# Patient Record
Sex: Male | Born: 1940 | Race: White | Hispanic: No | Marital: Married | Smoking: Former smoker
Health system: Southern US, Community
[De-identification: ages and names within clinical notes are randomized; demographics above are authoritative.]

## PROBLEM LIST (undated history)

## (undated) DIAGNOSIS — R7303 Prediabetes: Secondary | ICD-10-CM

## (undated) DIAGNOSIS — L723 Sebaceous cyst: Secondary | ICD-10-CM

## (undated) DIAGNOSIS — F0280 Dementia in other diseases classified elsewhere without behavioral disturbance: Secondary | ICD-10-CM

## (undated) DIAGNOSIS — I5189 Other ill-defined heart diseases: Secondary | ICD-10-CM

## (undated) DIAGNOSIS — Z7901 Long term (current) use of anticoagulants: Secondary | ICD-10-CM

## (undated) DIAGNOSIS — G20A1 Parkinson's disease without dyskinesia, without mention of fluctuations: Secondary | ICD-10-CM

## (undated) DIAGNOSIS — D3500 Benign neoplasm of unspecified adrenal gland: Secondary | ICD-10-CM

## (undated) DIAGNOSIS — I4729 Other ventricular tachycardia: Secondary | ICD-10-CM

## (undated) DIAGNOSIS — K828 Other specified diseases of gallbladder: Secondary | ICD-10-CM

## (undated) DIAGNOSIS — N400 Enlarged prostate without lower urinary tract symptoms: Secondary | ICD-10-CM

## (undated) DIAGNOSIS — I951 Orthostatic hypotension: Secondary | ICD-10-CM

## (undated) DIAGNOSIS — R42 Dizziness and giddiness: Secondary | ICD-10-CM

## (undated) DIAGNOSIS — R001 Bradycardia, unspecified: Secondary | ICD-10-CM

## (undated) DIAGNOSIS — I499 Cardiac arrhythmia, unspecified: Secondary | ICD-10-CM

## (undated) DIAGNOSIS — K760 Fatty (change of) liver, not elsewhere classified: Secondary | ICD-10-CM

## (undated) DIAGNOSIS — I447 Left bundle-branch block, unspecified: Secondary | ICD-10-CM

## (undated) DIAGNOSIS — I441 Atrioventricular block, second degree: Secondary | ICD-10-CM

## (undated) DIAGNOSIS — F0150 Vascular dementia without behavioral disturbance: Secondary | ICD-10-CM

## (undated) DIAGNOSIS — G20 Parkinson's disease: Secondary | ICD-10-CM

## (undated) DIAGNOSIS — I10 Essential (primary) hypertension: Secondary | ICD-10-CM

## (undated) DIAGNOSIS — G3184 Mild cognitive impairment, so stated: Secondary | ICD-10-CM

## (undated) DIAGNOSIS — E538 Deficiency of other specified B group vitamins: Secondary | ICD-10-CM

## (undated) DIAGNOSIS — I2510 Atherosclerotic heart disease of native coronary artery without angina pectoris: Secondary | ICD-10-CM

## (undated) DIAGNOSIS — R918 Other nonspecific abnormal finding of lung field: Secondary | ICD-10-CM

## (undated) DIAGNOSIS — J45909 Unspecified asthma, uncomplicated: Secondary | ICD-10-CM

## (undated) DIAGNOSIS — I4891 Unspecified atrial fibrillation: Secondary | ICD-10-CM

## (undated) DIAGNOSIS — I700 Atherosclerosis of aorta: Secondary | ICD-10-CM

## (undated) DIAGNOSIS — G629 Polyneuropathy, unspecified: Secondary | ICD-10-CM

## (undated) DIAGNOSIS — I472 Ventricular tachycardia: Secondary | ICD-10-CM

## (undated) DIAGNOSIS — E785 Hyperlipidemia, unspecified: Secondary | ICD-10-CM

## (undated) DIAGNOSIS — G2581 Restless legs syndrome: Secondary | ICD-10-CM

## (undated) DIAGNOSIS — F329 Major depressive disorder, single episode, unspecified: Secondary | ICD-10-CM

## (undated) DIAGNOSIS — Z8614 Personal history of Methicillin resistant Staphylococcus aureus infection: Secondary | ICD-10-CM

## (undated) DIAGNOSIS — Z972 Presence of dental prosthetic device (complete) (partial): Secondary | ICD-10-CM

## (undated) DIAGNOSIS — I6523 Occlusion and stenosis of bilateral carotid arteries: Secondary | ICD-10-CM

## (undated) DIAGNOSIS — Z9181 History of falling: Secondary | ICD-10-CM

## (undated) DIAGNOSIS — D649 Anemia, unspecified: Secondary | ICD-10-CM

## (undated) DIAGNOSIS — Z974 Presence of external hearing-aid: Secondary | ICD-10-CM

## (undated) DIAGNOSIS — M1990 Unspecified osteoarthritis, unspecified site: Secondary | ICD-10-CM

## (undated) DIAGNOSIS — F32A Depression, unspecified: Secondary | ICD-10-CM

## (undated) HISTORY — DX: Atherosclerotic heart disease of native coronary artery without angina pectoris: I25.10

## (undated) HISTORY — DX: Other ventricular tachycardia: I47.29

## (undated) HISTORY — DX: Bradycardia, unspecified: R00.1

## (undated) HISTORY — DX: Long term (current) use of anticoagulants: Z79.01

## (undated) HISTORY — DX: Other specified diseases of gallbladder: K82.8

## (undated) HISTORY — DX: Other ill-defined heart diseases: I51.89

## (undated) HISTORY — DX: Dizziness and giddiness: R42

## (undated) HISTORY — DX: Ventricular tachycardia: I47.2

## (undated) HISTORY — DX: Dementia in other diseases classified elsewhere, unspecified severity, without behavioral disturbance, psychotic disturbance, mood disturbance, and anxiety: F02.80

## (undated) HISTORY — DX: Hyperlipidemia, unspecified: E78.5

## (undated) HISTORY — DX: Left bundle-branch block, unspecified: I44.7

## (undated) HISTORY — DX: Personal history of Methicillin resistant Staphylococcus aureus infection: Z86.14

## (undated) HISTORY — DX: Cardiac arrhythmia, unspecified: I49.9

## (undated) HISTORY — DX: Orthostatic hypotension: I95.1

## (undated) HISTORY — DX: Benign prostatic hyperplasia without lower urinary tract symptoms: N40.0

## (undated) HISTORY — DX: Occlusion and stenosis of bilateral carotid arteries: I65.23

## (undated) HISTORY — DX: Sebaceous cyst: L72.3

## (undated) HISTORY — DX: Essential (primary) hypertension: I10

## (undated) HISTORY — DX: Benign neoplasm of unspecified adrenal gland: D35.00

---
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
## 2005-08-22 ENCOUNTER — Ambulatory Visit: Payer: Self-pay | Admitting: Family Medicine

## 2006-08-24 ENCOUNTER — Ambulatory Visit: Payer: Self-pay | Admitting: Internal Medicine

## 2006-09-05 ENCOUNTER — Ambulatory Visit: Payer: Self-pay | Admitting: Internal Medicine

## 2006-12-26 HISTORY — PX: COLONOSCOPY: SHX174

## 2007-05-25 ENCOUNTER — Ambulatory Visit: Payer: Self-pay | Admitting: Internal Medicine

## 2007-06-07 ENCOUNTER — Ambulatory Visit: Payer: Self-pay | Admitting: Internal Medicine

## 2007-06-07 ENCOUNTER — Encounter: Payer: Self-pay | Admitting: Internal Medicine

## 2011-05-13 NOTE — Assessment & Plan Note (Signed)
[facility]                           GASTROENTEROLOGY OFFICE NOTE   NAME:HARRISLew, Prout                          MRN:          [id number]  DATE:08/24/2006                            DOB:          25-Jun-1941    HISTORY:  Mr. Prestia is a very nice 70 year old gentleman who is here today  to discuss colorectal screening. He is asymptomatic. He has a positive  family history of colon cancer in the paternal grandfather who had the  disease at age of 47. He, himself, is having regular bowel habits and denies  any rectal bleeding.  On occasion, he has rectal irritation which he  attributes to hemorrhoids.  He also has had some leakage from the rectum at  times when his rectum is irritated.   MEDICATIONS:  1. Atenolol 25 mg p.o. b.i.d.  2. Advicor 50 mg p.o. q.h.s.   PAST MEDICAL HISTORY:  Heart rhythm problems.   FAMILY HISTORY:  Heart disease in father.   SOCIAL HISTORY:  Married, two children. He is a Engineer, maintenance (IT), works in  Insurance account manager. The patient drinks alcohol socially but does not smoke.   REVIEW OF SYSTEMS:  Positive for allergies and also he complains of hearing  problems.   PHYSICAL EXAMINATION:  VITAL SIGNS:  Blood pressure 98/68, pulse 60 and  weight 237 pounds.  GENERAL:  He was alert, oriented in no distress.  HEENT:  Sclerae is nonicteric.  Oral cavity normal.  NECK:  Neck was supple without adenopathy.  LUNGS:  Clear to auscultation.  CARDIAC:  Normal S1, S2.  ABDOMEN:  Soft, nontender with normoactive bowel sounds at costal margin.  No tenderness or fullness.  RECTAL:  Rectal exam showed normal perianal area.  Rectal tone was normal.  There was no tenderness or pain on digital exam. Stool was brown, trace  hemoccult positive. The patient had some irritation within the rectal  ampulla on digital exam but I could not feel any mass.   IMPRESSION:  A 70 year old white male who has trace heme positive stool  which he claims is  from the rectal irritation. He has never had a  colonoscopy. There is some indirect relatives with colon cancer,  specifically his grandfather. I suggested that he have a screening  colonoscopy. The patient's insurance, which is [employer],  currently has a deductible of $5000.00. He prefers to do his colonoscopy  when he becomes Medicare which will be in April, 2008. He would like to wait  with his colonoscopy until next April.   I have discussed the significance of heme positive stool with the patient.  Since he is asymptomatic, we are going to go ahead and obtain stool  hemoccult.  I have given him Analpram cream to use for his rectum to  minimize the irritation. If his hemoccult's are positive, we will go ahead  with colonoscopy immediately. If his hemoccult's are negative, he has an  option of waiting until his Medicare age, which would be in April, 2008. He  is aware of the slight risk of waiting, but he feels that  it would be more  cost effective. I have put his name in the computer to obtain a recall  letter for April, 2008, just in case he does not return his hemoccult card.                                   Hedwig Morton. Juanda Chance, MD   DMB/MedQ  DD:  08/24/2006  DT:  08/24/2006  Job #:  [id number]

## 2012-03-08 ENCOUNTER — Encounter: Payer: Self-pay | Admitting: Internal Medicine

## 2012-05-02 ENCOUNTER — Encounter: Payer: Self-pay | Admitting: Internal Medicine

## 2012-06-22 ENCOUNTER — Encounter: Payer: Self-pay | Admitting: Internal Medicine

## 2012-06-22 ENCOUNTER — Ambulatory Visit (AMBULATORY_SURGERY_CENTER): Payer: Medicare Other | Admitting: *Deleted

## 2012-06-22 VITALS — Ht 70.0 in | Wt 228.7 lb

## 2012-06-22 DIAGNOSIS — Z1211 Encounter for screening for malignant neoplasm of colon: Secondary | ICD-10-CM

## 2012-06-22 MED ORDER — MOVIPREP 100 G PO SOLR: ORAL | Status: DC

## 2012-07-06 ENCOUNTER — Ambulatory Visit (AMBULATORY_SURGERY_CENTER): Payer: Medicare Other | Admitting: Internal Medicine

## 2012-07-06 ENCOUNTER — Encounter: Payer: Self-pay | Admitting: Internal Medicine

## 2012-07-06 VITALS — BP 112/55 | HR 52 | Temp 97.1°F | Resp 18 | Ht 70.0 in | Wt 228.0 lb

## 2012-07-06 DIAGNOSIS — D126 Benign neoplasm of colon, unspecified: Secondary | ICD-10-CM

## 2012-07-06 DIAGNOSIS — K621 Rectal polyp: Secondary | ICD-10-CM

## 2012-07-06 DIAGNOSIS — Z1211 Encounter for screening for malignant neoplasm of colon: Secondary | ICD-10-CM

## 2012-07-06 DIAGNOSIS — K620 Anal polyp: Secondary | ICD-10-CM

## 2012-07-06 DIAGNOSIS — Z860100 Personal history of colon polyps, unspecified: Secondary | ICD-10-CM

## 2012-07-06 MED ORDER — SODIUM CHLORIDE 0.9 % IV SOLN: 500.0000 mL | INTRAVENOUS | Status: DC

## 2012-07-06 NOTE — Progress Notes (Signed)
Patient did not experience any of the following events: a burn prior to discharge; a fall within the facility; wrong site/side/patient/procedure/implant event; or a hospital transfer or hospital admission upon discharge from the facility. (G8907) Patient did not have preoperative order for IV antibiotic SSI prophylaxis. (G8918)  

## 2012-07-06 NOTE — Progress Notes (Signed)
Propofol per j nulty crna. See scanned intra procedure report. ewm 

## 2012-07-06 NOTE — Patient Instructions (Addendum)
DISCHARGE INSTRUCTIONS GIVEN WITH VERBAL UNDERSTANDING. HANDOUT ON POLYPS GIVEN. RESUME PREVIOUS MEDICATIONS. YOU HAD AN ENDOSCOPIC PROCEDURE TODAY AT THE [facility]: Refer to the procedure report that was given to you for any specific questions about what was found during the examination.  If the procedure report does not answer your questions, please call your gastroenterologist to clarify.  If you requested that your care partner not be given the details of your procedure findings, then the procedure report has been included in a sealed envelope for you to review at your convenience later.  YOU SHOULD EXPECT: Some feelings of bloating in the abdomen. Passage of more gas than usual.  Walking can help get rid of the air that was put into your GI tract during the procedure and reduce the bloating. If you had a lower endoscopy (such as a colonoscopy or flexible sigmoidoscopy) you may notice spotting of blood in your stool or on the toilet paper. If you underwent a bowel prep for your procedure, then you may not have a normal bowel movement for a few days.  DIET: Your first meal following the procedure should be a light meal and then it is ok to progress to your normal diet.  A half-sandwich or bowl of soup is an example of a good first meal.  Heavy or fried foods are harder to digest and may make you feel nauseous or bloated.  Likewise meals heavy in dairy and vegetables can cause extra gas to form and this can also increase the bloating.  Drink plenty of fluids but you should avoid alcoholic beverages for 24 hours.  ACTIVITY: Your care partner should take you home directly after the procedure.  You should plan to take it easy, moving slowly for the rest of the day.  You can resume normal activity the day after the procedure however you should NOT DRIVE or use heavy machinery for 24 hours (because of the sedation medicines used during the test).    SYMPTOMS TO REPORT IMMEDIATELY: A  gastroenterologist can be reached at any hour.  During normal business hours, 8:30 AM to 5:00 PM Monday through Friday, call [phone].  After hours and on weekends, please call the GI answering service at [phone] who will take a message and have the physician on call contact you.   Following lower endoscopy (colonoscopy or flexible sigmoidoscopy):  Excessive amounts of blood in the stool  Significant tenderness or worsening of abdominal pains  Swelling of the abdomen that is new, acute  Fever of 100F or higher  FOLLOW UP: If any biopsies were taken you will be contacted by phone or by letter within the next 1-3 weeks.  Call your gastroenterologist if you have not heard about the biopsies in 3 weeks.  Our staff will call the home number listed on your records the next business day following your procedure to check on you and address any questions or concerns that you may have at that time regarding the information given to you following your procedure. This is a courtesy call and so if there is no answer at the home number and we have not heard from you through the emergency physician on call, we will assume that you have returned to your regular daily activities without incident.  SIGNATURES/CONFIDENTIALITY: You and/or your care partner have signed paperwork which will be entered into your electronic medical record.  These signatures attest to the fact that that the information above on your After Visit Summary has  been reviewed and is understood.  Full responsibility of the confidentiality of this discharge information lies with you and/or your care-partner.  

## 2012-07-06 NOTE — Op Note (Signed)
[facility] [address] [place], [place]  [address]  COLONOSCOPY PROCEDURE REPORT  PATIENT:  Stephen Stein, Stephen Stein  MR#:  [id number] BIRTHDATE:  [birth year], 71 yrs. old  GENDER:  male ENDOSCOPIST:  Hedwig Morton. Juanda Chance, MD REF. BY:  Dennison Mascot, M.D. PROCEDURE DATE:  07/06/2012 PROCEDURE:  Colonoscopy with biopsy and snare polypectomy ASA CLASS:  Class II INDICATIONS:  history of pre-cancerous (adenomatous) colon polyps adenomatous polyp 2008 MEDICATIONS:   MAC sedation, administered by CRNA, propofol (Diprivan) 300 mg  DESCRIPTION OF PROCEDURE:   After the risks and benefits and of the procedure were explained, informed consent was obtained. Digital rectal exam was performed and revealed no rectal masses. The LB [id number] endoscope was introduced through the anus and advanced to the cecum, which was identified by both the appendix and ileocecal valve.  The quality of the prep was good, using MoviPrep.  The instrument was then slowly withdrawn as the colon was fully examined.  FINDINGS:  Two polyps were found in the sigmoid colon. 5 mm polyp at 60 cm and 3 mm polyp in the rectum The polyp was removed using cold biopsy forceps. Polyp was snared without cautery. Retrieval was successful. snare polyp   Retroflexed views in the rectum revealed no abnormalities.    The scope was then withdrawn from the patient and the procedure completed.  COMPLICATIONS:  None ENDOSCOPIC IMPRESSION: 1) Two polyps in the sigmoid colon RECOMMENDATIONS: 1) Await pathology results 2) High fiber diet.  REPEAT EXAM:  In 10 year(s) for.  ______________________________ Hedwig Morton. Juanda Chance, MD  CC:  n. eSIGNED:   Hedwig Morton. Edras Wilford at 07/06/2012 11:52 AM  Lara Mulch, [phone]

## 2012-07-09 ENCOUNTER — Telehealth: Payer: Self-pay | Admitting: *Deleted

## 2012-07-09 NOTE — Telephone Encounter (Signed)
  Follow up Call-  Call back number 07/06/2012  Post procedure Call Back phone  # [phone]  Permission to leave phone message Yes     Patient questions:  Do you have a fever, pain , or abdominal swelling? no Pain Score  0 *  Have you tolerated food without any problems? yes  Have you been able to return to your normal activities? yes  Do you have any questions about your discharge instructions: Diet   no Medications  no Follow up visit  no  Do you have questions or concerns about your Care? no  Actions: * If pain score is 4 or above: No action needed, pain <4.  Pt was unavailable.  Spoke with wife Stefano Trulson.

## 2012-07-10 ENCOUNTER — Encounter: Payer: Self-pay | Admitting: Internal Medicine

## 2014-08-19 ENCOUNTER — Ambulatory Visit: Payer: Self-pay | Admitting: Family Medicine

## 2015-04-08 ENCOUNTER — Emergency Department: Admit: 2015-04-08 | Disposition: A | Discharge status: Home / Self Care | Payer: Self-pay | Admitting: Internal Medicine

## 2015-06-15 ENCOUNTER — Telehealth: Payer: Self-pay | Admitting: Family Medicine

## 2015-06-15 NOTE — Telephone Encounter (Signed)
PT IS IN [place] AT THIS TIME AND WILL NOT BE BACK TILL July AND HE IS IN NEED OF A REFILL FOR HIS ATENOLOL. HE HAS AN APPT ON July (THE FIRST WEEK HE THINKS) THIS NEEDS TO BE CALLED IN TO THE [facility] IN [place] AND THE NUMBER IS [phone].

## 2015-06-16 NOTE — Telephone Encounter (Signed)
Called in 21 day supply to [facility] to last pt until his appt on 07/07/15. Please call and inform pt.

## 2015-06-16 NOTE — Telephone Encounter (Signed)
LFT MESS THAT RX HAS BEEN CALLED IN.

## 2015-07-07 ENCOUNTER — Ambulatory Visit (INDEPENDENT_AMBULATORY_CARE_PROVIDER_SITE_OTHER): Payer: Commercial Managed Care - HMO | Admitting: Family Medicine

## 2015-07-07 ENCOUNTER — Encounter: Payer: Self-pay | Admitting: Family Medicine

## 2015-07-07 VITALS — BP 110/60 | HR 58 | Temp 97.7°F | Resp 18 | Ht 70.0 in | Wt 209.4 lb

## 2015-07-07 DIAGNOSIS — I10 Essential (primary) hypertension: Secondary | ICD-10-CM | POA: Diagnosis not present

## 2015-07-07 DIAGNOSIS — R739 Hyperglycemia, unspecified: Secondary | ICD-10-CM | POA: Insufficient documentation

## 2015-07-07 DIAGNOSIS — E785 Hyperlipidemia, unspecified: Secondary | ICD-10-CM | POA: Insufficient documentation

## 2015-07-07 DIAGNOSIS — E669 Obesity, unspecified: Secondary | ICD-10-CM

## 2015-07-07 LAB — POCT GLYCOSYLATED HEMOGLOBIN (HGB A1C): HEMOGLOBIN A1C: 5.6

## 2015-07-07 LAB — GLUCOSE, POCT (MANUAL RESULT ENTRY): POC Glucose: 92 mg/dl (ref 70–99)

## 2015-07-07 MED ORDER — LOVASTATIN 20 MG PO TABS: 20.0000 mg | ORAL_TABLET | Freq: Every day | ORAL | Status: DC

## 2015-07-07 MED ORDER — ATENOLOL 25 MG PO TABS: 25.0000 mg | ORAL_TABLET | Freq: Two times a day (BID) | ORAL | Status: DC

## 2015-07-07 NOTE — Progress Notes (Signed)
Name: Stephen Stein   MRN: [id number]    DOB: 1941-11-21   Date:07/07/2015       Progress Note  Subjective  Chief Complaint  Chief Complaint  Patient presents with  . Hypertension  . Hyperlipidemia    Hypertension This is a chronic problem. The current episode started more than 1 year ago. The problem is unchanged. The problem is controlled. Associated symptoms include anxiety. Pertinent negatives include no blurred vision, chest pain, headaches, neck pain, orthopnea, palpitations or shortness of breath. There are no associated agents to hypertension. Risk factors for coronary artery disease include dyslipidemia, male gender and obesity. Past treatments include beta blockers. The current treatment provides moderate improvement.  Hyperlipidemia This is a chronic problem. The current episode started more than 1 year ago. The problem is controlled. Recent lipid tests were reviewed and are normal. Exacerbating diseases include obesity. Factors aggravating his hyperlipidemia include fatty foods. Pertinent negatives include no chest pain, focal weakness, myalgias or shortness of breath. Current antihyperlipidemic treatment includes statins and herbal therapy. The current treatment provides moderate improvement of lipids. There are no compliance problems.  Risk factors for coronary artery disease include dyslipidemia, hypertension, male sex and obesity.   Hyperglycemia  Patient has a history of elevated glucose at 108 on 11/15. A1c is slightly elevated at 5.6 the past. Currently no polyuria polydipsia polyphagia he is doing some exercising but is not particularly following  an ADA diet  Osteoarthritis  subjective complaint of arthritic pain in several joints. Seizures and renal improvement.  Past Medical History  Diagnosis Date  . Irregular heartbeat     been taking Atenolol for 20 years  . BPH (benign prostatic hyperplasia)   . Hyperlipidemia   . Hypertension     History  Substance Use  Topics  . Smoking status: Former Smoker    Quit date: 06/22/1992  . Smokeless tobacco: Former Systems developer    Quit date: 06/22/1992  . Alcohol Use: 0.0 oz/week    0 Standard drinks or equivalent per week     Comment: occasional     Current outpatient prescriptions:  .  aspirin 81 MG tablet, Take 81 mg by mouth daily., Disp: , Rfl:  .  atenolol (TENORMIN) 25 MG tablet, Take 1 tablet (25 mg total) by mouth 2 (two) times daily., Disp: 90 tablet, Rfl: 1 .  fish oil-omega-3 fatty acids 1000 MG capsule, Take 1 g by mouth daily., Disp: , Rfl:  .  lovastatin (MEVACOR) 20 MG tablet, Take 1 tablet (20 mg total) by mouth at bedtime., Disp: 90 tablet, Rfl: 1 .  Multiple Vitamin (MULTIVITAMIN) tablet, Take 1 tablet by mouth daily., Disp: , Rfl:  .  Saw Palmetto, Serenoa repens, (SAW PALMETTO PO), Take by mouth daily., Disp: , Rfl:   No Known Allergies  Review of Systems  Constitutional: Negative for fever, chills and weight loss.  HENT: Negative for congestion, hearing loss, sore throat and tinnitus.   Eyes: Negative for blurred vision, double vision and redness.  Respiratory: Negative for cough, hemoptysis and shortness of breath.   Cardiovascular: Negative for chest pain, palpitations, orthopnea, claudication and leg swelling.  Gastrointestinal: Negative for heartburn, nausea, vomiting, diarrhea, constipation and blood in stool.  Genitourinary: Negative for dysuria, urgency, frequency and hematuria.  Musculoskeletal: Positive for joint pain. Negative for myalgias, back pain, falls and neck pain.  Skin: Negative for itching.  Neurological: Negative for dizziness, tingling, tremors, focal weakness, seizures, loss of consciousness, weakness and headaches.  Endo/Heme/Allergies: Does not  bruise/bleed easily.  Psychiatric/Behavioral: Negative for depression and substance abuse. The patient is not nervous/anxious and does not have insomnia.      Objective  Filed Vitals:   07/07/15 0739  BP: 110/60   Pulse: 58  Temp: 97.7 F (36.5 C)  TempSrc: Oral  Resp: 18  Height: 5\' 10"  (1.778 m)  Weight: 209 lb 6.4 oz (94.983 kg)  SpO2: 97%     Physical Exam  Constitutional: He is oriented to person, place, and time and well-developed, well-nourished, and in no distress.  HENT:  Head: Normocephalic.  Eyes: EOM are normal. Pupils are equal, round, and reactive to light.  Neck: Normal range of motion. Neck supple. No thyromegaly present.  Cardiovascular: Normal rate, regular rhythm and normal heart sounds.   No murmur heard. Pulmonary/Chest: Effort normal and breath sounds normal. No respiratory distress. He has no wheezes.  Abdominal: Soft. Bowel sounds are normal.  Musculoskeletal: Normal range of motion. He exhibits no edema.  Lymphadenopathy:    He has no cervical adenopathy.  Neurological: He is alert and oriented to person, place, and time. No cranial nerve deficit. Gait normal. Coordination normal.  Skin: Skin is warm and dry. No rash noted.  Psychiatric: Affect and judgment normal.      Assessment & Plan  1. Hyperlipemia Lipid panel today stable - lovastatin (MEVACOR) 20 MG tablet; Take 1 tablet (20 mg total) by mouth at bedtime.  Dispense: 90 tablet; Refill: 1 - Lipid panel  2. Essential hypertension  - atenolol (TENORMIN) 25 MG tablet; Take 1 tablet (25 mg total) by mouth 2 (two) times daily.  Dispense: 90 tablet; Refill: 1 - Comprehensive metabolic panel  3. Hyperglycemia Encourage low-carb diet including protein - POCT glucose (manual entry) - POCT glycosylated hemoglobin (Hb A1C) - Comprehensive metabolic panel  4. Obesity Encourage regular exercise and low-carb diet with lean protein - TSH

## 2015-07-07 NOTE — Patient Instructions (Signed)

## 2015-07-08 LAB — LIPID PANEL
CHOLESTEROL TOTAL: 137 mg/dL (ref 100–199)
Chol/HDL Ratio: 3.6 ratio units (ref 0.0–5.0)
HDL: 38 mg/dL — AB (ref >39)
LDL CALC: 82 mg/dL (ref 0–99)
Triglycerides: 85 mg/dL (ref 0–149)
VLDL CHOLESTEROL CAL: 17 mg/dL (ref 5–40)

## 2015-07-08 LAB — COMPREHENSIVE METABOLIC PANEL
A/G RATIO: 1.6 (ref 1.1–2.5)
ALT: 27 IU/L (ref 0–44)
AST: 30 IU/L (ref 0–40)
Albumin: 4.4 g/dL (ref 3.5–4.8)
Alkaline Phosphatase: 24 IU/L — ABNORMAL LOW (ref 39–117)
BUN / CREAT RATIO: 14 (ref 10–22)
BUN: 16 mg/dL (ref 8–27)
Bilirubin Total: 0.4 mg/dL (ref 0.0–1.2)
CALCIUM: 9.6 mg/dL (ref 8.6–10.2)
CHLORIDE: 100 mmol/L (ref 97–108)
CO2: 22 mmol/L (ref 18–29)
CREATININE: 1.13 mg/dL (ref 0.76–1.27)
GFR calc Af Amer: 74 mL/min/{1.73_m2} (ref >59)
GFR calc non Af Amer: 64 mL/min/{1.73_m2} (ref >59)
GLUCOSE: 110 mg/dL — AB (ref 65–99)
Globulin, Total: 2.7 g/dL (ref 1.5–4.5)
Potassium: 4.9 mmol/L (ref 3.5–5.2)
Sodium: 140 mmol/L (ref 134–144)
TOTAL PROTEIN: 7.1 g/dL (ref 6.0–8.5)

## 2015-07-08 LAB — TSH: TSH: 3.45 u[IU]/mL (ref 0.450–4.500)

## 2015-07-10 ENCOUNTER — Telehealth: Payer: Self-pay | Admitting: Emergency Medicine

## 2015-07-10 NOTE — Telephone Encounter (Signed)
Patient notified of lab results

## 2015-10-02 ENCOUNTER — Telehealth: Payer: Self-pay | Admitting: Family Medicine

## 2015-10-02 NOTE — Telephone Encounter (Signed)
Patient stated that he only received 90 tablets of the atenolol when he was here on 07/07/15 and he takes 2 tablets twice a day. Pt states that he didn't receive the correct amount and is requesting a refill.  His next appointment is on 12/14/15.  Patient uses the [facility] on [address].  Please contact patient once complete.

## 2015-10-04 ENCOUNTER — Other Ambulatory Visit: Payer: Self-pay | Admitting: Family Medicine

## 2015-10-06 NOTE — Telephone Encounter (Signed)
done

## 2015-10-21 ENCOUNTER — Telehealth: Payer: Self-pay | Admitting: Family Medicine

## 2015-10-21 NOTE — Telephone Encounter (Signed)
ERRENOUS °

## 2015-12-14 ENCOUNTER — Ambulatory Visit (INDEPENDENT_AMBULATORY_CARE_PROVIDER_SITE_OTHER): Payer: Commercial Managed Care - HMO | Admitting: Family Medicine

## 2015-12-14 ENCOUNTER — Ambulatory Visit
Admission: RE | Admit: 2015-12-14 | Discharge: 2015-12-14 | Disposition: A | Discharge status: Home / Self Care | Payer: Commercial Managed Care - HMO | Source: Ambulatory Visit | Attending: Family Medicine | Admitting: Family Medicine

## 2015-12-14 ENCOUNTER — Telehealth: Payer: Self-pay | Admitting: *Deleted

## 2015-12-14 ENCOUNTER — Encounter: Payer: Self-pay | Admitting: Family Medicine

## 2015-12-14 VITALS — BP 118/72 | HR 56 | Temp 98.0°F | Resp 18 | Ht 70.0 in | Wt 220.1 lb

## 2015-12-14 DIAGNOSIS — M25511 Pain in right shoulder: Secondary | ICD-10-CM | POA: Diagnosis present

## 2015-12-14 DIAGNOSIS — E785 Hyperlipidemia, unspecified: Secondary | ICD-10-CM

## 2015-12-14 DIAGNOSIS — R739 Hyperglycemia, unspecified: Secondary | ICD-10-CM | POA: Diagnosis not present

## 2015-12-14 DIAGNOSIS — R002 Palpitations: Secondary | ICD-10-CM | POA: Diagnosis not present

## 2015-12-14 DIAGNOSIS — M8580 Other specified disorders of bone density and structure, unspecified site: Secondary | ICD-10-CM | POA: Diagnosis not present

## 2015-12-14 DIAGNOSIS — M25512 Pain in left shoulder: Secondary | ICD-10-CM

## 2015-12-14 DIAGNOSIS — M19011 Primary osteoarthritis, right shoulder: Secondary | ICD-10-CM | POA: Diagnosis not present

## 2015-12-14 DIAGNOSIS — M19012 Primary osteoarthritis, left shoulder: Secondary | ICD-10-CM | POA: Diagnosis not present

## 2015-12-14 LAB — GLUCOSE, POCT (MANUAL RESULT ENTRY): POC Glucose: 101 mg/dl — AB (ref 70–99)

## 2015-12-14 LAB — POCT GLYCOSYLATED HEMOGLOBIN (HGB A1C): HEMOGLOBIN A1C: 5.6

## 2015-12-14 MED ORDER — MELOXICAM 15 MG PO TABS: 15.0000 mg | ORAL_TABLET | Freq: Every day | ORAL | Status: DC

## 2015-12-14 MED ORDER — ATENOLOL 25 MG PO TABS: 25.0000 mg | ORAL_TABLET | Freq: Two times a day (BID) | ORAL | Status: DC

## 2015-12-14 MED ORDER — LOVASTATIN 20 MG PO TABS: 20.0000 mg | ORAL_TABLET | Freq: Every day | ORAL | Status: DC

## 2015-12-14 NOTE — Patient Instructions (Signed)
Hyperglycemia Hyperglycemia occurs when the glucose (sugar) in your blood is too high. Hyperglycemia can happen for many reasons, but it most often happens to people who do not know they have diabetes or are not managing their diabetes properly.  CAUSES  Whether you have diabetes or not, there are other causes of hyperglycemia. Hyperglycemia can occur when you have diabetes, but it can also occur in other situations that you might not be as aware of, such as: Diabetes  If you have diabetes and are having problems controlling your blood glucose, hyperglycemia could occur because of some of the following reasons:  Not following your meal plan.  Not taking your diabetes medications or not taking it properly.  Exercising less or doing less activity than you normally do.  Being sick. Pre-diabetes  This cannot be ignored. Before people develop Type 2 diabetes, they almost always have "pre-diabetes." This is when your blood glucose levels are higher than normal, but not yet high enough to be diagnosed as diabetes. Research has shown that some long-term damage to the body, especially the heart and circulatory system, may already be occurring during pre-diabetes. If you take action to manage your blood glucose when you have pre-diabetes, you may delay or prevent Type 2 diabetes from developing. Stress  If you have diabetes, you may be "diet" controlled or on oral medications or insulin to control your diabetes. However, you may find that your blood glucose is higher than usual in the hospital whether you have diabetes or not. This is often referred to as "stress hyperglycemia." Stress can elevate your blood glucose. This happens because of hormones put out by the body during times of stress. If stress has been the cause of your high blood glucose, it can be followed regularly by your caregiver. That way he/she can make sure your hyperglycemia does not continue to get worse or progress to  diabetes. Steroids  Steroids are medications that act on the infection fighting system (immune system) to block inflammation or infection. One side effect can be a rise in blood glucose. Most people can produce enough extra insulin to allow for this rise, but for those who cannot, steroids make blood glucose levels go even higher. It is not unusual for steroid treatments to "uncover" diabetes that is developing. It is not always possible to determine if the hyperglycemia will go away after the steroids are stopped. A special blood test called an A1c is sometimes done to determine if your blood glucose was elevated before the steroids were started. SYMPTOMS  Thirsty.  Frequent urination.  Dry mouth.  Blurred vision.  Tired or fatigue.  Weakness.  Sleepy.  Tingling in feet or leg. DIAGNOSIS  Diagnosis is made by monitoring blood glucose in one or all of the following ways:  A1c test. This is a chemical found in your blood.  Fingerstick blood glucose monitoring.  Laboratory results. TREATMENT  First, knowing the cause of the hyperglycemia is important before the hyperglycemia can be treated. Treatment may include, but is not be limited to:  Education.  Change or adjustment in medications.  Change or adjustment in meal plan.  Treatment for an illness, infection, etc.  More frequent blood glucose monitoring.  Change in exercise plan.  Decreasing or stopping steroids.  Lifestyle changes. HOME CARE INSTRUCTIONS   Test your blood glucose as directed.  Exercise regularly. Your caregiver will give you instructions about exercise. Pre-diabetes or diabetes which comes on with stress is helped by exercising.  Eat wholesome,   balanced meals. Eat often and at regular, fixed times. Your caregiver or nutritionist will give you a meal plan to guide your sugar intake.  Being at an ideal weight is important. If needed, losing as little as 10 to 15 pounds may help improve blood  glucose levels. SEEK MEDICAL CARE IF:   You have questions about medicine, activity, or diet.  You continue to have symptoms (problems such as increased thirst, urination, or weight gain). SEEK IMMEDIATE MEDICAL CARE IF:   You are vomiting or have diarrhea.  Your breath smells fruity.  You are breathing faster or slower.  You are very sleepy or incoherent.  You have numbness, tingling, or pain in your feet or hands.  You have chest pain.  Your symptoms get worse even though you have been following your caregiver's orders.  If you have any other questions or concerns.   This information is not intended to replace advice given to you by your health care provider. Make sure you discuss any questions you have with your health care provider.   Document Released: 06/07/2001 Document Revised: 03/05/2012 Document Reviewed: 08/18/2015 Elsevier Interactive Patient Education 2016 [employer]. Obesity Obesity is defined as having too much total body fat and a body mass index (BMI) of 30 or more. BMI is an estimate of body fat and is calculated from your height and weight. BMI is typically calculated by your health care provider during regular wellness visits. Obesity happens when you consume more calories than you can burn by exercising or performing daily physical tasks. Prolonged obesity can cause major illnesses or emergencies, such as:  Stroke.  Heart disease.  Diabetes.  Cancer.  Arthritis.  High blood pressure (hypertension).  High cholesterol.  Sleep apnea.  Erectile dysfunction.  Infertility problems. CAUSES   Regularly eating unhealthy foods.  Physical inactivity.  Certain disorders, such as an underactive thyroid (hypothyroidism), Cushing's syndrome, and polycystic ovarian syndrome.  Certain medicines, such as steroids, some depression medicines, and antipsychotics.  Genetics.  Lack of sleep. DIAGNOSIS A health care provider can diagnose obesity after  calculating your BMI. Obesity will be diagnosed if your BMI is 30 or higher. There are other methods of measuring obesity levels. Some other methods include measuring your skinfold thickness, your waist circumference, and comparing your hip circumference to your waist circumference. TREATMENT  A healthy treatment program includes some or all of the following:  Long-term dietary changes.  Exercise and physical activity.  Behavioral and lifestyle changes.  Medicine only under the supervision of your health care provider. Medicines may help, but only if they are used with diet and exercise programs. If your BMI is 40 or higher, your health care provider may recommend specialized surgery or programs to help with weight loss. An unhealthy treatment program includes:  Fasting.  Fad diets.  Supplements and drugs. These choices do not succeed in long-term weight control. HOME CARE INSTRUCTIONS  Exercise and perform physical activity as directed by your health care provider. To increase physical activity, try the following:  Use stairs instead of elevators.  Park farther away from store entrances.  Garden, bike, or walk instead of watching television or using the computer.  Eat healthy, low-calorie foods and drinks on a regular basis. Eat more fruits and vegetables. Use low-calorie cookbooks or take healthy cooking classes.  Limit fast food, sweets, and processed snack foods.  Eat smaller portions.  Keep a daily journal of everything you eat. There are many free websites to help you with this.  It may be helpful to measure your foods so you can determine if you are eating the correct portion sizes.  Avoid drinking alcohol. Drink more water and drinks without calories.  Take vitamins and supplements only as recommended by your health care provider.  Weight-loss support groups, Tax adviser, counselors, and stress reduction education can also be very helpful. SEEK IMMEDIATE  MEDICAL CARE IF:  You have chest pain or tightness.  You have trouble breathing or feel short of breath.  You have weakness or leg numbness.  You feel confused or have trouble talking.  You have sudden changes in your vision.   This information is not intended to replace advice given to you by your health care provider. Make sure you discuss any questions you have with your health care provider.   Document Released: 01/19/2005 Document Revised: 01/02/2015 Document Reviewed: 01/18/2012 Elsevier Interactive Patient Education [employer].

## 2015-12-14 NOTE — Progress Notes (Signed)
Name: Stephen Stein   MRN: [id number]    DOB: December 20, 1941   Date:12/14/2015       Progress Note  Subjective  Chief Complaint  Chief Complaint  Patient presents with  . Hypertension    6 month recheck  . Hyperlipidemia    HPI  Hypertension   Patient presents for follow-up of hypertension. It has been present for over 5years.  Patient states that there is compliance with medical regimen which consists of ATENOLOL . There is no end organ disease. Cardiac risk factors include hypertension hyperlipidemia and diabetes.  Exercise regimen consist of LIMITED WALKING .  Diet consist of UNHEALTHT.  Hyperlipidemia  Patient has a history of hyperlipidemia for >5 years.  Current medical regimen consist of LOVASTATIN AND FISH OIL .  Compliance is GOOD .  Diet and exercise are currently followed POORLY.  Risk factors for cardiovascular disease include hyperlipidemia, OBESITY, HTN, SEDENTERY LIFESTYLE MALE> 50 .   There have been no side effects from the medication.    Hyperglycemia  Patient has a history of hyperglycemia with glucose 110 and an A1c of 5.66 months ago. No polyuria polydipsia polyphagia. He is obese but is never been diagnosed with diabetes.  Obesity  Patient has continued regular continuous weight gain of additional 11 pounds last visit. He has not been following his diet and exercising as frequently as he has in the past. No polyuria polydipsia polyphagia. He is not taking the following a low-fat low-cholesterol diet.  Bilateral shoulder pai  subjective okay Past Medical History  Diagnosis Date  . Irregular heartbeat     been taking Atenolol for 20 years  . BPH (benign prostatic hyperplasia)   . Hyperlipidemia   . Hypertension     Social History  Substance Use Topics  . Smoking status: Former Smoker    Quit date: 06/22/1992  . Smokeless tobacco: Former Systems developer    Quit date: 06/22/1992  . Alcohol Use: 0.0 oz/week    0 Standard drinks or equivalent per week     Comment:  occasional     Current outpatient prescriptions:  .  aspirin 81 MG tablet, Take 81 mg by mouth daily., Disp: , Rfl:  .  atenolol (TENORMIN) 25 MG tablet, Take 1 tablet (25 mg total) by mouth 2 (two) times daily., Disp: 180 tablet, Rfl: 1 .  fish oil-omega-3 fatty acids 1000 MG capsule, Take 1 g by mouth daily., Disp: , Rfl:  .  lovastatin (MEVACOR) 20 MG tablet, Take 1 tablet (20 mg total) by mouth at bedtime., Disp: 90 tablet, Rfl: 1 .  Multiple Vitamin (MULTIVITAMIN) tablet, Take 1 tablet by mouth daily., Disp: , Rfl:  .  Saw Palmetto, Serenoa repens, (SAW PALMETTO PO), Take by mouth daily., Disp: , Rfl:   No Known Allergies  Review of Systems  Constitutional: Negative for fever, chills and weight loss.  HENT: Negative for congestion, hearing loss, sore throat and tinnitus.   Eyes: Negative for blurred vision, double vision and redness.  Respiratory: Negative for cough, hemoptysis and shortness of breath.   Cardiovascular: Negative for chest pain, palpitations, orthopnea, claudication and leg swelling.  Gastrointestinal: Negative for heartburn, nausea, vomiting, diarrhea, constipation and blood in stool.  Genitourinary: Positive for frequency. Negative for dysuria, urgency and hematuria.  Musculoskeletal: Negative for myalgias, back pain, joint pain, falls and neck pain.  Skin: Negative for itching.  Neurological: Negative for dizziness, tingling, tremors, focal weakness, seizures, loss of consciousness, weakness and headaches.  Endo/Heme/Allergies: Does not bruise/bleed  easily.  Psychiatric/Behavioral: Negative for depression and substance abuse. The patient is not nervous/anxious and does not have insomnia.      Objective  Filed Vitals:   12/14/15 0737  BP: 118/72  Pulse: 56  Temp: 98 F (36.7 C)  TempSrc: Oral  Resp: 18  Height: 5\' 10"  (1.778 m)  Weight: 220 lb 1.6 oz (99.837 kg)  SpO2: 96%     Physical Exam  Constitutional: He is oriented to person, place, and  time and well-developed, well-nourished, and in no distress.  HENT:  Head: Normocephalic.  Eyes: EOM are normal. Pupils are equal, round, and reactive to light.  Neck: Normal range of motion. Neck supple. No thyromegaly present.  Cardiovascular: Normal rate, regular rhythm and normal heart sounds.   No murmur heard. Pulmonary/Chest: Effort normal and breath sounds normal. No respiratory distress. He has no wheezes.  Abdominal: Soft. Bowel sounds are normal.  Musculoskeletal: Normal range of motion. He exhibits no edema.  Lymphadenopathy:    He has no cervical adenopathy.  Neurological: He is alert and oriented to person, place, and time. No cranial nerve deficit. Gait normal. Coordination normal.  Skin: Skin is warm and dry. No rash noted.  Psychiatric: Affect and judgment normal.      Assessment & Plan  . 1. Hyperlipemia LABS - lovastatin (MEVACOR) 20 MG tablet; Take 1 tablet (20 mg total) by mouth at bedtime.  Dispense: 90 tablet; Refill: 1 - Lipid panel  2. Hyperglycemia - POCT Glucose (CBG) - POCT HgB A1C - Comprehensive Metabolic Panel (CMET)  3. Shoulder pain, bilateral NSIAIDS XRAYS - DG Shoulder Right; Future - DG Shoulder Left; Future  4. Palpitations EKG, LABS - Comprehensive Metabolic Panel (CMET) - TSH - EKG 12-Lead - Ambulatory referral to Cardiology

## 2015-12-14 NOTE — Telephone Encounter (Signed)
Error

## 2015-12-15 LAB — COMPREHENSIVE METABOLIC PANEL
ALBUMIN: 4.4 g/dL (ref 3.5–4.8)
ALK PHOS: 25 IU/L — AB (ref 39–117)
ALT: 28 IU/L (ref 0–44)
AST: 30 IU/L (ref 0–40)
Albumin/Globulin Ratio: 1.6 (ref 1.1–2.5)
BILIRUBIN TOTAL: 0.4 mg/dL (ref 0.0–1.2)
BUN / CREAT RATIO: 12 (ref 10–22)
BUN: 15 mg/dL (ref 8–27)
CO2: 24 mmol/L (ref 18–29)
CREATININE: 1.3 mg/dL — AB (ref 0.76–1.27)
Calcium: 9.4 mg/dL (ref 8.6–10.2)
Chloride: 99 mmol/L (ref 96–106)
GFR calc Af Amer: 63 mL/min/{1.73_m2} (ref >59)
GFR calc non Af Amer: 54 mL/min/{1.73_m2} — ABNORMAL LOW (ref >59)
GLOBULIN, TOTAL: 2.7 g/dL (ref 1.5–4.5)
Glucose: 107 mg/dL — ABNORMAL HIGH (ref 65–99)
Potassium: 4.9 mmol/L (ref 3.5–5.2)
SODIUM: 136 mmol/L (ref 134–144)
TOTAL PROTEIN: 7.1 g/dL (ref 6.0–8.5)

## 2015-12-15 LAB — LIPID PANEL
CHOL/HDL RATIO: 4.5 ratio (ref 0.0–5.0)
CHOLESTEROL TOTAL: 159 mg/dL (ref 100–199)
HDL: 35 mg/dL — ABNORMAL LOW (ref >39)
LDL Calculated: 106 mg/dL — ABNORMAL HIGH (ref 0–99)
Triglycerides: 89 mg/dL (ref 0–149)
VLDL Cholesterol Cal: 18 mg/dL (ref 5–40)

## 2015-12-15 LAB — TSH: TSH: 4.28 u[IU]/mL (ref 0.450–4.500)

## 2015-12-15 NOTE — Telephone Encounter (Signed)
Patient notified of lab results

## 2015-12-16 ENCOUNTER — Ambulatory Visit (INDEPENDENT_AMBULATORY_CARE_PROVIDER_SITE_OTHER): Payer: Commercial Managed Care - HMO | Admitting: Cardiovascular Disease

## 2015-12-16 ENCOUNTER — Ambulatory Visit (INDEPENDENT_AMBULATORY_CARE_PROVIDER_SITE_OTHER)
Admission: RE | Admit: 2015-12-16 | Discharge: 2015-12-16 | Disposition: A | Discharge status: Home / Self Care | Payer: Self-pay | Source: Ambulatory Visit | Attending: Cardiovascular Disease | Admitting: Cardiovascular Disease

## 2015-12-16 ENCOUNTER — Encounter: Payer: Self-pay | Admitting: Cardiovascular Disease

## 2015-12-16 VITALS — BP 118/74 | HR 52 | Ht 70.0 in | Wt 220.2 lb

## 2015-12-16 DIAGNOSIS — E785 Hyperlipidemia, unspecified: Secondary | ICD-10-CM | POA: Diagnosis not present

## 2015-12-16 DIAGNOSIS — E669 Obesity, unspecified: Secondary | ICD-10-CM

## 2015-12-16 DIAGNOSIS — R9431 Abnormal electrocardiogram [ECG] [EKG]: Secondary | ICD-10-CM | POA: Diagnosis not present

## 2015-12-16 DIAGNOSIS — I10 Essential (primary) hypertension: Secondary | ICD-10-CM

## 2015-12-16 DIAGNOSIS — R739 Hyperglycemia, unspecified: Secondary | ICD-10-CM

## 2015-12-16 DIAGNOSIS — R001 Bradycardia, unspecified: Secondary | ICD-10-CM | POA: Insufficient documentation

## 2015-12-16 NOTE — Patient Instructions (Addendum)
You are doing well.  Consider decreasing the atenolol down to one a day in the morning  You are scheduled for a CT Calcium Score today @ 1:15, please arrive @ 1:00 In our [facility] office [address] There is a one-time fee of $150.00 due at the time of your procedure  Coronary Calcium Scan A coronary calcium scan is an imaging test used to look for deposits of calcium and other fatty materials (plaques) in the inner lining of the blood vessels of your heart (coronary arteries). These deposits of calcium and plaques can partly clog and narrow the coronary arteries without producing any symptoms or warning signs. This puts you at risk for a heart attack. This test can detect these deposits before symptoms develop.  LET [facility] CARE PROVIDER KNOW ABOUT:  Any allergies you have.  All medicines you are taking, including vitamins, herbs, eye drops, creams, and over-the-counter medicines.  Previous problems you or members of your family have had with the use of anesthetics.  Any blood disorders you have.  Previous surgeries you have had.  Medical conditions you have.  Possibility of pregnancy, if this applies. RISKS AND COMPLICATIONS Generally, this is a safe procedure. However, as with any procedure, complications can occur. This test involves the use of radiation. Radiation exposure can be dangerous to a pregnant woman and her unborn baby. If you are pregnant, you should not have this procedure done.  BEFORE THE PROCEDURE There is no special preparation for the procedure. PROCEDURE  You will need to undress and put on a hospital gown. You will need to remove any jewelry around your neck or chest.  Sticky electrodes are placed on your chest and are connected to an electrocardiogram (EKG or electrocardiography) machine to recorda tracing of the electrical activity of your heart.  A CT scanner will take pictures of your heart. During this time, you will be asked to  lie still and hold your breath for 2-3 seconds while a picture is being taken of your heart. AFTER THE PROCEDURE   You will be allowed to get dressed.  You can return to your normal activities after the scan is done.   This information is not intended to replace advice given to you by your health care provider. Make sure you discuss any questions you have with your health care provider.   Document Released: 06/09/2008 Document Revised: 12/17/2013 Document Reviewed: 08/19/2013 Elsevier Interactive Patient Education [employer].

## 2015-12-16 NOTE — Assessment & Plan Note (Signed)
Blood pressure stable, heart rate is low Recommended he hold the atenolol in the evening

## 2015-12-16 NOTE — Assessment & Plan Note (Signed)
Heart rate 52 on today's visit, 53 with primary care Recommended he hold atenolol in the evening

## 2015-12-16 NOTE — Assessment & Plan Note (Signed)
Stressed the importance of weight loss Cholesterol has jumped 20 points with his LDL with his weight gain

## 2015-12-16 NOTE — Progress Notes (Signed)
Patient ID: Stephen Stein, male    DOB: 1941-07-30, 74 y.o.   MRN: [id number]  HPI Comments: [name] is a very pleasant 74 -year-old gentleman with prior smoking history, stopped 20 years ago, hyperlipidemia, history of palpitations, treated with atenolol, presenting for abnormal EKG.  He reports that previous attempt to hold his atenolol or unsuccessful, he had palpitations, chest discomfort. Because of this reason he is remained on atenolol for many years with no significant problems. Denies any lightheadedness, dizziness, fatigue. Has not been aware that heart rate runs low.  Generally very active at baseline, no regular exercise program, likes to go camping.  Reports his weight is up 11 pounds over the past year, eating the wrong foods, trying to turn that around. Because of weight gain, sugars have been running mildly high.  EKG on today's visit shows normal sinus rhythm with rate 52 bpm, unable to exclude old anterior MI Prior EKG with primary care shows similar finding of possible old anterior MI, Finding of prolonged QT and T-wave abnormality is not particularly impressive  He reports having stress test 15 or 20 years ago for irregular heartbeats, details unavailable Never had echocardiogram or cardiac catheterization   No Known Allergies  Current Outpatient Prescriptions on File Prior to Visit  Medication Sig Dispense Refill  . atenolol (TENORMIN) 25 MG tablet Take 1 tablet (25 mg total) by mouth 2 (two) times daily. 180 tablet 1  . fish oil-omega-3 fatty acids 1000 MG capsule Take 1 g by mouth daily.    [name] lovastatin (MEVACOR) 20 MG tablet Take 1 tablet (20 mg total) by mouth at bedtime. 90 tablet 1  . meloxicam (MOBIC) 15 MG tablet Take 1 tablet (15 mg total) by mouth daily. 90 tablet 1  . Multiple Vitamin (MULTIVITAMIN) tablet Take 1 tablet by mouth daily.    . Saw Palmetto, Serenoa repens, (SAW PALMETTO PO) Take by mouth daily.     No current facility-administered  medications on file prior to visit.    Past Medical History  Diagnosis Date  . Irregular heartbeat     been taking Atenolol for 20 years  . BPH (benign prostatic hyperplasia)   . Hyperlipidemia   . Hypertension     Past Surgical History  Procedure Laterality Date  . Tonsillectomy and adenoidectomy  1955  . Colonoscopy  2008    Social History  reports that he quit smoking about 23 years ago. He quit smokeless tobacco use about 23 years ago. He reports that he drinks alcohol. He reports that he does not use illicit drugs.  Family History family history includes Cancer in his mother; Hearing loss in his brother and sister; Heart disease in his father. There is no history of Colon cancer or Stomach cancer.    Review of Systems  Constitutional: Negative.   Respiratory: Negative.   Cardiovascular: Negative.   Gastrointestinal: Negative.   Musculoskeletal: Negative.   Neurological: Negative.   Hematological: Negative.   Psychiatric/Behavioral: Negative.   All other systems reviewed and are negative.   BP 118/74 mmHg  Pulse 52  Ht 5\' 10"  (1.778 m)  Wt 220 lb 4 oz (99.905 kg)  BMI 31.60 kg/m2  Physical Exam  Constitutional: He is oriented to person, place, and time. He appears well-developed and well-nourished.  HENT:  Head: Normocephalic.  Nose: Nose normal.  Mouth/Throat: Oropharynx is clear and moist.  Eyes: Conjunctivae are normal. Pupils are equal, round, and reactive to light.  Neck: Normal range of motion.  Neck supple. No JVD present.  Cardiovascular: Normal rate, regular rhythm, normal heart sounds and intact distal pulses.  Exam reveals no gallop and no friction rub.   No murmur heard. Pulmonary/Chest: Effort normal and breath sounds normal. No respiratory distress. He has no wheezes. He has no rales. He exhibits no tenderness.  Abdominal: Soft. Bowel sounds are normal. He exhibits no distension. There is no tenderness.  Musculoskeletal: Normal range of motion.  He exhibits no edema or tenderness.  Lymphadenopathy:    He has no cervical adenopathy.  Neurological: He is alert and oriented to person, place, and time. Coordination normal.  Skin: Skin is warm and dry. No rash noted. No erythema.  Psychiatric: He has a normal mood and affect. His behavior is normal. Judgment and thought content normal.

## 2015-12-16 NOTE — Assessment & Plan Note (Signed)
We have encouraged continued exercise, careful diet management in an effort to lose weight. 

## 2015-12-16 NOTE — Assessment & Plan Note (Signed)
He is asymptomatic, EKG finding may be secondary to lead placement. He does have several risk factors including smoking history, hyperlipidemia, age We have suggested CT coronary calcium score for risk stratification If scores very low, no further testing needed If scores very high, may need stress testing even catheterization Scheduled for today at 1:00pm

## 2015-12-16 NOTE — Assessment & Plan Note (Signed)
Sugars have climbed with recent weight gain, he is trying to get the weight back down

## 2015-12-22 ENCOUNTER — Telehealth: Payer: Self-pay | Admitting: Family Medicine

## 2015-12-22 NOTE — Telephone Encounter (Signed)
Pt would like results of CT scan. Please return his call.

## 2015-12-25 ENCOUNTER — Telehealth: Payer: Self-pay | Admitting: *Deleted

## 2015-12-25 NOTE — Telephone Encounter (Signed)
Results are in Dr. Simon Rhein for review. It will take longer to result since Dr. Rockey Situ is out of the office.

## 2015-12-25 NOTE — Telephone Encounter (Signed)
Pt is out of town but would like to know the results on CT score he did Please call when done.

## 2016-01-01 ENCOUNTER — Other Ambulatory Visit: Payer: Self-pay

## 2016-01-01 MED ORDER — ROSUVASTATIN CALCIUM 20 MG PO TABS: 20.0000 mg | ORAL_TABLET | Freq: Every day | ORAL | Status: DC

## 2016-03-03 ENCOUNTER — Telehealth: Payer: Self-pay | Admitting: Cardiovascular Disease

## 2016-03-03 DIAGNOSIS — R9431 Abnormal electrocardiogram [ECG] [EKG]: Secondary | ICD-10-CM

## 2016-03-03 DIAGNOSIS — I2510 Atherosclerotic heart disease of native coronary artery without angina pectoris: Secondary | ICD-10-CM

## 2016-03-03 NOTE — Telephone Encounter (Signed)
Spoke w/ pt.  He states that he was told to schedule a stress test before his appt, but he is unsure what type he needs. Advised him that I will find out from Dr. Rockey Situ if pt needs a GXT or nuclear stress test and call pt to set this up.

## 2016-03-03 NOTE — Telephone Encounter (Signed)
Spoke w/ pt.  Advised him of Dr. Donivan Scull recommendation.  He is sched for ETT Myoview 3/22 @ 7:30 as he is currently out of town.  Reviewed instructions w/ him and will mail to him, as well.  Asked him to call back w/ any questions or concerns.

## 2016-03-03 NOTE — Telephone Encounter (Signed)
Patient called and wants to schedule his stress test. He will be back in town after 03/09/16. Please call patient.

## 2016-03-03 NOTE — Telephone Encounter (Signed)
For abnormal EKG, coronary artery disease, elevated CT coronary calcium scoring Would order a treadmill Myoview

## 2016-03-16 ENCOUNTER — Encounter
Admission: RE | Admit: 2016-03-16 | Discharge: 2016-03-16 | Disposition: A | Discharge status: Home / Self Care | Payer: PPO | Source: Ambulatory Visit | Attending: Cardiovascular Disease | Admitting: Cardiovascular Disease

## 2016-03-16 DIAGNOSIS — I2510 Atherosclerotic heart disease of native coronary artery without angina pectoris: Secondary | ICD-10-CM | POA: Diagnosis not present

## 2016-03-16 DIAGNOSIS — R9431 Abnormal electrocardiogram [ECG] [EKG]: Secondary | ICD-10-CM

## 2016-03-16 LAB — NM MYOCAR MULTI W/SPECT W/WALL MOTION / EF
CSEPED: 8 min
CSEPPHR: 122 {beats}/min
Estimated workload: 10.1 METS
LV dias vol: 64 mL (ref 62–150)
LV sys vol: 20 mL
NUC STRESS TID: 0.81
Percent HR: 82 %
Rest HR: 53 {beats}/min
SDS: 4
SRS: 0
SSS: 5

## 2016-03-16 MED ORDER — TECHNETIUM TC 99M SESTAMIBI - CARDIOLITE
14.0580 | Freq: Once | INTRAVENOUS | Status: AC | PRN
  Administered 2016-03-16: 08:00:00 14.058 via INTRAVENOUS

## 2016-03-16 MED ORDER — TECHNETIUM TC 99M SESTAMIBI - CARDIOLITE
32.2750 | Freq: Once | INTRAVENOUS | Status: AC | PRN
  Administered 2016-03-16: 32.275 via INTRAVENOUS

## 2016-03-25 ENCOUNTER — Ambulatory Visit (INDEPENDENT_AMBULATORY_CARE_PROVIDER_SITE_OTHER): Payer: PPO | Admitting: Cardiovascular Disease

## 2016-03-25 ENCOUNTER — Encounter: Payer: Self-pay | Admitting: Cardiovascular Disease

## 2016-03-25 VITALS — BP 118/60 | HR 55 | Ht 70.0 in | Wt 221.5 lb

## 2016-03-25 DIAGNOSIS — E669 Obesity, unspecified: Secondary | ICD-10-CM

## 2016-03-25 DIAGNOSIS — I10 Essential (primary) hypertension: Secondary | ICD-10-CM

## 2016-03-25 DIAGNOSIS — I2510 Atherosclerotic heart disease of native coronary artery without angina pectoris: Secondary | ICD-10-CM | POA: Diagnosis not present

## 2016-03-25 DIAGNOSIS — I25118 Atherosclerotic heart disease of native coronary artery with other forms of angina pectoris: Secondary | ICD-10-CM | POA: Insufficient documentation

## 2016-03-25 DIAGNOSIS — E785 Hyperlipidemia, unspecified: Secondary | ICD-10-CM | POA: Diagnosis not present

## 2016-03-25 DIAGNOSIS — R739 Hyperglycemia, unspecified: Secondary | ICD-10-CM

## 2016-03-25 MED ORDER — ROSUVASTATIN CALCIUM 40 MG PO TABS: 40.0000 mg | ORAL_TABLET | Freq: Every day | ORAL | Status: DC

## 2016-03-25 NOTE — Assessment & Plan Note (Signed)
Stressed importance of low glucose levels especially given his underlying coronary artery disease

## 2016-03-25 NOTE — Assessment & Plan Note (Signed)
3 vessel coronary disease, moderate, noted on recent CT coronary calcium scoring, score of 800 Recommended aggressive lipid management, goal LDL less than 70 Long discussion with him today concerning his numbers, CT scan images shown to him Stress test showing no ischemia

## 2016-03-25 NOTE — Progress Notes (Signed)
Patient ID: Stephen Stein, male    DOB: 07-23-1941, 75 y.o.   MRN: [id number]  HPI Comments: Stephen Stein is a very pleasant 54 -year-old gentleman with prior smoking history, stopped 20 years ago, hyperlipidemia, history of palpitations, treated with atenolol, initially presenting with abnormal EKG, had CT coronary calcium score with score around 800, stress testing showing no significant ischemia who presents for routine follow-up to go over  his testing and coronary artery disease   In follow-up, he reports feeling well, no chest pain or shortness of breath on exertion Long discussion concerning his CT coronary calcium score, Images were shown to him in detail in the office, pulled up on computer He has three-vessel coronary artery disease, no significant aortic atherosclerosis  Details of the stress test discussed with him as well showing no significant ischemia.  Long discussion concerning the cholesterol medication, he is tolerating Crestor 20 mg daily  He does have low heart rate on atenolol 25 mill grams in the morning, 12.5 mg in the evening   very active at baseline, no regular exercise program  Other past medical history reviewed Previous EKG, EKG on today's visit shows normal sinus rhythm with rate 52 bpm, unable to exclude old anterior MI Prior EKG with primary care shows similar finding of possible old anterior MI, Finding of prolonged QT and T-wave abnormality is not particularly impressive  He reports having stress test 15 or 20 years ago for irregular heartbeats, details unavailable Never had echocardiogram or cardiac catheterization   No Known Allergies  Current Outpatient Prescriptions on File Prior to Visit  Medication Sig Dispense Refill  . atenolol (TENORMIN) 25 MG tablet Take 1 tablet (25 mg total) by mouth 2 (two) times daily. 180 tablet 1  . fish oil-omega-3 fatty acids 1000 MG capsule Take 1 g by mouth daily.    . meloxicam (MOBIC) 15 MG tablet Take 1 tablet  (15 mg total) by mouth daily. 90 tablet 1  . Multiple Vitamin (MULTIVITAMIN) tablet Take 1 tablet by mouth daily.    . Saw Palmetto, Serenoa repens, (SAW PALMETTO PO) Take by mouth daily.     No current facility-administered medications on file prior to visit.    Past Medical History  Diagnosis Date  . Irregular heartbeat     been taking Atenolol for 20 years  . BPH (benign prostatic hyperplasia)   . Hyperlipidemia   . Hypertension     Past Surgical History  Procedure Laterality Date  . Tonsillectomy and adenoidectomy  1955  . Colonoscopy  2008    Social History  reports that he quit smoking about 23 years ago. He quit smokeless tobacco use about 23 years ago. He reports that he drinks alcohol. He reports that he does not use illicit drugs.  Family History family history includes Cancer in his mother; Hearing loss in his brother and sister; Heart disease in his father. There is no history of Colon cancer or Stomach cancer.    Review of Systems  Constitutional: Negative.   Respiratory: Negative.   Cardiovascular: Negative.   Gastrointestinal: Negative.   Musculoskeletal: Negative.   Neurological: Negative.   Hematological: Negative.   Psychiatric/Behavioral: Negative.   All other systems reviewed and are negative.   BP 118/60 mmHg  Pulse 55  Ht 5\' 10"  (1.778 m)  Wt 221 lb 8 oz (100.472 kg)  BMI 31.78 kg/m2  SpO2 98%  Physical Exam  Constitutional: He is oriented to person, place, and time. He appears well-developed  and well-nourished.  HENT:  Head: Normocephalic.  Nose: Nose normal.  Mouth/Throat: Oropharynx is clear and moist.  Eyes: Conjunctivae are normal. Pupils are equal, round, and reactive to light.  Neck: Normal range of motion. Neck supple. No JVD present.  Cardiovascular: Normal rate, regular rhythm, normal heart sounds and intact distal pulses.  Exam reveals no gallop and no friction rub.   No murmur heard. Pulmonary/Chest: Effort normal and breath  sounds normal. No respiratory distress. He has no wheezes. He has no rales. He exhibits no tenderness.  Abdominal: Soft. Bowel sounds are normal. He exhibits no distension. There is no tenderness.  Musculoskeletal: Normal range of motion. He exhibits no edema or tenderness.  Lymphadenopathy:    He has no cervical adenopathy.  Neurological: He is alert and oriented to person, place, and time. Coordination normal.  Skin: Skin is warm and dry. No rash noted. No erythema.  Psychiatric: He has a normal mood and affect. His behavior is normal. Judgment and thought content normal.

## 2016-03-25 NOTE — Assessment & Plan Note (Signed)
Cholesterol is at goal on the current lipid regimen. No changes to the medications were made. Stay on Crestor 20 mg daily, lipid panel check in 2 months time

## 2016-03-25 NOTE — Assessment & Plan Note (Signed)
We have encouraged continued exercise, careful diet management in an effort to lose weight. 

## 2016-03-25 NOTE — Patient Instructions (Addendum)
You are doing well.  We will check liver and lipids through [facility] (need order to take)  Decrease the atenolol down to one a day in the morning  Aspirin 81 mg daily coated  Please call us if you have new issues that need to be addressed before your next appt.  Your physician wants you to follow-up in: 6 months.  You will receive a reminder letter in the mail two months in advance. If you don't receive a letter, please call our office to schedule the follow-up appointment.

## 2016-03-25 NOTE — Assessment & Plan Note (Signed)
Blood pressure is well controlled on today's visit. No changes made to the medications.   Total encounter time more than 25 minutes  Greater than 50% was spent in counseling and coordination of care with the patient  

## 2016-06-01 ENCOUNTER — Other Ambulatory Visit: Payer: Self-pay | Admitting: Cardiovascular Disease

## 2016-06-01 DIAGNOSIS — E785 Hyperlipidemia, unspecified: Secondary | ICD-10-CM | POA: Diagnosis not present

## 2016-06-02 LAB — HEPATIC FUNCTION PANEL
ALK PHOS: 21 IU/L — AB (ref 39–117)
ALT: 31 IU/L (ref 0–44)
AST: 30 IU/L (ref 0–40)
Albumin: 4.3 g/dL (ref 3.5–4.8)
BILIRUBIN TOTAL: 0.4 mg/dL (ref 0.0–1.2)
BILIRUBIN, DIRECT: 0.13 mg/dL (ref 0.00–0.40)
TOTAL PROTEIN: 6.8 g/dL (ref 6.0–8.5)

## 2016-06-02 LAB — LIPID PANEL W/O CHOL/HDL RATIO
CHOLESTEROL TOTAL: 110 mg/dL (ref 100–199)
HDL: 39 mg/dL — ABNORMAL LOW (ref >39)
LDL CALC: 59 mg/dL (ref 0–99)
TRIGLYCERIDES: 58 mg/dL (ref 0–149)
VLDL Cholesterol Cal: 12 mg/dL (ref 5–40)

## 2016-06-06 ENCOUNTER — Other Ambulatory Visit: Payer: Self-pay

## 2016-06-06 DIAGNOSIS — E785 Hyperlipidemia, unspecified: Secondary | ICD-10-CM

## 2016-06-14 ENCOUNTER — Ambulatory Visit: Payer: Commercial Managed Care - HMO | Admitting: Family Medicine

## 2016-06-29 DIAGNOSIS — H2513 Age-related nuclear cataract, bilateral: Secondary | ICD-10-CM | POA: Diagnosis not present

## 2016-08-17 ENCOUNTER — Other Ambulatory Visit: Payer: Self-pay | Admitting: Cardiovascular Disease

## 2016-09-06 ENCOUNTER — Ambulatory Visit: Payer: Commercial Managed Care - HMO | Admitting: Family Medicine

## 2016-09-08 ENCOUNTER — Other Ambulatory Visit: Payer: Self-pay

## 2016-09-08 ENCOUNTER — Ambulatory Visit (INDEPENDENT_AMBULATORY_CARE_PROVIDER_SITE_OTHER): Payer: PPO | Admitting: Cardiovascular Disease

## 2016-09-08 ENCOUNTER — Encounter: Payer: Self-pay | Admitting: Cardiovascular Disease

## 2016-09-08 ENCOUNTER — Telehealth: Payer: Self-pay | Admitting: Family Medicine

## 2016-09-08 VITALS — BP 146/72 | HR 55 | Ht 70.0 in | Wt 220.0 lb

## 2016-09-08 DIAGNOSIS — I10 Essential (primary) hypertension: Secondary | ICD-10-CM

## 2016-09-08 DIAGNOSIS — I2510 Atherosclerotic heart disease of native coronary artery without angina pectoris: Secondary | ICD-10-CM

## 2016-09-08 DIAGNOSIS — R739 Hyperglycemia, unspecified: Secondary | ICD-10-CM | POA: Diagnosis not present

## 2016-09-08 DIAGNOSIS — E785 Hyperlipidemia, unspecified: Secondary | ICD-10-CM | POA: Diagnosis not present

## 2016-09-08 MED ORDER — ATENOLOL 25 MG PO TABS: 25.0000 mg | ORAL_TABLET | Freq: Two times a day (BID) | ORAL | 2 refills | Status: DC

## 2016-09-08 MED ORDER — ROSUVASTATIN CALCIUM 40 MG PO TABS: 40.0000 mg | ORAL_TABLET | Freq: Every day | ORAL | 3 refills | Status: DC

## 2016-09-08 NOTE — Progress Notes (Signed)
Cardiology Office Note  Date:  09/08/2016   ID:  Stephen Stein, DOB 07/06/1941, MRN [id number]  PCP:  Ashok Norris, MD   Chief Complaint  Patient presents with  . other    6 month follow up. Meds reviewed by the patient verbally. "doing well."     HPI:  Stephen Stein is a very pleasant 75 -year-old gentleman with prior smoking history, stopped 20 years ago, hyperlipidemia, history of palpitations, treated with atenolol, initially presenting with abnormal EKG, had CT coronary calcium score with score around 800, stress testing showing no significant ischemia who presents for routine follow-up to go over  his testing and coronary artery disease   Walks daily, Travels in his RV, was recently up in [place] Denies any significant chest pain or shortness of breath on exertion Tolerating Crestor 20 mg daily, total cholesterol down to 110, LDL 57 Mildly elevated glucose levels typically less than 110 Otherwise no complaints Atenolol decreased on previous office visit for bradycardia, takes 25 mg daily  EKG on today's visit shows normal sinus rhythm with rate 55 bpm, no significant ST or T-wave changes  Other past medical history reviewed CT coronary calcium score, 800 He has three-vessel coronary artery disease, no significant aortic atherosclerosis  Previous stress test showing no significant ischemia.  Previous EKG, EKG on today's visit shows normal sinus rhythm with rate 52 bpm, unable to exclude old anterior MI Prior EKG with primary care shows similar finding of possible old anterior MI, Finding of prolonged QT and T-wave abnormality is not particularly impressive  He reports having stress test 15 or 20 years ago for irregular heartbeats, details unavailable Never had echocardiogram or cardiac catheterization  PMH:   has a past medical history of BPH (benign prostatic hyperplasia); Hyperlipidemia; Hypertension; and Irregular heartbeat.  PSH:    Past Surgical History:   Procedure Laterality Date  . COLONOSCOPY  2008  . TONSILLECTOMY AND ADENOIDECTOMY  1955    Current Outpatient Prescriptions  Medication Sig Dispense Refill  . atenolol (TENORMIN) 25 MG tablet Take 1 tablet (25 mg total) by mouth 2 (two) times daily. 180 tablet 2  . fish oil-omega-3 fatty acids 1000 MG capsule Take 1 g by mouth daily.    . meloxicam (MOBIC) 15 MG tablet Take 1 tablet (15 mg total) by mouth daily. 90 tablet 1  . Multiple Vitamin (MULTIVITAMIN) tablet Take 1 tablet by mouth daily.    . rosuvastatin (CRESTOR) 40 MG tablet Take 1 tablet (40 mg total) by mouth daily. 90 tablet 3  . Saw Palmetto, Serenoa repens, (SAW PALMETTO PO) Take by mouth daily.     No current facility-administered medications for this visit.      Allergies:   Review of patient's allergies indicates no known allergies.   Social History:  The patient  reports that he quit smoking about 24 years ago. He quit smokeless tobacco use about 24 years ago. He reports that he drinks alcohol. He reports that he does not use drugs.   Family History:   family history includes Cancer in his mother; Hearing loss in his brother and sister; Heart disease in his father.    Review of Systems: Review of Systems  Constitutional: Negative.   Respiratory: Negative.   Cardiovascular: Negative.   Gastrointestinal: Negative.   Musculoskeletal: Negative.   Neurological: Negative.   Psychiatric/Behavioral: Negative.   All other systems reviewed and are negative.    PHYSICAL EXAM: VS:  BP (!) 146/72 (BP Location: Left Arm, Patient  Position: Sitting, Cuff Size: Normal)   Pulse (!) 55   Ht 5\' 10"  (1.778 m)   Wt 220 lb (99.8 kg)   BMI 31.57 kg/m  , BMI Body mass index is 31.57 kg/m. GEN: Well nourished, well developed, in no acute distress  HEENT: normal  Neck: no JVD, carotid bruits, or masses Cardiac: RRR; no murmurs, rubs, or gallops,no edema  Respiratory:  clear to auscultation bilaterally, normal work of  breathing GI: soft, nontender, nondistended, + BS MS: no deformity or atrophy  Skin: warm and dry, no rash Neuro:  Strength and sensation are intact Psych: euthymic mood, full affect    Recent Labs: 12/14/2015: BUN 15; Creatinine, Ser 1.30; Potassium 4.9; Sodium 136; TSH 4.280 06/01/2016: ALT 31    Lipid Panel Lab Results  Component Value Date   CHOL 110 06/01/2016   HDL 39 (L) 06/01/2016   LDLCALC 59 06/01/2016   TRIG 58 06/01/2016      Wt Readings from Last 3 Encounters:  09/08/16 220 lb (99.8 kg)  03/25/16 221 lb 8 oz (100.5 kg)  12/16/15 220 lb 4 oz (99.9 kg)       ASSESSMENT AND PLAN:  Coronary artery disease involving native coronary artery of native heart without angina pectoris - Plan: EKG 12-Lead Currently with no symptoms of angina. No further workup at this time. Continue current medication regimen.  Essential hypertension - Plan: EKG 12-Lead Repeat blood pressure systolic in the 123456 range No changes to his medications  Hyperglycemia Recommended low carbohydrate diet, weight loss for borderline sugars  Hyperlipemia Cholesterol is at goal on the current lipid regimen. No changes to the medications were made.   Total encounter time more than 15 minutes  Greater than 50% was spent in counseling and coordination of care with the patient   Disposition:   F/U  12 months   Orders Placed This Encounter  Procedures  . EKG 12-Lead     Signed, Esmond Plants, M.D., Ph.D. 09/08/2016  [place], [place]

## 2016-09-08 NOTE — Patient Instructions (Addendum)
Medication Instructions:   No medication changes made No changes Watch the carbs  Labwork:  No new labs needed  Testing/Procedures:  No further testing at this time   Follow-Up: It was a pleasure seeing you in the office today. Please call us if you have new issues that need to be addressed before your next appt.  [phone]  Your physician wants you to follow-up in: 12 months.  You will receive a reminder letter in the mail two months in advance. If you don't receive a letter, please call our office to schedule the follow-up appointment.  If you need a refill on your cardiac medications before your next appointment, please call your pharmacy.

## 2016-09-11 MED ORDER — MELOXICAM 15 MG PO TABS: 15.0000 mg | ORAL_TABLET | Freq: Every day | ORAL | 0 refills | Status: DC | PRN

## 2016-09-11 NOTE — Telephone Encounter (Signed)
Note from Dr. Rockey Situ; okay for Rx

## 2016-09-14 NOTE — Telephone Encounter (Signed)
done

## 2016-09-27 ENCOUNTER — Ambulatory Visit: Payer: Commercial Managed Care - HMO | Admitting: Family Medicine

## 2016-10-04 ENCOUNTER — Ambulatory Visit (INDEPENDENT_AMBULATORY_CARE_PROVIDER_SITE_OTHER): Payer: PPO | Admitting: Family Medicine

## 2016-10-04 ENCOUNTER — Encounter: Payer: Self-pay | Admitting: Family Medicine

## 2016-10-04 VITALS — BP 124/70 | HR 60 | Temp 98.0°F | Resp 16 | Ht 70.0 in | Wt 226.5 lb

## 2016-10-04 DIAGNOSIS — M19011 Primary osteoarthritis, right shoulder: Secondary | ICD-10-CM | POA: Diagnosis not present

## 2016-10-04 DIAGNOSIS — M19019 Primary osteoarthritis, unspecified shoulder: Secondary | ICD-10-CM | POA: Diagnosis not present

## 2016-10-04 DIAGNOSIS — E6609 Other obesity due to excess calories: Secondary | ICD-10-CM

## 2016-10-04 DIAGNOSIS — I2510 Atherosclerotic heart disease of native coronary artery without angina pectoris: Secondary | ICD-10-CM | POA: Diagnosis not present

## 2016-10-04 DIAGNOSIS — M19012 Primary osteoarthritis, left shoulder: Secondary | ICD-10-CM | POA: Insufficient documentation

## 2016-10-04 DIAGNOSIS — Z6832 Body mass index (BMI) 32.0-32.9, adult: Secondary | ICD-10-CM | POA: Diagnosis not present

## 2016-10-04 DIAGNOSIS — R7989 Other specified abnormal findings of blood chemistry: Secondary | ICD-10-CM | POA: Diagnosis not present

## 2016-10-04 DIAGNOSIS — E782 Mixed hyperlipidemia: Secondary | ICD-10-CM | POA: Diagnosis not present

## 2016-10-04 DIAGNOSIS — R739 Hyperglycemia, unspecified: Secondary | ICD-10-CM | POA: Diagnosis not present

## 2016-10-04 DIAGNOSIS — I10 Essential (primary) hypertension: Secondary | ICD-10-CM

## 2016-10-04 NOTE — Assessment & Plan Note (Signed)
Work on weight loss, 10-15 pounds

## 2016-10-04 NOTE — Assessment & Plan Note (Signed)
Try to keep LDL under 70; limit saturated fats

## 2016-10-04 NOTE — Progress Notes (Signed)
BP 124/70 (BP Location: Right Arm, Patient Position: Sitting, Cuff Size: Large)   Pulse 60   Temp 98 F (36.7 C) (Oral)   Resp 16   Ht 5\' 10"  (1.778 m)   Wt 226 lb 8 oz (102.7 kg)   SpO2 97%   BMI 32.50 kg/m    Subjective:    Patient ID: Stephen Stein, male    DOB: [birth year], 75 y.o.   MRN: [id number]  HPI: Stephen Stein is a 75 y.o. male  Chief Complaint  Patient presents with  . Medication Refill   Patient is new to me; his usual provider is out of the office for an extended time  Patient says Dr. Rutherford Nail did an EKG and then sent him to the cardiologist; he sent him to [facility]; currently no chest pain; he had calcium scoring and nuclear medicine tests; he saw cardiologist and he switched him to Crestor; no problems with muscle aches; he has been trying to eat better; real fell off the wagon last month, spent August in [place], going to [place] in January  Taking saw palmetto for prostate health  HTN; well-controlled on medicine; he has not heard about DASH guidelines yet; has gained some weight on vacation  Elevated glucose in Dec but normal A1c  He takes the meloxicam for arthritis, limited ROM in shoulders; he is not interested in seeing an orthopaedist or doing physical therapy  Depression screen [facility] 2/9 10/04/2016 12/14/2015  Decreased Interest 0 0  Down, Depressed, Hopeless 0 0  PHQ - 2 Score 0 0   Relevant past medical, surgical, family and social history reviewed Past Medical History:  Diagnosis Date  . BPH (benign prostatic hyperplasia)   . Hyperlipidemia   . Hypertension   . Irregular heartbeat    been taking Atenolol for 20 years   Past Surgical History:  Procedure Laterality Date  . COLONOSCOPY  2008  . TONSILLECTOMY AND ADENOIDECTOMY  1955   Family History  Problem Relation Age of Onset  . Cancer Mother   . Heart disease Father   . Hearing loss Sister   . Hearing loss Brother   . Colon cancer Neg Hx   . Stomach cancer Neg Hx    Social  History  Substance Use Topics  . Smoking status: Former Smoker    Quit date: 06/22/1992  . Smokeless tobacco: Former Systems developer    Quit date: 06/22/1992  . Alcohol use 0.0 oz/week     Comment: occasional   Interim medical history since last visit reviewed. Allergies and medications reviewed  Review of Systems  Constitutional: Positive for unexpected weight change (little bit of weight gain on vacation).  Cardiovascular: Negative for chest pain.   Per HPI unless specifically indicated above     Objective:    BP 124/70 (BP Location: Right Arm, Patient Position: Sitting, Cuff Size: Large)   Pulse 60   Temp 98 F (36.7 C) (Oral)   Resp 16   Ht 5\' 10"  (1.778 m)   Wt 226 lb 8 oz (102.7 kg)   SpO2 97%   BMI 32.50 kg/m   Wt Readings from Last 3 Encounters:  10/04/16 226 lb 8 oz (102.7 kg)  09/08/16 220 lb (99.8 kg)  03/25/16 221 lb 8 oz (100.5 kg)    Physical Exam  Constitutional: He appears well-developed and well-nourished. No distress.  HENT:  Head: Normocephalic and atraumatic.  Eyes: EOM are normal. No scleral icterus.  Neck: No thyromegaly present.  Cardiovascular: Normal  rate and regular rhythm.   Pulmonary/Chest: Effort normal and breath sounds normal.  Abdominal: Soft. Bowel sounds are normal. He exhibits no distension.  Musculoskeletal: He exhibits no edema.  Neurological: Coordination normal.  Skin: Skin is warm and dry. No pallor.  Psychiatric: He has a normal mood and affect. His behavior is normal. Judgment and thought content normal.   Results for orders placed or performed in visit on 06/01/16  Lipid Panel w/o Chol/HDL Ratio  Result Value Ref Range   Cholesterol, Total 110 100 - 199 mg/dL   Triglycerides 58 0 - 149 mg/dL   HDL 39 (L) >39 mg/dL   VLDL Cholesterol Cal 12 5 - 40 mg/dL   LDL Calculated 59 0 - 99 mg/dL  Hepatic function panel  Result Value Ref Range   Total Protein 6.8 6.0 - 8.5 g/dL   Albumin 4.3 3.5 - 4.8 g/dL   Bilirubin Total 0.4 0.0 - 1.2  mg/dL   Bilirubin, Direct 0.13 0.00 - 0.40 mg/dL   Alkaline Phosphatase 21 (L) 39 - 117 IU/L   AST 30 0 - 40 IU/L   ALT 31 0 - 44 IU/L      Assessment & Plan:   Problem List Items Addressed This Visit      Cardiovascular and Mediastinum   Essential hypertension - Primary (Chronic)    Controlled; try the DASH guidelines; see AVS; work on modest weight loss      Relevant Medications   aspirin EC 81 MG tablet   CAD (coronary artery disease) (Chronic)    On aspirin, beta-blocker, statin; follow up with cardiologist; discussed risk of meloxicam and all NSAIDs in regards to cardiac events      Relevant Medications   aspirin EC 81 MG tablet     Musculoskeletal and Integument   Bilateral shoulder region arthritis    Patient is not interested in PT at this time; limit or avoid NSAIDs if possible because of cardiac risk and kidney risk; try topicals, turmeric, tylenol instead      Relevant Medications   aspirin EC 81 MG tablet   RESOLVED: Arthritis, shoulder region   Relevant Medications   aspirin EC 81 MG tablet     Other   Obesity    Work on weight loss, 10-15 pounds      Hyperlipemia (Chronic)    Try to keep LDL under 70; limit saturated fats      Relevant Medications   aspirin EC 81 MG tablet   Hyperglycemia    Check glucose, A1c      Relevant Orders   Hemoglobin [id number]   BASIC METABOLIC PANEL WITH GFR   Elevated serum creatinine    Check Cr; avoid NSAIDs when possible, try tylenol; hydrate      Relevant Orders   BASIC METABOLIC PANEL WITH GFR    Other Visit Diagnoses   None.     Follow up plan: Return in about 6 months (around 04/04/2017) for follow-up and fasting labs.  An after-visit summary was printed and given to the patient at [facility].  Please see the patient instructions which may contain other information and recommendations beyond what is mentioned above in the assessment and plan.  Meds ordered this encounter  Medications  . aspirin EC 81 MG  tablet    Sig: Take 1 tablet (81 mg total) by mouth daily.    Orders Placed This Encounter  Procedures  . Hemoglobin A1c  . BASIC METABOLIC PANEL WITH GFR

## 2016-10-04 NOTE — Assessment & Plan Note (Addendum)
On aspirin, beta-blocker, statin; follow up with cardiologist; discussed risk of meloxicam and all NSAIDs in regards to cardiac events

## 2016-10-04 NOTE — Patient Instructions (Addendum)
Consider using tylenol (acetaminophen) for aches and pains You can use up to 3,000 mg a day and that is considered a safe limit Try turmeric as a natural anti-inflammatory (for pain and arthritis). It comes in capsules where you buy aspirin and fish oil, but also as a spice where you buy pepper and garlic powder.  Try to limit saturated fats in your diet (bologna, hot dogs, barbeque, cheeseburgers, hamburgers, steak, bacon, sausage, cheese, etc.) and get more fresh fruits, vegetables, and whole grains  We'll try to keep your LDL under 70 to help with plaque regression  DASH Eating Plan DASH stands for "Dietary Approaches to Stop Hypertension." The DASH eating plan is a healthy eating plan that has been shown to reduce high blood pressure (hypertension). Additional health benefits may include reducing the risk of type 2 diabetes mellitus, heart disease, and stroke. The DASH eating plan may also help with weight loss. WHAT DO I NEED TO KNOW ABOUT THE DASH EATING PLAN? For the DASH eating plan, you will follow these general guidelines:  Choose foods with a percent daily value for sodium of less than 5% (as listed on the food label).  Use salt-free seasonings or herbs instead of table salt or sea salt.  Check with your health care provider or pharmacist before using salt substitutes.  Eat lower-sodium products, often labeled as "lower sodium" or "no salt added."  Eat fresh foods.  Eat more vegetables, fruits, and low-fat dairy products.  Choose whole grains. Look for the word "whole" as the first word in the ingredient list.  Choose fish and skinless chicken or [place] more often than red meat. Limit fish, poultry, and meat to 6 oz (170 g) each day.  Limit sweets, desserts, sugars, and sugary drinks.  Choose heart-healthy fats.  Limit cheese to 1 oz (28 g) per day.  Eat more home-cooked food and less restaurant, buffet, and fast food.  Limit fried foods.  Cook foods using methods  other than frying.  Limit canned vegetables. If you do use them, rinse them well to decrease the sodium.  When eating at a restaurant, ask that your food be prepared with less salt, or no salt if possible. WHAT FOODS CAN I EAT? Seek help from a dietitian for individual calorie needs. Grains Whole grain or whole wheat bread. Brown rice. Whole grain or whole wheat pasta. Quinoa, bulgur, and whole grain cereals. Low-sodium cereals. Corn or whole wheat flour tortillas. Whole grain cornbread. Whole grain crackers. Low-sodium crackers. Vegetables Fresh or frozen vegetables (raw, steamed, roasted, or grilled). Low-sodium or reduced-sodium tomato and vegetable juices. Low-sodium or reduced-sodium tomato sauce and paste. Low-sodium or reduced-sodium canned vegetables.  Fruits All fresh, canned (in natural juice), or frozen fruits. Meat and Other Protein Products Ground beef (85% or leaner), grass-fed beef, or beef trimmed of fat. Skinless chicken or [place]. Ground chicken or [place]. Pork trimmed of fat. All fish and seafood. Eggs. Dried beans, peas, or lentils. Unsalted nuts and seeds. Unsalted canned beans. Dairy Low-fat dairy products, such as skim or 1% milk, 2% or reduced-fat cheeses, low-fat ricotta or cottage cheese, or plain low-fat yogurt. Low-sodium or reduced-sodium cheeses. Fats and Oils Tub margarines without trans fats. Light or reduced-fat mayonnaise and salad dressings (reduced sodium). Avocado. Safflower, olive, or canola oils. Natural peanut or almond butter. Other Unsalted popcorn and pretzels. The items listed above may not be a complete list of recommended foods or beverages. Contact your dietitian for more options. WHAT FOODS ARE NOT RECOMMENDED?  Grains White bread. White pasta. White rice. Refined cornbread. Bagels and croissants. Crackers that contain trans fat. Vegetables Creamed or fried vegetables. Vegetables in a cheese sauce. Regular canned vegetables. Regular canned  tomato sauce and paste. Regular tomato and vegetable juices. Fruits Dried fruits. Canned fruit in light or heavy syrup. Fruit juice. Meat and Other Protein Products Fatty cuts of meat. Ribs, chicken wings, bacon, sausage, bologna, salami, chitterlings, fatback, hot dogs, bratwurst, and packaged luncheon meats. Salted nuts and seeds. Canned beans with salt. Dairy Whole or 2% milk, cream, half-and-half, and cream cheese. Whole-fat or sweetened yogurt. Full-fat cheeses or blue cheese. Nondairy creamers and whipped toppings. Processed cheese, cheese spreads, or cheese curds. Condiments Onion and garlic salt, seasoned salt, table salt, and sea salt. Canned and packaged gravies. Worcestershire sauce. Tartar sauce. Barbecue sauce. Teriyaki sauce. Soy sauce, including reduced sodium. Steak sauce. Fish sauce. Oyster sauce. Cocktail sauce. Horseradish. Ketchup and mustard. Meat flavorings and tenderizers. Bouillon cubes. Hot sauce. Tabasco sauce. Marinades. Taco seasonings. Relishes. Fats and Oils Butter, stick margarine, lard, shortening, ghee, and bacon fat. Coconut, palm kernel, or palm oils. Regular salad dressings. Other Pickles and olives. Salted popcorn and pretzels. The items listed above may not be a complete list of foods and beverages to avoid. Contact your dietitian for more information. WHERE CAN I FIND MORE INFORMATION? National Heart, Lung, and Blood Institute: travelstabloid.com   This information is not intended to replace advice given to you by your health care provider. Make sure you discuss any questions you have with your health care provider.   Document Released: 12/01/2011 Document Revised: 01/02/2015 Document Reviewed: 10/16/2013 Elsevier Interactive Patient Education [employer].

## 2016-10-04 NOTE — Assessment & Plan Note (Signed)
Patient is not interested in PT at this time; limit or avoid NSAIDs if possible because of cardiac risk and kidney risk; try topicals, turmeric, tylenol instead

## 2016-10-04 NOTE — Assessment & Plan Note (Addendum)
Controlled; try the DASH guidelines; see AVS; work on modest weight loss

## 2016-10-04 NOTE — Assessment & Plan Note (Deleted)
Try tylenol, topicals, turmeric; explained cardiac risk of meloxicam

## 2016-10-04 NOTE — Assessment & Plan Note (Signed)
Check glucose, A1c

## 2016-10-04 NOTE — Assessment & Plan Note (Signed)
Check Cr; avoid NSAIDs when possible, try tylenol; hydrate

## 2016-10-05 LAB — BASIC METABOLIC PANEL WITH GFR
BUN: 16 mg/dL (ref 7–25)
CALCIUM: 9.4 mg/dL (ref 8.6–10.3)
CO2: 23 mmol/L (ref 20–31)
Chloride: 106 mmol/L (ref 98–110)
Creat: 1.09 mg/dL (ref 0.70–1.18)
GFR, EST AFRICAN AMERICAN: 77 mL/min (ref >=60)
GFR, EST NON AFRICAN AMERICAN: 67 mL/min (ref >=60)
Glucose, Bld: 94 mg/dL (ref 65–99)
POTASSIUM: 4.7 mmol/L (ref 3.5–5.3)
Sodium: 139 mmol/L (ref 135–146)

## 2016-10-05 LAB — HEMOGLOBIN A1C
Hgb A1c MFr Bld: 5.5 % (ref <5.7)
MEAN PLASMA GLUCOSE: 111 mg/dL

## 2017-01-20 DIAGNOSIS — M79602 Pain in left arm: Secondary | ICD-10-CM | POA: Insufficient documentation

## 2017-01-20 DIAGNOSIS — M25539 Pain in unspecified wrist: Secondary | ICD-10-CM | POA: Insufficient documentation

## 2017-01-20 DIAGNOSIS — M189 Osteoarthritis of first carpometacarpal joint, unspecified: Secondary | ICD-10-CM | POA: Diagnosis not present

## 2017-02-03 DIAGNOSIS — S8391XA Sprain of unspecified site of right knee, initial encounter: Secondary | ICD-10-CM | POA: Diagnosis not present

## 2017-02-22 DIAGNOSIS — M25561 Pain in right knee: Secondary | ICD-10-CM | POA: Diagnosis not present

## 2017-02-22 DIAGNOSIS — R609 Edema, unspecified: Secondary | ICD-10-CM | POA: Diagnosis not present

## 2017-02-22 DIAGNOSIS — M25661 Stiffness of right knee, not elsewhere classified: Secondary | ICD-10-CM | POA: Diagnosis not present

## 2017-02-24 ENCOUNTER — Ambulatory Visit (INDEPENDENT_AMBULATORY_CARE_PROVIDER_SITE_OTHER): Payer: PPO | Admitting: Cardiovascular Disease

## 2017-02-24 ENCOUNTER — Encounter: Payer: Self-pay | Admitting: Cardiovascular Disease

## 2017-02-24 VITALS — BP 110/70 | HR 51 | Ht 70.0 in | Wt 228.5 lb

## 2017-02-24 DIAGNOSIS — I10 Essential (primary) hypertension: Secondary | ICD-10-CM

## 2017-02-24 DIAGNOSIS — I2510 Atherosclerotic heart disease of native coronary artery without angina pectoris: Secondary | ICD-10-CM

## 2017-02-24 DIAGNOSIS — E782 Mixed hyperlipidemia: Secondary | ICD-10-CM

## 2017-02-24 NOTE — Progress Notes (Signed)
Cardiology Office Note  Date:  02/24/2017   ID:  Stephen Stein, DOB [birth year], MRN [id number]  PCP:  Enid Derry, MD   Chief Complaint  Patient presents with  . other    6 month f/u no complaints today. Meds reviewed verbally with pt.    HPI:  Stephen Stein is a very pleasant 76 -year-old gentleman with prior smoking history, stopped 20 years ago, hyperlipidemia, history of palpitations, treated with atenolol, initially presenting with abnormal EKG, had CT coronary calcium score with score around 800, stress testing showing no significant ischemia who presents for routine follow-up to go over his testing and coronary artery disease   Walks daily, Recently in [place] on a trail hurt his leg and wrist Has not been exercise recently but will be getting back into it Denies any significant chest pain or shortness of breath on exertion Tolerating Crestor 20 mg daily, total cholesterol down to 110, LDL 57  no other complaints Atenolol decreased on previous office visit for bradycardia, takes 25 mg daily Heart rate still low on today's visit  EKG on today's visit shows normal sinus rhythm with rate 51 bpm, no significant ST or T-wave changes  Other past medical history reviewed CT coronary calcium score, 800 He has three-vessel coronary artery disease, no significant aortic atherosclerosis  Previous stress test showing no significant ischemia.  Previous EKG, EKG on today's visit shows normal sinus rhythm with rate 52 bpm, unable to exclude old anterior MI Prior EKG with primary care shows similar finding of possible old anterior MI, Finding of prolonged QT and T-wave abnormality is not particularly impressive  He reports having stress test 15 or 20 years ago for irregular heartbeats, details unavailable Never had echocardiogram or cardiac catheterization  PMH:   has a past medical history of BPH (benign prostatic hyperplasia); Hyperlipidemia; Hypertension; and Irregular  heartbeat.  PSH:    Past Surgical History:  Procedure Laterality Date  . COLONOSCOPY  2008  . TONSILLECTOMY AND ADENOIDECTOMY  1955    Current Outpatient Prescriptions  Medication Sig Dispense Refill  . aspirin EC 81 MG tablet Take 1 tablet (81 mg total) by mouth daily.    [name] atenolol (TENORMIN) 25 MG tablet Take 1 tablet (25 mg total) by mouth 2 (two) times daily. 180 tablet 2  . fish oil-omega-3 fatty acids 1000 MG capsule Take 1 g by mouth daily.    . meloxicam (MOBIC) 15 MG tablet Take 1 tablet (15 mg total) by mouth daily as needed for pain. 90 tablet 0  . Multiple Vitamin (MULTIVITAMIN) tablet Take 1 tablet by mouth daily.    . rosuvastatin (CRESTOR) 40 MG tablet Take 1 tablet (40 mg total) by mouth daily. 90 tablet 3  . Saw Palmetto, Serenoa repens, (SAW PALMETTO PO) Take by mouth daily.     No current facility-administered medications for this visit.      Allergies:   Patient has no known allergies.   Social History:  The patient  reports that he quit smoking about 24 years ago. He quit smokeless tobacco use about 24 years ago. He reports that he drinks alcohol. He reports that he does not use drugs.   Family History:   family history includes Cancer in his mother; Hearing loss in his brother and sister; Heart disease in his father.    Review of Systems: Review of Systems  Constitutional: Negative.   Respiratory: Negative.   Cardiovascular: Negative.   Gastrointestinal: Negative.   Musculoskeletal: Negative.  Neurological: Negative.   Psychiatric/Behavioral: Negative.   All other systems reviewed and are negative.    PHYSICAL EXAM: VS:  BP 110/70 (BP Location: Left Arm, Patient Position: Sitting, Cuff Size: Normal)   Pulse (!) 51   Ht 5\' 10"  (1.778 m)   Wt 228 lb 8 oz (103.6 kg)   BMI 32.79 kg/m  , BMI Body mass index is 32.79 kg/m. GEN: Well nourished, well developed, in no acute distress  HEENT: normal  Neck: no JVD, carotid bruits, or masses Cardiac:  RRR; no murmurs, rubs, or gallops,no edema  Respiratory:  clear to auscultation bilaterally, normal work of breathing GI: soft, nontender, nondistended, + BS MS: no deformity or atrophy  Skin: warm and dry, no rash Neuro:  Strength and sensation are intact Psych: euthymic mood, full affect    Recent Labs: 06/01/2016: ALT 31 10/04/2016: BUN 16; Creat 1.09; Potassium 4.7; Sodium 139    Lipid Panel Lab Results  Component Value Date   CHOL 110 06/01/2016   HDL 39 (L) 06/01/2016   LDLCALC 59 06/01/2016   TRIG 58 06/01/2016      Wt Readings from Last 3 Encounters:  02/24/17 228 lb 8 oz (103.6 kg)  10/04/16 226 lb 8 oz (102.7 kg)  09/08/16 220 lb (99.8 kg)       ASSESSMENT AND PLAN:  Mixed hyperlipidemia - Plan: EKG 12-Lead Cholesterol is at goal on the current lipid regimen. No changes to the medications were made.  Essential hypertension - Plan: EKG 12-Lead Blood pressure is well controlled on today's visit. No changes made to the medications. We did mention he could cut the atenolol down to 12.5 mg daily if he felt symptomatic from his bradycardia  Coronary artery disease involving native coronary artery of native heart without angina pectoris - Plan: EKG 12-Lead Currently with no symptoms of angina. No further workup at this time. Continue current medication regimen.   Total encounter time more than 15 minutes  Greater than 50% was spent in counseling and coordination of care with the patient   Disposition:   F/U  12 months   Orders Placed This Encounter  Procedures  . EKG 12-Lead     Signed, Esmond Plants, M.D., Ph.D. 02/24/2017  [place], [place]

## 2017-02-24 NOTE — Patient Instructions (Addendum)

## 2017-02-28 DIAGNOSIS — M25561 Pain in right knee: Secondary | ICD-10-CM | POA: Diagnosis not present

## 2017-02-28 DIAGNOSIS — M25661 Stiffness of right knee, not elsewhere classified: Secondary | ICD-10-CM | POA: Diagnosis not present

## 2017-02-28 DIAGNOSIS — R609 Edema, unspecified: Secondary | ICD-10-CM | POA: Diagnosis not present

## 2017-04-05 ENCOUNTER — Ambulatory Visit: Payer: PPO | Admitting: Family Medicine

## 2017-04-12 DIAGNOSIS — H25011 Cortical age-related cataract, right eye: Secondary | ICD-10-CM | POA: Diagnosis not present

## 2017-04-17 ENCOUNTER — Ambulatory Visit: Payer: PPO | Admitting: Family Medicine

## 2017-04-19 ENCOUNTER — Encounter: Payer: Self-pay | Admitting: Family Medicine

## 2017-04-19 ENCOUNTER — Ambulatory Visit (INDEPENDENT_AMBULATORY_CARE_PROVIDER_SITE_OTHER): Payer: PPO | Admitting: Family Medicine

## 2017-04-19 VITALS — BP 120/62 | HR 61 | Temp 97.7°F | Resp 16 | Wt 231.0 lb

## 2017-04-19 DIAGNOSIS — R918 Other nonspecific abnormal finding of lung field: Secondary | ICD-10-CM | POA: Insufficient documentation

## 2017-04-19 DIAGNOSIS — I10 Essential (primary) hypertension: Secondary | ICD-10-CM

## 2017-04-19 DIAGNOSIS — E782 Mixed hyperlipidemia: Secondary | ICD-10-CM | POA: Diagnosis not present

## 2017-04-19 DIAGNOSIS — Z5181 Encounter for therapeutic drug level monitoring: Secondary | ICD-10-CM | POA: Diagnosis not present

## 2017-04-19 DIAGNOSIS — R739 Hyperglycemia, unspecified: Secondary | ICD-10-CM

## 2017-04-19 DIAGNOSIS — Z6833 Body mass index (BMI) 33.0-33.9, adult: Secondary | ICD-10-CM

## 2017-04-19 DIAGNOSIS — N400 Enlarged prostate without lower urinary tract symptoms: Secondary | ICD-10-CM | POA: Insufficient documentation

## 2017-04-19 DIAGNOSIS — I2510 Atherosclerotic heart disease of native coronary artery without angina pectoris: Secondary | ICD-10-CM | POA: Diagnosis not present

## 2017-04-19 DIAGNOSIS — N401 Enlarged prostate with lower urinary tract symptoms: Secondary | ICD-10-CM

## 2017-04-19 DIAGNOSIS — E6609 Other obesity due to excess calories: Secondary | ICD-10-CM

## 2017-04-19 NOTE — Assessment & Plan Note (Signed)
Well-controlled; try the DASH guidelines 

## 2017-04-19 NOTE — Assessment & Plan Note (Signed)
Check lipids 

## 2017-04-19 NOTE — Assessment & Plan Note (Signed)
Patient would love to be 210 pounds, shoot for that over 3-6 months

## 2017-04-19 NOTE — Assessment & Plan Note (Signed)
Used to have elevated sugar, but last 3 A1cs were normal; just check fasting glucose

## 2017-04-19 NOTE — Progress Notes (Signed)
BP 120/62   Pulse 61   Temp 97.7 F (36.5 C) (Oral)   Resp 16   Wt 231 lb (104.8 kg)   SpO2 94%   BMI 33.15 kg/m    Subjective:    Patient ID: Stephen Stein, male    DOB: August 05, 1941, 76 y.o.   MRN: [id number]  HPI: Stephen Stein is a 76 y.o. male  Chief Complaint  Patient presents with  . Follow-up    6 months    HPI Patient is here for f/u  HTN; well-controlled today; not checking away from doctor; not adding salt to food Has had mucous for 3 years; getting worse; used to smoke; talked to his previous doctor; quit 25 years ago; coughs up sticky glob of stuff up sometimes; used to install furnaces at age 89, coal furnaces in existence; replaced those with gas; big bonnet on furnace, had asbestos on top, he dragged the asbestos off the top We reviewed old CT calcium scoring report which showed 4 mm pulm nodules; recommendation for repeat scan if risk of cancer  Has allergies; using claritin  Since last visit, he saw Dr. 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 BPH (benign prostatic hyperplasia)   . Hyperlipidemia   . Hypertension   . Irregular heartbeat    been taking Atenolol for 20 years   Past Surgical History:  Procedure  Laterality Date  . COLONOSCOPY  2008  . TONSILLECTOMY AND ADENOIDECTOMY  1955   Family History  Problem Relation Age of Onset  . Cancer Mother     lung  . Heart disease Father   . Hearing loss Sister     coclear implant  . Hearing loss Brother   . Cancer Paternal Grandmother   . Cancer Son     colon cancer  . Colon cancer Neg Hx   . Stomach cancer Neg Hx    Social History  Substance Use Topics  . Smoking status: Former Smoker    Quit date: 06/22/1992  . Smokeless tobacco: Former Systems developer    Quit date: 06/22/1992  . Alcohol use 0.0 oz/week     Comment: occasional    Interim medical history since last visit reviewed. Allergies and medications reviewed  Review of Systems Per HPI unless specifically indicated above     Objective:    BP 120/62   Pulse 61   Temp 97.7 F (36.5 C) (Oral)   Resp 16   Wt 231 lb (104.8 kg)   SpO2 94%   BMI 33.15 kg/m   Wt Readings from Last 3 Encounters:  04/19/17 231 lb (104.8 kg)  02/24/17 228 lb 8 oz (103.6 kg)  10/04/16 226 lb 8 oz (102.7 kg)    Physical Exam  Constitutional:  He appears well-developed and well-nourished. No distress.  HENT:  Head: Normocephalic and atraumatic.  Eyes: EOM are normal. No scleral icterus.  Neck: No JVD present. No thyromegaly present.  Cardiovascular: Normal rate and regular rhythm.   Pulmonary/Chest: Effort normal and breath sounds normal.  Abdominal: Soft. Bowel sounds are normal. He exhibits no distension.  Musculoskeletal: He exhibits no edema.  Neurological: Coordination normal.  Skin: Skin is warm and dry. No pallor.  Psychiatric: He has a normal mood and affect. His behavior is normal. Judgment and thought content normal.   Results for orders placed or performed in visit on 10/04/16  Hemoglobin A1c  Result Value Ref Range   Hgb A1c MFr Bld 5.5 <5.7 %   Mean Plasma Glucose 111 mg/dL  BASIC METABOLIC PANEL WITH GFR  Result Value Ref Range   Sodium 139 135 - 146 mmol/L   Potassium 4.7 3.5  - 5.3 mmol/L   Chloride 106 98 - 110 mmol/L   CO2 23 20 - 31 mmol/L   Glucose, Bld 94 65 - 99 mg/dL   BUN 16 7 - 25 mg/dL   Creat 1.09 0.70 - 1.18 mg/dL   Calcium 9.4 8.6 - 10.3 mg/dL   GFR, Est African American 77 >=60 mL/min   GFR, Est Non African American 67 >=60 mL/min      Assessment & Plan:   Problem List Items Addressed This Visit      Cardiovascular and Mediastinum   Essential hypertension (Chronic)    Well-controlled; try the DASH guidelines      CAD (coronary artery disease) (Chronic)    Continue beta-blocker and aspirin and statin        Genitourinary   BPH (benign prostatic hyperplasia)    Patient not interested in PSA check, urology referral; he'll continue saw palmetto        Other   Pulmonary nodules - Primary    Check chest CT due to previous asbestos exposure      Relevant Orders   CT Chest W Contrast   Obesity    Patient would love to be 210 pounds, shoot for that over 3-6 months      Medication monitoring encounter   Relevant Orders   COMPLETE METABOLIC PANEL WITH GFR   CBC with Differential/Platelet   Hyperlipemia (Chronic)    Check lipids      Relevant Orders   Lipid panel   Hyperglycemia    Used to have elevated sugar, but last 3 A1cs were normal; just check fasting glucose          Follow up plan: Return in about 6 months (around 10/19/2017) for twenty minute follow-up with fasting labs.  An after-visit summary was printed and given to the patient at [place].  Please see the patient instructions which may contain other information and recommendations beyond what is mentioned above in the assessment and plan.  No orders of the defined types were placed in this encounter.   Orders Placed This Encounter  Procedures  . CT Chest W Contrast  . COMPLETE METABOLIC PANEL WITH GFR  . CBC with Differential/Platelet  . Lipid panel   Chart shows two PCV-13 vaccines given in years past, but not PPSV-23; asked staff to check with  patient, location of 2016 injection to see if that was indeed the PPSV-23

## 2017-04-19 NOTE — Assessment & Plan Note (Signed)
Check chest CT due to previous asbestos exposure

## 2017-04-19 NOTE — Assessment & Plan Note (Signed)
Patient not interested in PSA check, urology referral; he'll continue saw palmetto

## 2017-04-19 NOTE — Patient Instructions (Addendum)
Try going completely dairy free for one month to see if that helps the mucous Limit egg yolks to no more than 3 per week (recommendation) Try to limit saturated fats in your diet (bologna, hot dogs, barbeque, cheeseburgers, hamburgers, steak, bacon, sausage, cheese, etc.) and get more fresh fruits, vegetables, and whole grains Try to follow the DASH guidelines (DASH stands for Dietary Approaches to Stop Hypertension) Try to limit the sodium in your diet.  Ideally, consume less than 1.5 grams (less than 1,500mg ) per day. Do not add salt when cooking or at the table.  Check the sodium amount on labels when shopping, and choose items lower in sodium when given a choice. Avoid or limit foods that already contain a lot of sodium. Eat a diet rich in fruits and vegetables and whole grains. Check out the information at familydoctor.org entitled "Nutrition for Weight Loss: What You Need to Know about Fad Diets" Try to lose between 1-2 pounds per week by taking in fewer calories and burning off more calories You can succeed by limiting portions, limiting foods dense in calories and fat, becoming more active, and drinking 8 glasses of water a day (64 ounces) Don't skip meals, especially breakfast, as skipping meals may alter your metabolism Do not use over-the-counter weight loss pills or gimmicks that claim rapid weight loss A healthy BMI (or body mass index) is between 18.5 and 24.9 You can calculate your ideal BMI at the [place] website [employer]Mon[employer]fr

## 2017-04-19 NOTE — Assessment & Plan Note (Signed)
Continue beta-blocker and aspirin and statin

## 2017-04-20 LAB — CBC WITH DIFFERENTIAL/PLATELET
BASOS ABS: 62 {cells}/uL (ref 0–200)
Basophils Relative: 1 %
EOS ABS: 620 {cells}/uL — AB (ref 15–500)
Eosinophils Relative: 10 %
HCT: 42.7 % (ref 38.5–50.0)
Hemoglobin: 14.3 g/dL (ref 13.2–17.1)
Lymphocytes Relative: 32 %
Lymphs Abs: 1984 cells/uL (ref 850–3900)
MCH: 31.1 pg (ref 27.0–33.0)
MCHC: 33.5 g/dL (ref 32.0–36.0)
MCV: 92.8 fL (ref 80.0–100.0)
MONOS PCT: 10 %
MPV: 9.9 fL (ref 7.5–12.5)
Monocytes Absolute: 620 cells/uL (ref 200–950)
NEUTROS ABS: 2914 {cells}/uL (ref 1500–7800)
Neutrophils Relative %: 47 %
PLATELETS: 208 10*3/uL (ref 140–400)
RBC: 4.6 MIL/uL (ref 4.20–5.80)
RDW: 13.6 % (ref 11.0–15.0)
WBC: 6.2 10*3/uL (ref 3.8–10.8)

## 2017-04-20 LAB — COMPLETE METABOLIC PANEL WITH GFR
AG RATIO: 1.8 ratio (ref 1.0–2.5)
ALT: 34 U/L (ref 9–46)
AST: 31 U/L (ref 10–35)
Albumin: 4.4 g/dL (ref 3.6–5.1)
Alkaline Phosphatase: 19 U/L — ABNORMAL LOW (ref 40–115)
BILIRUBIN TOTAL: 0.6 mg/dL (ref 0.2–1.2)
BUN / CREAT RATIO: 12.2 ratio (ref 6–22)
BUN: 15 mg/dL (ref 7–25)
CO2: 19 mmol/L — ABNORMAL LOW (ref 20–31)
CREATININE: 1.23 mg/dL — AB (ref 0.70–1.18)
Calcium: 9.6 mg/dL (ref 8.6–10.3)
Chloride: 104 mmol/L (ref 98–110)
GFR, EST AFRICAN AMERICAN: 66 mL/min (ref >=60)
GFR, EST NON AFRICAN AMERICAN: 57 mL/min — AB (ref >=60)
GLOBULIN: 2.5 g/dL (ref 1.9–3.7)
GLUCOSE: 110 mg/dL — AB (ref 65–99)
POTASSIUM: 4.3 mmol/L (ref 3.5–5.3)
SODIUM: 139 mmol/L (ref 135–146)
Total Protein: 6.9 g/dL (ref 6.1–8.1)

## 2017-04-20 LAB — LIPID PANEL
CHOL/HDL RATIO: 3.9 ratio (ref <5.0)
Cholesterol: 118 mg/dL (ref <200)
HDL: 30 mg/dL — ABNORMAL LOW (ref >40)
LDL CALC: 57 mg/dL (ref <100)
Triglycerides: 155 mg/dL — ABNORMAL HIGH (ref <150)
VLDL: 31 mg/dL — ABNORMAL HIGH (ref <30)

## 2017-05-02 ENCOUNTER — Ambulatory Visit: Payer: PPO

## 2017-05-05 ENCOUNTER — Ambulatory Visit
Admission: RE | Admit: 2017-05-05 | Discharge: 2017-05-05 | Disposition: A | Discharge status: Home / Self Care | Payer: PPO | Source: Ambulatory Visit | Attending: Family Medicine | Admitting: Family Medicine

## 2017-05-05 DIAGNOSIS — K760 Fatty (change of) liver, not elsewhere classified: Secondary | ICD-10-CM | POA: Insufficient documentation

## 2017-05-05 DIAGNOSIS — D3502 Benign neoplasm of left adrenal gland: Secondary | ICD-10-CM | POA: Insufficient documentation

## 2017-05-05 DIAGNOSIS — I700 Atherosclerosis of aorta: Secondary | ICD-10-CM | POA: Diagnosis not present

## 2017-05-05 DIAGNOSIS — R918 Other nonspecific abnormal finding of lung field: Secondary | ICD-10-CM | POA: Insufficient documentation

## 2017-05-05 DIAGNOSIS — I2510 Atherosclerotic heart disease of native coronary artery without angina pectoris: Secondary | ICD-10-CM | POA: Insufficient documentation

## 2017-05-05 MED ORDER — IOPAMIDOL (ISOVUE-300) INJECTION 61%
75.0000 mL | Freq: Once | INTRAVENOUS | Status: AC | PRN
  Administered 2017-05-05: 75 mL via INTRAVENOUS

## 2017-05-09 ENCOUNTER — Encounter: Payer: Self-pay | Admitting: Family Medicine

## 2017-05-09 ENCOUNTER — Other Ambulatory Visit: Payer: Self-pay | Admitting: Family Medicine

## 2017-05-09 DIAGNOSIS — D3500 Benign neoplasm of unspecified adrenal gland: Secondary | ICD-10-CM | POA: Insufficient documentation

## 2017-05-09 HISTORY — DX: Benign neoplasm of unspecified adrenal gland: D35.00

## 2017-05-09 NOTE — Progress Notes (Signed)
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

## 2017-07-05 DIAGNOSIS — E279 Disorder of adrenal gland, unspecified: Secondary | ICD-10-CM | POA: Diagnosis not present

## 2017-10-19 ENCOUNTER — Ambulatory Visit (INDEPENDENT_AMBULATORY_CARE_PROVIDER_SITE_OTHER): Payer: PPO | Admitting: Family Medicine

## 2017-10-19 ENCOUNTER — Encounter: Payer: Self-pay | Admitting: Family Medicine

## 2017-10-19 VITALS — BP 118/64 | HR 62 | Temp 97.4°F | Resp 16 | Wt 221.5 lb

## 2017-10-19 DIAGNOSIS — E6609 Other obesity due to excess calories: Secondary | ICD-10-CM | POA: Diagnosis not present

## 2017-10-19 DIAGNOSIS — M19011 Primary osteoarthritis, right shoulder: Secondary | ICD-10-CM | POA: Diagnosis not present

## 2017-10-19 DIAGNOSIS — Z5181 Encounter for therapeutic drug level monitoring: Secondary | ICD-10-CM | POA: Diagnosis not present

## 2017-10-19 DIAGNOSIS — Z6831 Body mass index (BMI) 31.0-31.9, adult: Secondary | ICD-10-CM

## 2017-10-19 DIAGNOSIS — E782 Mixed hyperlipidemia: Secondary | ICD-10-CM

## 2017-10-19 DIAGNOSIS — I2510 Atherosclerotic heart disease of native coronary artery without angina pectoris: Secondary | ICD-10-CM

## 2017-10-19 DIAGNOSIS — R739 Hyperglycemia, unspecified: Secondary | ICD-10-CM | POA: Diagnosis not present

## 2017-10-19 DIAGNOSIS — N401 Enlarged prostate with lower urinary tract symptoms: Secondary | ICD-10-CM | POA: Diagnosis not present

## 2017-10-19 DIAGNOSIS — Z125 Encounter for screening for malignant neoplasm of prostate: Secondary | ICD-10-CM

## 2017-10-19 DIAGNOSIS — M19012 Primary osteoarthritis, left shoulder: Secondary | ICD-10-CM

## 2017-10-19 DIAGNOSIS — D3500 Benign neoplasm of unspecified adrenal gland: Secondary | ICD-10-CM

## 2017-10-19 DIAGNOSIS — I10 Essential (primary) hypertension: Secondary | ICD-10-CM

## 2017-10-19 NOTE — Assessment & Plan Note (Signed)
Check fasting lipids today; showed model of artery and athero

## 2017-10-19 NOTE — Assessment & Plan Note (Signed)
Check glucose (truly fasting) and A1c

## 2017-10-19 NOTE — Assessment & Plan Note (Signed)
Well-controlled, beta-blocker is more for the irregular beats than pressure

## 2017-10-19 NOTE — Assessment & Plan Note (Signed)
No chest pain; on aspirin and statin

## 2017-10-19 NOTE — Assessment & Plan Note (Addendum)
Monitored by urologist previously, no recent PSA; discussed usefulness of test, what he would do; we'll get that today

## 2017-10-19 NOTE — Patient Instructions (Addendum)
You can try drinking a full glass of water and snacking on celery and carrots 15-20 minutes before your big meals to "spoil your dinner" Do make sure to take your aspirin well away from the meloxicam; at least one hour before the meloxicam for maximum benefit of the platelets Try an anti-inflammatory diet

## 2017-10-19 NOTE — Progress Notes (Signed)
BP 118/64   Pulse 62   Temp (!) 97.4 F (36.3 C) (Oral)   Resp 16   Wt 221 lb 8 oz (100.5 kg)   SpO2 96%   BMI 31.78 kg/m    Subjective:    Patient ID: Stephen Stein, male    DOB: 30-Apr-1941, 76 y.o.   MRN: [id number]  HPI: Stephen Stein is a 76 y.o. male  Chief Complaint  Patient presents with  . Follow-up    6 month  . Medication Refill    HPI He is here for 6 month f/u He saw the endocrinologist, checked out the andrenal lesion and nothing to worry about He has high cholesterol; trying to avoid pastas and daily doughnuts; trying to eat a lot of fiber; on statin; limiting portions too; low HDL it was 30 last time Obesity; he has lost ten pounds since the last appointment; did well at first, then fell off the wagon; he doesn't realize when he is full; always hungry Hypertension; content with medicine Prostate enlargement; affects his stream; seeing urologist locally; symptoms are stable No chest pain or irregular heartbeat as long as he stays on the beta-blocker arthritis or something in his shoulders; sees orthopaedist; going on for many years; would cut and split his own wood to heat his home CAD; aspirin and NSAID well separated Hyperglycemia; came fasting today; father got diabetes in his 13s or 4s  Depression screen PHQ 2/9 10/19/2017 04/19/2017 10/04/2016 12/14/2015  Decreased Interest 0 0 0 0  Down, Depressed, Hopeless 0 0 0 0  PHQ - 2 Score 0 0 0 0    Relevant past medical, surgical, family and social history reviewed Past Medical History:  Diagnosis Date  . 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 BPH (benign prostatic hyperplasia)   . Hyperlipidemia   . Hypertension   . Irregular heartbeat    been taking Atenolol for 20 years   Past Surgical History:  Procedure Laterality Date  . COLONOSCOPY  2008  . TONSILLECTOMY AND ADENOIDECTOMY  1955   Family History  Problem Relation Age of Onset  . Cancer Mother        lung  .  Heart disease Father   . Hearing loss Sister        coclear implant  . Hearing loss Brother   . Cancer Paternal Grandmother   . Cancer Son        colon cancer  . Colon cancer Neg Hx   . Stomach cancer Neg Hx    Social History   Social History  . Marital status: Married    Spouse name: N/A  . Number of children: N/A  . Years of education: N/A   Occupational History  . Not on file.   Social History Main Topics  . Smoking status: Former Smoker    Quit date: 06/22/1992  . Smokeless tobacco: Former Systems developer    Quit date: 06/22/1992  . Alcohol use 0.0 oz/week     Comment: occasional  . Drug use: No  . Sexual activity: Not Currently   Other Topics Concern  . Not on file   Social History Narrative  . No narrative on file    Interim medical history since last visit reviewed. Allergies and medications reviewed  Review of Systems Per HPI unless specifically indicated above     Objective:    BP 118/64   Pulse 62   Temp (!) 97.4  F (36.3 C) (Oral)   Resp 16   Wt 221 lb 8 oz (100.5 kg)   SpO2 96%   BMI 31.78 kg/m   Wt Readings from Last 3 Encounters:  10/19/17 221 lb 8 oz (100.5 kg)  04/19/17 231 lb (104.8 kg)  02/24/17 228 lb 8 oz (103.6 kg)    Physical Exam  Constitutional: He appears well-developed and well-nourished. No distress.  HENT:  Head: Normocephalic and atraumatic.  Eyes: EOM are normal. No scleral icterus.  Neck: No thyromegaly present.  Cardiovascular: Normal rate and regular rhythm.   Pulmonary/Chest: Effort normal and breath sounds normal.  Abdominal: Soft. Bowel sounds are normal. He exhibits no distension. There is no tenderness.  Musculoskeletal: He exhibits no edema.  Neurological: Coordination normal.  Skin: Skin is warm and dry. No pallor.  Psychiatric: He has a normal mood and affect. His behavior is normal. Judgment and thought content normal.    Results for orders placed or performed in visit on 04/19/17  COMPLETE METABOLIC PANEL WITH  GFR  Result Value Ref Range   Sodium 139 135 - 146 mmol/L   Potassium 4.3 3.5 - 5.3 mmol/L   Chloride 104 98 - 110 mmol/L   CO2 19 (L) 20 - 31 mmol/L   Glucose, Bld 110 (H) 65 - 99 mg/dL   BUN 15 7 - 25 mg/dL   Creat 1.23 (H) 0.70 - 1.18 mg/dL   Total Bilirubin 0.6 0.2 - 1.2 mg/dL   Alkaline Phosphatase 19 (L) 40 - 115 U/L   AST 31 10 - 35 U/L   ALT 34 9 - 46 U/L   Total Protein 6.9 6.1 - 8.1 g/dL   Albumin 4.4 3.6 - 5.1 g/dL   Calcium 9.6 8.6 - 10.3 mg/dL   Globulin 2.5 1.9 - 3.7 g/dL   AG Ratio 1.8 1.0 - 2.5 Ratio   BUN/Creatinine Ratio 12.2 6 - 22 Ratio   GFR, Est African American 66 >=60 mL/min   GFR, Est Non African American 57 (L) >=60 mL/min  CBC with Differential/Platelet  Result Value Ref Range   WBC 6.2 3.8 - 10.8 K/uL   RBC 4.60 4.20 - 5.80 MIL/uL   Hemoglobin 14.3 13.2 - 17.1 g/dL   HCT 42.7 38.5 - 50.0 %   MCV 92.8 80.0 - 100.0 fL   MCH 31.1 27.0 - 33.0 pg   MCHC 33.5 32.0 - 36.0 g/dL   RDW 13.6 11.0 - 15.0 %   Platelets 208 140 - 400 K/uL   MPV 9.9 7.5 - 12.5 fL   Neutro Abs 2,914 1,500 - 7,800 cells/uL   Lymphs Abs 1,984 850 - 3,900 cells/uL   Monocytes Absolute 620 200 - 950 cells/uL   Eosinophils Absolute 620 (H) 15 - 500 cells/uL   Basophils Absolute 62 0 - 200 cells/uL   Neutrophils Relative % 47 %   Lymphocytes Relative 32 %   Monocytes Relative 10 %   Eosinophils Relative 10 %   Basophils Relative 1 %   Smear Review Criteria for review not met   Lipid panel  Result Value Ref Range   Cholesterol 118 <200 mg/dL   Triglycerides 155 (H) <150 mg/dL   HDL 30 (L) >40 mg/dL   Total CHOL/HDL Ratio 3.9 <5.0 Ratio   VLDL 31 (H) <30 mg/dL   LDL Cholesterol 57 <100 mg/dL      Assessment & Plan:   Problem List Items Addressed This Visit      Cardiovascular and Mediastinum  Essential hypertension (Chronic)    Well-controlled, beta-blocker is more for the irregular beats than pressure      Relevant Medications   atenolol (TENORMIN) 25 MG tablet    rosuvastatin (CRESTOR) 40 MG tablet   CAD (coronary artery disease) (Chronic)    No chest pain; on aspirin and statin      Relevant Medications   atenolol (TENORMIN) 25 MG tablet   rosuvastatin (CRESTOR) 40 MG tablet   Other Relevant Orders   Lipid panel     Endocrine   Adrenal adenoma    Already checked out by endocrinologist        Musculoskeletal and Integument   Bilateral shoulder region arthritis    On meloxicam PRN; suggested referral back to ortho any time this is bothersome enough        Genitourinary   BPH (benign prostatic hyperplasia)    Monitored by urologist previously, no recent PSA; discussed usefulness of test, what he would do; we'll get that today      Relevant Orders   PSA     Other   Medication monitoring encounter   Relevant Orders   COMPLETE METABOLIC PANEL WITH GFR   Obesity - Primary    Praise given and encouragement to get under 200 pounds      Hyperlipidemia    Check fasting lipids today; showed model of artery and athero      Relevant Medications   atenolol (TENORMIN) 25 MG tablet   rosuvastatin (CRESTOR) 40 MG tablet   Other Relevant Orders   Lipid panel   Hyperglycemia    Check glucose (truly fasting) and A1c      Relevant Orders   Hemoglobin A1c    Other Visit Diagnoses    Screening for prostate cancer       Relevant Orders   PSA       Follow up plan: Return in about 6 months (around 04/19/2018) for twenty minute follow-up with fasting labs; Medicare visit if desired.  An after-visit summary was printed and given to the patient at [place].  Please see the patient instructions which may contain other information and recommendations beyond what is mentioned above in the assessment and plan.  Meds ordered this encounter  Medications  . atenolol (TENORMIN) 25 MG tablet    Sig: Take 1 tablet (25 mg total) by mouth daily.  . rosuvastatin (CRESTOR) 40 MG tablet    Sig: Take 0.5 tablets (20 mg total) by mouth daily.     Orders Placed This Encounter  Procedures  . COMPLETE METABOLIC PANEL WITH GFR  . Lipid panel  . Hemoglobin A1c  . PSA

## 2017-10-19 NOTE — Assessment & Plan Note (Signed)
Praise given and encouragement to get under 200 pounds

## 2017-10-19 NOTE — Assessment & Plan Note (Signed)
On meloxicam PRN; suggested referral back to ortho any time this is bothersome enough

## 2017-10-19 NOTE — Assessment & Plan Note (Signed)
Already checked out by endocrinologist

## 2017-10-20 ENCOUNTER — Telehealth: Payer: Self-pay | Admitting: Family Medicine

## 2017-10-20 LAB — COMPLETE METABOLIC PANEL WITH GFR
AG RATIO: 1.7 (calc) (ref 1.0–2.5)
ALT: 28 U/L (ref 9–46)
AST: 28 U/L (ref 10–35)
Albumin: 4.3 g/dL (ref 3.6–5.1)
Alkaline phosphatase (APISO): 21 U/L — ABNORMAL LOW (ref 40–115)
BUN: 20 mg/dL (ref 7–25)
CALCIUM: 9.4 mg/dL (ref 8.6–10.3)
CO2: 27 mmol/L (ref 20–32)
CREATININE: 1.09 mg/dL (ref 0.70–1.18)
Chloride: 103 mmol/L (ref 98–110)
GFR, EST AFRICAN AMERICAN: 77 mL/min/{1.73_m2} (ref >=60)
GFR, EST NON AFRICAN AMERICAN: 66 mL/min/{1.73_m2} (ref >=60)
GLUCOSE: 99 mg/dL (ref 65–99)
Globulin: 2.6 g/dL (calc) (ref 1.9–3.7)
Potassium: 4.3 mmol/L (ref 3.5–5.3)
Sodium: 138 mmol/L (ref 135–146)
TOTAL PROTEIN: 6.9 g/dL (ref 6.1–8.1)
Total Bilirubin: 0.5 mg/dL (ref 0.2–1.2)

## 2017-10-20 LAB — HEMOGLOBIN A1C
EAG (MMOL/L): 6.3 (calc)
Hgb A1c MFr Bld: 5.6 % of total Hgb (ref <5.7)
Mean Plasma Glucose: 114 (calc)

## 2017-10-20 LAB — LIPID PANEL
CHOL/HDL RATIO: 2.7 (calc) (ref <5.0)
Cholesterol: 96 mg/dL (ref <200)
HDL: 35 mg/dL — ABNORMAL LOW (ref >40)
LDL CHOLESTEROL (CALC): 44 mg/dL
NON-HDL CHOLESTEROL (CALC): 61 mg/dL (ref <130)
TRIGLYCERIDES: 88 mg/dL (ref <150)

## 2017-10-20 LAB — PSA: PSA: 0.3 ng/mL (ref <=4.0)

## 2017-10-20 MED ORDER — MELOXICAM 15 MG PO TABS: 15.0000 mg | ORAL_TABLET | Freq: Every day | ORAL | 1 refills | Status: DC | PRN

## 2017-10-20 NOTE — Telephone Encounter (Signed)
Cr reviewed Refill sent Patient contacted

## 2017-10-20 NOTE — Telephone Encounter (Signed)
Copied from [place] [id number]. Topic: Inquiry >> Oct 20, 2017  2:26 PM Corie Chiquito, [place] wrote: Reason for CRM: Patient was seen yesterday by Dr. Sanda Klein. Patient doesn't have his Meloxicam and he has contacted the pharmacy and they don't have  it as well. Patient would like it if soemone could give him a call back about this matter

## 2017-10-20 NOTE — Telephone Encounter (Signed)
I spoke with patient and took care of this

## 2017-10-20 NOTE — Telephone Encounter (Signed)
Copied from [place] [id number]. Topic: Inquiry >> Oct 20, 2017  2:26 PM Corie Chiquito, [place] wrote: Reason for CRM: Patient was seen yesterday Dr. Sanda Klein. Patient doesn't have his Meloxicam and he has contacted the pharmacy as well and they don't have  it as well. Patient would like it if soemone could give him a call back about this matter

## 2017-10-20 NOTE — Telephone Encounter (Signed)
I do not see any mention of mobic in your note from this week's visit. Please advise.

## 2018-04-07 ENCOUNTER — Telehealth: Payer: Self-pay | Admitting: Family Medicine

## 2018-04-07 DIAGNOSIS — R918 Other nonspecific abnormal finding of lung field: Secondary | ICD-10-CM

## 2018-04-07 NOTE — Assessment & Plan Note (Signed)
Due for chest CT May 06, 2018

## 2018-04-07 NOTE — Telephone Encounter (Signed)
Please let patient know that it's coming time for his repeat chest CT I've put in the order on Saturday April 13th It's due on or after May 12th Give patient number to call to schedule his own scan Thank you

## 2018-04-10 ENCOUNTER — Encounter: Payer: Self-pay | Admitting: Family Medicine

## 2018-04-10 NOTE — Telephone Encounter (Signed)
Left detailed voicemail

## 2018-04-19 ENCOUNTER — Ambulatory Visit: Payer: PPO | Admitting: Family Medicine

## 2018-04-24 ENCOUNTER — Other Ambulatory Visit: Payer: Self-pay | Admitting: Family Medicine

## 2018-04-24 MED ORDER — RANITIDINE HCL 150 MG PO TABS: 150.0000 mg | ORAL_TABLET | Freq: Two times a day (BID) | ORAL | 1 refills | Status: DC

## 2018-04-24 NOTE — Telephone Encounter (Signed)
Please let pt know that if he is going to continue to use the meloxicam chronically, that it can cause kidney damage; it can also cause stomach ulcer and GI bleeding; I'll encourage him to start an H2 blocker like Zantac 150 mg BID

## 2018-04-26 NOTE — Telephone Encounter (Signed)
[facility]rong [facility] [place] He did not need any refills on his Meloxicam. [facility] [place].

## 2018-04-30 ENCOUNTER — Encounter: Payer: Self-pay | Admitting: Family Medicine

## 2018-06-13 ENCOUNTER — Ambulatory Visit: Payer: PPO

## 2018-06-15 ENCOUNTER — Encounter: Payer: Self-pay | Admitting: Family Medicine

## 2018-06-15 ENCOUNTER — Ambulatory Visit
Admission: RE | Admit: 2018-06-15 | Discharge: 2018-06-15 | Disposition: A | Discharge status: Home / Self Care | Payer: PPO | Source: Ambulatory Visit | Attending: Family Medicine | Admitting: Family Medicine

## 2018-06-15 DIAGNOSIS — R918 Other nonspecific abnormal finding of lung field: Secondary | ICD-10-CM | POA: Insufficient documentation

## 2018-06-15 DIAGNOSIS — I700 Atherosclerosis of aorta: Secondary | ICD-10-CM | POA: Insufficient documentation

## 2018-06-19 ENCOUNTER — Encounter: Payer: Self-pay | Admitting: Family Medicine

## 2018-06-19 ENCOUNTER — Ambulatory Visit (INDEPENDENT_AMBULATORY_CARE_PROVIDER_SITE_OTHER): Payer: PPO | Admitting: Family Medicine

## 2018-06-19 VITALS — BP 122/62 | HR 71 | Temp 97.8°F | Resp 14 | Ht 70.0 in | Wt 224.0 lb

## 2018-06-19 DIAGNOSIS — M79671 Pain in right foot: Secondary | ICD-10-CM | POA: Diagnosis not present

## 2018-06-19 DIAGNOSIS — N401 Enlarged prostate with lower urinary tract symptoms: Secondary | ICD-10-CM

## 2018-06-19 DIAGNOSIS — I10 Essential (primary) hypertension: Secondary | ICD-10-CM

## 2018-06-19 DIAGNOSIS — Z5181 Encounter for therapeutic drug level monitoring: Secondary | ICD-10-CM | POA: Diagnosis not present

## 2018-06-19 DIAGNOSIS — E6609 Other obesity due to excess calories: Secondary | ICD-10-CM

## 2018-06-19 DIAGNOSIS — Z6832 Body mass index (BMI) 32.0-32.9, adult: Secondary | ICD-10-CM

## 2018-06-19 DIAGNOSIS — M25511 Pain in right shoulder: Secondary | ICD-10-CM | POA: Diagnosis not present

## 2018-06-19 DIAGNOSIS — E782 Mixed hyperlipidemia: Secondary | ICD-10-CM | POA: Diagnosis not present

## 2018-06-19 DIAGNOSIS — M19012 Primary osteoarthritis, left shoulder: Secondary | ICD-10-CM

## 2018-06-19 DIAGNOSIS — R739 Hyperglycemia, unspecified: Secondary | ICD-10-CM

## 2018-06-19 DIAGNOSIS — I2510 Atherosclerotic heart disease of native coronary artery without angina pectoris: Secondary | ICD-10-CM

## 2018-06-19 DIAGNOSIS — R220 Localized swelling, mass and lump, head: Secondary | ICD-10-CM | POA: Diagnosis not present

## 2018-06-19 DIAGNOSIS — M19011 Primary osteoarthritis, right shoulder: Secondary | ICD-10-CM | POA: Diagnosis not present

## 2018-06-19 DIAGNOSIS — M25512 Pain in left shoulder: Secondary | ICD-10-CM

## 2018-06-19 DIAGNOSIS — G8929 Other chronic pain: Secondary | ICD-10-CM | POA: Diagnosis not present

## 2018-06-19 NOTE — Assessment & Plan Note (Signed)
Consider xrays in the future; will hold for now; refer to PT

## 2018-06-19 NOTE — Assessment & Plan Note (Signed)
Keep LDL less than 70

## 2018-06-19 NOTE — Assessment & Plan Note (Signed)
Patient does not wish to follow PSA

## 2018-06-19 NOTE — Patient Instructions (Addendum)
Someone should contact you about the physical therapy appointment Let's get labs today We'll have you see the [facility] doctor If you have not heard anything from my staff in a week about any orders/referrals/studies from today, please contact us here to follow-up [phone] Check out the information at [email or web address] entitled "Nutrition for Weight Loss: What You Need to Know about Fad Diets" Try to lose between 1-2 pounds per week by taking in fewer calories and burning off more calories You can succeed by limiting portions, limiting foods dense in calories and fat, becoming more active, and drinking 8 glasses of water a day (64 ounces) Don't skip meals, especially breakfast, as skipping meals may alter your metabolism Do not use over-the-counter weight loss pills or gimmicks that claim rapid weight loss A healthy BMI (or body mass index) is between 18.5 and 24.9 You can calculate your ideal BMI at the [place] website ClubMonetize.fr

## 2018-06-19 NOTE — Assessment & Plan Note (Signed)
No chest pain

## 2018-06-19 NOTE — Progress Notes (Signed)
BP 122/62   Pulse 71   Temp 97.8 F (36.6 C) (Oral)   Resp 14   Ht 5\' 10"  (1.778 m)   Wt 224 lb (101.6 kg)   SpO2 95%   BMI 32.14 kg/m    Subjective:    Patient ID: Stephen Stein, male    DOB: 1941-08-07, 77 y.o.   MRN: [id number]  HPI: Stephen Stein is a 77 y.o. male  Chief Complaint  Patient presents with  . Follow-up  . Foot Injury    onset 2 months right  . Shoulder Pain    bilateral   . 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 and worsens condition at times; takes a meloxicam when it gets bad; using acetaminophen the rest of the time; tried the ice on his heel; might feel good when he does it  Edema on the side of the face; left side; feels something there; no discomfort; wife first noticed a few weeks ago; no change in saliva; no pain with chewing; no change in hearing or ringing in ear  HTN; controlled today; does add a little salt  Coronary artery disease; no chest pain; taking aspirin; no hx of peptic ulcer; no bleeding or allergy to aspirin; no dyspepsia  Benign enlargement of prostate; takes saw palmetto; okay with not checking PSA  Hyperglycemia; some dry mouth; breathes through mouth all night long; mouth-breather, several years; no sleep apnea  High cholesterol; trying to avoid fatty meats   Depression screen [facility] 2/9 06/19/2018 10/19/2017 04/19/2017 10/04/2016 12/14/2015  Decreased Interest 0 0 0 0 0  Down, Depressed, Hopeless 0 0 0 0 0  PHQ - 2 Score 0 0 0 0 0    Relevant past medical, surgical, family and social history reviewed Past Medical History:    Diagnosis Date  . 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 BPH (benign prostatic hyperplasia)   . Hyperlipidemia   . Hypertension   . Irregular heartbeat    been taking Atenolol for 20 years   Past Surgical History:  Procedure Laterality Date  . COLONOSCOPY  2008  . TONSILLECTOMY AND ADENOIDECTOMY  1955   Family History  Problem Relation Age of Onset  . Cancer Mother        lung  . Heart disease Father   . Hearing loss Sister        coclear implant  . Hearing loss Brother   . Cancer Paternal Grandmother   . Cancer Son        colon cancer  . Colon cancer Neg Hx   . Stomach cancer Neg Hx    Social History   Tobacco Use  . Smoking status: Former Smoker    Last attempt to quit: 06/22/1992    Years since quitting: 26.0  . Smokeless tobacco: Former Systems developer    Quit date: 06/22/1992  Substance Use Topics  . Alcohol use: Yes    Alcohol/week: 0.0 oz  Comment: occasional  . Drug use: No    Interim medical history since last visit reviewed. Allergies and medications reviewed  Review of Systems  Cardiovascular: Negative for chest pain.  Musculoskeletal:       Shoulder pain (B), heel pain (R)  Skin: Positive for rash (laying on the floor working on property and thinks he got bit; several red spots on trunk; already getting better).   Per HPI unless specifically indicated above     Objective:    BP 122/62   Pulse 71   Temp 97.8 F (36.6 C) (Oral)   Resp 14   Ht 5\' 10"  (1.778 m)   Wt 224 lb (101.6 kg)   SpO2 95%   BMI 32.14 kg/m   Wt Readings from Last 3 Encounters:  06/19/18 224 lb (101.6 kg)  10/19/17 221 lb 8 oz (100.5 kg)  04/19/17 231 lb (104.8 kg)    Physical Exam  Constitutional: He appears well-developed and well-nourished. No distress.  HENT:  Head: Normocephalic and atraumatic.  Right Ear: Tympanic membrane and ear canal normal.  Left Ear: Tympanic membrane and ear canal normal.  Mouth/Throat: No oral lesions. No dental  abscesses.  Mild swelling left side lower face  Eyes: EOM are normal. No scleral icterus.  Neck: No thyromegaly present.  Cardiovascular: Normal rate and regular rhythm.  Pulmonary/Chest: Effort normal and breath sounds normal.  Abdominal: Soft. Bowel sounds are normal. He exhibits no distension.  Musculoskeletal: He exhibits no edema.       Right shoulder: He exhibits decreased range of motion.       Left shoulder: He exhibits decreased range of motion.  Lymphadenopathy:    He has no cervical adenopathy.  Neurological: Coordination normal.  Skin: Skin is warm and dry. No pallor.  Numerous erythematous papular lesions with some excoriations on the trunk  Psychiatric: He has a normal mood and affect. His behavior is normal. Judgment and thought content normal.    Results for orders placed or performed in visit on 10/19/17  COMPLETE METABOLIC PANEL WITH GFR  Result Value Ref Range   Glucose, Bld 99 65 - 99 mg/dL   BUN 20 7 - 25 mg/dL   Creat 1.09 0.70 - 1.18 mg/dL   GFR, Est Non African American 66 > OR = 60 mL/min/1.59m2   GFR, Est African American 77 > OR = 60 mL/min/1.30m2   BUN/Creatinine Ratio NOT APPLICABLE 6 - 22 (calc)   Sodium 138 135 - 146 mmol/L   Potassium 4.3 3.5 - 5.3 mmol/L   Chloride 103 98 - 110 mmol/L   CO2 27 20 - 32 mmol/L   Calcium 9.4 8.6 - 10.3 mg/dL   Total Protein 6.9 6.1 - 8.1 g/dL   Albumin 4.3 3.6 - 5.1 g/dL   Globulin 2.6 1.9 - 3.7 g/dL (calc)   AG Ratio 1.7 1.0 - 2.5 (calc)   Total Bilirubin 0.5 0.2 - 1.2 mg/dL   Alkaline phosphatase (APISO) 21 (L) 40 - 115 U/L   AST 28 10 - 35 U/L   ALT 28 9 - 46 U/L  Lipid panel  Result Value Ref Range   Cholesterol 96 <200 mg/dL   HDL 35 (L) >40 mg/dL   Triglycerides 88 <150 mg/dL   LDL Cholesterol (Calc) 44 mg/dL (calc)   Total CHOL/HDL Ratio 2.7 <5.0 (calc)   Non-HDL Cholesterol (Calc) 61 <130 mg/dL (calc)  Hemoglobin A1c  Result Value Ref Range   Hgb A1c MFr Bld 5.6 <5.7 % of  total Hgb   Mean Plasma  Glucose 114 (calc)   eAG (mmol/L) 6.3 (calc)  PSA  Result Value Ref Range   PSA 0.3 < OR = 4.0 ng/mL      Assessment & Plan:   Problem List Items Addressed This Visit      Cardiovascular and Mediastinum   Essential hypertension (Chronic)    Well-controlled      CAD (coronary artery disease) (Chronic)    No chest pain        Musculoskeletal and Integument   Bilateral shoulder region arthritis    Consider xrays in the future; will hold for now; refer to PT        Genitourinary   BPH (benign prostatic hyperplasia)    Patient does not wish to follow PSA        Other   Obesity    He will work on weight loss; see AVS      Medication monitoring encounter    Check liver and kidneys      Relevant Orders   COMPLETE METABOLIC PANEL WITH GFR   Hyperlipidemia    Keep LDL less than 70      Relevant Orders   Lipid panel   Hyperglycemia    Last A1c was excellent      Relevant Orders   Hemoglobin A1c    Other Visit Diagnoses    Heel pain, chronic, right    -  Primary   Relevant Orders   Ambulatory referral to Physical Therapy   Chronic pain of both shoulders       Relevant Orders   Ambulatory referral to Physical Therapy   Left facial swelling       refer to ENT   Relevant Orders   Ambulatory referral to ENT       Follow up plan: Return in about 6 months (around 12/19/2018) for follow-up visit with Dr. Sanda Klein.  An after-visit summary was printed and given to the patient at [place].  Please see the patient instructions which may contain other information and recommendations beyond what is mentioned above in the assessment and plan.  No orders of the defined types were placed in this encounter.   Orders Placed This Encounter  Procedures  . Hemoglobin A1c  . Lipid panel  . COMPLETE METABOLIC PANEL WITH GFR  . Ambulatory referral to Physical Therapy  . Ambulatory referral to ENT

## 2018-06-19 NOTE — Assessment & Plan Note (Signed)
Last A1c was excellent

## 2018-06-19 NOTE — Assessment & Plan Note (Signed)
He will work on weight loss; see AVS

## 2018-06-19 NOTE — Assessment & Plan Note (Signed)
Well controlled 

## 2018-06-19 NOTE — Assessment & Plan Note (Signed)
Check liver and kidneys 

## 2018-06-23 NOTE — Progress Notes (Signed)
Cardiology Office Note  Date:  06/25/2018   ID:  [name], DOB [birth year], MRN [id number]  PCP:  Arnetha Courser, MD   Chief Complaint  Patient presents with  . other    12 month follow up. Meds reviewed by the pt. verbally. "doing well."     HPI:  Mr. Arvie is a very pleasant 77 -year-old gentleman with  smoking history, stopped 20 years ago,  hyperlipidemia,  palpitations, treated with atenolol,   CT coronary calcium score with score  800,  stress testing showing no significant ischemia  who presents for routine follow-up Of his coronary artery disease   Walks daily,lots of travel Recently got back from [place] in [place] Denies any significant chest pain or shortness of breath on exertion  Tolerating Crestor 20 mg daily,  HAB1C 5.6 Total chol 96, LDL 44  Atenolol decreased on previous office visit for bradycardia, takes 25 mg daily  EKG personally reviewed by myself on todays visit shows normal sinus rhythm with rate 58 bpm,  New LBBB, second degree AV block type I  Other past medical history reviewed CT coronary calcium score, 800 He has three-vessel coronary artery disease, no significant aortic atherosclerosis  Previous stress test showing no significant ischemia.  Previous EKG, EKG on today's visit shows normal sinus rhythm with rate 52 bpm, unable to exclude old anterior MI Prior EKG with primary care shows similar finding of possible old anterior MI, Finding of prolonged QT and T-wave abnormality is not particularly impressive  He reports having stress test 15 or 20 years ago for irregular heartbeats, details unavailable Never had echocardiogram or cardiac catheterization  PMH:   has a past medical history of Adrenal adenoma (05/09/2017), BPH (benign prostatic hyperplasia), Hyperlipidemia, Hypertension, and Irregular heartbeat.  PSH:    Past Surgical History:  Procedure Laterality Date  . COLONOSCOPY  2008  . TONSILLECTOMY AND ADENOIDECTOMY   1955    Current Outpatient Medications  Medication Sig Dispense Refill  . aspirin EC 81 MG tablet Take 1 tablet (81 mg total) by mouth daily.    [name] atenolol (TENORMIN) 25 MG tablet Take 1 tablet (25 mg total) by mouth daily.    . meloxicam (MOBIC) 15 MG tablet TAKE 1 TABLET (15 MG TOTAL) BY MOUTH DAILY AS NEEDED FOR PAIN. 90 tablet 0  . Multiple Vitamin (MULTIVITAMIN) tablet Take 1 tablet by mouth daily.    . rosuvastatin (CRESTOR) 40 MG tablet Take 0.5 tablets (20 mg total) by mouth daily.    . Saw Palmetto, Serenoa repens, (SAW PALMETTO PO) Take by mouth daily.    . ranitidine (ZANTAC) 150 MG tablet Take 1 tablet (150 mg total) by mouth 2 (two) times daily. (Patient not taking: Reported on 06/25/2018) 180 tablet 1   No current facility-administered medications for this visit.      Allergies:   Patient has no known allergies.   Social History:  The patient  reports that he quit smoking about 26 years ago. He quit smokeless tobacco use about 26 years ago. He reports that he drinks alcohol. He reports that he does not use drugs.   Family History:   family history includes Cancer in his mother, paternal grandmother, and son; Hearing loss in his brother and sister; Heart disease in his father.    Review of Systems: Review of Systems  Constitutional: Negative.   Respiratory: Negative.   Cardiovascular: Positive for palpitations.  Gastrointestinal: Negative.   Musculoskeletal: Negative.   Neurological: Negative.  Psychiatric/Behavioral: Negative.   All other systems reviewed and are negative.    PHYSICAL EXAM: VS:  BP 132/68 (BP Location: Left Arm, Patient Position: Sitting, Cuff Size: Normal)   Ht 5\' 10"  (1.778 m)   Wt 226 lb 12 oz (102.9 kg)   BMI 32.54 kg/m  , BMI Body mass index is 32.54 kg/m. Constitutional:  oriented to person, place, and time. No distress.  HENT:  Head: Normocephalic and atraumatic.  Eyes:  no discharge. No scleral icterus.  Neck: Normal range of  motion. Neck supple. No JVD present.  Cardiovascular: Normal rate, regular rhythm, normal heart sounds and intact distal pulses. Exam reveals no gallop and no friction rub. No edema No murmur heard. Pulmonary/Chest: Effort normal and breath sounds normal. No stridor. No respiratory distress.  no wheezes.  no rales.  no tenderness.  Abdominal: Soft.  no distension.  no tenderness.  Musculoskeletal: Normal range of motion.  no  tenderness or deformity.  Neurological:  normal muscle tone. Coordination normal. No atrophy Skin: Skin is warm and dry. No rash noted. not diaphoretic.  Psychiatric:  normal mood and affect. behavior is normal. Thought content normal.    Recent Labs: 10/19/2017: ALT 28; BUN 20; Creat 1.09; Potassium 4.3; Sodium 138    Lipid Panel Lab Results  Component Value Date   CHOL 96 10/19/2017   HDL 35 (L) 10/19/2017   LDLCALC 44 10/19/2017   TRIG 88 10/19/2017      Wt Readings from Last 3 Encounters:  06/25/18 226 lb 12 oz (102.9 kg)  06/19/18 224 lb (101.6 kg)  10/19/17 221 lb 8 oz (100.5 kg)       ASSESSMENT AND PLAN:  sick sinus syndrome/eft bundle branch block  second-degree AV block type I, is asymptomatic New left Bundle branch block but reports he is not having any symptoms concerning for ischemia Previous stress test no ischemia He'll monitor his heart rate for now and for any symptoms. For Any new changes we'll hold her event monitor  Mixed hyperlipidemia - Plan: EKG 12-Lead Cholesterol is at goal on the current lipid regimen. No changes to the medications were made.stable  Essential hypertension - Plan: EKG 12-Lead Blood pressure is well controlled on today's visit. No changes made to the medications.  Coronary artery disease involving native coronary artery of native heart without angina pectoris - Plan: EKG 12-Lead Currently with no symptoms of angina. No further workup at this time. Continue current medication regimen. New left Bundle branch  block but with no anginal symptoms   Total encounter time more than 25 minutes  Greater than 50% was spent in counseling and coordination of care with the patient   Disposition:   F/U  12 months   Orders Placed This Encounter  Procedures  . EKG 12-Lead     Signed, Esmond Plants, M.D., Ph.D. 06/25/2018  [place], [place]

## 2018-06-25 ENCOUNTER — Encounter: Payer: Self-pay | Admitting: Cardiovascular Disease

## 2018-06-25 ENCOUNTER — Ambulatory Visit (INDEPENDENT_AMBULATORY_CARE_PROVIDER_SITE_OTHER): Payer: PPO | Admitting: Cardiovascular Disease

## 2018-06-25 VITALS — BP 132/68 | Ht 70.0 in | Wt 226.8 lb

## 2018-06-25 DIAGNOSIS — E782 Mixed hyperlipidemia: Secondary | ICD-10-CM | POA: Diagnosis not present

## 2018-06-25 DIAGNOSIS — I700 Atherosclerosis of aorta: Secondary | ICD-10-CM

## 2018-06-25 DIAGNOSIS — I447 Left bundle-branch block, unspecified: Secondary | ICD-10-CM | POA: Diagnosis not present

## 2018-06-25 DIAGNOSIS — I10 Essential (primary) hypertension: Secondary | ICD-10-CM | POA: Diagnosis not present

## 2018-06-25 DIAGNOSIS — I25118 Atherosclerotic heart disease of native coronary artery with other forms of angina pectoris: Secondary | ICD-10-CM

## 2018-06-25 DIAGNOSIS — I441 Atrioventricular block, second degree: Secondary | ICD-10-CM | POA: Diagnosis not present

## 2018-06-25 DIAGNOSIS — R739 Hyperglycemia, unspecified: Secondary | ICD-10-CM | POA: Diagnosis not present

## 2018-06-25 NOTE — Patient Instructions (Addendum)
Please monitor your heart rate  If it runs low, <50 bpm, call the office Call for ZIO 2 week monitor for symptoms  Medication Instructions:   No medication changes made  Labwork:  No new labs needed  Testing/Procedures:  No further testing at this time   Follow-Up: It was a pleasure seeing you in the office today. Please call us if you have new issues that need to be addressed before your next appt.  [phone]  Your physician wants you to follow-up in: 12 months.  You will receive a reminder letter in the mail two months in advance. If you don't receive a letter, please call our office to schedule the follow-up appointment.  If you need a refill on your cardiac medications before your next appointment, please call your pharmacy.  For educational health videos Log in to : [email or web address] Or : [email or web address], password : [email or web address]

## 2018-06-26 ENCOUNTER — Encounter: Payer: Self-pay | Admitting: Family Medicine

## 2018-06-26 DIAGNOSIS — R220 Localized swelling, mass and lump, head: Secondary | ICD-10-CM | POA: Diagnosis not present

## 2018-06-26 DIAGNOSIS — K1121 Acute sialoadenitis: Secondary | ICD-10-CM | POA: Diagnosis not present

## 2018-07-15 ENCOUNTER — Other Ambulatory Visit: Payer: Self-pay | Admitting: Family Medicine

## 2018-07-16 ENCOUNTER — Other Ambulatory Visit: Payer: Self-pay

## 2018-07-16 ENCOUNTER — Telehealth: Payer: Self-pay | Admitting: Cardiovascular Disease

## 2018-07-16 MED ORDER — ATENOLOL 25 MG PO TABS: 25.0000 mg | ORAL_TABLET | Freq: Every day | ORAL | 0 refills | Status: DC

## 2018-07-16 NOTE — Telephone Encounter (Signed)
atenolol (TENORMIN) 25 MG tablet 90 tablet 0 07/16/2018    Sig - Route: Take 1 tablet (25 mg total) by mouth daily. - Oral   Sent to pharmacy as: atenolol (TENORMIN) 25 MG tablet   E-Prescribing Status: Receipt confirmed by pharmacy (07/16/2018 11:39 AM EDT)   Pharmacy   [id number] - [place], [place] [address]

## 2018-07-16 NOTE — Telephone Encounter (Signed)
°*  STAT* If patient is at the pharmacy, call can be transferred to refill team.   1. Which medications need to be refilled? (please list name of each medication and dose if known) atenolol (TENORMIN) 25 MG  2. Which pharmacy/location (including street and city if local pharmacy) is [facility] sent to[facility] [place]  3. Do they need a 30 day or 90 day supply? 90 day   Please call if unable to fill

## 2018-07-18 ENCOUNTER — Encounter: Payer: Self-pay | Admitting: Physical Therapy

## 2018-07-18 ENCOUNTER — Ambulatory Visit: Payer: PPO | Attending: Family Medicine | Admitting: Physical Therapy

## 2018-07-18 ENCOUNTER — Other Ambulatory Visit: Payer: Self-pay

## 2018-07-18 DIAGNOSIS — M25511 Pain in right shoulder: Secondary | ICD-10-CM | POA: Diagnosis not present

## 2018-07-18 DIAGNOSIS — M25571 Pain in right ankle and joints of right foot: Secondary | ICD-10-CM

## 2018-07-18 DIAGNOSIS — G8929 Other chronic pain: Secondary | ICD-10-CM | POA: Diagnosis not present

## 2018-07-18 DIAGNOSIS — M25512 Pain in left shoulder: Secondary | ICD-10-CM | POA: Insufficient documentation

## 2018-07-18 DIAGNOSIS — M6281 Muscle weakness (generalized): Secondary | ICD-10-CM | POA: Diagnosis not present

## 2018-07-19 NOTE — Therapy (Signed)
[facility] [address] [place], [address] Phone: [phone]   Fax:  [phone]  Physical Therapy Evaluation  Patient Details  Name: Stephen Stein MRN: [id number] Date of Birth: [birth year] Referring Provider: Arnetha Courser MD   Encounter Date: 07/18/2018  PT End of Session - 07/18/18 2250    Visit Number  1    Number of Visits  12    Date for PT Re-Evaluation  08/29/18    Authorization Type  1/10 (progress note)    PT Start Time  1519    PT Stop Time  1615    PT Time Calculation (min)  56 min    Activity Tolerance  Patient tolerated treatment well    Behavior During Therapy  [facility] for tasks assessed/performed       Past Medical History:  Diagnosis Date  . 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 BPH (benign prostatic hyperplasia)   . Hyperlipidemia   . Hypertension   . Irregular heartbeat    been taking Atenolol for 20 years    Past Surgical History:  Procedure Laterality Date  . COLONOSCOPY  2008  . TONSILLECTOMY AND ADENOIDECTOMY  1955    There were no vitals filed for this visit.   Subjective Assessment - 07/18/18 1528    Subjective  Patient reports he has shoulder pain with movement and right heel pain intermittently     Pertinent History  bilateral shoulder pain for many years ~25 years and began as occasional pain and with rest the pain was relieved. He has been putting a floor down and has been putting a lot of force through his shoulders.      Limitations  Lifting;Walking;House hold activities;Other (comment) sleeping on left side (shoulder pain)     How long can you walk comfortably?  any distance is uncomfortable    Patient Stated Goals  to stop pain in heel and shoulder pain to improve function with UE use and walking     Currently in Pain?  Other (Comment) shoulder pain with movement worst pain 8/10; heel pain worst 6/10         [facility] PT  Assessment - 07/18/18 1542      Assessment   Medical Diagnosis  Chronic bilateral shoulder pain and right heel pain     Referring Provider  Arnetha Courser MD    Onset Date/Surgical Date  04/25/18 recent exacerbation of heel pain and shoulder pain for years    Hand Dominance  Right    Prior Therapy  no      Balance Screen   Has the patient fallen in the past 6 months  Yes outdoor hiking and tripping    How many times?  unsure    Has the patient had a decrease in activity level because of a fear of falling?   No    Is the patient reluctant to leave their home because of a fear of falling?   No      Home Environment   Living Environment  Private residence    Living Arrangements  Spouse/significant other plus 2 dogs    Type of [place] to enter    Entrance Stairs-Number of Steps  4 4 front/4 back    Entrance Stairs-Rails  Right;Left one rail in back    [place];Laundry or work  area in basement;Full bath on main level      Prior Function   Level of Independence  Independent    Vocation  Retired    Geophysicist/field seismologist of rental property  used to work in [employer], outdoor activities, wood activities      Cognition   Overall Cognitive Status  Within Functional Limits for tasks assessed      Observation/Other Assessments   Quick DASH   32% FADI 14%      Posture/Postural Control   Posture Comments  standing WNL bilateral feet with mild pronation       ROM / Strength   AROM / PROM / Strength  AROM;Strength      AROM   Overall AROM Comments  right ankle DF/PF eversion and inversion WNL without pain: Shoulders: AAROM: supine:  right/left: flexion: 85/135 with stiffness/pain limiting further motion: ER 50/40: IR 40/20       Strength   Overall Strength Comments  bilateral shoulders limited motion: gross strength mid range isometrics strong, non painful; elbow flexion/extension WNL; bilateral grip  strength strong and equal  Right ankle PF decreased as compared to left; right foot toe flexion decreased; extension decreased     Palpation   Palpation comment  decreased soft tissue elasticity with spasms palpable lats, subscapularis, pectoral muscles  Right LE calf with decreased muscle bulk as compared to left calf; right foot without tenderness along plantar fascia; right heel tender lateral/medial aspect     Ambulation/Gait   Gait Comments  WNL          Objective measurements completed on examination: See above findings.    Treatment:  Therapeutic exercise: patient performed with assistance, instruction, VC of therapist: Supine lying:  AAROM bilateral shoulder flexion, rotations x 5-10 reps each STM performed in conjunction with exercises to lats, subscapularis and pectoral muscles  Sitting: right ankle DF with toe flexion x 10 and PF with toe extension x 10  Patient response to treatment: patient demonstrated improved technique with exercises with minimal VC for correct alignment.           PT Education - 07/18/18 1900    Education Details  POC: HEP for self management of right heel pain: foot intrinsic exercises: scapular retraciton; AAROM bilateral shoulders supine lying    Person(s) Educated  Patient    Methods  Explanation;Demonstration;Verbal cues;Handout    Comprehension  Verbalized understanding;Returned demonstration;Verbal cues required          PT Long Term Goals - 07/18/18 2304      PT LONG TERM GOAL #1   Title  Patient will demonstrate improved functional use bilateral UE's with decreased pain as indicated by QuickDash score of 25% or less    Baseline  QuickDash 32%    Status  New    Target Date  08/08/18      PT LONG TERM GOAL #2   Title  Patient will demonstrate improved functional use bilateral UE's with decreased pain as indicated by QuickDash score of 15% or less    Baseline  QuickDash 32%    Status  New    Target Date  08/29/18       PT LONG TERM GOAL #3   Title  Improved FADI score to <10% demonstrating improved function with decreased right heel pain     Baseline  FADI 14%    Status  New    Target Date  08/29/18  PT LONG TERM GOAL #4   Title  Patient will be independent with home program for pain control, exercises for shoulders/UEs and right LE/ foot to allow transition to self management when discharged from physical therapy    Baseline  limited knowledge of appropriate pain control strategies, exercise progression without cuing and instruction    Status  New    Target Date  08/29/18             Plan - 07/18/18 2253    Clinical Impression Statement  Patient is a 77 year old right hand dominant male who presents with bilateral shoulder pain and right heel pain that limit full function with daily tasks using UE's and with walking. His QuickDash score 32% indicates moderate self perceived impairment and hid Foot/ankle disability index indicates mild self perceived impairment. He has limited AROM bilateral shoulders and decreased strength in right ankle. He has limited knowledge of appropriate pain control strategies, exercises and progression and will benefit from physical therapy intervention.     History and Personal Factors relevant to plan of care:  bilateral shoulder pain for many years ~25 years and began as occasional pain and with rest the pain was relieved. He has been putting a floor down and has been putting a lot of force through his shoulders.      Clinical Presentation  Evolving    Clinical Presentation due to:  worseing symptoms bilateral shoulders; limited ROM, strength and use of UE's     Clinical Decision Making  Moderate    Rehab Potential  Fair    Clinical Impairments Affecting Rehab Potential  chronic condition, arthritis    PT Frequency  2x / week    PT Duration  6 weeks    PT Treatment/Interventions  Cryotherapy;Electrical Stimulation;Ultrasound;Moist Heat;Iontophoresis 4mg /ml  Dexamethasone;Therapeutic activities;Therapeutic exercise;Patient/family education;Neuromuscular re-education;Manual techniques    PT Next Visit Plan  pain control, neuromuscular re-education; therapeutic exercise, manual techniques    PT Home Exercise Plan  toe intrinsics, AAROM shoulders, scapular retraction; pain control with heat/ice    Consulted and Agree with Plan of Care  Patient       Patient will benefit from skilled therapeutic intervention in order to improve the following deficits and impairments:  Pain, Increased muscle spasms, Decreased activity tolerance, Decreased endurance, Decreased range of motion, Decreased strength, Impaired perceived functional ability, Impaired UE functional use, Difficulty walking  Visit Diagnosis: Chronic left shoulder pain - Plan: PT plan of care cert/re-cert  Chronic right shoulder pain - Plan: PT plan of care cert/re-cert  Muscle weakness (generalized) - Plan: PT plan of care cert/re-cert  Pain in right ankle and joints of right foot - Plan: PT plan of care cert/re-cert     Problem List Patient Active Problem List   Diagnosis Date Noted  . Left bundle branch block 06/25/2018  . Second degree AV block, Mobitz type I 06/25/2018  . Adrenal adenoma 05/09/2017  . Pulmonary nodules 04/19/2017  . Medication monitoring encounter 04/19/2017  . BPH (benign prostatic hyperplasia) 04/19/2017  . Elevated serum creatinine 10/04/2016  . 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 CAD (coronary artery disease) 03/25/2016  . Abnormal EKG 12/16/2015  . Hyperlipidemia 07/07/2015  . Essential hypertension 07/07/2015  . Hyperglycemia 07/07/2015  . Obesity 07/07/2015    Jomarie Longs PT 07/19/2018, 11:13 PM  [facility] [address] [place], [address] Phone: [phone]   Fax:  [phone]  Name: Stephen Stein MRN:  [id number] Date of Birth: May 12, 1941

## 2018-07-20 ENCOUNTER — Other Ambulatory Visit: Payer: Self-pay | Admitting: Family Medicine

## 2018-07-24 ENCOUNTER — Ambulatory Visit: Payer: PPO | Admitting: Physical Therapy

## 2018-07-24 ENCOUNTER — Encounter: Payer: Self-pay | Admitting: Physical Therapy

## 2018-07-24 DIAGNOSIS — M25511 Pain in right shoulder: Secondary | ICD-10-CM

## 2018-07-24 DIAGNOSIS — M25512 Pain in left shoulder: Secondary | ICD-10-CM | POA: Diagnosis not present

## 2018-07-24 DIAGNOSIS — M6281 Muscle weakness (generalized): Secondary | ICD-10-CM

## 2018-07-24 DIAGNOSIS — G8929 Other chronic pain: Secondary | ICD-10-CM

## 2018-07-24 DIAGNOSIS — M25571 Pain in right ankle and joints of right foot: Secondary | ICD-10-CM

## 2018-07-24 NOTE — Therapy (Signed)
[facility] [address] [place], [address] Phone: [phone]   Fax:  [phone]  Physical Therapy Treatment  Patient Details  Name: Stephen Stein MRN: [id number] Date of Birth: Nov 04, 1941 Referring Provider: Arnetha Courser MD   Encounter Date: 07/24/2018  PT End of Session - 07/24/18 1153    Visit Number  2    Number of Visits  12    Date for PT Re-Evaluation  08/29/18    Authorization Type  2/10 (progress note)    PT Start Time  1108    PT Stop Time  1155    PT Time Calculation (min)  47 min    Activity Tolerance  Patient tolerated treatment well    Behavior During Therapy  [facility] for tasks assessed/performed       Past Medical History:  Diagnosis Date  . 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 BPH (benign prostatic hyperplasia)   . Hyperlipidemia   . Hypertension   . Irregular heartbeat    been taking Atenolol for 20 years    Past Surgical History:  Procedure Laterality Date  . COLONOSCOPY  2008  . TONSILLECTOMY AND ADENOIDECTOMY  1955    There were no vitals filed for this visit.  Subjective Assessment - 07/24/18 1121    Subjective  Patient reports she is about the same with left shoulder worse than right with pain    Pertinent History  bilateral shoulder pain for many years ~25 years and began as occasional pain and with rest the pain was relieved. He has been putting a floor down and has been putting a lot of force through his shoulders.      Limitations  Lifting;Walking;House hold activities;Other (comment) sleeping on left side (shoulder pain)     How long can you walk comfortably?  any distance is uncomfortable    Patient Stated Goals  to stop pain in heel and shoulder pain to improve function with UE use and walking     Currently in Pain?  Other (Comment)       Objective: Pre treatment: AROM shoulder flexion right 85 degrees; left 80  degrees Palpation: bilateral upper trapezius muscles with increased spasms and tenderness    Treatment:  Therapeutic exercise: patient performed with assistance, instruction, VC of therapist: scapular retraction sitting, standing with resistive band 10 reps each with VC for correct technique  Manual Therapy: 8 min STM performed to bilateral Upper trapezius muscles thoracic spine with patient seated in chair, superficial techniques to decrease spasms and improve ROM bilateral shoulders  Modalities: US/HVGS combination x 12 min to cervical spine paraspinal muscles and upper trapezius muscles, 1MHz continuous @ 1.4w/cm2 high volt to intensity for pain, spasms  Modalities: Electrical stimulation: Russian stim. 10/10 cycle applied (2) electrodes to bilateral periscapular muscles, lower trapezius with patient seated in chair with UE's supported; goal muscle re education  Patient response to treatment: patient demonstrated improved technique with exercises with minimal VC for correct alignment. improved AROM with less difficulty and pain left shoulder following treatment and able to raise left UE above shoulder level >90 degrees at end of session     PT Education - 07/24/18 1152    Education Details  HEP re assessed; educated in use of ultrasound and electrical stimulation    Person(s) Educated  Patient    Methods  Explanation;Demonstration;Verbal cues    Comprehension  Verbalized understanding;Returned demonstration;Verbal  cues required          PT Long Term Goals - 07/18/18 2304      PT LONG TERM GOAL #1   Title  Patient will demonstrate improved functional use bilateral UE's with decreased pain as indicated by QuickDash score of 25% or less    Baseline  QuickDash 32%    Status  New    Target Date  08/08/18      PT LONG TERM GOAL #2   Title  Patient will demonstrate improved functional use bilateral UE's with decreased pain as indicated by QuickDash score of 15% or less     Baseline  QuickDash 32%    Status  New    Target Date  08/29/18      PT LONG TERM GOAL #3   Title  Improved FADI score to <10% demonstrating improved function with decreased right heel pain     Baseline  FADI 14%    Status  New    Target Date  08/29/18      PT LONG TERM GOAL #4   Title  Patient will be independent with home program for pain control, exercises for shoulders/UEs and right LE/ foot to allow transition to self management when discharged from physical therapy    Baseline  limited knowledge of appropriate pain control strategies, exercise progression without cuing and instruction    Status  New    Target Date  08/29/18            Plan - 07/24/18 1156    Clinical Impression Statement  Patient demonstrated improved ROM with less pain left shoulder following US/estim. He verbalized good understanding of exercises and pain control for shoulders.     Rehab Potential  Fair    Clinical Impairments Affecting Rehab Potential  chronic condition, arthritis    PT Frequency  2x / week    PT Duration  6 weeks    PT Treatment/Interventions  Cryotherapy;Electrical Stimulation;Ultrasound;Moist Heat;Iontophoresis 4mg /ml Dexamethasone;Therapeutic activities;Therapeutic exercise;Patient/family education;Neuromuscular re-education;Manual techniques    PT Next Visit Plan  pain control, neuromuscular re-education; therapeutic exercise, manual techniques    PT Home Exercise Plan  toe intrinsics, AAROM shoulders, scapular retraction; pain control with heat/ice       Patient will benefit from skilled therapeutic intervention in order to improve the following deficits and impairments:  Pain, Increased muscle spasms, Decreased activity tolerance, Decreased endurance, Decreased range of motion, Decreased strength, Impaired perceived functional ability, Impaired UE functional use, Difficulty walking  Visit Diagnosis: Chronic left shoulder pain  Chronic right shoulder pain  Muscle weakness  (generalized)  Pain in right ankle and joints of right foot     Problem List Patient Active Problem List   Diagnosis Date Noted  . Left bundle branch block 06/25/2018  . Second degree AV block, Mobitz type I 06/25/2018  . Adrenal adenoma 05/09/2017  . Pulmonary nodules 04/19/2017  . Medication monitoring encounter 04/19/2017  . BPH (benign prostatic hyperplasia) 04/19/2017  . Elevated serum creatinine 10/04/2016  . 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 CAD (coronary artery disease) 03/25/2016  . Abnormal EKG 12/16/2015  . Hyperlipidemia 07/07/2015  . Essential hypertension 07/07/2015  . Hyperglycemia 07/07/2015  . Obesity 07/07/2015    Jomarie Longs PT 07/24/2018, 11:02 PM  [facility] [address], [place], [address] Phone: [phone]   Fax:  [phone]  Name: CORMAC WINT MRN: [id number] Date of Birth: July 18, 1941

## 2018-07-26 ENCOUNTER — Ambulatory Visit: Payer: PPO | Attending: Family Medicine | Admitting: Physical Therapy

## 2018-07-26 ENCOUNTER — Encounter: Payer: Self-pay | Admitting: Physical Therapy

## 2018-07-26 DIAGNOSIS — M25512 Pain in left shoulder: Secondary | ICD-10-CM | POA: Diagnosis not present

## 2018-07-26 DIAGNOSIS — M25511 Pain in right shoulder: Secondary | ICD-10-CM | POA: Diagnosis not present

## 2018-07-26 DIAGNOSIS — M6281 Muscle weakness (generalized): Secondary | ICD-10-CM | POA: Diagnosis not present

## 2018-07-26 DIAGNOSIS — M25571 Pain in right ankle and joints of right foot: Secondary | ICD-10-CM | POA: Insufficient documentation

## 2018-07-26 DIAGNOSIS — G8929 Other chronic pain: Secondary | ICD-10-CM | POA: Diagnosis not present

## 2018-07-26 NOTE — Therapy (Signed)
[facility] [address], [place], [address] Phone: [phone]   Fax:  [phone]  Physical Therapy Treatment  Patient Details  Name: Stephen Stein MRN: [id number] Date of Birth: 1941-08-14 Referring Provider: Arnetha Courser MD   Encounter Date: 07/26/2018  PT End of Session - 07/26/18 1653    Visit Number  3    Number of Visits  12    Date for PT Re-Evaluation  08/29/18    Authorization Type  3/10 (progress note)    PT Start Time  1648    PT Stop Time  1740    PT Time Calculation (min)  52 min    Activity Tolerance  Patient tolerated treatment well    Behavior During Therapy  [facility] for tasks assessed/performed       Past Medical History:  Diagnosis Date  . 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 BPH (benign prostatic hyperplasia)   . Hyperlipidemia   . Hypertension   . Irregular heartbeat    been taking Atenolol for 20 years    Past Surgical History:  Procedure Laterality Date  . COLONOSCOPY  2008  . TONSILLECTOMY AND ADENOIDECTOMY  1955    There were no vitals filed for this visit.  Subjective Assessment - 07/26/18 1651    Subjective  Patient reports he is stiff in his shoulders and felt better following previous session.     Pertinent History  bilateral shoulder pain for many years ~25 years and began as occasional pain and with rest the pain was relieved. He has been putting a floor down and has been putting a lot of force through his shoulders.      Limitations  Lifting;Walking;House hold activities;Other (comment) sleeping on left side (shoulder pain)     How long can you walk comfortably?  any distance is uncomfortable    Patient Stated Goals  to stop pain in heel and shoulder pain to improve function with UE use and walking     Currently in Pain?  Other (Comment) stiffness in both shoulders        Objective: Pre treatment: AROM shoulder flexion right 85  degrees; left 80 degrees Palpation: bilateral upper trapezius muscles with increased spasms and tenderness; spasms palpable bilateral subscapularis, lats     Treatment:  Therapeutic exercise: patient performed with assistance, instruction, VC of therapist: Supine: each UE AAROM with end range hold forward elevation, rotations x 5 reps each, each UE RS with each UE at 90 degrees elevation and neutral rotations x 10 reps each UE, each exercise   Manual Therapy: 8 min STM performed to bilateral shoulders: pectoral muscles, subcapularis, lats to decrease spasms and improve ROM bilateral shoulders   Modalities: US/HVGS combination x 12 min to cervical spine paraspinal muscles and upper trapezius muscles, 1MHz continuous @ 1.4w/cm2 high volt to intensity for pain, spasms   Modalities: Electrical stimulation: 15 min Russian stim. 10/10 cycle applied (2) electrodes to bilateral periscapular muscles, lower trapezius; goal muscle re education with patient seated in chair with UE's supported with moist heat (no charge) to same for pain: no adverse reactions noted  Patient response to treatment: Patient demonstrated improved technique with exercises with minimal VC for correct alignment. improved AAROM with less difficulty and pain in shoulders following treatment and able to raise left UE above shoulder level >90 degrees following treatment.        PT  Education - 07/26/18 1653    Education Details  re assessed HEP    Person(s) Educated  Patient    Methods  Explanation;Demonstration;Verbal cues    Comprehension  Verbalized understanding;Returned demonstration;Verbal cues required          PT Long Term Goals - 07/18/18 2304      PT LONG TERM GOAL #1   Title  Patient will demonstrate improved functional use bilateral UE's with decreased pain as indicated by QuickDash score of 25% or less    Baseline  QuickDash 32%    Status  New    Target Date  08/08/18      PT LONG TERM GOAL #2    Title  Patient will demonstrate improved functional use bilateral UE's with decreased pain as indicated by QuickDash score of 15% or less    Baseline  QuickDash 32%    Status  New    Target Date  08/29/18      PT LONG TERM GOAL #3   Title  Improved FADI score to <10% demonstrating improved function with decreased right heel pain     Baseline  FADI 14%    Status  New    Target Date  08/29/18      PT LONG TERM GOAL #4   Title  Patient will be independent with home program for pain control, exercises for shoulders/UEs and right LE/ foot to allow transition to self management when discharged from physical therapy    Baseline  limited knowledge of appropriate pain control strategies, exercise progression without cuing and instruction    Status  New    Target Date  08/29/18            Plan - 07/26/18 1858    Clinical Impression Statement  Patient continues to respond well to treatment with decreased pain in shoulders and able to put shirt on with less difficulty at end of session. He continues with severe limitations in ROM bilateral shoulders and will benefit from additional physical therapy intervention to achieve goals.     Rehab Potential  Fair    Clinical Impairments Affecting Rehab Potential  chronic condition, arthritis    PT Frequency  2x / week    PT Duration  6 weeks    PT Treatment/Interventions  Cryotherapy;Electrical Stimulation;Ultrasound;Moist Heat;Iontophoresis 4mg /ml Dexamethasone;Therapeutic activities;Therapeutic exercise;Patient/family education;Neuromuscular re-education;Manual techniques    PT Next Visit Plan  pain control, neuromuscular re-education; therapeutic exercise, manual techniques    PT Home Exercise Plan  toe intrinsics, AAROM shoulders, scapular retraction; pain control with heat/ice       Patient will benefit from skilled therapeutic intervention in order to improve the following deficits and impairments:  Pain, Increased muscle spasms, Decreased  activity tolerance, Decreased endurance, Decreased range of motion, Decreased strength, Impaired perceived functional ability, Impaired UE functional use, Difficulty walking  Visit Diagnosis: Chronic right shoulder pain  Chronic left shoulder pain  Muscle weakness (generalized)  Pain in right ankle and joints of right foot     Problem List Patient Active Problem List   Diagnosis Date Noted  . Left bundle branch block 06/25/2018  . Second degree AV block, Mobitz type I 06/25/2018  . Adrenal adenoma 05/09/2017  . Pulmonary nodules 04/19/2017  . Medication monitoring encounter 04/19/2017  . BPH (benign prostatic hyperplasia) 04/19/2017  . Elevated serum creatinine 10/04/2016  . 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 CAD (coronary artery disease) 03/25/2016  . Abnormal EKG 12/16/2015  . Hyperlipidemia 07/07/2015  . Essential hypertension  07/07/2015  . Hyperglycemia 07/07/2015  . Obesity 07/07/2015    Aldona Lento 07/26/2018, 7:01 PM  [facility] [address], [place], [address] Phone: [phone]   Fax:  [phone]  Name: Stephen Stein MRN: [id number] Date of Birth: Sep 11, 1941

## 2018-07-30 ENCOUNTER — Ambulatory Visit: Payer: PPO | Admitting: Physical Therapy

## 2018-07-30 ENCOUNTER — Encounter: Payer: Self-pay | Admitting: Physical Therapy

## 2018-07-30 DIAGNOSIS — M25512 Pain in left shoulder: Secondary | ICD-10-CM

## 2018-07-30 DIAGNOSIS — M6281 Muscle weakness (generalized): Secondary | ICD-10-CM

## 2018-07-30 DIAGNOSIS — M25511 Pain in right shoulder: Secondary | ICD-10-CM | POA: Diagnosis not present

## 2018-07-30 DIAGNOSIS — G8929 Other chronic pain: Secondary | ICD-10-CM

## 2018-07-30 DIAGNOSIS — M25571 Pain in right ankle and joints of right foot: Secondary | ICD-10-CM

## 2018-07-30 NOTE — Therapy (Signed)
[facility] [address], [place], [address] Phone: [phone]   Fax:  [phone]  Physical Therapy Treatment  Patient Details  Name: Stephen Stein MRN: [id number] Date of Birth: 05-07-1941 Referring Provider: Arnetha Courser MD   Encounter Date: 07/30/2018  PT End of Session - 07/30/18 1038    Visit Number  4    Number of Visits  12    Date for PT Re-Evaluation  08/29/18    Authorization Type  4/10 (progress note)    PT Start Time  1033    PT Stop Time  1116    PT Time Calculation (min)  43 min    Activity Tolerance  Patient tolerated treatment well    Behavior During Therapy  [facility] for tasks assessed/performed       Past Medical History:  Diagnosis Date  . 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 BPH (benign prostatic hyperplasia)   . Hyperlipidemia   . Hypertension   . Irregular heartbeat    been taking Atenolol for 20 years    Past Surgical History:  Procedure Laterality Date  . COLONOSCOPY  2008  . TONSILLECTOMY AND ADENOIDECTOMY  1955    There were no vitals filed for this visit.  Subjective Assessment - 07/30/18 1035    Subjective  Patient reports he is stiff in his shoulders with pain and felt better following previous session. He reports feeling about the same overall.     Pertinent History  bilateral shoulder pain for many years ~25 years and began as occasional pain and with rest the pain was relieved. He has been putting a floor down and has been putting a lot of force through his shoulders.      Limitations  Lifting;Walking;House hold activities;Other (comment) sleeping on left side (shoulder pain)     How long can you walk comfortably?  any distance is uncomfortable    Patient Stated Goals  to stop pain in heel and shoulder pain to improve function with UE use and walking     Currently in Pain?  Other (Comment) stiffness in both shoulders         Objective: Pre treatment: AROM shoulder flexion right 90 degrees; left 85 degrees Palpation: bilateral upper trapezius muscles with increased spasms and tenderness; spasms palpable bilateral subscapularis, lats     Treatment:  Therapeutic exercise: patient performed with assistance, instruction, VC of therapist: Supine: each UE AAROM with end range hold forward elevation right shoulder to 125 degrees; left to 110 degrees following repetition and mobilization of joint and soft tissue sitting:   upper trapezius stretches 3 x 10 second holds with MFR techniques following Manual STM/US  Pulleys instruction and patient returned demonstration for shoulder elevation with back to pulleys, seated in chair  Manual Therapy: 15 min STM performed to bilateral shoulders: pectoral muscles, subcapularis, lats to decrease spasms and improve ROM bilateral shoulders: GHJ mobilization lateral distraction grade 1-3 with inferior glides and AP glides x 10 reps; patient seated and supine lying    Modalities: US/HVGS combination x 15 min to cervical spine paraspinal muscles bilateral shoulders and upper trapezius muscles, 1MHz continuous @ 1.4w/cm2 high volt to intensity for pain, spasms    Patient response to treatment: Patient demonstrated improved flexibility into forward elevation at end of session following treatment. He was able to perform pulleys for ROM well following demonstration and with repetition.  PT Education - 07/30/18 1037    Education Details  re assessed HEP    Person(s) Educated  Patient    Methods  Explanation;Demonstration;Verbal cues    Comprehension  Verbalized understanding;Returned demonstration;Verbal cues required          PT Long Term Goals - 07/18/18 2304      PT LONG TERM GOAL #1   Title  Patient will demonstrate improved functional use bilateral UE's with decreased pain as indicated by QuickDash score of 25% or less    Baseline  QuickDash 32%    Status   New    Target Date  08/08/18      PT LONG TERM GOAL #2   Title  Patient will demonstrate improved functional use bilateral UE's with decreased pain as indicated by QuickDash score of 15% or less    Baseline  QuickDash 32%    Status  New    Target Date  08/29/18      PT LONG TERM GOAL #3   Title  Improved FADI score to <10% demonstrating improved function with decreased right heel pain     Baseline  FADI 14%    Status  New    Target Date  08/29/18      PT LONG TERM GOAL #4   Title  Patient will be independent with home program for pain control, exercises for shoulders/UEs and right LE/ foot to allow transition to self management when discharged from physical therapy    Baseline  limited knowledge of appropriate pain control strategies, exercise progression without cuing and instruction    Status  New    Target Date  08/29/18            Plan - 07/30/18 1141    Clinical Impression Statement  Patient continues with stiffness in bilateral shoulders with pain that limit full function with daily tasks. He is responding slowly to current treatment that may be partly due to he is performing home repairs putting in new floors. He should continue to benefit from physical therapy for pain control, ROM exercises to further improve mobility and assist with pain control.     Rehab Potential  Fair    Clinical Impairments Affecting Rehab Potential  chronic condition, arthritis    PT Frequency  2x / week    PT Duration  6 weeks    PT Treatment/Interventions  Cryotherapy;Electrical Stimulation;Ultrasound;Moist Heat;Iontophoresis 4mg /ml Dexamethasone;Therapeutic activities;Therapeutic exercise;Patient/family education;Neuromuscular re-education;Manual techniques    PT Next Visit Plan  pain control, neuromuscular re-education; therapeutic exercise, manual techniques    PT Home Exercise Plan  toe intrinsics, AAROM shoulders, scapular retraction; pain control with heat/ice; pulleys for home 5  miin/2x/day       Patient will benefit from skilled therapeutic intervention in order to improve the following deficits and impairments:  Pain, Increased muscle spasms, Decreased activity tolerance, Decreased endurance, Decreased range of motion, Decreased strength, Impaired perceived functional ability, Impaired UE functional use, Difficulty walking  Visit Diagnosis: Chronic right shoulder pain  Chronic left shoulder pain  Muscle weakness (generalized)  Pain in right ankle and joints of right foot     Problem List Patient Active Problem List   Diagnosis Date Noted  . Left bundle branch block 06/25/2018  . Second degree AV block, Mobitz type I 06/25/2018  . Adrenal adenoma 05/09/2017  . Pulmonary nodules 04/19/2017  . Medication monitoring encounter 04/19/2017  . BPH (benign prostatic hyperplasia) 04/19/2017  . Elevated serum creatinine 10/04/2016  . Bilateral shoulder region arthritis  10/04/2016  . CAD (coronary artery disease) 03/25/2016  . Abnormal EKG 12/16/2015  . Hyperlipidemia 07/07/2015  . Essential hypertension 07/07/2015  . Hyperglycemia 07/07/2015  . Obesity 07/07/2015    Jomarie Longs PT 07/30/2018, 11:44 AM  [facility] [address] [place], [address] Phone: [phone]   Fax:  [phone]  Name: Stephen Stein MRN: [id number] Date of Birth: June 20, 1941

## 2018-08-01 ENCOUNTER — Encounter: Payer: Self-pay | Admitting: Physical Therapy

## 2018-08-01 ENCOUNTER — Ambulatory Visit: Payer: PPO | Admitting: Physical Therapy

## 2018-08-01 DIAGNOSIS — M25512 Pain in left shoulder: Secondary | ICD-10-CM

## 2018-08-01 DIAGNOSIS — M25511 Pain in right shoulder: Secondary | ICD-10-CM | POA: Diagnosis not present

## 2018-08-01 DIAGNOSIS — M25571 Pain in right ankle and joints of right foot: Secondary | ICD-10-CM

## 2018-08-01 DIAGNOSIS — M6281 Muscle weakness (generalized): Secondary | ICD-10-CM

## 2018-08-01 DIAGNOSIS — G8929 Other chronic pain: Secondary | ICD-10-CM

## 2018-08-01 NOTE — Therapy (Signed)
[place] [facility] [address] [place], [address] Phone: [phone]   Fax:  [phone]  Physical Therapy Treatment  Patient Details  Name: Stephen Stein MRN: [id number] Date of Birth: 12-Jan-1941 Referring Provider: Arnetha Courser MD   Encounter Date: 08/01/2018  PT End of Session - 08/01/18 1438    Visit Number  5    Number of Visits  12    Date for PT Re-Evaluation  08/29/18    Authorization Type  5/10 (progress note)    PT Start Time  1433    PT Stop Time  1520    PT Time Calculation (min)  47 min    Activity Tolerance  Patient tolerated treatment well    Behavior During Therapy  [facility] for tasks assessed/performed       Past Medical History:  Diagnosis Date  . 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 BPH (benign prostatic hyperplasia)   . Hyperlipidemia   . Hypertension   . Irregular heartbeat    been taking Atenolol for 20 years    Past Surgical History:  Procedure Laterality Date  . COLONOSCOPY  2008  . TONSILLECTOMY AND ADENOIDECTOMY  1955    There were no vitals filed for this visit.  Subjective Assessment - 08/01/18 1435    Subjective  Patient reports he is finishing floor at the house and he has not had time to exercise since last session. chief concern today is right heel pain has exacerbation of symptoms and he would like to address this today.     Pertinent History  bilateral shoulder pain for many years ~25 years and began as occasional pain and with rest the pain was relieved. He has been putting a floor down and has been putting a lot of force through his shoulders.      Limitations  Lifting;Walking;House hold activities;Other (comment) sleeping on left side (shoulder pain)     How long can you walk comfortably?  any distance is uncomfortable    Patient Stated Goals  to stop pain in heel and shoulder pain to improve function with UE use and walking      Currently in Pain?  Other (Comment) right heel pain and bilateral shoulder pain mild          Objective: Pre treatment: AROM shoulder flexion right 90 degrees; left 90 degrees Palpation: right calf with decreased muscle bulk, moderate spasms lateral aspect and tender along Achilles tendon posterior aspect of right ankle   Treatment:  Therapeutic exercise: patient performed with assistance, instruction, VC of therapist: calf stretch off of balance stone performed/demonstration with instruction for home 3 x 15-30 seconds 2x/day pulleys to continue 2x/day   Manual Therapy: 10 min STM performed to right calf muscle with patient prone lying with LE supported on pillow; superficial and deep techniques for decreasing spasms and improving soft tissue mobility for improved ambualtion   Modalities: [place] x 15 min to bilateral shoulders, 1MHz continuous @ 1.4w/cm2 for pain and to improve ROM with less difficutly  Russian stim x 15 min to right calf, 10/10 cycle for muscle re education with patient seated in chair, LE supported on pillow; in conjuntion with iontophoresis  Iontophoresis (application 10 min)  with dexamethasone 4mg /ml @ 40 ma*min; applied small electrode to posterior aspect of right heel over tender area for pain; no adverse reactions noted   Patient response to treatment: Patient demonstrated  improved soft tissue elasticity with decreased tenderness and spasms by 50%. improved ability to raise UE's above shoulder level with less difficulty and improved ROM by 5 degrees or more at end of session.     PT Education - 08/01/18 1437    Education Details  calf stretches, HEP re assessed; continue with pulleys 2x/day and toe intrinsic exercises 10 reps 3x/day; educated in iontophoresis intervention with possible reactions including irritation of skin under electrodes; patient gave verbal consent to treatment.    Person(s) Educated  Patient    Methods  Explanation;Demonstration;Verbal cues     Comprehension  Verbalized understanding;Returned demonstration;Verbal cues required          PT Long Term Goals - 07/18/18 2304      PT LONG TERM GOAL #1   Title  Patient will demonstrate improved functional use bilateral UE's with decreased pain as indicated by QuickDash score of 25% or less    Baseline  QuickDash 32%    Status  New    Target Date  08/08/18      PT LONG TERM GOAL #2   Title  Patient will demonstrate improved functional use bilateral UE's with decreased pain as indicated by QuickDash score of 15% or less    Baseline  QuickDash 32%    Status  New    Target Date  08/29/18      PT LONG TERM GOAL #3   Title  Improved FADI score to <10% demonstrating improved function with decreased right heel pain     Baseline  FADI 14%    Status  New    Target Date  08/29/18      PT LONG TERM GOAL #4   Title  Patient will be independent with home program for pain control, exercises for shoulders/UEs and right LE/ foot to allow transition to self management when discharged from physical therapy    Baseline  limited knowledge of appropriate pain control strategies, exercise progression without cuing and instruction    Status  New    Target Date  08/29/18            Plan - 08/01/18 1712    Clinical Impression Statement  Patient responded well to treatment with decreased stiffness in shoulders and less pain in right heel at end of session. He demonstrates good understanding of home exercises and will benefit from additional physical therapy intervention to address pain and limitations in order to improve function.     Rehab Potential  Fair    Clinical Impairments Affecting Rehab Potential  chronic condition, arthritis    PT Frequency  2x / week    PT Duration  6 weeks    PT Treatment/Interventions  Cryotherapy;Electrical Stimulation;Ultrasound;Moist Heat;Iontophoresis 4mg /ml Dexamethasone;Therapeutic activities;Therapeutic exercise;Patient/family education;Neuromuscular  re-education;Manual techniques    PT Next Visit Plan  pain control, neuromuscular re-education; therapeutic exercise, manual techniques    PT Home Exercise Plan  toe intrinsics, AAROM shoulders, scapular retraction; pain control with heat/ice; pulleys for home 5 miin/2x/day       Patient will benefit from skilled therapeutic intervention in order to improve the following deficits and impairments:  Pain, Increased muscle spasms, Decreased activity tolerance, Decreased endurance, Decreased range of motion, Decreased strength, Impaired perceived functional ability, Impaired UE functional use, Difficulty walking  Visit Diagnosis: Chronic right shoulder pain  Chronic left shoulder pain  Muscle weakness (generalized)  Pain in right ankle and joints of right foot     Problem List Patient Active Problem List  Diagnosis Date Noted  . Left bundle branch block 06/25/2018  . Second degree AV block, Mobitz type I 06/25/2018  . Adrenal adenoma 05/09/2017  . Pulmonary nodules 04/19/2017  . Medication monitoring encounter 04/19/2017  . BPH (benign prostatic hyperplasia) 04/19/2017  . Elevated serum creatinine 10/04/2016  . 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 CAD (coronary artery disease) 03/25/2016  . Abnormal EKG 12/16/2015  . Hyperlipidemia 07/07/2015  . Essential hypertension 07/07/2015  . Hyperglycemia 07/07/2015  . Obesity 07/07/2015    Aldona Lento 08/01/2018, 10:22 PM  [facility] [address] [place], [address] Phone: [phone]   Fax:  [phone]  Name: Stephen Stein MRN: [id number] Date of Birth: [birth year]

## 2018-08-04 DIAGNOSIS — N51 Disorders of male genital organs in diseases classified elsewhere: Secondary | ICD-10-CM | POA: Diagnosis not present

## 2018-08-07 ENCOUNTER — Ambulatory Visit: Payer: PPO | Admitting: Physical Therapy

## 2018-08-07 ENCOUNTER — Encounter: Payer: Self-pay | Admitting: Nurse Practitioner

## 2018-08-07 ENCOUNTER — Ambulatory Visit (INDEPENDENT_AMBULATORY_CARE_PROVIDER_SITE_OTHER): Payer: PPO | Admitting: Nurse Practitioner

## 2018-08-07 VITALS — BP 102/52 | HR 65 | Temp 98.0°F | Resp 16 | Ht 70.0 in | Wt 214.7 lb

## 2018-08-07 DIAGNOSIS — R110 Nausea: Secondary | ICD-10-CM

## 2018-08-07 DIAGNOSIS — R739 Hyperglycemia, unspecified: Secondary | ICD-10-CM | POA: Diagnosis not present

## 2018-08-07 DIAGNOSIS — R6883 Chills (without fever): Secondary | ICD-10-CM | POA: Diagnosis not present

## 2018-08-07 DIAGNOSIS — R112 Nausea with vomiting, unspecified: Secondary | ICD-10-CM | POA: Diagnosis not present

## 2018-08-07 DIAGNOSIS — E782 Mixed hyperlipidemia: Secondary | ICD-10-CM | POA: Diagnosis not present

## 2018-08-07 DIAGNOSIS — R300 Dysuria: Secondary | ICD-10-CM

## 2018-08-07 DIAGNOSIS — R197 Diarrhea, unspecified: Secondary | ICD-10-CM

## 2018-08-07 DIAGNOSIS — R42 Dizziness and giddiness: Secondary | ICD-10-CM

## 2018-08-07 MED ORDER — ONDANSETRON 4 MG PO TBDP: 4.0000 mg | ORAL_TABLET | Freq: Three times a day (TID) | ORAL | 0 refills | Status: DC | PRN

## 2018-08-07 NOTE — Progress Notes (Addendum)
Name: Stephen Stein   MRN: [id number]    DOB: 1941-01-25   Date:08/07/2018       Progress Note  Subjective  Chief Complaint  Chief Complaint  Patient presents with  . Dysuria  . Dizziness  . Nausea  . Emesis    HPI  Patient endorses lightheadedness, dysuria, nausea,  and decreased appetite, and diarrhea for a week went to urgent care on Saturday and was started on bactrim took 5 doses and received a phone call stating that urine results were normal, PSA was 0.4. He noted he started having vomiting after clinic and was instructed to stop taking the antibiotic. Patient has vomited 3 times in the last 24 hours, diarrhea has started to form more and states is bristol 5. Was hungry this morning and had toast and bacon and was able to finish it and keep it down. States dysuria is now just a mild tingle. Overall feels fatigued but feels he is on the mend.    Patient Active Problem List   Diagnosis Date Noted  . Left bundle branch block 06/25/2018  . Second degree AV block, Mobitz type I 06/25/2018  . Adrenal adenoma 05/09/2017  . Pulmonary nodules 04/19/2017  . Medication monitoring encounter 04/19/2017  . BPH (benign prostatic hyperplasia) 04/19/2017  . Pain in wrist 01/20/2017  . Elevated serum creatinine 10/04/2016  . 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 CAD (coronary artery disease) 03/25/2016  . Abnormal EKG 12/16/2015  . Hyperlipidemia 07/07/2015  . Essential hypertension 07/07/2015  . Hyperglycemia 07/07/2015  . Obesity 07/07/2015    Past Medical History:  Diagnosis Date  . 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 BPH (benign prostatic hyperplasia)   . Hyperlipidemia   . Hypertension   . Irregular heartbeat    been taking Atenolol for 20 years    Past Surgical History:  Procedure Laterality Date  . COLONOSCOPY  2008  . TONSILLECTOMY AND ADENOIDECTOMY  1955    Social History   Tobacco Use  . Smoking status: Former Smoker     Last attempt to quit: 06/22/1992    Years since quitting: 26.1  . Smokeless tobacco: Former Systems developer    Quit date: 06/22/1992  Substance Use Topics  . Alcohol use: Yes    Alcohol/week: 0.0 standard drinks    Comment: occasional     Current Outpatient Medications:  .  aspirin EC 81 MG tablet, Take 1 tablet (81 mg total) by mouth daily., Disp: , Rfl:  .  atenolol (TENORMIN) 25 MG tablet, Take 1 tablet (25 mg total) by mouth daily., Disp: 90 tablet, Rfl: 0 .  meloxicam (MOBIC) 15 MG tablet, Take 1 tablet (15 mg total) by mouth daily as needed for pain. Caution: long-term may cause decrease in kidney function, Disp: 90 tablet, Rfl: 0 .  Multiple Vitamin (MULTIVITAMIN) tablet, Take 1 tablet by mouth daily., Disp: , Rfl:  .  rosuvastatin (CRESTOR) 40 MG tablet, Take 0.5 tablets (20 mg total) by mouth daily., Disp: , Rfl:  .  Saw Palmetto, Serenoa repens, (SAW PALMETTO PO), Take by mouth daily., Disp: , Rfl:  .  Influenza vac split quadrivalent PF (FLUZONE HIGH-DOSE) 0.5 ML injection, Inject 1 mL into the muscle once., Disp: , Rfl:  .  methylPREDNISolone (MEDROL DOSEPAK) 4 MG TBPK tablet, methylprednisolone 4 mg tablets in a dose pack, Disp: , Rfl:  .  ondansetron (ZOFRAN ODT) 4 MG disintegrating tablet, Take 1 tablet (4  mg total) by mouth every 8 (eight) hours as needed for nausea or vomiting., Disp: 25 tablet, Rfl: 0 .  ranitidine (ZANTAC) 150 MG tablet, Take 1 tablet (150 mg total) by mouth 2 (two) times daily. (Patient not taking: Reported on 08/07/2018), Disp: 180 tablet, Rfl: 1 .  sulfamethoxazole-trimethoprim (BACTRIM DS,SEPTRA DS) 800-160 MG tablet, Take 1 tablet by mouth 2 (two) times daily., Disp: , Rfl:   No Known Allergies  Review of Systems  Constitutional: Positive for chills and malaise/fatigue. Negative for diaphoresis.  HENT: Negative for congestion, ear pain, sinus pain and sore throat.   Eyes: Negative for double vision and discharge.  Respiratory: Negative for cough and  shortness of breath.   Cardiovascular: Negative for chest pain and palpitations.  Gastrointestinal: Positive for diarrhea, nausea and vomiting. Negative for abdominal pain and blood in stool.  Genitourinary: Positive for dysuria. Negative for frequency and hematuria.  Musculoskeletal: Negative for back pain and myalgias.  Skin: Negative for rash.  Neurological: Negative for dizziness and headaches.       States it is more lightheadedness- feeling woozy no spinning   Psychiatric/Behavioral: The patient is not nervous/anxious and does not have insomnia.      No other specific complaints in a complete review of systems (except as listed in HPI above).  Objective  Vitals:   08/07/18 0821 08/07/18 0902 08/07/18 0903 08/07/18 0904  BP: (!) 102/52     Pulse: 65     Resp: 16     Temp: 98 F (36.7 C)     TempSrc: Oral     SpO2: 98% 95% 98% 98%  Weight: 214 lb 11.2 oz (97.4 kg)     Height: 5\' 10"  (1.778 m)       Body mass index is 30.81 kg/m.   Nursing Note and Vital Signs reviewed.  Physical Exam  Constitutional: He appears well-developed and well-nourished. No distress.  HENT:  Head: Normocephalic and atraumatic.  Nose: Nose normal.  Mouth/Throat: Mucous membranes are dry. No oropharyngeal exudate.  Eyes: Pupils are equal, round, and reactive to light. Conjunctivae and EOM are normal.  Cardiovascular: Normal heart sounds.  Pulses:      Radial pulses are 2+ on the right side, and 2+ on the left side.       Posterior tibial pulses are 2+ on the right side, and 2+ on the left side.  Pulmonary/Chest: Effort normal and breath sounds normal.  Abdominal: Soft. Normal appearance and bowel sounds are normal. There is no tenderness. There is no rigidity and no CVA tenderness.  Neurological: He is alert. He has normal strength. No sensory deficit. Coordination and gait normal. GCS eye subscore is 4. GCS verbal subscore is 5. GCS motor subscore is 6.  Skin: Skin is warm, dry and intact.  Capillary refill takes 2 to 3 seconds. No rash noted. No cyanosis.  Good skin turgor   Psychiatric: He has a normal mood and affect. His speech is normal and behavior is normal. Judgment and thought content normal. Cognition and memory are normal.       No results found for this or any previous visit (from the past 48 hour(s)).  Assessment & Plan 1. Intractable vomiting with nausea, unspecified vomiting type Discussed the importance of hydration, advance diet slowly from full liquid to soft and bland foods. - CBC with Differential - COMPLETE METABOLIC PANEL WITH GFR - ondansetron (ZOFRAN ODT) 4 MG disintegrating tablet; Take 1 tablet (4 mg total) by mouth every 8 (eight)  hours as needed for nausea or vomiting.  Dispense: 25 tablet; Refill: 0  2. Nausea - COMPLETE METABOLIC PANEL WITH GFR - ondansetron (ZOFRAN ODT) 4 MG disintegrating tablet; Take 1 tablet (4 mg total) by mouth every 8 (eight) hours as needed for nausea or vomiting.  Dispense: 25 tablet; Refill: 0  3. Chills Discussed taking Tylenol as needed if having fevers.  No more than 3000 mg a day. follow-up in 2 to 3 days if unresolved. - CBC with Differential - COMPLETE METABOLIC PANEL WITH GFR  4. Dysuria Negative urinalysis and urine culture at urgent care 3 days ago.  Discussed repeat testing today, suspicion for UTI is low patient reports will let us know if symptoms are unimproved with hydration.  5. Lightheaded Appears to be orthostatic, happens when he is sitting and goes to standing.  Likely due to dehydration.  Discussed slowly standing or changing positions, and rehydration.  Discussed warning signs and reasons to go to ER. - Orthostatic vital signs: positive has lightheadedness with position changes.   6. Diarrhea of presumed infectious origin Improving from bristol 7 to [place] 5 states is not going as often either.  Discussed importance of hydration and OTC antidiarrheal medications to take sparingly as needed,  discussed potential for constipation.   -Red flags and when to present for emergency care or RTC including fever >101.48F, chest pain, shortness of breath, new/worsening/un-resolving symptoms,  reviewed with patient at time of visit. Follow up and care instructions discussed and provided in AVS.  ------------------------------- I have reviewed this encounter including the documentation in this note and/or discussed this patient with the provider, Suezanne Cheshire DNP AGNP-C. I am certifying that I agree with the content of this note as supervising physician. Enid Derry, [facility] 08/12/2018, 6:20 PM

## 2018-08-07 NOTE — Patient Instructions (Addendum)
Gastroenteritis (also known as stomach flu, although unrelated to influenza) is inflammation of the gastrointestinal tract, involving both the stomach and intestines. How did I get it? Gastroenteritis can have many causes, including viral or bacterial infections, medication reactions, food allergies, food/water poisoning or abuse of laxatives or alcohol. The duration and severity of the condition is relative to the illness. What are the symptoms? Symptoms can include fatigue, lack of appetite, abdominal growling and cramping, nausea, vomiting and/or diarrhea and are usually brief. Typically, no serious consequences occur and the condition resolves itself in a few days without medical treatment. How do I treat it? -Take nausea medicine as needed for nausea. If needed you can take loperamide (Imodium) for diarrhea, but avoid taking it for more than 2 days at a time as it can then lead to constipation.  -Drink fluids and get plenty of rest. Do not consume alcohol or caffeine. Avoid medications containing aspirin or ibuprofen, which may irritate your stomach, and do not take any medications by mouth unless directed by your medical care provider. 1. Drink clear liquids. Sip water/half-strength sports drinks or suck on ice chips. If you vomit using this treatment, do not take anything for 1 hour and start over again. 2. If you do not vomit fluids, you may progress to full-strength sports drinks; popsicles; clear broth; bouillon; decaf tea; clear apple juice; plain-flavored gelatin; and half-strength, clear, carbonated beverages without fizz (ginger ale, lemon-lime sodas, etc.). NOTE: To remove the fizz from soda, pour some into a glass and stir with a spoon. 3. As you become hungry, try moving to soft foods. Some examples include: saltine crackers, dry white bread/toast, bananas, apple sauce, plain white rice, soft cereals prepared with water, plain noodles and broth soups. Do not use sauces or condiments,  including butter. You may return to a normal diet as tolerated within 24 hours after recovery from vomiting. Recommended diets THINGS TO AVOID WHILE RECOVERING: . Alcohol  . Caffeine  . Dairy products  . Citrus products  . Fatty, greasy and/or fried foods  . Raw fruits and vegetables  . Aspirin  . Ibuprofen CLEAR LIQUID DIET: . Apple, grape or cranberry juice  . Kool-Aid  . Fruit punch  . Gatorade  . Ginger ale or 7UP  . Decaf tea  . Clear bouillon  . Jello  . Popsicles  . Fruit ice  . Salt FULL LIQUID DIET: . Cocoa  . Carbonated, decaf beverages  . Broth  . Strained, bland soups  . Cream of wheat or rice cereals  . [place]  . Vegetable juices  . Strained fruit juices or nectars  . Sherbets  . Honey  . Cinnamon  . Nutmeg  . Vanilla or other extracts CLEAR LIQUID DIET, PLUS: . Coffee  . White bread or toast  . Cooked or ready-to-eat cereal (no bran)  . Graham crackers  . Saltines  . Pasta or rice  . Soft, cooked vegetables  . Boiled or mashed potatoes  . Apple sauce  . Bananas or seedless melon  . Cooked or canned fruits  . Mild cheese or cottage cheese SOFT FULL LIQUID DIET, PLUS: . Soft-cooked, poached or hard-boiled or scrambled eggs  . Tender meat, fish or poultry  . Soft cake or cookies without nuts or raisins  . Butter, cream or margarine  . Jelly  SEEK MEDICAL TREATMENT IF: . You are unable to keep fluids down.  . You see blood or mucus in your stool.  . You vomit black   or dark red material.  . You have a fever of 101?F (38.33?C) or higher.  . You have localized and/or persistent abdominal pain.    

## 2018-08-08 LAB — CBC WITH DIFFERENTIAL/PLATELET
BASOS ABS: 28 {cells}/uL (ref 0–200)
Basophils Relative: 0.4 %
EOS PCT: 3.5 %
Eosinophils Absolute: 249 cells/uL (ref 15–500)
HEMATOCRIT: 43.7 % (ref 38.5–50.0)
Hemoglobin: 15.4 g/dL (ref 13.2–17.1)
Lymphs Abs: 1349 cells/uL (ref 850–3900)
MCH: 31.9 pg (ref 27.0–33.0)
MCHC: 35.2 g/dL (ref 32.0–36.0)
MCV: 90.5 fL (ref 80.0–100.0)
MONOS PCT: 9.1 %
MPV: 10 fL (ref 7.5–12.5)
NEUTROS PCT: 68 %
Neutro Abs: 4828 cells/uL (ref 1500–7800)
PLATELETS: 238 10*3/uL (ref 140–400)
RBC: 4.83 10*6/uL (ref 4.20–5.80)
RDW: 12.8 % (ref 11.0–15.0)
Total Lymphocyte: 19 %
WBC mixed population: 646 cells/uL (ref 200–950)
WBC: 7.1 10*3/uL (ref 3.8–10.8)

## 2018-08-08 LAB — COMPLETE METABOLIC PANEL WITH GFR
AG RATIO: 1.8 (calc) (ref 1.0–2.5)
ALBUMIN MSPROF: 4.8 g/dL (ref 3.6–5.1)
ALT: 27 U/L (ref 9–46)
AST: 22 U/L (ref 10–35)
Alkaline phosphatase (APISO): 25 U/L — ABNORMAL LOW (ref 40–115)
BUN/Creatinine Ratio: 14 (calc) (ref 6–22)
BUN: 21 mg/dL (ref 7–25)
CO2: 23 mmol/L (ref 20–32)
Calcium: 10.2 mg/dL (ref 8.6–10.3)
Chloride: 99 mmol/L (ref 98–110)
Creat: 1.51 mg/dL — ABNORMAL HIGH (ref 0.70–1.18)
GFR, Est African American: 51 mL/min/{1.73_m2} — ABNORMAL LOW (ref >=60)
GFR, Est Non African American: 44 mL/min/{1.73_m2} — ABNORMAL LOW (ref >=60)
GLOBULIN: 2.7 g/dL (ref 1.9–3.7)
Glucose, Bld: 98 mg/dL (ref 65–99)
Potassium: 4.4 mmol/L (ref 3.5–5.3)
SODIUM: 134 mmol/L — AB (ref 135–146)
Total Bilirubin: 0.6 mg/dL (ref 0.2–1.2)
Total Protein: 7.5 g/dL (ref 6.1–8.1)

## 2018-08-08 LAB — LIPID PANEL
CHOLESTEROL: 95 mg/dL (ref <200)
HDL: 33 mg/dL — AB (ref >40)
LDL Cholesterol (Calc): 45 mg/dL (calc)
Non-HDL Cholesterol (Calc): 62 mg/dL (calc) (ref <130)
Total CHOL/HDL Ratio: 2.9 (calc) (ref <5.0)
Triglycerides: 90 mg/dL (ref <150)

## 2018-08-08 LAB — HEMOGLOBIN A1C
HEMOGLOBIN A1C: 5.8 %{Hb} — AB (ref <5.7)
MEAN PLASMA GLUCOSE: 120 (calc)
eAG (mmol/L): 6.6 (calc)

## 2018-08-09 ENCOUNTER — Ambulatory Visit: Payer: PPO | Admitting: Physical Therapy

## 2018-08-10 ENCOUNTER — Telehealth: Payer: Self-pay | Admitting: Family Medicine

## 2018-08-10 ENCOUNTER — Other Ambulatory Visit: Payer: Self-pay

## 2018-08-10 ENCOUNTER — Encounter: Payer: Self-pay | Admitting: Nurse Practitioner

## 2018-08-10 ENCOUNTER — Encounter: Payer: Self-pay | Admitting: Emergency Medicine

## 2018-08-10 ENCOUNTER — Emergency Department
Admission: EM | Admit: 2018-08-10 | Discharge: 2018-08-10 | Disposition: A | Discharge status: Home / Self Care | Payer: PPO | Attending: Emergency Medicine | Admitting: Emergency Medicine

## 2018-08-10 ENCOUNTER — Ambulatory Visit (INDEPENDENT_AMBULATORY_CARE_PROVIDER_SITE_OTHER): Payer: PPO | Admitting: Nurse Practitioner

## 2018-08-10 VITALS — BP 122/70 | HR 74 | Temp 98.5°F | Ht 70.0 in | Wt 206.2 lb

## 2018-08-10 DIAGNOSIS — I10 Essential (primary) hypertension: Secondary | ICD-10-CM | POA: Diagnosis not present

## 2018-08-10 DIAGNOSIS — R197 Diarrhea, unspecified: Secondary | ICD-10-CM

## 2018-08-10 DIAGNOSIS — I2510 Atherosclerotic heart disease of native coronary artery without angina pectoris: Secondary | ICD-10-CM | POA: Diagnosis not present

## 2018-08-10 DIAGNOSIS — Z7982 Long term (current) use of aspirin: Secondary | ICD-10-CM | POA: Diagnosis not present

## 2018-08-10 DIAGNOSIS — R944 Abnormal results of kidney function studies: Secondary | ICD-10-CM

## 2018-08-10 DIAGNOSIS — Z87891 Personal history of nicotine dependence: Secondary | ICD-10-CM | POA: Insufficient documentation

## 2018-08-10 DIAGNOSIS — R112 Nausea with vomiting, unspecified: Secondary | ICD-10-CM | POA: Diagnosis not present

## 2018-08-10 DIAGNOSIS — R29818 Other symptoms and signs involving the nervous system: Secondary | ICD-10-CM

## 2018-08-10 DIAGNOSIS — E860 Dehydration: Secondary | ICD-10-CM | POA: Diagnosis not present

## 2018-08-10 DIAGNOSIS — Z79899 Other long term (current) drug therapy: Secondary | ICD-10-CM | POA: Insufficient documentation

## 2018-08-10 DIAGNOSIS — R42 Dizziness and giddiness: Secondary | ICD-10-CM | POA: Diagnosis not present

## 2018-08-10 LAB — COMPREHENSIVE METABOLIC PANEL
ALT: 32 U/L (ref 0–44)
AST: 27 U/L (ref 15–41)
Albumin: 4.8 g/dL (ref 3.5–5.0)
Alkaline Phosphatase: 23 U/L — ABNORMAL LOW (ref 38–126)
Anion gap: 9 (ref 5–15)
BILIRUBIN TOTAL: 0.8 mg/dL (ref 0.3–1.2)
BUN: 24 mg/dL — AB (ref 8–23)
CALCIUM: 9.7 mg/dL (ref 8.9–10.3)
CO2: 26 mmol/L (ref 22–32)
CREATININE: 1.33 mg/dL — AB (ref 0.61–1.24)
Chloride: 99 mmol/L (ref 98–111)
GFR calc Af Amer: 58 mL/min — ABNORMAL LOW (ref >60)
GFR, EST NON AFRICAN AMERICAN: 50 mL/min — AB (ref >60)
Glucose, Bld: 115 mg/dL — ABNORMAL HIGH (ref 70–99)
Potassium: 4.2 mmol/L (ref 3.5–5.1)
Sodium: 134 mmol/L — ABNORMAL LOW (ref 135–145)
TOTAL PROTEIN: 8 g/dL (ref 6.5–8.1)

## 2018-08-10 LAB — CBC
HCT: 43.7 % (ref 40.0–52.0)
Hemoglobin: 15.6 g/dL (ref 13.0–18.0)
MCH: 32.6 pg (ref 26.0–34.0)
MCHC: 35.7 g/dL (ref 32.0–36.0)
MCV: 91.3 fL (ref 80.0–100.0)
PLATELETS: 228 10*3/uL (ref 150–440)
RBC: 4.78 MIL/uL (ref 4.40–5.90)
RDW: 13.1 % (ref 11.5–14.5)
WBC: 8.3 10*3/uL (ref 3.8–10.6)

## 2018-08-10 LAB — URINALYSIS, COMPLETE (UACMP) WITH MICROSCOPIC
BILIRUBIN URINE: NEGATIVE
Bacteria, UA: NONE SEEN
Glucose, UA: NEGATIVE mg/dL
Ketones, ur: 5 mg/dL — AB
LEUKOCYTES UA: NEGATIVE
NITRITE: NEGATIVE
PH: 5 (ref 5.0–8.0)
Protein, ur: NEGATIVE mg/dL
Specific Gravity, Urine: 1.025 (ref 1.005–1.030)

## 2018-08-10 LAB — LIPASE, BLOOD: Lipase: 43 U/L (ref 11–51)

## 2018-08-10 NOTE — ED Provider Notes (Signed)
[facility] Emergency Department Provider Note ____________________________________________   First MD Initiated Contact with Patient 08/10/18 1751     (approximate)  I have reviewed the triage vital signs and the nursing notes.   HISTORY  Chief Complaint Nausea  HPI Stephen Stein is a 77 y.o. male with a history of hyperlipidemia as well as hypertension who has been having episodes of nausea vomiting and diarrhea over the past week who was presented to the emergency department today for recheck of his lab work.  He states he was sent in by his primary care doctor for recheck of his lab work because of decreased kidney function.  He says that he has not vomited or had any diarrhea since yesterday and has tolerated a meal as well as soup today without vomiting.  Also no diarrhea today.  Says that he has persistent lightheadedness but it is improving.  Denies any pain at this time.  Past Medical History:  Diagnosis Date  . 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 BPH (benign prostatic hyperplasia)   . Hyperlipidemia   . Hypertension   . Irregular heartbeat    been taking Atenolol for 20 years    Patient Active Problem List   Diagnosis Date Noted  . Left bundle branch block 06/25/2018  . Second degree AV block, Mobitz type I 06/25/2018  . Adrenal adenoma 05/09/2017  . Pulmonary nodules 04/19/2017  . Medication monitoring encounter 04/19/2017  . BPH (benign prostatic hyperplasia) 04/19/2017  . Pain in wrist 01/20/2017  . Elevated serum creatinine 10/04/2016  . 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 CAD (coronary artery disease) 03/25/2016  . Abnormal EKG 12/16/2015  . Hyperlipidemia 07/07/2015  . Essential hypertension 07/07/2015  . Hyperglycemia 07/07/2015  . Obesity 07/07/2015    Past Surgical History:  Procedure Laterality Date  . COLONOSCOPY  2008  . TONSILLECTOMY AND ADENOIDECTOMY  1955    Prior to  Admission medications   Medication Sig Start Date End Date Taking? Authorizing Provider  aspirin EC 81 MG tablet Take 1 tablet (81 mg total) by mouth daily. 10/04/16  Yes Lada, Satira Anis, MD  atenolol (TENORMIN) 25 MG tablet Take 1 tablet (25 mg total) by mouth daily. 07/16/18  Yes Gollan, Kathlene November, MD  meloxicam (MOBIC) 15 MG tablet Take 1 tablet (15 mg total) by mouth daily as needed for pain. Caution: long-term may cause decrease in kidney function 07/20/18  Yes Lada, Satira Anis, MD  Multiple Vitamin (MULTIVITAMIN) tablet Take 1 tablet by mouth daily.   Yes [provider]  rosuvastatin (CRESTOR) 20 MG tablet Take 20 mg by mouth daily as needed.  10/19/17  Yes Lada, Satira Anis, MD  Saw Palmetto, Serenoa repens, (SAW PALMETTO PO) Take 1 tablet by mouth daily.    Yes [provider]  ondansetron (ZOFRAN ODT) 4 MG disintegrating tablet Take 1 tablet (4 mg total) by mouth every 8 (eight) hours as needed for nausea or vomiting. 08/07/18   Poulose, Bethel Born, NP    Allergies Patient has no known allergies.  Family History  Problem Relation Age of Onset  . Cancer Mother        lung  . Heart disease Father   . Hearing loss Sister        coclear implant  . Hearing loss Brother   . Cancer Paternal Grandmother   . Cancer Son        colon  cancer  . Colon cancer Neg Hx   . Stomach cancer Neg Hx     Social History Social History   Tobacco Use  . Smoking status: Former Smoker    Last attempt to quit: 06/22/1992    Years since quitting: 26.1  . Smokeless tobacco: Former Systems developer    Quit date: 06/22/1992  Substance Use Topics  . Alcohol use: Yes    Alcohol/week: 0.0 standard drinks    Comment: occasional  . Drug use: No    Review of Systems  Constitutional: No fever/chills Eyes: No visual changes. ENT: No sore throat. Cardiovascular: Denies chest pain. Respiratory: Denies shortness of breath. Gastrointestinal: No abdominal pain.    No constipation. Genitourinary:  Negative for dysuria. Musculoskeletal: Negative for back pain. Skin: Negative for rash. Neurological: Negative for headaches, focal weakness or numbness.   ____________________________________________   PHYSICAL EXAM:  VITAL SIGNS: ED Triage Vitals  Enc Vitals Group     BP 08/10/18 1456 132/67     Pulse Rate 08/10/18 1456 61     Resp 08/10/18 1456 16     Temp 08/10/18 1456 97.7 F (36.5 C)     Temp Source 08/10/18 1456 Oral     SpO2 08/10/18 1456 98 %     Weight --      Height --      Head Circumference --      Peak Flow --      Pain Score 08/10/18 1455 0     Pain Loc --      Pain Edu? --      Excl. in [place]? --     Constitutional: Alert and oriented. Well appearing and in no acute distress. Eyes: Conjunctivae are normal.  Head: Atraumatic. Nose: No congestion/rhinnorhea. Mouth/Throat: Mucous membranes are moist.  Neck: No stridor.   Cardiovascular: Normal rate, regular rhythm. Grossly normal heart sounds.  Respiratory: Normal respiratory effort.  No retractions. Lungs CTAB. Gastrointestinal: Soft and nontender. No distention. Musculoskeletal: No lower extremity tenderness nor edema.  No joint effusions. Neurologic:  Normal speech and language. No gross focal neurologic deficits are appreciated. Skin:  Skin is warm, dry and intact. No rash noted. Psychiatric: Mood and affect are normal. Speech and behavior are normal.  ____________________________________________   LABS (all labs ordered are listed, but only abnormal results are displayed)  Labs Reviewed  COMPREHENSIVE METABOLIC PANEL - Abnormal; Notable for the following components:      Result Value   Sodium 134 (*)    Glucose, Bld 115 (*)    BUN 24 (*)    Creatinine, Ser 1.33 (*)    Alkaline Phosphatase 23 (*)    GFR calc non Af Amer 50 (*)    GFR calc Af Amer 58 (*)    All other components within normal limits  URINALYSIS, COMPLETE (UACMP) WITH MICROSCOPIC - Abnormal; Notable for the following components:    Color, Urine YELLOW (*)    APPearance CLEAR (*)    Hgb urine dipstick SMALL (*)    Ketones, ur 5 (*)    All other components within normal limits  LIPASE, BLOOD  CBC   ____________________________________________  EKG   ____________________________________________  RADIOLOGY   ____________________________________________   PROCEDURES  Procedure(s) performed:   Procedures  Critical Care performed:   ____________________________________________   INITIAL IMPRESSION / ASSESSMENT AND PLAN / ED COURSE  Pertinent labs & imaging results that were available during my care of the patient were reviewed by me and considered in my medical  decision making (see chart for details).  DDX: Dehydration, less light abnormality, kidney failure, UTI, anemia As part of my medical decision making, I reviewed the following data within the [facility] Notes from prior ED visits  Patient with labs ultimately back to normal but with improvement in the kidney function.  Patient encouraged to continue hydration as well as follow-up with his primary care doctor.  Lightheadedness improving.  Says that he feels lightheaded especially when he stands up but it is only minimal at this time.  Patient sitting at the bedside after we discussed discharge and does not appear unsteady on his feet or in any distress. ____________________________________________   FINAL CLINICAL IMPRESSION(S) / ED DIAGNOSES  Dehydration.  Nausea vomiting diarrhea.  NEW MEDICATIONS STARTED DURING THIS VISIT:  New Prescriptions   No medications on file     Note:  This document was prepared using Dragon voice recognition software and may include unintentional dictation errors.     Orbie Pyo, MD 08/10/18 [phone]

## 2018-08-10 NOTE — Progress Notes (Signed)
Name: Stephen Stein   MRN: [id number]    DOB: [birth year]   Date:08/10/2018       Progress Note  Subjective  Chief Complaint  Chief Complaint  Patient presents with  . Nausea    and lightheaded    HPI  Was seen in office for nausea, vomiting, decreased appetite and diarrhea which had been improving on Tuesday. Patient presents today stating nausea medicine helped some but has still vomiting 1-2 times a day- states just liquid. Today he has not vomited yet had oatmeal around breakfast time and chicken soup for lunch. Has not had any diarrhea; just passing a lot of gas, had a small BM yesterday. Last time he vomited was around 6:30. Drinking some electrolyte drink and 1-2 bottles of water a day. States urine is yellow not dark or light. Feels dizzy with position changes and then sometimes throughout the day- was not orthostatic last visit; BP improved today. When he wakes up he doesn't have any lightheadedness or nausea but as soon as he gets up out of bed the symptoms start and he feels off balance. Also notes some abdominal pain after eating these past few days. No chest pain, shob, fevers, chills.   Patient Active Problem List   Diagnosis Date Noted  . Left bundle branch block 06/25/2018  . Second degree AV block, Mobitz type I 06/25/2018  . Adrenal adenoma 05/09/2017  . Pulmonary nodules 04/19/2017  . Medication monitoring encounter 04/19/2017  . BPH (benign prostatic hyperplasia) 04/19/2017  . Pain in wrist 01/20/2017  . Elevated serum creatinine 10/04/2016  . 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 CAD (coronary artery disease) 03/25/2016  . Abnormal EKG 12/16/2015  . Hyperlipidemia 07/07/2015  . Essential hypertension 07/07/2015  . Hyperglycemia 07/07/2015  . Obesity 07/07/2015    Past Medical History:  Diagnosis Date  . 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 BPH (benign prostatic hyperplasia)   . Hyperlipidemia   . Hypertension   .  Irregular heartbeat    been taking Atenolol for 20 years    Past Surgical History:  Procedure Laterality Date  . COLONOSCOPY  2008  . TONSILLECTOMY AND ADENOIDECTOMY  1955    Social History   Tobacco Use  . Smoking status: Former Smoker    Last attempt to quit: 06/22/1992    Years since quitting: 26.1  . Smokeless tobacco: Former Systems developer    Quit date: 06/22/1992  Substance Use Topics  . Alcohol use: Yes    Alcohol/week: 0.0 standard drinks    Comment: occasional     Current Outpatient Medications:  .  aspirin EC 81 MG tablet, Take 1 tablet (81 mg total) by mouth daily., Disp: , Rfl:  .  atenolol (TENORMIN) 25 MG tablet, Take 1 tablet (25 mg total) by mouth daily., Disp: 90 tablet, Rfl: 0 .  meloxicam (MOBIC) 15 MG tablet, Take 1 tablet (15 mg total) by mouth daily as needed for pain. Caution: long-term may cause decrease in kidney function, Disp: 90 tablet, Rfl: 0 .  Multiple Vitamin (MULTIVITAMIN) tablet, Take 1 tablet by mouth daily., Disp: , Rfl:  .  ondansetron (ZOFRAN ODT) 4 MG disintegrating tablet, Take 1 tablet (4 mg total) by mouth every 8 (eight) hours as needed for nausea or vomiting., Disp: 25 tablet, Rfl: 0 .  rosuvastatin (CRESTOR) 40 MG tablet, Take 0.5 tablets (20 mg total) by mouth daily., Disp: , Rfl:  .  Saw [place],  Serenoa repens, (SAW PALMETTO PO), Take by mouth daily., Disp: , Rfl:  .  methylPREDNISolone (MEDROL DOSEPAK) 4 MG TBPK tablet, methylprednisolone 4 mg tablets in a dose pack, Disp: , Rfl:   No Known Allergies  ROS   No other specific complaints in a complete review of systems (except as listed in HPI above).  Objective  Vitals:   08/10/18 1332  BP: 122/70  Pulse: 74  Temp: 98.5 F (36.9 C)  TempSrc: Oral  SpO2: 95%  Weight: 206 lb 3.2 oz (93.5 kg)  Height: 5\' 10"  (1.778 m)    Body mass index is 29.59 kg/m.  Nursing Note and Vital Signs reviewed.  Physical Exam  Constitutional: He is oriented to person, place, and time. He  appears well-developed and well-nourished. No distress.  HENT:  Head: Normocephalic and atraumatic.  Eyes: Pupils are equal, round, and reactive to light. EOM are normal.  Cardiovascular: Normal rate, regular rhythm and intact distal pulses.  Pulmonary/Chest: Effort normal and breath sounds normal.  Abdominal: Soft. Bowel sounds are normal. He exhibits no distension and no mass. There is no tenderness. There is no rebound and no guarding.  Neurological: He is alert and oriented to person, place, and time. No sensory deficit. Coordination (positve romberg) abnormal.  Nursing note and vitals reviewed.    No results found for this or any previous visit (from the past 48 hour(s)).  Assessment & Plan  1. Dizziness  2. Positive Romberg test  3. Non-intractable vomiting with nausea, unspecified vomiting type  4. Decreased GFR  Referred to ER after discussion with Dr. Ancil Boozer, due to unimproved symptoms, positive romberg and abdominal pain after eating with nausea, vomiting and diarrhea considered mesenteric ischemia, h.pylori, GI infection, additional considerations cardiac vs vertigo vs dehydration.   - Follow up and care instructions discussed and provided in AVS.

## 2018-08-10 NOTE — ED Triage Notes (Signed)
Sent by pcp for possible dehydreation. Has been sick for 2 weeks with vomitng.  No diarrhea.

## 2018-08-10 NOTE — Patient Instructions (Addendum)
Please go to the ER to be further evaluated for your dizziness, vomiting, and for fluid replacement. While you may just be having symptoms of a viral gastrointestinal illness and dehydration due to you unimproving symptoms and cardiac history we would like your to be further evaluate to ensure nothing else is going on.

## 2018-08-10 NOTE — Telephone Encounter (Signed)
Please request patient to come in to be further evaluated, as I am concerned he has not been improving.

## 2018-08-10 NOTE — Telephone Encounter (Signed)
Copied from [place] [id number]. Topic: Quick Communication - See Telephone Encounter >> Aug 10, 2018 10:38 AM Percell Belt A wrote: CRM for notification. See Telephone encounter for: 08/10/18.  Pt called in and stated he is no any better.  He still feels the same seen he was seen this week.  He would like to speak with Poulose CMA to ask her what else should be he doing or what does he need to do?    Best  number- [phone]

## 2018-08-10 NOTE — ED Triage Notes (Signed)
C/O Nausea and diarrhea x 6 days.  Seen through Urgent Care initially, then followed up with PCP on Monday.  Urgent care had started patient on antibiotic, but stopped after seeing PCP.  Patient has also had vomiting, able to tolerate PO today, but continues to c/o nausea.

## 2018-08-13 ENCOUNTER — Other Ambulatory Visit: Payer: Self-pay

## 2018-08-13 ENCOUNTER — Encounter: Payer: Self-pay | Admitting: Emergency Medicine

## 2018-08-13 ENCOUNTER — Ambulatory Visit
Admission: RE | Admit: 2018-08-13 | Discharge: 2018-08-13 | Disposition: A | Discharge status: Home / Self Care | Payer: PPO | Source: Ambulatory Visit | Attending: Nurse Practitioner | Admitting: Nurse Practitioner

## 2018-08-13 ENCOUNTER — Encounter: Payer: Self-pay | Admitting: Nurse Practitioner

## 2018-08-13 ENCOUNTER — Ambulatory Visit: Payer: PPO | Admitting: Physical Therapy

## 2018-08-13 ENCOUNTER — Ambulatory Visit (INDEPENDENT_AMBULATORY_CARE_PROVIDER_SITE_OTHER): Payer: PPO | Admitting: Nurse Practitioner

## 2018-08-13 ENCOUNTER — Ambulatory Visit
Admission: EM | Admit: 2018-08-13 | Discharge: 2018-08-13 | Disposition: A | Discharge status: Home / Self Care | Payer: PPO | Attending: Internal Medicine | Admitting: Internal Medicine

## 2018-08-13 VITALS — BP 110/60 | HR 75 | Temp 97.4°F | Resp 16 | Ht 70.0 in | Wt 210.7 lb

## 2018-08-13 DIAGNOSIS — R5383 Other fatigue: Secondary | ICD-10-CM | POA: Diagnosis not present

## 2018-08-13 DIAGNOSIS — E860 Dehydration: Secondary | ICD-10-CM

## 2018-08-13 DIAGNOSIS — H9313 Tinnitus, bilateral: Secondary | ICD-10-CM

## 2018-08-13 DIAGNOSIS — R824 Acetonuria: Secondary | ICD-10-CM

## 2018-08-13 DIAGNOSIS — R42 Dizziness and giddiness: Secondary | ICD-10-CM

## 2018-08-13 DIAGNOSIS — R319 Hematuria, unspecified: Secondary | ICD-10-CM | POA: Diagnosis not present

## 2018-08-13 DIAGNOSIS — R630 Anorexia: Secondary | ICD-10-CM | POA: Diagnosis not present

## 2018-08-13 DIAGNOSIS — R110 Nausea: Secondary | ICD-10-CM | POA: Diagnosis not present

## 2018-08-13 MED ORDER — ONDANSETRON 4 MG PO TBDP: 4.0000 mg | ORAL_TABLET | Freq: Three times a day (TID) | ORAL | 0 refills | Status: DC | PRN

## 2018-08-13 MED ORDER — SODIUM CHLORIDE 0.9 % IV BOLUS
1000.0000 mL | Freq: Once | INTRAVENOUS | Status: AC
  Administered 2018-08-13: 1000 mL via INTRAVENOUS

## 2018-08-13 MED ORDER — ATENOLOL 25 MG PO TABS: 12.5000 mg | ORAL_TABLET | Freq: Every day | ORAL | 0 refills | Status: DC

## 2018-08-13 NOTE — Patient Instructions (Addendum)
-   Stop atenolol for 3 days.  - Continue drinking plenty water ( goal of 8 glasses a day; can switch to Gatorade if Pedialyte makes you nauseous).   Here some foods you can eat on a bland diet:   Milk and other dairy products, low-fat only   Cooked, canned, or frozen vegetables   Fruit and vegetable juices   Cooked or canned fruit with the skin and seeds removed, such as applesauce or canned peaches   Breads, crackers and pasta made with refined white flour   Refined hot cereals, such as oatmeal and cream of wheat   Lean, tender meats, such as poultry, whitefish, and shellfish that are steamed, baked, or grilled with no added fat   Creamy peanut butter   Pudding and custard   Eggs   Tofu   Soup, especially broth   Weak tea  Foods to Avoid  Here are some foods you should NOT eat when you are on a bland diet:   Fatty dairy foods, such as whipped cream or high-fat ice cream   Strong cheeses, such as bleu or Roquefort   Raw vegetables   Vegetables that make you gassy, such as broccoli, cabbage, cauliflower, cucumber, green peppers and corn   Fresh berries and other fresh fruit   Dried fruit   Whole-grain or bran cereals   Whole-grain breads, crackers, or pasta   Pickles, sauerkraut, and similar foods   Spices, such as hot pepper and garlic   Foods with a lot of sugar or honey in them   Seeds and nuts   Highly seasoned cured or smoked meats and fish   Fried foods

## 2018-08-13 NOTE — ED Triage Notes (Signed)
Patient c/o nausea and dizziness that started over the past week.  Patient states that he was seen a week ago at an urgent care and then followed up with his PCP on 08/10/18 and was sent to the ED.  Patient states that he saw his PCP today and had more blood work done today and also had a CT head scan done.

## 2018-08-13 NOTE — Discharge Instructions (Signed)
Please push fluids.  2 bags of iv fluid given at urgent care this evening with improvement in well being.  Recheck or followup with Dr Delight Ovens office if well being does not continue to improve.

## 2018-08-13 NOTE — Progress Notes (Addendum)
Name: Stephen Stein   MRN: [id number]    DOB: May 16, 1941   Date:08/13/2018       Progress Note  Subjective  Chief Complaint  Chief Complaint  Patient presents with  . Nausea    Continues to have Nausea. He needs refill on his Zofran  . Emesis    He has not vomitted since Saturday night  . Diarrhea    His diarrhea has resolved  . Dehydration    Drinking adult pedia lite. No appetite. Lethargic  . Sleeping Problem    Sleeping makes him feel better. His wife states he is sleeping 18-20 hours a day.    HPI  Patient presents again to clinic for unimproved symptoms of nausea, dizziness, poor appetite and lethargy. Last episode of vomiting was Saturday night and diarrhea has resolved. Was sent to ER on Friday due to worsening symptoms accompanied with dizziness. Negative CBC, lipase, mildly improving kidney function, UA showed trace blood and ketones and no infection.   Does note that zofran helped when he took it routinely took last it at 3:30 am. Notes that dizziness is no longer with position changes but is constant. He notes baseline tinnitus has worsened and is feeling increasingly lethargic. Denies headache, or visual changes.  Patient Active Problem List   Diagnosis Date Noted  . Left bundle branch block 06/25/2018  . Second degree AV block, Mobitz type I 06/25/2018  . Adrenal adenoma 05/09/2017  . Pulmonary nodules 04/19/2017  . Medication monitoring encounter 04/19/2017  . BPH (benign prostatic hyperplasia) 04/19/2017  . Pain in wrist 01/20/2017  . Elevated serum creatinine 10/04/2016  . 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 CAD (coronary artery disease) 03/25/2016  . Abnormal EKG 12/16/2015  . Hyperlipidemia 07/07/2015  . Essential hypertension 07/07/2015  . Hyperglycemia 07/07/2015  . Obesity 07/07/2015    Past Medical History:  Diagnosis Date  . 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 BPH (benign prostatic hyperplasia)   .  Hyperlipidemia   . Hypertension   . Irregular heartbeat    been taking Atenolol for 20 years    Past Surgical History:  Procedure Laterality Date  . COLONOSCOPY  2008  . TONSILLECTOMY AND ADENOIDECTOMY  1955    Social History   Tobacco Use  . Smoking status: Former Smoker    Last attempt to quit: 06/22/1992    Years since quitting: 26.1  . Smokeless tobacco: Former Systems developer    Quit date: 06/22/1992  Substance Use Topics  . Alcohol use: Yes    Alcohol/week: 0.0 standard drinks    Comment: occasional     Current Outpatient Medications:  .  aspirin EC 81 MG tablet, Take 1 tablet (81 mg total) by mouth daily., Disp: , Rfl:  .  atenolol (TENORMIN) 25 MG tablet, Take 1 tablet (25 mg total) by mouth daily., Disp: 90 tablet, Rfl: 0 .  meloxicam (MOBIC) 15 MG tablet, Take 1 tablet (15 mg total) by mouth daily as needed for pain. Caution: long-term may cause decrease in kidney function, Disp: 90 tablet, Rfl: 0 .  Multiple Vitamin (MULTIVITAMIN) tablet, Take 1 tablet by mouth daily., Disp: , Rfl:  .  ondansetron (ZOFRAN ODT) 4 MG disintegrating tablet, Take 1 tablet (4 mg total) by mouth every 8 (eight) hours as needed for nausea or vomiting., Disp: 25 tablet, Rfl: 0 .  rosuvastatin (CRESTOR) 20 MG tablet, Take 20 mg by mouth daily as needed. , Disp: ,  Rfl:  .  Saw Palmetto, Serenoa repens, (SAW PALMETTO PO), Take 1 tablet by mouth daily. , Disp: , Rfl:   No Known Allergies  Review of Systems  Constitutional: Positive for malaise/fatigue. Negative for chills and fever.  HENT: Negative for congestion, ear pain and sore throat.        Hoarse voice   Eyes: Negative for blurred vision and double vision.  Respiratory: Negative for cough and shortness of breath.   Cardiovascular: Negative for chest pain and palpitations.  Gastrointestinal: Positive for nausea. Negative for abdominal pain, blood in stool, constipation, diarrhea, heartburn and vomiting.       More hiccups   Genitourinary:  Positive for dysuria (slight burning; states is normal for him). Negative for flank pain, frequency and hematuria (not that he noticed, but did note in UA at ER).  Musculoskeletal: Negative for falls, joint pain and myalgias.  Skin: Negative for rash.  Neurological: Positive for dizziness. Negative for sensory change, focal weakness and headaches.  Endo/Heme/Allergies: Negative for polydipsia.  Psychiatric/Behavioral: The patient is not nervous/anxious and does not have insomnia.     No other specific complaints in a complete review of systems (except as listed in HPI above).  Objective  Vitals:   08/13/18 0948 08/13/18 0949  BP:  110/60  Pulse:  75  Resp:  16  Temp:  (!) 97.4 F (36.3 C)  TempSrc:  Oral  SpO2:  98%  Weight:  210 lb 11.2 oz (95.6 kg)  Height: 5\' 10"  (1.778 m) 5\' 10"  (1.778 m)    Body mass index is 30.23 kg/m.  Nursing Note and Vital Signs reviewed.  Physical Exam  Constitutional: He is oriented to person, place, and time. He appears well-developed and well-nourished. No distress.  HENT:  Head: Normocephalic and atraumatic.  Right Ear: Tympanic membrane, external ear and ear canal normal.  Left Ear: Tympanic membrane, external ear and ear canal normal.  Nose: Nose normal.  Mouth/Throat: No oropharyngeal exudate.  Dry mouth  Eyes: Pupils are equal, round, and reactive to light. Conjunctivae and EOM are normal.  Cardiovascular: Normal rate and intact distal pulses.  Pulmonary/Chest: Effort normal and breath sounds normal.  Abdominal: Soft. He exhibits no distension and no mass. There is no tenderness.  Musculoskeletal: Normal range of motion.  Neurological: He is alert and oriented to person, place, and time. No sensory deficit. Coordination (postive romberg) abnormal.  Skin: Skin is warm and dry. Capillary refill takes 2 to 3 seconds. He is not diaphoretic. No erythema.  Psychiatric: He has a normal mood and affect. His behavior is normal. Judgment and  thought content normal.      No results found for this or any previous visit (from the past 48 hour(s)).  Assessment & Plan 1. Dizziness - CT Head Wo Contrast; Future - BASIC METABOLIC PANEL WITH GFR - EKG 12-Lead: No acute changes; NSR with LBBB   2. Lethargy - CT Head Wo Contrast; Future  3. Tinnitus of both ears - CT Head Wo Contrast; Future  4. Hematuria, unspecified type - Urinalysis, Routine w reflex microscopic  5. Urine ketones - Urinalysis, Routine w reflex microscopic  6. Nausea - ondansetron (ZOFRAN ODT) 4 MG disintegrating tablet; Take 1 tablet (4 mg total) by mouth every 8 (eight) hours as needed for nausea or vomiting.  Dispense: 30 tablet; Refill: 0  7. Decreased appetite Discussed bland, soft diet.   Patient presents to clinic after over a week and a half of GI complaints including  decreased appetite, nausea, vomiting, diarrhea.  Patient is now having normal bowel movements, appears to have stopped vomiting, but nausea is still persistent.  Took last Zofran this morning, presently denies nausea.  He does note increasing lethargy, states has no energy to do anything wife notes that he has been sleeping for about 18 hours a day.  Patient states dizziness is now no longer just when standing but is relatively constant.  Denies falls.  Patient was sent to ER on Friday for this labs revealed improving kidney function, urinalysis showed blood and ketones in urine we will recheck this today.  Discussed case with Dr. Sanda Klein who recommended CT scan of head due to dizziness increasing tinnitus and positive Romberg.  Will stop 12.5 mg dose of beta-blocker for the next 2 days.  Discussed urgent care as patient can get IV hydration.  Refill Zofran, discussed plan for hydration.  Patient refusing to return to ER but notified of ER precautions.  We will follow-up in 2 days or sooner if needed.  -Red flags and when to present for emergency care or RTC including fever, palpitations,  inability to keep anything down, extreme lethargy,  new/worsening/un-resolving symptoms, reviewed with patient at time of visit. Follow up and care instructions discussed and provided in AVS.  ---------------------------------- I have reviewed this encounter including the documentation in this note and/or discussed this patient with the provider, Suezanne Cheshire DNP AGNP-C. I am certifying that I agree with the content of this note as supervising physician. Enid Derry, [facility] 08/16/2018, 12:39 PM

## 2018-08-13 NOTE — ED Provider Notes (Signed)
[facility]    CSN: [id number] Arrival date & time: 08/13/18  1413     History   Chief Complaint Chief Complaint  Patient presents with  . Dizziness    HPI Stephen Stein is a 77 y.o. male.   He presents today 10 days after an illness characterized by vomiting and diarrhea; diarrhea has resolved and last emesis was 2 days ago.  He was seen in the emergency room and thought to be dehydrated on 8/16.  He had a recheck with his PCP earlier today, and has persistent ketones in his urine.  Lab work from that visit is pending.  EKG was unchanged from previous, redemonstrated and LBBB.  Head CT was negative. Patient continues to have lightheadedness on changing position.  He is requesting IV fluids.   HPI  Past Medical History:  Diagnosis Date  . 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 BPH (benign prostatic hyperplasia)   . Hyperlipidemia   . Hypertension   . Irregular heartbeat    been taking Atenolol for 20 years    Patient Active Problem List   Diagnosis Date Noted  . Left bundle branch block 06/25/2018  . Second degree AV block, Mobitz type I 06/25/2018  . Adrenal adenoma 05/09/2017  . Pulmonary nodules 04/19/2017  . Medication monitoring encounter 04/19/2017  . BPH (benign prostatic hyperplasia) 04/19/2017  . Pain in wrist 01/20/2017  . Elevated serum creatinine 10/04/2016  . 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 CAD (coronary artery disease) 03/25/2016  . Abnormal EKG 12/16/2015  . Hyperlipidemia 07/07/2015  . Essential hypertension 07/07/2015  . Hyperglycemia 07/07/2015  . Obesity 07/07/2015    Past Surgical History:  Procedure Laterality Date  . COLONOSCOPY  2008  . TONSILLECTOMY AND ADENOIDECTOMY  1955       Home Medications    Prior to Admission medications   Medication Sig Start Date End Date Taking? Authorizing Provider  aspirin EC 81 MG tablet Take 1 tablet (81 mg total) by mouth daily. 10/04/16   Yes Lada, Satira Anis, MD  atenolol (TENORMIN) 25 MG tablet Take 0.5 tablets (12.5 mg total) by mouth daily. 08/13/18  Yes Poulose, Bethel Born, NP  Multiple Vitamin (MULTIVITAMIN) tablet Take 1 tablet by mouth daily.   Yes [provider]  rosuvastatin (CRESTOR) 20 MG tablet Take 20 mg by mouth daily as needed.  10/19/17  Yes Lada, Satira Anis, MD  Saw Palmetto, Serenoa repens, (SAW PALMETTO PO) Take 1 tablet by mouth daily.    Yes [provider]  esomeprazole (NEXIUM) 40 MG capsule Take by mouth. 08/16/18   [provider]  meloxicam (MOBIC) 15 MG tablet Take 1 tablet (15 mg total) by mouth daily as needed for pain. Caution: long-term may cause decrease in kidney function 07/20/18   Lada, Satira Anis, MD  metoCLOPramide (REGLAN) 5 MG tablet Take 1 tablet (5 mg total) by mouth 3 (three) times daily before meals. 08/15/18   Poulose, Bethel Born, NP  prochlorperazine (COMPAZINE) 10 MG tablet Take by mouth. 08/16/18   [provider]    Family History Family History  Problem Relation Age of Onset  . Cancer Mother        lung  . Heart disease Father   . Hearing loss Sister        coclear implant  . Hearing loss Brother   . Cancer Paternal Grandmother   . Cancer Son  colon cancer  . Colon cancer Neg Hx   . Stomach cancer Neg Hx     Social History Social History   Tobacco Use  . Smoking status: Former Smoker    Last attempt to quit: 06/22/1992    Years since quitting: 26.1  . Smokeless tobacco: Former Systems developer    Quit date: 06/22/1992  Substance Use Topics  . Alcohol use: Yes    Alcohol/week: 0.0 standard drinks    Comment: occasional  . Drug use: No     Allergies   Patient has no known allergies.   Review of Systems Review of Systems  All other systems reviewed and are negative.    Physical Exam Triage Vital Signs ED Triage Vitals  Enc Vitals Group     BP 08/13/18 1436 124/68     Pulse Rate 08/13/18 1436 60     Resp 08/13/18 1436 16      Temp 08/13/18 1436 97.8 F (36.6 C)     Temp Source 08/13/18 1436 Oral     SpO2 08/13/18 1436 100 %     Weight 08/13/18 1439 208 lb (94.3 kg)     Height 08/13/18 1439 5\' 10"  (1.778 m)     Pain Score 08/13/18 1437 0     Pain Loc --    Updated Vital Signs BP 124/68 (BP Location: Left Arm)   Pulse 60   Temp 97.8 F (36.6 C) (Oral)   Resp 16   Ht 5\' 10"  (1.778 m)   Wt 94.3 kg   SpO2 100%   BMI 29.84 kg/m  Physical Exam  Constitutional: He is oriented to person, place, and time. No distress.  Alert, nicely groomed  HENT:  Head: Atraumatic.  Eyes:  Conjugate gaze, no eye redness/drainage  Neck: Neck supple.  Cardiovascular: Normal rate and regular rhythm.  Pulmonary/Chest: No respiratory distress. He has no wheezes. He has no rales.  Lungs clear, symmetric breath sounds  Abdominal: Soft. He exhibits no distension. There is no tenderness. There is no rebound and no guarding.  Musculoskeletal: Normal range of motion.  Neurological: He is alert and oriented to person, place, and time.  Skin: Skin is warm and dry.  No cyanosis  Nursing note and vitals reviewed.    Procedures Procedures (including critical care time)  Medications Ordered in UC Medications  sodium chloride 0.9 % bolus 1,000 mL (0 mLs Intravenous Stopped 08/13/18 1703)  sodium chloride 0.9 % bolus 1,000 mL (0 mLs Intravenous Stopped 08/13/18 1820)  patient with marked improvement in well being after 2L ivf; wants to go home   Final Clinical Impressions(s) / UC Diagnoses   Final diagnoses:  Dehydration     Discharge Instructions     Please push fluids.  2 bags of iv fluid given at urgent care this evening with improvement in well being.  Recheck or followup with Dr Delight Ovens office if well being does not continue to improve.        Wynona Luna, MD 08/17/18 2118

## 2018-08-14 ENCOUNTER — Telehealth: Payer: Self-pay | Admitting: Nurse Practitioner

## 2018-08-14 LAB — URINALYSIS, ROUTINE W REFLEX MICROSCOPIC
BILIRUBIN URINE: NEGATIVE
GLUCOSE, UA: NEGATIVE
HGB URINE DIPSTICK: NEGATIVE
Leukocytes, UA: NEGATIVE
Nitrite: NEGATIVE
PROTEIN: NEGATIVE
Specific Gravity, Urine: 1.03 (ref 1.001–1.03)
pH: 5.5 (ref 5.0–8.0)

## 2018-08-14 LAB — BASIC METABOLIC PANEL WITH GFR
BUN: 22 mg/dL (ref 7–25)
CHLORIDE: 98 mmol/L (ref 98–110)
CO2: 28 mmol/L (ref 20–32)
Calcium: 9.7 mg/dL (ref 8.6–10.3)
Creat: 1.07 mg/dL (ref 0.70–1.18)
GFR, EST AFRICAN AMERICAN: 78 mL/min/{1.73_m2} (ref >=60)
GFR, EST NON AFRICAN AMERICAN: 67 mL/min/{1.73_m2} (ref >=60)
Glucose, Bld: 101 mg/dL — ABNORMAL HIGH (ref 65–99)
POTASSIUM: 4.2 mmol/L (ref 3.5–5.3)
SODIUM: 135 mmol/L (ref 135–146)

## 2018-08-14 NOTE — Telephone Encounter (Signed)
Competed in my chart instead of telephone encounter

## 2018-08-15 ENCOUNTER — Encounter: Payer: Self-pay | Admitting: Nurse Practitioner

## 2018-08-15 ENCOUNTER — Ambulatory Visit (INDEPENDENT_AMBULATORY_CARE_PROVIDER_SITE_OTHER): Payer: PPO | Admitting: Nurse Practitioner

## 2018-08-15 ENCOUNTER — Other Ambulatory Visit: Payer: Self-pay | Admitting: Family Medicine

## 2018-08-15 VITALS — BP 120/60 | HR 80 | Temp 97.6°F | Resp 16 | Ht 70.0 in | Wt 208.8 lb

## 2018-08-15 DIAGNOSIS — F329 Major depressive disorder, single episode, unspecified: Secondary | ICD-10-CM | POA: Diagnosis not present

## 2018-08-15 DIAGNOSIS — R197 Diarrhea, unspecified: Secondary | ICD-10-CM | POA: Diagnosis not present

## 2018-08-15 DIAGNOSIS — R5383 Other fatigue: Secondary | ICD-10-CM

## 2018-08-15 DIAGNOSIS — R4589 Other symptoms and signs involving emotional state: Secondary | ICD-10-CM

## 2018-08-15 DIAGNOSIS — W57XXXA Bitten or stung by nonvenomous insect and other nonvenomous arthropods, initial encounter: Secondary | ICD-10-CM

## 2018-08-15 DIAGNOSIS — R112 Nausea with vomiting, unspecified: Secondary | ICD-10-CM

## 2018-08-15 MED ORDER — FAMOTIDINE 20 MG PO TABS: 20.0000 mg | ORAL_TABLET | Freq: Two times a day (BID) | ORAL | 0 refills | Status: DC

## 2018-08-15 MED ORDER — METOCLOPRAMIDE HCL 5 MG PO TABS: 5.0000 mg | ORAL_TABLET | Freq: Three times a day (TID) | ORAL | 1 refills | Status: DC

## 2018-08-15 NOTE — Patient Instructions (Signed)
-   Keep pushing fluids - Take new nausea medicine every 8 hours - Take pepcid (famotidine) twice a day - abdominal x-ray is ordered for you to go get at any point - Referral sent to GI, labs

## 2018-08-15 NOTE — Progress Notes (Addendum)
Name: Stephen Stein   MRN: [id number]    DOB: [birth year]   Date:08/15/2018       Progress Note  Subjective  Chief Complaint  Chief Complaint  Patient presents with  . Diarrhea  . Emesis  . Nausea  . Anorexia  . Depression    HPI Patient presents for ongoing illness starting with GI symptoms- nausea, vomiting, diarrhea, which had all resolved for a few days except for nausea. illness progressing to fatigue, orthostatic dizziness- sent to ER Friday with improving labs. Seen Monday with kidney function back to baseline- patient received 2L of IV fluids at urgent care. States vomiting and diarrhea have restarted Monday night. States decreased appetite, lots of burping, sipping water, states has sweet taste in mouth- associated it with nausea medicine. States dizziness and fatigue still present and same.   Wt Readings from Last 3 Encounters:  08/15/18 208 lb 12.8 oz (94.7 kg)  08/13/18 208 lb (94.3 kg)  08/13/18 210 lb 11.2 oz (95.6 kg)  08/07/18 214 lbs  Notes left armpit has foul odor. No redness warmth or tenderness. States had ticks on him in the past, none in the last 1-2 months.   Patient Active Problem List   Diagnosis Date Noted  . Left bundle branch block 06/25/2018  . Second degree AV block, Mobitz type I 06/25/2018  . Adrenal adenoma 05/09/2017  . Pulmonary nodules 04/19/2017  . Medication monitoring encounter 04/19/2017  . BPH (benign prostatic hyperplasia) 04/19/2017  . Pain in wrist 01/20/2017  . Elevated serum creatinine 10/04/2016  . 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 CAD (coronary artery disease) 03/25/2016  . Abnormal EKG 12/16/2015  . Hyperlipidemia 07/07/2015  . Essential hypertension 07/07/2015  . Hyperglycemia 07/07/2015  . Obesity 07/07/2015    Past Medical History:  Diagnosis Date  . 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 BPH (benign prostatic hyperplasia)   . Hyperlipidemia   . Hypertension   . Irregular  heartbeat    been taking Atenolol for 20 years    Past Surgical History:  Procedure Laterality Date  . COLONOSCOPY  2008  . TONSILLECTOMY AND ADENOIDECTOMY  1955    Social History   Tobacco Use  . Smoking status: Former Smoker    Last attempt to quit: 06/22/1992    Years since quitting: 26.1  . Smokeless tobacco: Former Systems developer    Quit date: 06/22/1992  Substance Use Topics  . Alcohol use: Yes    Alcohol/week: 0.0 standard drinks    Comment: occasional     Current Outpatient Medications:  .  aspirin EC 81 MG tablet, Take 1 tablet (81 mg total) by mouth daily., Disp: , Rfl:  .  atenolol (TENORMIN) 25 MG tablet, Take 0.5 tablets (12.5 mg total) by mouth daily., Disp: 90 tablet, Rfl: 0 .  meloxicam (MOBIC) 15 MG tablet, Take 1 tablet (15 mg total) by mouth daily as needed for pain. Caution: long-term may cause decrease in kidney function, Disp: 90 tablet, Rfl: 0 .  Multiple Vitamin (MULTIVITAMIN) tablet, Take 1 tablet by mouth daily., Disp: , Rfl:  .  ondansetron (ZOFRAN ODT) 4 MG disintegrating tablet, Take 1 tablet (4 mg total) by mouth every 8 (eight) hours as needed for nausea or vomiting., Disp: 30 tablet, Rfl: 0 .  rosuvastatin (CRESTOR) 20 MG tablet, Take 20 mg by mouth daily as needed. , Disp: , Rfl:  .  Saw Palmetto, Serenoa repens, (SAW PALMETTO PO),  Take 1 tablet by mouth daily. , Disp: , Rfl:   No Known Allergies  Review of Systems  Constitutional: Positive for chills, malaise/fatigue and weight loss. Negative for diaphoresis and fever.  HENT: Positive for tinnitus. Negative for sore throat. Ear pain: worsening ringing in ears.   Eyes: Negative for blurred vision and double vision.  Respiratory: Negative for shortness of breath.   Cardiovascular: Negative for chest pain and palpitations.  Gastrointestinal: Positive for diarrhea, nausea and vomiting. Negative for abdominal pain, blood in stool and heartburn.  Genitourinary: Positive for dysuria. Negative for flank pain,  frequency and hematuria.  Musculoskeletal: Negative for back pain, joint pain, myalgias and neck pain.  Skin: Negative for itching and rash.  Neurological: Positive for dizziness and weakness. Negative for focal weakness and headaches.  Psychiatric/Behavioral: Negative for depression (states feels discouraged not depressed) and memory loss. The patient is not nervous/anxious and does not have insomnia.     No other specific complaints in a complete review of systems (except as listed in HPI above).  Objective  Vitals:   08/15/18 0846  BP: 120/60  Pulse: 80  Resp: 16  Temp: 97.6 F (36.4 C)  TempSrc: Oral  SpO2: 98%  Weight: 208 lb 12.8 oz (94.7 kg)  Height: 5\' 10"  (1.778 m)     Body mass index is 29.96 kg/m.  Nursing Note and Vital Signs reviewed.  Physical Exam  Constitutional: He is oriented to person, place, and time. He appears well-developed and well-nourished. No distress.  Cardiovascular: Normal rate, regular rhythm, normal heart sounds and intact distal pulses.  Pulmonary/Chest: Effort normal and breath sounds normal.  Abdominal: Soft. He exhibits no mass. There is no tenderness. There is no guarding.  Hypoactive bowel sounds   Musculoskeletal: Normal range of motion. He exhibits no edema, tenderness or deformity.  Neurological: He is alert and oriented to person, place, and time.  Skin: Skin is warm and dry.  Psychiatric: He has a normal mood and affect. His behavior is normal. Judgment and thought content normal.      Results for orders placed or performed in visit on 08/13/18 (from the past 48 hour(s))  BASIC METABOLIC PANEL WITH GFR     Status: Abnormal   Collection Time: 08/13/18 11:15 AM  Result Value Ref Range   Glucose, Bld 101 (H) 65 - 99 mg/dL    Comment: .            Fasting reference interval . For someone without known diabetes, a glucose value between 100 and 125 mg/dL is consistent with prediabetes and should be confirmed with a follow-up  test. .    BUN 22 7 - 25 mg/dL   Creat 1.07 0.70 - 1.18 mg/dL    Comment: For patients >53 years of age, the reference limit for Creatinine is approximately 13% higher for people identified as African-American. .    GFR, Est Non African American 67 > OR = 60 mL/min/1.29m2   GFR, Est African American 78 > OR = 60 mL/min/1.95m2   BUN/Creatinine Ratio NOT APPLICABLE 6 - 22 (calc)   Sodium 135 135 - 146 mmol/L   Potassium 4.2 3.5 - 5.3 mmol/L   Chloride 98 98 - 110 mmol/L   CO2 28 20 - 32 mmol/L   Calcium 9.7 8.6 - 10.3 mg/dL  Urinalysis, Routine w reflex microscopic     Status: Abnormal   Collection Time: 08/13/18 11:15 AM  Result Value Ref Range   Color, Urine DARK YELLOW YELLOW  APPearance CLEAR CLEAR   Specific Gravity, Urine 1.030 1.001 - 1.03   pH 5.5 5.0 - 8.0   Glucose, UA NEGATIVE NEGATIVE   Bilirubin Urine NEGATIVE NEGATIVE   Ketones, ur 1+ (A) NEGATIVE   Hgb urine dipstick NEGATIVE NEGATIVE   Protein, ur NEGATIVE NEGATIVE   Nitrite NEGATIVE NEGATIVE   Leukocytes, UA NEGATIVE NEGATIVE    Assessment & Plan  1. Non-intractable vomiting with nausea, unspecified vomiting type - Ambulatory referral to Gastroenterology - metoCLOPramide (REGLAN) 5 MG tablet; Take 1 tablet (5 mg total) by mouth 3 (three) times daily before meals.  Dispense: 15 tablet; Refill: 1 - DG Abd 2 Views; Future - famotidine (PEPCID) 20 MG tablet; Take 1 tablet (20 mg total) by mouth 2 (two) times daily.  Dispense: 60 tablet; Refill: 0  2. Diarrhea, unspecified type - Ambulatory referral to Gastroenterology - Stool Culture - Gastrointestinal Pathogen Panel PCR - DG Abd 2 Views; Future  3. Fatigue, unspecified type - Ehrlichia Antibody Panel - BASIC METABOLIC PANEL WITH GFR - Vitamin D (25 hydroxy) - TSH  4. Tick bite, initial encounter - Ehrlichia Antibody Panel  5. Depressed mood - BASIC METABOLIC PANEL WITH GFR - Vitamin D (25 hydroxy) - TSH   Patient presents again for follow-up  of illness that is been ongoing for 2 weeks now.  Patient has had days where he has felt he was improving, followed by days of return of symptoms.  Presently patient has return symptoms of nausea, vomiting, diarrhea, fatigue, decreased appetite and additionally has had significant weight loss and depressed mood likely due to lack of oral intake and illness.  Will send to GI for further work-up along with checking thyroid, vitamin D levels and GI panel now that diarrhea has returned.  Patient states he feels that Zofran is not really helping, will switch patient to Reglan discussed side effects including tardive dyskinesia.  Discussed returning to clear liquid diet and advanced to soft diet after 24 hours of no vomiting.  Reinforced ER precautions and when appropriate to go to urgent care instead.  We will follow-up with patient on Friday per patient request.   -Red flags and when to present for emergency care or RTC including fever >101.78F,weakness, abdominal pain, new/worsening/un-resolving symptoms,  reviewed with patient at time of visit. Follow up and care instructions discussed and provided in AVS.  ------------------------------------ I have reviewed this encounter including the documentation in this note and/or discussed this patient with the provider, Suezanne Cheshire DNP AGNP-C. I am certifying that I agree with the content of this note as supervising physician. Enid Derry, [facility] 08/16/2018, 5:03 PM

## 2018-08-16 ENCOUNTER — Ambulatory Visit: Payer: PPO | Admitting: Physical Therapy

## 2018-08-16 DIAGNOSIS — R5383 Other fatigue: Secondary | ICD-10-CM | POA: Diagnosis not present

## 2018-08-16 DIAGNOSIS — R42 Dizziness and giddiness: Secondary | ICD-10-CM | POA: Diagnosis not present

## 2018-08-16 DIAGNOSIS — R112 Nausea with vomiting, unspecified: Secondary | ICD-10-CM | POA: Diagnosis not present

## 2018-08-16 DIAGNOSIS — R197 Diarrhea, unspecified: Secondary | ICD-10-CM | POA: Diagnosis not present

## 2018-08-17 ENCOUNTER — Encounter: Payer: Self-pay | Admitting: Nurse Practitioner

## 2018-08-17 ENCOUNTER — Ambulatory Visit: Payer: PPO | Admitting: Nurse Practitioner

## 2018-08-17 VITALS — BP 120/80 | HR 77 | Temp 97.6°F | Resp 16 | Ht 70.0 in | Wt 206.7 lb

## 2018-08-17 DIAGNOSIS — R112 Nausea with vomiting, unspecified: Secondary | ICD-10-CM

## 2018-08-17 DIAGNOSIS — R42 Dizziness and giddiness: Secondary | ICD-10-CM

## 2018-08-17 DIAGNOSIS — R5383 Other fatigue: Secondary | ICD-10-CM

## 2018-08-17 LAB — EHRLICHIA ANTIBODY PANEL: E. CHAFFEENSIS AB IGG: <1:64 {titer}

## 2018-08-17 LAB — BASIC METABOLIC PANEL WITH GFR
BUN/Creatinine Ratio: 13 (calc) (ref 6–22)
BUN: 16 mg/dL (ref 7–25)
CALCIUM: 10.1 mg/dL (ref 8.6–10.3)
CHLORIDE: 100 mmol/L (ref 98–110)
CO2: 25 mmol/L (ref 20–32)
Creat: 1.2 mg/dL — ABNORMAL HIGH (ref 0.70–1.18)
GFR, EST AFRICAN AMERICAN: 68 mL/min/{1.73_m2} (ref >=60)
GFR, Est Non African American: 58 mL/min/{1.73_m2} — ABNORMAL LOW (ref >=60)
Glucose, Bld: 99 mg/dL (ref 65–99)
Potassium: 4.1 mmol/L (ref 3.5–5.3)
Sodium: 138 mmol/L (ref 135–146)

## 2018-08-17 LAB — TSH: TSH: 2.13 mIU/L (ref 0.40–4.50)

## 2018-08-17 LAB — VITAMIN D 25 HYDROXY (VIT D DEFICIENCY, FRACTURES): Vit D, 25-Hydroxy: 36 ng/mL (ref 30–100)

## 2018-08-17 NOTE — Progress Notes (Signed)
Name: Stephen Stein   MRN: [id number]    DOB: Sep 27, 1941   Date:08/17/2018       Progress Note  Subjective  Chief Complaint  Chief Complaint  Patient presents with  . Dizziness  . Nausea  . Emesis  . Diarrhea    HPI  Saw GI yesterday- Mason Croley PA-C Switch from reglan and H2blocker to compazine and PPI and plan for upper GI series in 2 weeks. States still having nausea- unchanged- having dry heaves. Yesterday evening had piece of toast and popscile and a little bit of apples sauce. Still no pain, dizziness persists.   Negative H.pylori  Patient Active Problem List   Diagnosis Date Noted  . Left bundle branch block 06/25/2018  . Second degree AV block, Mobitz type I 06/25/2018  . Adrenal adenoma 05/09/2017  . Pulmonary nodules 04/19/2017  . Medication monitoring encounter 04/19/2017  . BPH (benign prostatic hyperplasia) 04/19/2017  . Pain in wrist 01/20/2017  . Elevated serum creatinine 10/04/2016  . 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 CAD (coronary artery disease) 03/25/2016  . Abnormal EKG 12/16/2015  . Hyperlipidemia 07/07/2015  . Essential hypertension 07/07/2015  . Hyperglycemia 07/07/2015  . Obesity 07/07/2015    Past Medical History:  Diagnosis Date  . 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 BPH (benign prostatic hyperplasia)   . Hyperlipidemia   . Hypertension   . Irregular heartbeat    been taking Atenolol for 20 years    Past Surgical History:  Procedure Laterality Date  . COLONOSCOPY  2008  . TONSILLECTOMY AND ADENOIDECTOMY  1955    Social History   Tobacco Use  . Smoking status: Former Smoker    Last attempt to quit: 06/22/1992    Years since quitting: 26.1  . Smokeless tobacco: Former Systems developer    Quit date: 06/22/1992  Substance Use Topics  . Alcohol use: Yes    Alcohol/week: 0.0 standard drinks    Comment: occasional     Current Outpatient Medications:  .  aspirin EC 81 MG tablet, Take 1 tablet  (81 mg total) by mouth daily., Disp: , Rfl:  .  atenolol (TENORMIN) 25 MG tablet, Take 0.5 tablets (12.5 mg total) by mouth daily., Disp: 90 tablet, Rfl: 0 .  esomeprazole (NEXIUM) 40 MG capsule, Take by mouth., Disp: , Rfl:  .  meloxicam (MOBIC) 15 MG tablet, Take 1 tablet (15 mg total) by mouth daily as needed for pain. Caution: long-term may cause decrease in kidney function, Disp: 90 tablet, Rfl: 0 .  metoCLOPramide (REGLAN) 5 MG tablet, Take 1 tablet (5 mg total) by mouth 3 (three) times daily before meals., Disp: 15 tablet, Rfl: 1 .  Multiple Vitamin (MULTIVITAMIN) tablet, Take 1 tablet by mouth daily., Disp: , Rfl:  .  prochlorperazine (COMPAZINE) 10 MG tablet, Take by mouth., Disp: , Rfl:  .  rosuvastatin (CRESTOR) 20 MG tablet, Take 20 mg by mouth daily as needed. , Disp: , Rfl:  .  Saw Palmetto, Serenoa repens, (SAW PALMETTO PO), Take 1 tablet by mouth daily. , Disp: , Rfl:   No Known Allergies  ROS Positive: nausea, fatigue, dizziness Negative: abdominal pain, fevers, chills  No other specific complaints in a complete review of systems (except as listed in HPI above).  Objective  Vitals:   08/17/18 0845  BP: 120/80  Pulse: 77  Resp: 16  Temp: 97.6 F (36.4 C)  TempSrc: Oral  SpO2: 98%  Weight: 206 lb 11.2 oz (93.8 kg)  Height: 5\' 10"  (1.778 m)    Body mass index is 29.66 kg/m.  Nursing Note and Vital Signs reviewed.  Physical Exam  Constitutional: He is oriented to person, place, and time. He appears well-developed and well-nourished.  HENT:  Mouth/Throat: Oropharynx is clear and moist. No oropharyngeal exudate.  Eyes: Conjunctivae are normal.  Cardiovascular: Normal rate and regular rhythm.  Pulmonary/Chest: Effort normal and breath sounds normal.  Abdominal: Soft. There is no tenderness.  Hypoactive bowel sounds throughout  Neurological: He is alert and oriented to person, place, and time.  Skin: Skin is warm and dry. There is pallor.  Psychiatric: He has  a normal mood and affect. His behavior is normal. Judgment and thought content normal.    Assessment & Plan 1. Non-intractable vomiting with nausea, unspecified vomiting type Continue medication regimen; ER precautions reinforced and times to go to Urgent Care for IV hydration. Clear liquids and slowly transition to soft diet. Discussed importance of hydration.   2. Fatigue, unspecified type Discussed importance of hydration.   3. Dizziness Discussed importance of hydration.   - Patient requests follow up next week.  -Red flags and when to present for emergency care or RTC including fever >101.45F, lethargy,  new/worsening/un-resolving symptoms, reviewed with patient at time of visit. Follow up and care instructions discussed and provided in AVS.

## 2018-08-18 ENCOUNTER — Ambulatory Visit (INDEPENDENT_AMBULATORY_CARE_PROVIDER_SITE_OTHER)
Admission: EM | Admit: 2018-08-18 | Discharge: 2018-08-18 | Disposition: A | Discharge status: Other Acute Inpatient Hospital | Payer: PPO | Source: Home / Self Care | Attending: Family Medicine | Admitting: Family Medicine

## 2018-08-18 ENCOUNTER — Observation Stay
Admission: AD | Admit: 2018-08-18 | Discharge: 2018-08-20 | Disposition: A | Discharge status: Home / Self Care | Payer: PPO | Source: Ambulatory Visit | Attending: Internal Medicine | Admitting: Internal Medicine

## 2018-08-18 ENCOUNTER — Observation Stay: Payer: PPO

## 2018-08-18 ENCOUNTER — Other Ambulatory Visit: Payer: Self-pay

## 2018-08-18 ENCOUNTER — Encounter: Payer: Self-pay | Admitting: Emergency Medicine

## 2018-08-18 DIAGNOSIS — Z6829 Body mass index (BMI) 29.0-29.9, adult: Secondary | ICD-10-CM | POA: Diagnosis not present

## 2018-08-18 DIAGNOSIS — R1110 Vomiting, unspecified: Secondary | ICD-10-CM | POA: Diagnosis not present

## 2018-08-18 DIAGNOSIS — B3781 Candidal esophagitis: Secondary | ICD-10-CM | POA: Diagnosis not present

## 2018-08-18 DIAGNOSIS — Z79899 Other long term (current) drug therapy: Secondary | ICD-10-CM | POA: Diagnosis not present

## 2018-08-18 DIAGNOSIS — I499 Cardiac arrhythmia, unspecified: Secondary | ICD-10-CM | POA: Diagnosis not present

## 2018-08-18 DIAGNOSIS — K2980 Duodenitis without bleeding: Secondary | ICD-10-CM

## 2018-08-18 DIAGNOSIS — R112 Nausea with vomiting, unspecified: Secondary | ICD-10-CM | POA: Diagnosis present

## 2018-08-18 DIAGNOSIS — Z87891 Personal history of nicotine dependence: Secondary | ICD-10-CM | POA: Diagnosis not present

## 2018-08-18 DIAGNOSIS — I10 Essential (primary) hypertension: Secondary | ICD-10-CM | POA: Insufficient documentation

## 2018-08-18 DIAGNOSIS — E785 Hyperlipidemia, unspecified: Secondary | ICD-10-CM | POA: Diagnosis not present

## 2018-08-18 DIAGNOSIS — K2970 Gastritis, unspecified, without bleeding: Secondary | ICD-10-CM

## 2018-08-18 DIAGNOSIS — I951 Orthostatic hypotension: Secondary | ICD-10-CM | POA: Diagnosis not present

## 2018-08-18 DIAGNOSIS — E860 Dehydration: Secondary | ICD-10-CM | POA: Diagnosis not present

## 2018-08-18 DIAGNOSIS — R110 Nausea: Secondary | ICD-10-CM | POA: Diagnosis present

## 2018-08-18 DIAGNOSIS — E43 Unspecified severe protein-calorie malnutrition: Secondary | ICD-10-CM | POA: Diagnosis not present

## 2018-08-18 DIAGNOSIS — I2510 Atherosclerotic heart disease of native coronary artery without angina pectoris: Secondary | ICD-10-CM | POA: Insufficient documentation

## 2018-08-18 DIAGNOSIS — Z7982 Long term (current) use of aspirin: Secondary | ICD-10-CM | POA: Insufficient documentation

## 2018-08-18 DIAGNOSIS — R197 Diarrhea, unspecified: Secondary | ICD-10-CM | POA: Diagnosis not present

## 2018-08-18 LAB — COMPREHENSIVE METABOLIC PANEL
ALT: 28 U/L (ref 0–44)
ANION GAP: 11 (ref 5–15)
AST: 24 U/L (ref 15–41)
Albumin: 4.1 g/dL (ref 3.5–5.0)
Alkaline Phosphatase: 23 U/L — ABNORMAL LOW (ref 38–126)
BILIRUBIN TOTAL: 0.8 mg/dL (ref 0.3–1.2)
BUN: 15 mg/dL (ref 8–23)
CO2: 22 mmol/L (ref 22–32)
Calcium: 9.5 mg/dL (ref 8.9–10.3)
Chloride: 104 mmol/L (ref 98–111)
Creatinine, Ser: 1 mg/dL (ref 0.61–1.24)
GFR calc Af Amer: >60 mL/min (ref >60)
GFR calc non Af Amer: >60 mL/min (ref >60)
Glucose, Bld: 113 mg/dL — ABNORMAL HIGH (ref 70–99)
Potassium: 3.6 mmol/L (ref 3.5–5.1)
SODIUM: 137 mmol/L (ref 135–145)
Total Protein: 7.2 g/dL (ref 6.5–8.1)

## 2018-08-18 LAB — CBC
HEMATOCRIT: 44.7 % (ref 40.0–52.0)
Hemoglobin: 16.1 g/dL (ref 13.0–18.0)
MCH: 33.1 pg (ref 26.0–34.0)
MCHC: 36.1 g/dL — ABNORMAL HIGH (ref 32.0–36.0)
MCV: 91.7 fL (ref 80.0–100.0)
PLATELETS: 208 10*3/uL (ref 150–440)
RBC: 4.88 MIL/uL (ref 4.40–5.90)
RDW: 13.3 % (ref 11.5–14.5)
WBC: 9.3 10*3/uL (ref 3.8–10.6)

## 2018-08-18 LAB — LIPASE, BLOOD: Lipase: 30 U/L (ref 11–51)

## 2018-08-18 LAB — TSH: TSH: 1.812 u[IU]/mL (ref 0.350–4.500)

## 2018-08-18 MED ORDER — ROSUVASTATIN CALCIUM 10 MG PO TABS
20.0000 mg | ORAL_TABLET | Freq: Every day | ORAL | Status: DC
  Administered 2018-08-18: 20 mg via ORAL
  Filled 2018-08-18 (×3): qty 2

## 2018-08-18 MED ORDER — ATENOLOL 25 MG PO TABS
12.5000 mg | ORAL_TABLET | Freq: Every day | ORAL | Status: DC
  Filled 2018-08-18 (×3): qty 0.5

## 2018-08-18 MED ORDER — FAMOTIDINE IN NACL 20-0.9 MG/50ML-% IV SOLN
20.0000 mg | Freq: Two times a day (BID) | INTRAVENOUS | Status: DC
  Administered 2018-08-18 – 2018-08-20 (×5): 20 mg via INTRAVENOUS
  Filled 2018-08-18 (×4): qty 50

## 2018-08-18 MED ORDER — SODIUM CHLORIDE 0.9 % IV SOLN
INTRAVENOUS | Status: DC
  Administered 2018-08-18 – 2018-08-20 (×5): via INTRAVENOUS

## 2018-08-18 MED ORDER — ONDANSETRON HCL 4 MG PO TABS: 4.0000 mg | ORAL_TABLET | Freq: Four times a day (QID) | ORAL | Status: DC | PRN

## 2018-08-18 MED ORDER — IOPAMIDOL (ISOVUE-300) INJECTION 61%
15.0000 mL | INTRAVENOUS | Status: AC
  Administered 2018-08-18 (×2): 15 mL via ORAL

## 2018-08-18 MED ORDER — ASPIRIN EC 81 MG PO TBEC: 81.0000 mg | DELAYED_RELEASE_TABLET | Freq: Every day | ORAL | Status: DC

## 2018-08-18 MED ORDER — IOPAMIDOL (ISOVUE-300) INJECTION 61%
100.0000 mL | Freq: Once | INTRAVENOUS | Status: AC | PRN
  Administered 2018-08-18: 100 mL via INTRAVENOUS

## 2018-08-18 MED ORDER — ACETAMINOPHEN 650 MG RE SUPP: 650.0000 mg | Freq: Four times a day (QID) | RECTAL | Status: DC | PRN

## 2018-08-18 MED ORDER — ENOXAPARIN SODIUM 40 MG/0.4ML ~~LOC~~ SOLN
40.0000 mg | SUBCUTANEOUS | Status: DC
  Filled 2018-08-18 (×2): qty 0.4

## 2018-08-18 MED ORDER — ONDANSETRON HCL 4 MG/2ML IJ SOLN
4.0000 mg | Freq: Four times a day (QID) | INTRAMUSCULAR | Status: DC | PRN
  Administered 2018-08-19 (×2): 4 mg via INTRAVENOUS
  Filled 2018-08-18 (×3): qty 2

## 2018-08-18 MED ORDER — ACETAMINOPHEN 325 MG PO TABS: 650.0000 mg | ORAL_TABLET | Freq: Four times a day (QID) | ORAL | Status: DC | PRN

## 2018-08-18 NOTE — H&P (Addendum)
[facility] NAME: Stephen Stein    MR#:  [id number]  DATE OF BIRTH:  1941-03-30  DATE OF ADMISSION:  08/18/2018  PRIMARY CARE PHYSICIAN: Arnetha Courser, MD   REQUESTING/REFERRING PHYSICIAN: Thersa Salt DO  CHIEF COMPLAINT:  No chief complaint on file.   HISTORY OF PRESENT ILLNESS: Stephen Stein  is a 77 y.o. male with a known history of BPH, hyperlipidemia, irregular heartbeat Complains of nausea vomiting.  Patient states that he has been having these symptoms ongoing for the past few weeks.  He has been to the urgent care as well as his primary care provider and has been symptomatically treated.  He states that he is lost about 5 pounds.  He denies any abdominal pain.  He does report some occasional diarrhea.  He states that the only time he is not nauseous or throwing up is when he is sleeping.  He denies any fevers or chills.    PAST MEDICAL HISTORY:   Past Medical History:  Diagnosis Date  . 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 BPH (benign prostatic hyperplasia)   . Hyperlipidemia   . Hypertension    For irregular heart beat  . Irregular heartbeat    been taking Atenolol for 20 years    PAST SURGICAL HISTORY:  Past Surgical History:  Procedure Laterality Date  . COLONOSCOPY  2008  . TONSILLECTOMY AND ADENOIDECTOMY  1955    SOCIAL HISTORY:  Social History   Tobacco Use  . Smoking status: Former Smoker    Last attempt to quit: 06/22/1992    Years since quitting: 26.1  . Smokeless tobacco: Former Systems developer    Quit date: 06/22/1992  Substance Use Topics  . Alcohol use: Yes    Alcohol/week: 1.0 standard drinks    Types: 1 Cans of beer per week    Comment: occasional    FAMILY HISTORY:  Family History  Problem Relation Age of Onset  . Cancer Mother        lung  . Heart disease Father   . Hearing loss Sister        coclear implant  . Hearing loss Brother   . Cancer Paternal Grandmother    . Cancer Son        colon cancer  . Colon cancer Neg Hx   . Stomach cancer Neg Hx     DRUG ALLERGIES: No Known Allergies  REVIEW OF SYSTEMS:   CONSTITUTIONAL: No fever, fatigue or weakness.  EYES: No blurred or double vision.  EARS, NOSE, AND THROAT: No tinnitus or ear pain.  RESPIRATORY: No cough, shortness of breath, wheezing or hemoptysis.  CARDIOVASCULAR: No chest pain, orthopnea, edema.  GASTROINTESTINAL: Positive nausea, positive vomiting, positive diarrhea or abdominal pain.  GENITOURINARY: No dysuria, hematuria.  ENDOCRINE: No polyuria, nocturia,  HEMATOLOGY: No anemia, easy bruising or bleeding SKIN: No rash or lesion. MUSCULOSKELETAL: No joint pain or arthritis.   NEUROLOGIC: No tingling, numbness, weakness.  PSYCHIATRY: No anxiety or depression.   MEDICATIONS AT HOME:  Prior to Admission medications   Medication Sig Start Date End Date Taking? Authorizing Provider  aspirin EC 81 MG tablet Take 1 tablet (81 mg total) by mouth daily. 10/04/16  Yes Lada, Satira Anis, MD  atenolol (TENORMIN) 25 MG tablet Take 0.5 tablets (12.5 mg total) by mouth daily. 08/13/18  Yes Poulose, Bethel Born, NP  esomeprazole (NEXIUM) 40 MG capsule Take by  mouth. 08/16/18  Yes [provider]  meloxicam (MOBIC) 15 MG tablet Take 1 tablet (15 mg total) by mouth daily as needed for pain. Caution: long-term may cause decrease in kidney function 07/20/18  Yes Lada, Satira Anis, MD  metoCLOPramide (REGLAN) 5 MG tablet Take 1 tablet (5 mg total) by mouth 3 (three) times daily before meals. 08/15/18  Yes Poulose, Bethel Born, NP  Multiple Vitamin (MULTIVITAMIN) tablet Take 1 tablet by mouth daily.   Yes [provider]  prochlorperazine (COMPAZINE) 10 MG tablet Take by mouth. 08/16/18  Yes [provider]  rosuvastatin (CRESTOR) 20 MG tablet Take 20 mg by mouth daily as needed.  10/19/17  Yes Lada, Satira Anis, MD  Saw Palmetto, Serenoa repens, (SAW PALMETTO PO) Take 1 tablet by mouth  daily.    Yes [provider]      PHYSICAL EXAMINATION:   VITAL SIGNS: Blood pressure 130/77, pulse 63, resp. rate 16, SpO2 99 %.  GENERAL:  77 y.o.-year-old patient lying in the bed with no acute distress.  EYES: Pupils equal, round, reactive to light and accommodation. No scleral icterus. Extraocular muscles intact.  HEENT: Head atraumatic, normocephalic. Oropharynx and nasopharynx clear.  NECK:  Supple, no jugular venous distention. No thyroid enlargement, no tenderness.  LUNGS: Normal breath sounds bilaterally, no wheezing, rales,rhonchi or crepitation. No use of accessory muscles of respiration.  CARDIOVASCULAR: S1, S2 normal. No murmurs, rubs, or gallops.  ABDOMEN: Soft, nontender, nondistended. Bowel sounds present. No organomegaly or mass.  EXTREMITIES: No pedal edema, cyanosis, or clubbing.  NEUROLOGIC: Cranial nerves II through XII are intact. Muscle strength 5/5 in all extremities. Sensation intact. Gait not checked.  PSYCHIATRIC: The patient is alert and oriented x 3.  SKIN: No obvious rash, lesion, or ulcer.   LABORATORY PANEL:   CBC No results for input(s): WBC, HGB, HCT, PLT, MCV, MCH, MCHC, RDW, LYMPHSABS, MONOABS, EOSABS, BASOSABS, BANDABS in the last 168 hours.  Invalid input(s): NEUTRABS, BANDSABD ------------------------------------------------------------------------------------------------------------------  Chemistries  Recent Labs  Lab 08/13/18 1115 08/15/18 0936  NA 135 138  K 4.2 4.1  CL 98 100  CO2 28 25  GLUCOSE 101* 99  BUN 22 16  CREATININE 1.07 1.20*  CALCIUM 9.7 10.1   ------------------------------------------------------------------------------------------------------------------ estimated creatinine clearance is 60 mL/min (A) (by C-G formula based on SCr of 1.2 mg/dL (H)). ------------------------------------------------------------------------------------------------------------------ No results for input(s): TSH, T4TOTAL,  T3FREE, THYROIDAB in the last 72 hours.  Invalid input(s): FREET3   Coagulation profile No results for input(s): INR, PROTIME in the last 168 hours. ------------------------------------------------------------------------------------------------------------------- No results for input(s): DDIMER in the last 72 hours. -------------------------------------------------------------------------------------------------------------------  Cardiac Enzymes No results for input(s): CKMB, TROPONINI, MYOGLOBIN in the last 168 hours.  Invalid input(s): CK ------------------------------------------------------------------------------------------------------------------ Invalid input(s): POCBNP  ---------------------------------------------------------------------------------------------------------------  Urinalysis    Component Value Date/Time   COLORURINE DARK YELLOW 08/13/2018 1115   APPEARANCEUR CLEAR 08/13/2018 1115   LABSPEC 1.030 08/13/2018 1115   PHURINE 5.5 08/13/2018 1115   GLUCOSEU NEGATIVE 08/13/2018 1115   HGBUR NEGATIVE 08/13/2018 1115   BILIRUBINUR NEGATIVE 08/10/2018 1504   KETONESUR 1+ (A) 08/13/2018 1115   PROTEINUR NEGATIVE 08/13/2018 1115   NITRITE NEGATIVE 08/13/2018 1115   LEUKOCYTESUR NEGATIVE 08/13/2018 1115     RADIOLOGY: No results found.  EKG: Orders placed or performed in visit on 08/13/18  . EKG 12-Lead    IMPRESSION AND PLAN: Patient is a 77 year old presenting with intractable nausea vomiting  1.  Intractable nausea vomiting etiology unclear I will obtain a CT of  the abdomen pelvis to further evaluate Give IV fluids I will place him on prnZofran GI consult Check liver function test Patient may need EGD  2. Irregular heart rate continue atenolol  3.  Hyperlipidemia continue home Crestor  4.  Miscellaneous Lovenox for DVT prophylaxis     All the records are reviewed and case discussed with ED provider. Management plans discussed with  the patient, family and they are in agreement.  CODE STATUS: Advance Directive Documentation     Most Recent Value  Type of Advance Directive  Living will, Healthcare Power of Attorney  Pre-existing out of facility DNR order (yellow form or pink MOST form)  -  "MOST" Form in Place?  -       TOTAL TIME TAKING CARE OF THIS PATIENT:36minutes.    Dustin Flock M.D on 08/18/2018 at 1:47 PM  Between 7am to 6pm - Pager - [phone]  After 6pm go to [email or web address] - password [email or web address] [facility]  [phone]  CC: Primary care physician; Arnetha Courser, MD

## 2018-08-18 NOTE — Care Management Obs Status (Signed)
[facility] NOTIFICATION   Patient Details  Name: Stephen Stein MRN: [id number] Date of Birth: [birth year]   Medicare Observation Status Notification Given:  Yes    Mart Colpitts A Paislyn Domenico, RN 08/18/2018, 2:57 PM

## 2018-08-18 NOTE — ED Provider Notes (Addendum)
[facility]    CSN: [id number] Arrival date & time: 08/18/18  1035  History   Chief Complaint Chief Complaint  Patient presents with  . Nausea  . Dizziness   HPI  77 year old male presents with intractable nausea and associated intermittent vomiting.  This has been ongoing for 2 weeks.  He has seen his primary, urgent care, ER, and GI.  He continues to be persistently nauseated.  Decreased appetite.  Not eating and drinking much at all.  He has had orthostasis.  He is been given IV fluids with brief improvement but no resolution.  Continues to be symptomatic with severe nausea.  Reports lightheadedness and dizziness.  He has been placed on Protonix, Compazine, Zofran, Reglan, H2 blocker without significant improvement.  Per GI, patient needs endoscopy but this has not been arranged yet.  Patient presents today for additional evaluation as he continues to feel poorly.  Patient is unsure of what to do.  He has not responded to antiemetics.  Continues to be nauseated with belching and burping.  Feels dehydrated.  Past Medical History:  Diagnosis Date  . 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 BPH (benign prostatic hyperplasia)   . Hyperlipidemia   . Hypertension   . Irregular heartbeat    been taking Atenolol for 20 years    Patient Active Problem List   Diagnosis Date Noted  . Left bundle branch block 06/25/2018  . Second degree AV block, Mobitz type I 06/25/2018  . Adrenal adenoma 05/09/2017  . Pulmonary nodules 04/19/2017  . Medication monitoring encounter 04/19/2017  . BPH (benign prostatic hyperplasia) 04/19/2017  . Pain in wrist 01/20/2017  . Elevated serum creatinine 10/04/2016  . 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 CAD (coronary artery disease) 03/25/2016  . Abnormal EKG 12/16/2015  . Hyperlipidemia 07/07/2015  . Essential hypertension 07/07/2015  . Hyperglycemia 07/07/2015  . Obesity 07/07/2015    Past  Surgical History:  Procedure Laterality Date  . COLONOSCOPY  2008  . TONSILLECTOMY AND ADENOIDECTOMY  1955    Home Medications    Prior to Admission medications   Medication Sig Start Date End Date Taking? Authorizing Provider  aspirin EC 81 MG tablet Take 1 tablet (81 mg total) by mouth daily. 10/04/16  Yes Lada, Satira Anis, MD  atenolol (TENORMIN) 25 MG tablet Take 0.5 tablets (12.5 mg total) by mouth daily. 08/13/18  Yes Poulose, Bethel Born, NP  esomeprazole (NEXIUM) 40 MG capsule Take by mouth. 08/16/18  Yes [provider]  meloxicam (MOBIC) 15 MG tablet Take 1 tablet (15 mg total) by mouth daily as needed for pain. Caution: long-term may cause decrease in kidney function 07/20/18  Yes Lada, Satira Anis, MD  metoCLOPramide (REGLAN) 5 MG tablet Take 1 tablet (5 mg total) by mouth 3 (three) times daily before meals. 08/15/18  Yes Poulose, Bethel Born, NP  Multiple Vitamin (MULTIVITAMIN) tablet Take 1 tablet by mouth daily.   Yes [provider]  prochlorperazine (COMPAZINE) 10 MG tablet Take by mouth. 08/16/18  Yes [provider]  rosuvastatin (CRESTOR) 20 MG tablet Take 20 mg by mouth daily as needed.  10/19/17  Yes Lada, Satira Anis, MD  Saw Palmetto, Serenoa repens, (SAW PALMETTO PO) Take 1 tablet by mouth daily.    Yes [provider]    Family History Family History  Problem Relation Age of Onset  . Cancer Mother        lung  .  Heart disease Father   . Hearing loss Sister        coclear implant  . Hearing loss Brother   . Cancer Paternal Grandmother   . Cancer Son        colon cancer  . Colon cancer Neg Hx   . Stomach cancer Neg Hx     Social History Social History   Tobacco Use  . Smoking status: Former Smoker    Last attempt to quit: 06/22/1992    Years since quitting: 26.1  . Smokeless tobacco: Former Systems developer    Quit date: 06/22/1992  Substance Use Topics  . Alcohol use: Yes    Alcohol/week: 0.0 standard drinks    Comment: occasional    . Drug use: No     Allergies   Patient has no known allergies.   Review of Systems Review of Systems  Gastrointestinal: Positive for nausea and vomiting. Negative for abdominal pain, constipation and diarrhea.  Neurological: Positive for dizziness and light-headedness.   Physical Exam Triage Vital Signs ED Triage Vitals  Enc Vitals Group     BP 08/18/18 1051 125/75     Pulse Rate 08/18/18 1051 78     Resp 08/18/18 1051 16     Temp 08/18/18 1051 (!) 97.5 F (36.4 C)     Temp Source 08/18/18 1051 Oral     SpO2 08/18/18 1051 100 %     Weight 08/18/18 1050 205 lb (93 kg)     Height 08/18/18 1050 5\' 10"  (1.778 m)     Head Circumference --      Peak Flow --      Pain Score 08/18/18 1050 0     Pain Loc --      Pain Edu? --      Excl. in [place]? --    Updated Vital Signs BP 134/70 (BP Location: Left Arm)   Pulse 90   Temp (!) 97.5 F (36.4 C) (Oral)   Resp 16   Ht 5\' 10"  (1.778 m)   Wt 93 kg   SpO2 100%   BMI 29.41 kg/m   Visual Acuity Right Eye Distance:   Left Eye Distance:   Bilateral Distance:    Right Eye Near:   Left Eye Near:    Bilateral Near:     Physical Exam  Constitutional: He is oriented to person, place, and time. He appears well-developed. No distress.  Appears fatigued.  HENT:  Dry mucous membranes.  Cardiovascular: Normal rate and regular rhythm.  Pulmonary/Chest: Effort normal and breath sounds normal. He has no wheezes. He has no rales.  Abdominal: Soft. Bowel sounds are normal. He exhibits no distension. There is no tenderness.  Neurological: He is alert and oriented to person, place, and time.  Psychiatric: He has a normal mood and affect. His behavior is normal.  Nursing note and vitals reviewed.  UC Treatments / Results  Labs (all labs ordered are listed, but only abnormal results are displayed) Labs Reviewed - No data to display  EKG None  Radiology No results found.  Procedures Procedures (including critical care  time)  Medications Ordered in UC Medications - No data to display  Initial Impression / Assessment and Plan / UC Course  I have reviewed the triage vital signs and the nursing notes.  Pertinent labs & imaging results that were available during my care of the patient were reviewed by me and considered in my medical decision making (see chart for details).    76 year old male  presents with intractable nausea.  Patient clinically dehydrated.  After discussion with the on-call GI physician and with the patient I have arranged for him to be directly admitted for IV fluids, IV antiemetics, and GI consultation for endoscopy.  Final Clinical Impressions(s) / UC Diagnoses   Final diagnoses:  Non-intractable vomiting with nausea, unspecified vomiting type  Orthostasis  Dehydration     Discharge Instructions     Go to the ER (to register).  Room 224; Admitting is [facility] Pronto.  Take care  Dr. Lacinda Axon     ED Prescriptions    None     Controlled Substance Prescriptions [place] Controlled Substance Registry consulted? Not Applicable   >14 minutes were spent face-to-face with the patient during this encounter and over half of that time was spent on coordination of care.   Coral Spikes, DO 08/18/18 1309    Coral Spikes, DO 08/18/18 1310

## 2018-08-18 NOTE — Plan of Care (Signed)
DVT and PE prevention maintained. No falls during this shift. CT scan of the abdomen performed. The patient has been stable. No nausea or vomiting at this time.  Problem: Education: Goal: Knowledge of General Education information will improve Description Including pain rating scale, medication(s)/side effects and non-pharmacologic comfort measures Outcome: Progressing   Problem: Health Behavior/Discharge Planning: Goal: Ability to manage health-related needs will improve Outcome: Progressing   Problem: Clinical Measurements: Goal: Ability to maintain clinical measurements within normal limits will improve Outcome: Progressing Goal: Will remain free from infection Outcome: Progressing Goal: Diagnostic test results will improve Outcome: Progressing Goal: Respiratory complications will improve Outcome: Progressing Goal: Cardiovascular complication will be avoided Outcome: Progressing   Problem: Activity: Goal: Risk for activity intolerance will decrease Outcome: Progressing   Problem: Nutrition: Goal: Adequate nutrition will be maintained Outcome: Progressing   Problem: Coping: Goal: Level of anxiety will decrease Outcome: Progressing   Problem: Elimination: Goal: Will not experience complications related to bowel motility Outcome: Progressing Goal: Will not experience complications related to urinary retention Outcome: Progressing   Problem: Pain Managment: Goal: General experience of comfort will improve Outcome: Progressing   Problem: Safety: Goal: Ability to remain free from injury will improve Outcome: Progressing   Problem: Skin Integrity: Goal: Risk for impaired skin integrity will decrease Outcome: Progressing

## 2018-08-18 NOTE — Discharge Instructions (Signed)
Go to the ER (to register).  Room 224; Admitting is Posey Pronto.  Take care  Dr. Lacinda Axon

## 2018-08-18 NOTE — ED Triage Notes (Signed)
Patient in today c/o nausea and dizziness. Patient was seen here on 08/13/18 and given 2 liters of fluid. Patient states that he is still having symptoms. Patient saw GI on 08/16/18 and PCP on 08/17/18. Patient is waiting to be scheduled for an EGD.

## 2018-08-19 ENCOUNTER — Encounter: Payer: Self-pay | Admitting: Certified Registered"

## 2018-08-19 DIAGNOSIS — R112 Nausea with vomiting, unspecified: Secondary | ICD-10-CM | POA: Diagnosis not present

## 2018-08-19 DIAGNOSIS — B3781 Candidal esophagitis: Secondary | ICD-10-CM | POA: Diagnosis not present

## 2018-08-19 DIAGNOSIS — I499 Cardiac arrhythmia, unspecified: Secondary | ICD-10-CM | POA: Diagnosis not present

## 2018-08-19 DIAGNOSIS — E785 Hyperlipidemia, unspecified: Secondary | ICD-10-CM | POA: Diagnosis not present

## 2018-08-19 LAB — BASIC METABOLIC PANEL
Anion gap: 7 (ref 5–15)
BUN: 13 mg/dL (ref 8–23)
CHLORIDE: 107 mmol/L (ref 98–111)
CO2: 23 mmol/L (ref 22–32)
Calcium: 8.9 mg/dL (ref 8.9–10.3)
Creatinine, Ser: 0.9 mg/dL (ref 0.61–1.24)
GFR calc non Af Amer: >60 mL/min (ref >60)
Glucose, Bld: 112 mg/dL — ABNORMAL HIGH (ref 70–99)
POTASSIUM: 3.6 mmol/L (ref 3.5–5.1)
SODIUM: 137 mmol/L (ref 135–145)

## 2018-08-19 LAB — CBC
HEMATOCRIT: 41.6 % (ref 40.0–52.0)
HEMOGLOBIN: 14.8 g/dL (ref 13.0–18.0)
MCH: 32.8 pg (ref 26.0–34.0)
MCHC: 35.7 g/dL (ref 32.0–36.0)
MCV: 92 fL (ref 80.0–100.0)
Platelets: 190 10*3/uL (ref 150–440)
RBC: 4.52 MIL/uL (ref 4.40–5.90)
RDW: 13 % (ref 11.5–14.5)
WBC: 8 10*3/uL (ref 3.8–10.6)

## 2018-08-19 NOTE — Progress Notes (Signed)
Initial Nutrition Assessment  DOCUMENTATION CODES:   Severe malnutrition in context of acute illness/injury  INTERVENTION:  Provide Ensure Enlive po TID with diet advancement, each supplement provides 350 kcal and 20 grams of protein.   Provide daily MVI with diet advancement.  NUTRITION DIAGNOSIS:   Severe Malnutrition related to acute illness(intractable N/V) as evidenced by energy intake < or equal to 50% for > or equal to 5 days, 5.9% weight loss over 2 weeks, mild fat depletion, mild muscle depletion.  GOAL:   Patient will meet greater than or equal to 90% of their needs  MONITOR:   PO intake, Supplement acceptance, Diet advancement, Labs, Weight trends, I & O's  REASON FOR ASSESSMENT:   Malnutrition Screening Tool    ASSESSMENT:   77 year old male with PMHx of BPH, HLD, HTN who is admitted with intractable N/V.   -Plan is for EGD in AM.  Met with patient and his wife at bedside. Patient reports he typically has a very good appetite and intake. About 2 weeks ago he started experiencing N/V, dizziness, lightheadedness, anorexia (absence of hunger), and diarrhea. He has only been able to take in liquids such as popsicles, juice, and broth at home. He tried to eat toast one day and vomited afterwards. He may have 1-2 popsicles, a bowl of broth, and 1-2 cups of juice. Estimated intake of 390 kcal daily (19.6% minimum estimated kcal needs) over the past 2 weeks. He is currently still having nausea and reports dry heaving. He is unsure if he will be able to tolerate liquids. Patient is amenable to drinking ONS with diet advancement.  Patient reports his UBW is 220 lbs (100 kg). Per chart he was 101.6 kg on 06/19/2018, 97.4 kg on 08/07/2018, and is currently 91.7 kg. He has lost 5.7 kg (5.9% body weight) over the past 2 weeks, which is significant for time frame.  Medications reviewed and include: NS @ 100 mL/hr, famotidine.  Labs reviewed.  NUTRITION - FOCUSED PHYSICAL  EXAM:    Most Recent Value  Orbital Region  Moderate depletion  Upper Arm Region  Mild depletion  Thoracic and Lumbar Region  No depletion  Buccal Region  Mild depletion  Temple Region  Mild depletion  Clavicle Bone Region  Mild depletion  Clavicle and Acromion Bone Region  No depletion  Scapular Bone Region  No depletion  Dorsal Hand  No depletion  Patellar Region  No depletion  Anterior Thigh Region  No depletion  Posterior Calf Region  No depletion  Edema (RD Assessment)  None  Hair  Reviewed  Eyes  Reviewed  Mouth  Reviewed  Skin  Reviewed  Nails  Reviewed     Diet Order:   Diet Order            Diet clear liquid Room service appropriate? Yes; Fluid consistency: Thin  Diet effective now              EDUCATION NEEDS:   No education needs have been identified at this time  Skin:  Skin Assessment: Reviewed RN Assessment  Last BM:  08/18/2018  Height:   Ht Readings from Last 1 Encounters:  08/18/18 5' 10"  (1.778 m)    Weight:   Wt Readings from Last 1 Encounters:  08/18/18 91.7 kg    Ideal Body Weight:  75.5 kg  BMI:  Body mass index is 29.01 kg/m.  Estimated Nutritional Needs:   Kcal:  1990-2155 (MSJ x 1.2-1.3)  Protein:  100-110 grams (  1.1-1.2 grams/kg)  Fluid:  1.8-2 L/day  Willey Blade, MS, RD, LDN Office: [phone] Pager: [phone] After Hours/Weekend Pager: [phone]

## 2018-08-19 NOTE — Consult Note (Addendum)
Vonda Antigua, MD [address] [place], [place], [place], [address] [address], [place], [place], [place], [address] Phone: [phone]  Fax: [phone]  Consultation  Referring Provider:     Dr. Posey Pronto Primary Care Physician:  Arnetha Courser, MD Reason for Consultation:   Nausea and vomiting   Date of Admission:  08/18/2018 Date of Consultation:  08/19/2018         HPI:   Stephen Stein is a 77 y.o. male with 3-week history of nausea and vomiting.  Symptoms began with daily vomiting, 1-2 times a day, also had loose stools once a day and days when symptoms started.  Has been having formed bowel movements daily other than that.  No blood in stool.  Emesis consisted of clear liquid or food, and no hematemesis.  No prior EGDs.  No sick contacts or recent travel.  Eats outside the home on a frequent basis, but no other family members are sick who have eaten with him.  As he was distressed with his symptoms, and had repeated ER or urgent care visits since symptoms started, patient was admitted due to intractable nausea and vomiting.  No dysphagia.  Does have heartburn.  Has been given antinausea, and been started on outpatient Nexium, which did not help her symptoms.  However, over the last 3 days vomiting has completely resolved, and patient now just has nausea.  Reports decreased appetite during this time as well.  Last colonoscopy 2013, with 2 polyps removed.  Past Medical History:  Diagnosis Date  . 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 BPH (benign prostatic hyperplasia)   . Hyperlipidemia   . Hypertension    For irregular heart beat  . Irregular heartbeat    been taking Atenolol for 20 years    Past Surgical History:  Procedure Laterality Date  . COLONOSCOPY  2008  . TONSILLECTOMY AND ADENOIDECTOMY  1955    Prior to Admission medications   Medication Sig Start Date End Date Taking? Authorizing Provider  aspirin EC 81 MG tablet Take  1 tablet (81 mg total) by mouth daily. 10/04/16  Yes Lada, Satira Anis, MD  atenolol (TENORMIN) 25 MG tablet Take 0.5 tablets (12.5 mg total) by mouth daily. 08/13/18  Yes Poulose, Bethel Born, NP  esomeprazole (NEXIUM) 40 MG capsule Take by mouth. 08/16/18  Yes [provider]  meloxicam (MOBIC) 15 MG tablet Take 1 tablet (15 mg total) by mouth daily as needed for pain. Caution: long-term may cause decrease in kidney function 07/20/18  Yes Lada, Satira Anis, MD  metoCLOPramide (REGLAN) 5 MG tablet Take 1 tablet (5 mg total) by mouth 3 (three) times daily before meals. 08/15/18  Yes Poulose, Bethel Born, NP  Multiple Vitamin (MULTIVITAMIN) tablet Take 1 tablet by mouth daily.   Yes [provider]  prochlorperazine (COMPAZINE) 10 MG tablet Take by mouth. 08/16/18  Yes [provider]  rosuvastatin (CRESTOR) 20 MG tablet Take 20 mg by mouth daily as needed.  10/19/17  Yes Lada, Satira Anis, MD  Saw Palmetto, Serenoa repens, (SAW PALMETTO PO) Take 1 tablet by mouth daily.    Yes [provider]    Family History  Problem Relation Age of Onset  . Cancer Mother        lung  . Heart disease Father   . Hearing loss Sister        coclear implant  . Hearing loss Brother   .  Cancer Paternal Grandmother   . Cancer Son        colon cancer  . Colon cancer Neg Hx   . Stomach cancer Neg Hx      Social History   Tobacco Use  . Smoking status: Former Smoker    Last attempt to quit: 06/22/1992    Years since quitting: 26.1  . Smokeless tobacco: Former Systems developer    Quit date: 06/22/1992  Substance Use Topics  . Alcohol use: Yes    Alcohol/week: 1.0 standard drinks    Types: 1 Cans of beer per week    Comment: occasional  . Drug use: No    Allergies as of 08/18/2018  . (No Known Allergies)    Review of Systems:    All systems reviewed and negative except where noted in HPI.   Physical Exam:  Vital signs in last 24 hours: Vitals:   08/18/18 1416 08/18/18 2008  08/19/18 0442 08/19/18 1149  BP:  111/60 104/63 118/80  Pulse:  77 (!) 55 62  Resp:  18 18 16   Temp: 98.6 F (37 C) 97.7 F (36.5 C) (!) 97.5 F (36.4 C) 97.7 F (36.5 C)  TempSrc: Oral Oral Oral Oral  SpO2:  99% 98% 98%  Weight: 91.7 kg     Height: 5\' 10"  (1.778 m)      Last BM Date: 08/18/18 General:   Pleasant, cooperative in NAD Head:  Normocephalic and atraumatic. Eyes:   No icterus.   Conjunctiva pink. PERRLA. Ears:  Normal auditory acuity. Neck:  Supple; no masses or thyroidomegaly Lungs: Respirations even and unlabored. Lungs clear to auscultation bilaterally.   No wheezes, crackles, or rhonchi.  Abdomen:  Soft, nondistended, nontender. Normal bowel sounds. No appreciable masses or hepatomegaly.  No rebound or guarding.  Neurologic:  Alert and oriented x3;  grossly normal neurologically. Skin:  Intact without significant lesions or rashes. Cervical Nodes:  No significant cervical adenopathy. Psych:  Alert and cooperative. Normal affect.  LAB RESULTS: Recent Labs    08/18/18 1430 08/19/18 0625  WBC 9.3 8.0  HGB 16.1 14.8  HCT 44.7 41.6  PLT 208 190   BMET Recent Labs    08/18/18 1430 08/19/18 0625  NA 137 137  K 3.6 3.6  CL 104 107  CO2 22 23  GLUCOSE 113* 112*  BUN 15 13  CREATININE 1.00 0.90  CALCIUM 9.5 8.9   LFT Recent Labs    08/18/18 1430  PROT 7.2  ALBUMIN 4.1  AST 24  ALT 28  ALKPHOS 23*  BILITOT 0.8   PT/INR No results for input(s): LABPROT, INR in the last 72 hours.  STUDIES: Ct Abdomen Pelvis W Contrast  Result Date: 08/18/2018 CLINICAL DATA:  Nausea, vomiting, and diarrhea for 2 weeks. No pain. EXAM: CT ABDOMEN AND PELVIS WITH CONTRAST TECHNIQUE: Multidetector CT imaging of the abdomen and pelvis was performed using the standard protocol following bolus administration of intravenous contrast. CONTRAST:  170mL ISOVUE-300 IOPAMIDOL (ISOVUE-300) INJECTION 61% COMPARISON:  None. FINDINGS: Lower chest: No acute abnormality.  Hepatobiliary: Hepatic steatosis is noted. No liver masses are seen. The portal vein is patent. The gallbladder is normal. Pancreas: Unremarkable. No pancreatic ductal dilatation or surrounding inflammatory changes. Spleen: Normal in size without focal abnormality. Adrenals/Urinary Tract: A low-attenuation nodule in the left adrenal gland is stable since at least May of 2018 with an attenuation of 13 Hounsfield units and a maximum diameter of 14 mm. The right adrenal gland is normal. The kidneys, ureters, and  bladder are normal. Stomach/Bowel: The stomach and small bowel are normal. The colon and appendix are normal. Vascular/Lymphatic: Mild atherosclerotic changes are seen in the nonaneurysmal aorta. No adenopathy. Reproductive: Prostate is unremarkable. Other: There is a fat containing periumbilical hernia. No free air or free fluid. No other acute abnormalities. Musculoskeletal: No acute or significant osseous findings. IMPRESSION: 1. No cause for the patient's symptoms identified. 2. Hepatic steatosis. 3. The low-attenuation lesion in the left adrenal gland measuring 13 mm, unchanged since May of 2018, is nonspecific but probably an adenoma. 4. Mild atherosclerotic change in the nonaneurysmal aorta. Electronically Signed   By: Dorise Bullion III M.D   On: 08/18/2018 17:52      Impression / Plan:   Stephen Stein is a 9 y.o. y/o male with intractable nausea and vomiting for 3 weeks, with now some improvement in symptoms over the last 3 days, with only nausea but no further emesis for the last 3 days  Lab work reassuring with normal CBC Liver enzymes normal as well CT reassuring as well, showed hepatic steatosis, but no etiology of his symptoms Symptoms likely due to viral gastroenteritis that is improving However, due to intractable nature of his symptoms, his advanced age, and loss of appetite, requiring multiple ER visits, and discussed the patient, we discussed EGD in detail  I discussed, that  EGD may be low yield, but will help rule out any evidence of gastric outlet obstruction or ulcers Patient and family would like to proceed with EGD Due to anesthesia schedule availability, elective EGD to be done tomorrow not today Clear liquid diet today N.p.o. past midnight  EGD with Dr. Allen Norris tomorrow  No evidence of GI bleeding Patient is on Pepcid IV twice daily Continue for now Can change to PPI depending on EGD results  As an aside, patient states his son was diagnosed with colon cancer at 59 years of age I have told him and his wife, that his son's first-degree relatives, that is, his parents siblings and kids, should get screening colonoscopies at 77 years of age.  They verbalized understanding.  I told the patient that he should get a colonoscopy every 5 years due to family history.  He verbalized understanding and will schedule his colonoscopy after acute medical issues resolve as an outpatient.  Dr. Allen Norris will be on service tomorrow and will follow the patient   Thank you for involving me in the care of this patient.      LOS: 1 day   Virgel Manifold, MD  08/19/2018, 12:26 PM

## 2018-08-19 NOTE — Progress Notes (Signed)
[facility] NAME: Stephen Stein    MR#:  [id number]  DATE OF BIRTH:  1941/10/13  SUBJECTIVE:  Dry heaves+  Family in the room  REVIEW OF SYSTEMS:   Review of Systems  Constitutional: Negative for chills, fever and weight loss.  HENT: Negative for ear discharge, ear pain and nosebleeds.   Eyes: Negative for blurred vision, pain and discharge.  Respiratory: Negative for sputum production, shortness of breath, wheezing and stridor.   Cardiovascular: Negative for chest pain, palpitations, orthopnea and PND.  Gastrointestinal: Positive for nausea. Negative for abdominal pain, diarrhea and vomiting.  Genitourinary: Negative for frequency and urgency.  Musculoskeletal: Negative for back pain and joint pain.  Neurological: Negative for sensory change, speech change, focal weakness and weakness.  Psychiatric/Behavioral: Negative for depression and hallucinations. The patient is not nervous/anxious.    Tolerating Diet: cld Tolerating PT: ambulatory  DRUG ALLERGIES:  No Known Allergies  VITALS:  Blood pressure 118/80, pulse 62, temperature 97.7 F (36.5 C), temperature source Oral, resp. rate 16, height 5\' 10"  (1.778 m), weight 91.7 kg, SpO2 98 %.  PHYSICAL EXAMINATION:   Physical Exam  GENERAL:  77 y.o.-year-old patient lying in the bed with no acute distress.  EYES: Pupils equal, round, reactive to light and accommodation. No scleral icterus. Extraocular muscles intact.  HEENT: Head atraumatic, normocephalic. Oropharynx and nasopharynx clear.  NECK:  Supple, no jugular venous distention. No thyroid enlargement, no tenderness.  LUNGS: Normal breath sounds bilaterally, no wheezing, rales, rhonchi. No use of accessory muscles of respiration.  CARDIOVASCULAR: S1, S2 normal. No murmurs, rubs, or gallops.  ABDOMEN: Soft, nontender, nondistended. Bowel sounds present. No organomegaly or mass.  EXTREMITIES: No cyanosis, clubbing or  edema b/l.    NEUROLOGIC: Cranial nerves II through XII are intact. No focal Motor or sensory deficits b/l.   PSYCHIATRIC:  patient is alert and oriented x 3.  SKIN: No obvious rash, lesion, or ulcer.   LABORATORY PANEL:  CBC Recent Labs  Lab 08/19/18 0625  WBC 8.0  HGB 14.8  HCT 41.6  PLT 190    Chemistries  Recent Labs  Lab 08/18/18 1430 08/19/18 0625  NA 137 137  K 3.6 3.6  CL 104 107  CO2 22 23  GLUCOSE 113* 112*  BUN 15 13  CREATININE 1.00 0.90  CALCIUM 9.5 8.9  AST 24  --   ALT 28  --   ALKPHOS 23*  --   BILITOT 0.8  --    Cardiac Enzymes No results for input(s): TROPONINI in the last 168 hours. RADIOLOGY:  Ct Abdomen Pelvis W Contrast  Result Date: 08/18/2018 CLINICAL DATA:  Nausea, vomiting, and diarrhea for 2 weeks. No pain. EXAM: CT ABDOMEN AND PELVIS WITH CONTRAST TECHNIQUE: Multidetector CT imaging of the abdomen and pelvis was performed using the standard protocol following bolus administration of intravenous contrast. CONTRAST:  117mL ISOVUE-300 IOPAMIDOL (ISOVUE-300) INJECTION 61% COMPARISON:  None. FINDINGS: Lower chest: No acute abnormality. Hepatobiliary: Hepatic steatosis is noted. No liver masses are seen. The portal vein is patent. The gallbladder is normal. Pancreas: Unremarkable. No pancreatic ductal dilatation or surrounding inflammatory changes. Spleen: Normal in size without focal abnormality. Adrenals/Urinary Tract: A low-attenuation nodule in the left adrenal gland is stable since at least May of 2018 with an attenuation of 13 Hounsfield units and a maximum diameter of 14 mm. The right adrenal gland is normal. The kidneys, ureters, and bladder are normal. Stomach/Bowel: The stomach and  small bowel are normal. The colon and appendix are normal. Vascular/Lymphatic: Mild atherosclerotic changes are seen in the nonaneurysmal aorta. No adenopathy. Reproductive: Prostate is unremarkable. Other: There is a fat containing periumbilical hernia. No free air or  free fluid. No other acute abnormalities. Musculoskeletal: No acute or significant osseous findings. IMPRESSION: 1. No cause for the patient's symptoms identified. 2. Hepatic steatosis. 3. The low-attenuation lesion in the left adrenal gland measuring 13 mm, unchanged since May of 2018, is nonspecific but probably an adenoma. 4. Mild atherosclerotic change in the nonaneurysmal aorta. Electronically Signed   By: Dorise Bullion III M.D   On: 08/18/2018 17:52   ASSESSMENT AND PLAN:  77 year old presenting with intractable nausea vomiting  1.  Intractable nausea vomiting etiology unclear  CT of the abdomen pelvis unremarkableI will place him on prnZofran GI consult appreciated. EGD in am  2. Irregular heart rate continue atenolol  3.  Hyperlipidemia continue home Crestor  4.  Miscellaneous Lovenox for DVT prophylaxis  Case discussed with Care Management/Social Worker. Management plans discussed with the patient, family and they are in agreement.  CODE STATUS: full  DVT Prophylaxis: lovenox  TOTAL TIME TAKING CARE OF THIS PATIENT: *30* minutes.  >50% time spent on counselling and coordination of care  POSSIBLE D/C IN **1-2 DAYS, DEPENDING ON CLINICAL CONDITION.  Note: This dictation was prepared with Dragon dictation along with smaller phrase technology. Any transcriptional errors that result from this process are unintentional.  Fritzi Mandes M.D on 08/19/2018 at 1:24 PM  Between 7am to 6pm - Pager - [phone]  After 6pm go to [email or web address] - password [email or web address] [facility]  Office  [phone]  CC: Primary care physician; Arnetha Courser, MDPatient ID: Stephen Stein, male   DOB: 09/12/1941, 37 y.o.   MRN: [id number]

## 2018-08-20 ENCOUNTER — Encounter
Admission: AD | Disposition: A | Discharge status: Home / Self Care | Payer: Self-pay | Source: Ambulatory Visit | Attending: Internal Medicine

## 2018-08-20 ENCOUNTER — Ambulatory Visit: Payer: PPO | Admitting: Physical Therapy

## 2018-08-20 ENCOUNTER — Observation Stay: Payer: PPO | Admitting: Certified Registered Nurse Anesthetist

## 2018-08-20 DIAGNOSIS — I499 Cardiac arrhythmia, unspecified: Secondary | ICD-10-CM | POA: Diagnosis not present

## 2018-08-20 DIAGNOSIS — K2970 Gastritis, unspecified, without bleeding: Secondary | ICD-10-CM

## 2018-08-20 DIAGNOSIS — R112 Nausea with vomiting, unspecified: Secondary | ICD-10-CM

## 2018-08-20 DIAGNOSIS — I10 Essential (primary) hypertension: Secondary | ICD-10-CM | POA: Diagnosis not present

## 2018-08-20 DIAGNOSIS — B3781 Candidal esophagitis: Secondary | ICD-10-CM | POA: Diagnosis not present

## 2018-08-20 DIAGNOSIS — K2980 Duodenitis without bleeding: Secondary | ICD-10-CM | POA: Diagnosis not present

## 2018-08-20 DIAGNOSIS — E669 Obesity, unspecified: Secondary | ICD-10-CM | POA: Diagnosis not present

## 2018-08-20 DIAGNOSIS — E43 Unspecified severe protein-calorie malnutrition: Secondary | ICD-10-CM

## 2018-08-20 DIAGNOSIS — Z6829 Body mass index (BMI) 29.0-29.9, adult: Secondary | ICD-10-CM | POA: Diagnosis not present

## 2018-08-20 DIAGNOSIS — E785 Hyperlipidemia, unspecified: Secondary | ICD-10-CM | POA: Diagnosis not present

## 2018-08-20 DIAGNOSIS — K2990 Gastroduodenitis, unspecified, without bleeding: Secondary | ICD-10-CM | POA: Diagnosis not present

## 2018-08-20 HISTORY — PX: ESOPHAGOGASTRODUODENOSCOPY (EGD) WITH PROPOFOL: SHX5813

## 2018-08-20 LAB — KOH PREP: SPECIAL REQUESTS: NORMAL

## 2018-08-20 SURGERY — EGD (ESOPHAGOGASTRODUODENOSCOPY)
Anesthesia: Monitor Anesthesia Care | Laterality: Left

## 2018-08-20 SURGERY — ESOPHAGOGASTRODUODENOSCOPY (EGD) WITH PROPOFOL
Anesthesia: General

## 2018-08-20 SURGERY — EGD (ESOPHAGOGASTRODUODENOSCOPY)
Anesthesia: General

## 2018-08-20 MED ORDER — LIDOCAINE HCL (PF) 2 % IJ SOLN
INTRAMUSCULAR | Status: AC
  Filled 2018-08-20: qty 10

## 2018-08-20 MED ORDER — PROPOFOL 500 MG/50ML IV EMUL
INTRAVENOUS | Status: AC
  Filled 2018-08-20: qty 50

## 2018-08-20 MED ORDER — SODIUM CHLORIDE 0.9 % IV SOLN
INTRAVENOUS | Status: DC
  Administered 2018-08-20: 1000 mL via INTRAVENOUS

## 2018-08-20 MED ORDER — FLUCONAZOLE 200 MG PO TABS: 200.0000 mg | ORAL_TABLET | Freq: Every day | ORAL | 0 refills | Status: AC

## 2018-08-20 MED ORDER — PROPOFOL 10 MG/ML IV BOLUS
INTRAVENOUS | Status: DC | PRN
  Administered 2018-08-20: 20 mg via INTRAVENOUS
  Administered 2018-08-20: 60 mg via INTRAVENOUS

## 2018-08-20 MED ORDER — ENSURE ENLIVE PO LIQD: 237.0000 mL | Freq: Three times a day (TID) | ORAL | 1 refills | Status: DC

## 2018-08-20 MED ORDER — FLUCONAZOLE 100 MG PO TABS
200.0000 mg | ORAL_TABLET | Freq: Every day | ORAL | Status: DC
  Filled 2018-08-20: qty 2

## 2018-08-20 MED ORDER — LIDOCAINE HCL (CARDIAC) PF 100 MG/5ML IV SOSY
PREFILLED_SYRINGE | INTRAVENOUS | Status: DC | PRN
  Administered 2018-08-20: 100 mg via INTRAVENOUS

## 2018-08-20 MED ORDER — PROPOFOL 10 MG/ML IV BOLUS
INTRAVENOUS | Status: AC
  Filled 2018-08-20: qty 40

## 2018-08-20 MED ORDER — ENSURE ENLIVE PO LIQD: 237.0000 mL | Freq: Two times a day (BID) | ORAL | Status: DC

## 2018-08-20 NOTE — Op Note (Signed)
[facility] Patient Name: Stephen Stein Procedure Date: 08/20/2018 12:32 PM MRN: [id number] Account #: [id number] Date of Birth: [birth year] Admit Type: Inpatient Age: 77 Room: [facility] ENDO ROOM 4 Gender: Male Note Status: Finalized Procedure:            Upper GI endoscopy Indications:          Nausea with vomiting Providers:            Lucilla Lame MD, MD Referring MD:         Arnetha Courser (Referring MD) Medicines:            Propofol per Anesthesia Complications:        No immediate complications. Procedure:            Pre-Anesthesia Assessment:                       - Prior to the procedure, a History and Physical was                        performed, and patient medications and allergies were                        reviewed. The patient's tolerance of previous                        anesthesia was also reviewed. The risks and benefits of                        the procedure and the sedation options and risks were                        discussed with the patient. All questions were                        answered, and informed consent was obtained. Prior                        Anticoagulants: The patient has taken no previous                        anticoagulant or antiplatelet agents. ASA Grade                        Assessment: II - A patient with mild systemic disease.                        After reviewing the risks and benefits, the patient was                        deemed in satisfactory condition to undergo the                        procedure.                       After obtaining informed consent, the endoscope was                        passed under direct vision. Throughout the procedure,  the patient's blood pressure, pulse, and oxygen                        saturations were monitored continuously. The Endoscope                        was introduced through the mouth, and advanced to the   second part of duodenum. The upper GI endoscopy was                        accomplished without difficulty. The patient tolerated                        the procedure well. Findings:      Patchy, white plaques were found in the lower third of the esophagus.       Cells for cytology were obtained by brushing.      Localized moderate inflammation characterized by erosions was found in       the gastric antrum.      Localized moderate inflammation characterized by erythema was found in       the duodenal bulb. Impression:           - Esophageal plaques were found, consistent with                        candidiasis. Cells for cytology obtained.                       - Gastritis.                       - Duodenitis. Recommendation:       - Return patient to hospital ward for ongoing care.                       - Advance diet as tolerated.                       - The patient can be discharged from a GI point of view.                       - Use a proton pump inhibitor PO daily. Procedure Code(s):    --- Professional ---                       [phone], Esophagogastroduodenoscopy, flexible, transoral;                        diagnostic, including collection of specimen(s) by                        brushing or washing, when performed (separate procedure) Diagnosis Code(s):    --- Professional ---                       R11.2, Nausea with vomiting, unspecified                       K29.80, Duodenitis without bleeding                       K29.70, Gastritis, unspecified, without bleeding  K22.9, Disease of esophagus, unspecified CPT copyright 2017 American Medical Association. All rights reserved. The codes documented in this report are preliminary and upon coder review may  be revised to meet current compliance requirements. Lucilla Lame MD, MD 08/20/2018 1:17:06 PM This report has been signed electronically. Number of Addenda: 0 Note Initiated On: 08/20/2018 12:32 PM       [facility]

## 2018-08-20 NOTE — Brief Op Note (Signed)
Esophageal brushing for candida sent to lab

## 2018-08-20 NOTE — Transfer of Care (Signed)
Immediate Anesthesia Transfer of Care Note  Patient: Stephen Stein  Procedure(s) Performed: ESOPHAGOGASTRODUODENOSCOPY (EGD) WITH PROPOFOL (N/A )  Patient Location: Endoscopy Unit  Anesthesia Type:General  Level of Consciousness: awake, alert , oriented and patient cooperative  Airway & Oxygen Therapy: Patient Spontanous Breathing  Post-op Assessment: Report given to RN, Post -op Vital signs reviewed and stable and Patient moving all extremities  Post vital signs: Reviewed and stable  Last Vitals:  Vitals Value Taken Time  BP 114/61 08/20/2018  1:21 PM  Temp 36.1 C 08/20/2018  1:21 PM  Pulse 68 08/20/2018  1:21 PM  Resp 20 08/20/2018  1:21 PM  SpO2 97 % 08/20/2018  1:21 PM  Vitals shown include unvalidated device data.  Last Pain:  Vitals:   08/20/18 1321  TempSrc: Tympanic  PainSc:          Complications: No apparent anesthesia complications

## 2018-08-20 NOTE — Discharge Planning (Signed)
Patient IV removed. RN assessment and VS revealed stability for DC to home.  Discharge papers given, explained and educated.  Informed of suggested FU appts.  Printed scripts given.  Once ready, wheeled to front and family transporting home via car.

## 2018-08-20 NOTE — Anesthesia Preprocedure Evaluation (Signed)
Anesthesia Evaluation  Patient identified by MRN, date of birth, ID band Patient awake    Reviewed: Allergy & Precautions, NPO status , Patient's Chart, lab work & pertinent test results  History of Anesthesia Complications Negative for: history of anesthetic complications  Airway Mallampati: II       Dental  (+) Missing, Chipped   Pulmonary neg sleep apnea, neg COPD, former smoker,           Cardiovascular hypertension, Pt. on medications (-) Past MI and (-) CHF + dysrhythmias (-) Valvular Problems/Murmurs     Neuro/Psych neg Seizures    GI/Hepatic Neg liver ROS, neg GERD  ,  Endo/Other  neg diabetes  Renal/GU negative Renal ROS     Musculoskeletal   Abdominal   Peds  Hematology   Anesthesia Other Findings   Reproductive/Obstetrics                             Anesthesia Physical Anesthesia Plan  ASA: III  Anesthesia Plan: General   Post-op Pain Management:    Induction: Intravenous  PONV Risk Score and Plan: 2 and Propofol infusion and TIVA  Airway Management Planned: Nasal Cannula  Additional Equipment:   Intra-op Plan:   Post-operative Plan:   Informed Consent: I have reviewed the patients History and Physical, chart, labs and discussed the procedure including the risks, benefits and alternatives for the proposed anesthesia with the patient or authorized representative who has indicated his/her understanding and acceptance.     Plan Discussed with:   Anesthesia Plan Comments:         Anesthesia Quick Evaluation

## 2018-08-20 NOTE — Anesthesia Post-op Follow-up Note (Signed)
Anesthesia QCDR form completed.        

## 2018-08-20 NOTE — Anesthesia Postprocedure Evaluation (Signed)
Anesthesia Post Note  Patient: Stephen Stein  Procedure(s) Performed: ESOPHAGOGASTRODUODENOSCOPY (EGD) WITH PROPOFOL (N/A )  Patient location during evaluation: Endoscopy Anesthesia Type: General Level of consciousness: awake and alert Pain management: pain level controlled Vital Signs Assessment: post-procedure vital signs reviewed and stable Respiratory status: spontaneous breathing and respiratory function stable Cardiovascular status: stable Anesthetic complications: no     Last Vitals:  Vitals:   08/20/18 1240 08/20/18 1321  BP: 126/63   Pulse: (!) 58   Resp: 18   Temp: (!) 35.4 C (!) 36.1 C  SpO2: 100%     Last Pain:  Vitals:   08/20/18 1321  TempSrc: Tympanic  PainSc:                  Kim Oki K

## 2018-08-20 NOTE — OR Nursing (Signed)
Report called to [facility] on 2c. the patient with candidia,gastritis and duodenitis. Pt to return to floor . Diet as tolerated

## 2018-08-20 NOTE — Discharge Instructions (Signed)
Follow-up with primary care physician in 3 to 4 days Follow-up with gastroenterology in 1 week

## 2018-08-20 NOTE — Discharge Summary (Signed)
[facility] [place] NAME: Stephen Stein    MR#:  [id number]  DATE OF BIRTH:  1941/09/01  DATE OF ADMISSION:  08/18/2018 ADMITTING PHYSICIAN: Dustin Flock, MD  DATE OF DISCHARGE:  08/20/18  PRIMARY CARE PHYSICIAN: Arnetha Courser, MD    ADMISSION DIAGNOSIS:  nonintractable vomiting with nausea, fatigue  DISCHARGE DIAGNOSIS:  Active Problems:   Nausea and vomiting   Protein-calorie malnutrition, severe   Gastritis without bleeding   Duodenitis  Esophageal candidiasis SECONDARY DIAGNOSIS:   Past Medical History:  Diagnosis Date  . 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 BPH (benign prostatic hyperplasia)   . Hyperlipidemia   . Hypertension    For irregular heart beat  . Irregular heartbeat    been taking Atenolol for 20 years    HOSPITAL COURSE:   HISTORY OF PRESENT ILLNESS: Stephen Stein  is a 77 y.o. male with a known history of BPH, hyperlipidemia, irregular heartbeat Complains of nausea vomiting.  Patient states that he has been having these symptoms ongoing for the past few weeks.  He has been to the urgent care as well as his primary care provider and has been symptomatically treated.  He states that he is lost about 5 pounds.  He denies any abdominal pain.  He does report some occasional diarrhea.  He states that the only time he is not nauseous or throwing up is when he is sleeping.  He denies any fevers or chills.  1.Intractable nausea vomiting secondary to esophageal candidiasis, gastritis and duodenitis  CT of the abdomen pelvis unremarkableI will place him onprnZofran GI consult appreciated.  EGD -August 20, 2018 Esophageal plaques were found, consistent with candidiasis. Cells for cytology obtained. - Gastritis and duodenitis -Okay to discharge patient from GI standpoint -Patient tolerated advance diet -Stop using NSAIDs including meloxicam Discharge with p.o. Protonix once  daily and outpatient follow-up with gastroenterology or Dr. Allen Norris in 2 weeks Patient is started on Diflucan 200 mg p.o. once daily for 7 days for esophageal candidiasis  2.Irregular heart rate-stable continue atenolol  3.Hyperlipidemia continue home Crestor  4.Miscellaneous Lovenox for DVT prophylaxis   #Severe malnutrition in the context of acute illness Seen by dietitian recommending Ensure Enlive p.o. 3 times daily and multivitamin once daily ---   DISCHARGE CONDITIONS:   Stable   CONSULTS OBTAINED:  Treatment Team:  Virgel Manifold, MD   PROCEDURES EGD   DRUG ALLERGIES:  No Known Allergies  DISCHARGE MEDICATIONS:   Allergies as of 08/20/2018   No Known Allergies     Medication List    STOP taking these medications   meloxicam 15 MG tablet Commonly known as:  MOBIC     TAKE these medications   aspirin EC 81 MG tablet Take 1 tablet (81 mg total) by mouth daily.   atenolol 25 MG tablet Commonly known as:  TENORMIN Take 0.5 tablets (12.5 mg total) by mouth daily.   esomeprazole 40 MG capsule Commonly known as:  NEXIUM Take by mouth.   feeding supplement (ENSURE ENLIVE) Liqd Take 237 mLs by mouth 3 (three) times daily between meals.   fluconazole 200 MG tablet Commonly known as:  DIFLUCAN Take 1 tablet (200 mg total) by mouth daily for 10 days.   metoCLOPramide 5 MG tablet Commonly known as:  REGLAN Take 1 tablet (5 mg total) by mouth 3 (three) times daily before meals.   multivitamin tablet  Take 1 tablet by mouth daily.   prochlorperazine 10 MG tablet Commonly known as:  COMPAZINE Take by mouth.   rosuvastatin 20 MG tablet Commonly known as:  CRESTOR Take 20 mg by mouth daily as needed.   SAW PALMETTO PO Take 1 tablet by mouth daily.        DISCHARGE INSTRUCTIONS:   Follow-up with primary care physician in 3 to 4 days Follow-up with gastroenterology in 1 week  DIET:  Cardiac diet  DISCHARGE CONDITION:   Stable  ACTIVITY:  Activity as tolerated  OXYGEN:  Home Oxygen: No.   Oxygen Delivery: room air  DISCHARGE LOCATION:  home   If you experience worsening of your admission symptoms, develop shortness of breath, life threatening emergency, suicidal or homicidal thoughts you must seek medical attention immediately by calling 911 or calling your MD immediately  if symptoms less severe.  You Must read complete instructions/literature along with all the possible adverse reactions/side effects for all the Medicines you take and that have been prescribed to you. Take any new Medicines after you have completely understood and accpet all the possible adverse reactions/side effects.   Please note  You were cared for by a hospitalist during your hospital stay. If you have any questions about your discharge medications or the care you received while you were in the hospital after you are discharged, you can call the unit and asked to speak with the hospitalist on call if the hospitalist that took care of you is not available. Once you are discharged, your primary care physician will handle any further medical issues. Please note that NO REFILLS for any discharge medications will be authorized once you are discharged, as it is imperative that you return to your primary care physician (or establish a relationship with a primary care physician if you do not have one) for your aftercare needs so that they can reassess your need for medications and monitor your lab values.     Today  No chief complaint on file.  Patient had EGD.  After procedure tolerating diet okay.  Denies any nausea vomiting today okay to discharge patient from GI standpoint.  Patient wants to be discharged home  ROS:  CONSTITUTIONAL: Denies fevers, chills. Denies any fatigue, weakness.  EYES: Denies blurry vision, double vision, eye pain. EARS, NOSE, THROAT: Denies tinnitus, ear pain, hearing loss. RESPIRATORY: Denies cough,  wheeze, shortness of breath.  CARDIOVASCULAR: Denies chest pain, palpitations, edema.  GASTROINTESTINAL: Denies nausea, vomiting, diarrhea, abdominal pain. Denies bright red blood per rectum. GENITOURINARY: Denies dysuria, hematuria. ENDOCRINE: Denies nocturia or thyroid problems. HEMATOLOGIC AND LYMPHATIC: Denies easy bruising or bleeding. SKIN: Denies rash or lesion. MUSCULOSKELETAL: Denies pain in neck, back, shoulder, knees, hips or arthritic symptoms.  NEUROLOGIC: Denies paralysis, paresthesias.  PSYCHIATRIC: Denies anxiety or depressive symptoms.   VITAL SIGNS:  Blood pressure 126/63, pulse 61, temperature (!) 97 F (36.1 C), temperature source Tympanic, resp. rate 16, height 5\' 10"  (1.778 m), weight 91.7 kg, SpO2 100 %.  I/O:    Intake/Output Summary (Last 24 hours) at 08/20/2018 1505 Last data filed at 08/20/2018 1316 Gross per 24 hour  Intake 2056.06 ml  Output 700 ml  Net 1356.06 ml    PHYSICAL EXAMINATION:  GENERAL:  77 y.o.-year-old patient lying in the bed with no acute distress.  EYES: Pupils equal, round, reactive to light and accommodation. No scleral icterus. Extraocular muscles intact.  HEENT: Head atraumatic, normocephalic. Oropharynx and nasopharynx clear.  NECK:  Supple, no jugular venous  distention. No thyroid enlargement, no tenderness.  LUNGS: Normal breath sounds bilaterally, no wheezing, rales,rhonchi or crepitation. No use of accessory muscles of respiration.  CARDIOVASCULAR: S1, S2 normal. No murmurs, rubs, or gallops.  ABDOMEN: Soft, non-tender, non-distended. Bowel sounds present. No organomegaly or mass.  EXTREMITIES: No pedal edema, cyanosis, or clubbing.  NEUROLOGIC: Cranial nerves II through XII are intact. Muscle strength 5/5 in all extremities. Sensation intact. Gait not checked.  PSYCHIATRIC: The patient is alert and oriented x 3.  SKIN: No obvious rash, lesion, or ulcer.   DATA REVIEW:   CBC Recent Labs  Lab 08/19/18 0625  WBC 8.0   HGB 14.8  HCT 41.6  PLT 190    Chemistries  Recent Labs  Lab 08/18/18 1430 08/19/18 0625  NA 137 137  K 3.6 3.6  CL 104 107  CO2 22 23  GLUCOSE 113* 112*  BUN 15 13  CREATININE 1.00 0.90  CALCIUM 9.5 8.9  AST 24  --   ALT 28  --   ALKPHOS 23*  --   BILITOT 0.8  --     Cardiac Enzymes No results for input(s): TROPONINI in the last 168 hours.  Microbiology Results  Results for orders placed or performed during the hospital encounter of 08/18/18  KOH prep     Status: None   Collection Time: 08/20/18  1:12 PM  Result Value Ref Range Status   Specimen Description ESOPHAGUS  Final   Special Requests Normal  Final   KOH Prep   Final    BUDDING YEAST SEEN Performed at [facility], [address] [place], [place] [address]    Report Status 08/20/2018 FINAL  Final    RADIOLOGY:  Ct Abdomen Pelvis W Contrast  Result Date: 08/18/2018 CLINICAL DATA:  Nausea, vomiting, and diarrhea for 2 weeks. No pain. EXAM: CT ABDOMEN AND PELVIS WITH CONTRAST TECHNIQUE: Multidetector CT imaging of the abdomen and pelvis was performed using the standard protocol following bolus administration of intravenous contrast. CONTRAST:  137mL ISOVUE-300 IOPAMIDOL (ISOVUE-300) INJECTION 61% COMPARISON:  None. FINDINGS: Lower chest: No acute abnormality. Hepatobiliary: Hepatic steatosis is noted. No liver masses are seen. The portal vein is patent. The gallbladder is normal. Pancreas: Unremarkable. No pancreatic ductal dilatation or surrounding inflammatory changes. Spleen: Normal in size without focal abnormality. Adrenals/Urinary Tract: A low-attenuation nodule in the left adrenal gland is stable since at least May of 2018 with an attenuation of 13 Hounsfield units and a maximum diameter of 14 mm. The right adrenal gland is normal. The kidneys, ureters, and bladder are normal. Stomach/Bowel: The stomach and small bowel are normal. The colon and appendix are normal. Vascular/Lymphatic: Mild  atherosclerotic changes are seen in the nonaneurysmal aorta. No adenopathy. Reproductive: Prostate is unremarkable. Other: There is a fat containing periumbilical hernia. No free air or free fluid. No other acute abnormalities. Musculoskeletal: No acute or significant osseous findings. IMPRESSION: 1. No cause for the patient's symptoms identified. 2. Hepatic steatosis. 3. The low-attenuation lesion in the left adrenal gland measuring 13 mm, unchanged since May of 2018, is nonspecific but probably an adenoma. 4. Mild atherosclerotic change in the nonaneurysmal aorta. Electronically Signed   By: Dorise Bullion III M.D   On: 08/18/2018 17:52    EKG:   Orders placed or performed in visit on 08/13/18  . EKG 12-Lead      Management plans discussed with the patient, family and they are in agreement.  CODE STATUS:     Code Status Orders  (  From admission, onward)         Start     Ordered   08/18/18 1410  Full code  Continuous     08/18/18 1409        Code Status History    This patient has a current code status but no historical code status.    Advance Directive Documentation     Most Recent Value  Type of Advance Directive  Living will, Healthcare Power of Attorney  Pre-existing out of facility DNR order (yellow form or pink MOST form)  -  "MOST" Form in Place?  -      TOTAL TIME TAKING CARE OF THIS PATIENT:45  minutes.   Note: This dictation was prepared with Dragon dictation along with smaller phrase technology. Any transcriptional errors that result from this process are unintentional.   @M[facility] @  on 08/20/2018 at 3:05 PM  Between 7am to 6pm - Pager - [phone]  After 6pm go to [email or web address] - password [email or web address] [facility]  Office  [phone]  CC: Primary care physician; Arnetha Courser, MD

## 2018-08-21 ENCOUNTER — Encounter: Payer: Self-pay | Admitting: Gastroenterology

## 2018-08-21 ENCOUNTER — Telehealth: Payer: Self-pay

## 2018-08-21 NOTE — Telephone Encounter (Signed)
TOC #1. Called pt to f/u after d/c from [facility] on 08/20/18. Also wanted to confirm his hosp f/u appt w/ Suezanne Cheshire, NP on 08/22/18 @ 8:20am. Discharge planning includes the following:  - Discontinue NSAIDs and Meloxicam - Begin Protonix, MVI  Ensure Enlive and Diflucan - F/u GI in 2 weeks - F/u PCP as scheduled LVM requesting returned call.

## 2018-08-22 ENCOUNTER — Encounter: Payer: Self-pay | Admitting: Nurse Practitioner

## 2018-08-22 ENCOUNTER — Ambulatory Visit (INDEPENDENT_AMBULATORY_CARE_PROVIDER_SITE_OTHER): Payer: PPO | Admitting: Nurse Practitioner

## 2018-08-22 VITALS — BP 106/80 | HR 90 | Temp 97.4°F | Resp 16 | Ht 70.0 in | Wt 207.3 lb

## 2018-08-22 DIAGNOSIS — R110 Nausea: Secondary | ICD-10-CM | POA: Diagnosis not present

## 2018-08-22 DIAGNOSIS — K2900 Acute gastritis without bleeding: Secondary | ICD-10-CM | POA: Diagnosis not present

## 2018-08-22 DIAGNOSIS — Z09 Encounter for follow-up examination after completed treatment for conditions other than malignant neoplasm: Secondary | ICD-10-CM

## 2018-08-22 DIAGNOSIS — B379 Candidiasis, unspecified: Secondary | ICD-10-CM

## 2018-08-22 NOTE — Patient Instructions (Addendum)
-  Please continue taking fluconazole until completely finished with medicine - Please continue esomeprazole every day  - Please take nausea medicine at least twice a day until you are no longer vomiting, then can take as needed.  - Take sips and small bites throughout to day.  - Stop aspirin until visit with Dr. Allen Norris; Contact Dr. Rockey Situ to see if you need to re-start on this.  - Please hold on atenolol 12.5mg  for today.   Goals from Nutritionist:  Kcal:  1990-2155 (MSJ x 1.2-1.3) Protein:  100-110 grams (1.1-1.2 grams/kg) Fluid:  1.8-2 L/day

## 2018-08-22 NOTE — Progress Notes (Signed)
Name: Stephen Stein   MRN: [id number]    DOB: [birth year]   Date:08/22/2018       Progress Note  Subjective  Chief Complaint  Chief Complaint  Patient presents with  . Nausea  . Emesis  . Diarrhea    HPI  Patient presents for hospital follow-up was admitted on 08/18/2018 for intermittent vomiting and diarrhea ongoing for 2 weeks during hospitalization had endopscopy revealing gastritis, duodenitis, and candiddiasis. He was discharged on 08/20/2018. Plan outpatient to stop NSAIDs, continue PPI, dietician reccomened ensure enlive  TID and multivitamin.  TOC completed by Ammie LPN on 01/19/5808.  Still having nausea, and some dizziness with position changes and unsteady. No falls. No diarrhea. Last vomit was last night- when trying to drinking entire ensure in one sitting. Doesn't want to drink meal replacement. Has been taking fluconazole daily, but didn't take PPI or nausea medicine.    Wt Readings from Last 3 Encounters:  08/22/18 207 lb 4.8 oz (94 kg)  08/18/18 202 lb 2.6 oz (91.7 kg)  08/18/18 205 lb (93 kg)     Patient Active Problem List   Diagnosis Date Noted  . Protein-calorie malnutrition, severe 08/20/2018  . Gastritis without bleeding   . Duodenitis   . Nausea and vomiting 08/18/2018  . Left bundle branch block 06/25/2018  . Second degree AV block, Mobitz type I 06/25/2018  . Adrenal adenoma 05/09/2017  . Pulmonary nodules 04/19/2017  . Medication monitoring encounter 04/19/2017  . BPH (benign prostatic hyperplasia) 04/19/2017  . Pain in wrist 01/20/2017  . Elevated serum creatinine 10/04/2016  . 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 CAD (coronary artery disease) 03/25/2016  . Abnormal EKG 12/16/2015  . Hyperlipidemia 07/07/2015  . Essential hypertension 07/07/2015  . Hyperglycemia 07/07/2015  . Obesity 07/07/2015    Past Medical History:  Diagnosis Date  . 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 BPH (benign prostatic  hyperplasia)   . Hyperlipidemia   . Hypertension    For irregular heart beat  . Irregular heartbeat    been taking Atenolol for 20 years    Past Surgical History:  Procedure Laterality Date  . COLONOSCOPY  2008  . ESOPHAGOGASTRODUODENOSCOPY (EGD) WITH PROPOFOL N/A 08/20/2018   Procedure: ESOPHAGOGASTRODUODENOSCOPY (EGD) WITH PROPOFOL;  Surgeon: Lucilla Lame, MD;  Location: [facility] ENDOSCOPY;  Service: Endoscopy;  Laterality: N/A;  . TONSILLECTOMY AND ADENOIDECTOMY  1955    Social History   Tobacco Use  . Smoking status: Former Smoker    Last attempt to quit: 06/22/1992    Years since quitting: 26.1  . Smokeless tobacco: Former Systems developer    Quit date: 06/22/1992  Substance Use Topics  . Alcohol use: Yes    Alcohol/week: 1.0 standard drinks    Types: 1 Cans of beer per week    Comment: occasional     Current Outpatient Medications:  .  aspirin EC 81 MG tablet, Take 1 tablet (81 mg total) by mouth daily., Disp: , Rfl:  .  atenolol (TENORMIN) 25 MG tablet, Take 0.5 tablets (12.5 mg total) by mouth daily., Disp: 90 tablet, Rfl: 0 .  esomeprazole (NEXIUM) 40 MG capsule, Take by mouth., Disp: , Rfl:  .  feeding supplement, ENSURE ENLIVE, (ENSURE ENLIVE) LIQD, Take 237 mLs by mouth 3 (three) times daily between meals., Disp: 90 Bottle, Rfl: 1 .  fluconazole (DIFLUCAN) 200 MG tablet, Take 1 tablet (200 mg total) by mouth daily for 10 days., Disp:  10 tablet, Rfl: 0 .  metoCLOPramide (REGLAN) 5 MG tablet, Take 1 tablet (5 mg total) by mouth 3 (three) times daily before meals., Disp: 15 tablet, Rfl: 1 .  Multiple Vitamin (MULTIVITAMIN) tablet, Take 1 tablet by mouth daily., Disp: , Rfl:  .  prochlorperazine (COMPAZINE) 10 MG tablet, Take by mouth., Disp: , Rfl:  .  rosuvastatin (CRESTOR) 20 MG tablet, Take 20 mg by mouth daily as needed. , Disp: , Rfl:  .  Saw Palmetto, Serenoa repens, (SAW PALMETTO PO), Take 1 tablet by mouth daily. , Disp: , Rfl:   No Known Allergies  Review of Systems   Constitutional: Positive for malaise/fatigue. Negative for chills and fever.  Eyes: Negative for blurred vision and double vision.  Respiratory: Negative for cough and sputum production.   Cardiovascular: Negative for chest pain and palpitations.  Gastrointestinal: Positive for nausea. Negative for abdominal pain, constipation, diarrhea, heartburn and vomiting.  Musculoskeletal: Negative for back pain and myalgias.  Skin: Negative for rash.  Neurological: Positive for dizziness. Negative for focal weakness and headaches.  Psychiatric/Behavioral: The patient is not nervous/anxious and does not have insomnia.     No other specific complaints in a complete review of systems (except as listed in HPI above).  Objective  Vitals:   08/22/18 0809  BP: 106/80  Pulse: 90  Resp: 16  Temp: (!) 97.4 F (36.3 C)  TempSrc: Oral  SpO2: 99%  Weight: 207 lb 4.8 oz (94 kg)  Height: 5\' 10"  (1.778 m)    Body mass index is 29.74 kg/m.  Nursing Note and Vital Signs reviewed.  Physical Exam  Constitutional: He is oriented to person, place, and time. He appears well-developed and well-nourished. No distress.  Neck: Normal range of motion.  Cardiovascular: Normal rate, regular rhythm and intact distal pulses.  Pulmonary/Chest: Breath sounds normal.  Abdominal: Soft. Bowel sounds are normal. There is no tenderness.  Musculoskeletal: Normal range of motion.  Neurological: He is alert and oriented to person, place, and time. Coordination normal.  Skin: Skin is warm and dry. He is not diaphoretic. No erythema.  Psychiatric: He has a normal mood and affect. His behavior is normal. Judgment and thought content normal.  Nursing note and vitals reviewed.     Results for orders placed or performed during the hospital encounter of 08/18/18 (from the past 48 hour(s))  KOH prep     Status: None   Collection Time: 08/20/18  1:12 PM  Result Value Ref Range   Specimen Description ESOPHAGUS    Special  Requests Normal    KOH Prep      BUDDING YEAST SEEN Performed at [facility], [address], [place], [place] [address]    Report Status 08/20/2018 FINAL     Assessment & Plan   1. Hospital discharge follow-up Medications reconciled.  Hold aspirin for one week and discuss with Dr. Rockey Situ; per aspirin calculator does not appear necessary but would like specialist to decide. Please take PPI and nausea medicine. Does not want to take ensures- discussed other flavors and dieticians recommendation for protein, Kcal and fluids.   - Hold atenolol tonight due to symptomatic hypotension- increase fluids.   2. Acute gastritis without hemorrhage, unspecified gastritis type -Please continue taking fluconazole until completely finished with medicine - Please continue esomeprazole every day  - Please take nausea medicine at least twice a day until you are no longer vomiting, then can take as needed.  - Take sips and small bites throughout  to day.  - Stop aspirin until visit with Dr. Allen Norris; Contact Dr. Rockey Situ to see if you need to re-start on this.  - Please hold on atenolol 12.5mg  for today.   3. Candidiasis -continue fluconazole   4. Nausea -compazine

## 2018-08-23 ENCOUNTER — Encounter: Payer: PPO | Admitting: Physical Therapy

## 2018-08-24 ENCOUNTER — Telehealth: Payer: Self-pay | Admitting: Gastroenterology

## 2018-08-24 NOTE — Telephone Encounter (Signed)
Patients wife contacted office.  Stated her husband had EGD 08/20/18.  He has completed half the course of his antibiotics and is taking the Nexium as advised.  Since the EGD patient has been feeling light headed, nauseated, experiencing dry heaves.  She also said he is lethargic.  Has compazine to take but hasn't taken in 2 days because it increases the nausea.  No pain is present.  Please advise.  Thanks [employer]

## 2018-08-24 NOTE — Telephone Encounter (Signed)
Pt wife is calling to speak with nurse she states pt had procedure with Dr. Allen Norris and is still having symtoms she would like a call please

## 2018-08-24 NOTE — Telephone Encounter (Signed)
The patient was admitted with nausea and vomiting and had all these symptoms prior to me being consulted to see the patient. Please find out what has changed. Nothing done during the EGD would explain lightheadedness. If feeling porly then he should consider going to ER.

## 2018-08-24 NOTE — Telephone Encounter (Signed)
Dr. Allen Norris.  Paitents wife said nothing has changed.  I informed her that nothing done during the EGD would explain lightheadedness.  I advised her to take him to Er.  She said she will go to the Urgent Care with him.  Thanks [employer]

## 2018-08-28 ENCOUNTER — Encounter: Payer: PPO | Admitting: Physical Therapy

## 2018-08-30 ENCOUNTER — Ambulatory Visit: Payer: PPO | Admitting: Gastroenterology

## 2018-08-30 ENCOUNTER — Encounter: Payer: PPO | Admitting: Physical Therapy

## 2018-08-30 ENCOUNTER — Encounter: Payer: Self-pay | Admitting: Gastroenterology

## 2018-08-30 VITALS — BP 141/82 | HR 94 | Ht 70.0 in | Wt 198.0 lb

## 2018-08-30 DIAGNOSIS — G43A1 Cyclical vomiting, intractable: Secondary | ICD-10-CM | POA: Diagnosis not present

## 2018-08-30 DIAGNOSIS — R1115 Cyclical vomiting syndrome unrelated to migraine: Secondary | ICD-10-CM

## 2018-08-30 MED ORDER — CLONAZEPAM 0.5 MG PO TABS: 0.5000 mg | ORAL_TABLET | Freq: Two times a day (BID) | ORAL | 0 refills | Status: DC | PRN

## 2018-08-30 NOTE — Progress Notes (Signed)
Primary Care Physician: Arnetha Courser, MD  Primary Gastroenterologist:  Dr. Lucilla Lame  Chief Complaint  Patient presents with  . Hospitalization Follow-up    HPI: Stephen Stein is a 77 y.o. male here for follow-up after being discharged from the hospital.  The patient has lost approximately 25 pounds since his nausea and vomiting had started.  The patient had an upper endoscopy while in the hospital without any cause for his nausea vomiting seen.  The patient reports that his symptoms started 2 days after he was given antibiotics for possible prostatitis.  He states that he will have dry heaving with nausea and vomiting unrelated to eating or drinking.  He usually has stress or even putting in his teeth as the cause of his gagging.  He also reports that he has some dizziness with the nausea and he also has what he reports to be a odor from his body that he relates to the symptoms that he has not had before despite staying showered and cleaned.  The patient did have a CT scan of the head that did not show any sign of a central cause for his nausea vomiting.  The patient also had a CT scan of the abdomen that did not show any gallbladder issues.  The patient does report that he feels more nausea and vomiting when he is stressed.  Current Outpatient Medications  Medication Sig Dispense Refill  . esomeprazole (NEXIUM) 40 MG capsule Take by mouth.    . rosuvastatin (CRESTOR) 20 MG tablet Take 20 mg by mouth daily as needed.     [name] aspirin EC 81 MG tablet Take 1 tablet (81 mg total) by mouth daily.    [name] atenolol (TENORMIN) 25 MG tablet Take 0.5 tablets (12.5 mg total) by mouth daily. (Patient not taking: Reported on 08/30/2018) 90 tablet 0  . clonazePAM (KLONOPIN) 0.5 MG tablet Take 1 tablet (0.5 mg total) by mouth 2 (two) times daily as needed for anxiety. 60 tablet 0  . feeding supplement, ENSURE ENLIVE, (ENSURE ENLIVE) LIQD Take 237 mLs by mouth 3 (three) times daily between meals. (Patient not  taking: Reported on 08/30/2018) 90 Bottle 1  . fluconazole (DIFLUCAN) 200 MG tablet Take 1 tablet (200 mg total) by mouth daily for 10 days. (Patient not taking: Reported on 08/30/2018) 10 tablet 0  . Multiple Vitamin (MULTIVITAMIN) tablet Take 1 tablet by mouth daily.    . prochlorperazine (COMPAZINE) 10 MG tablet Take by mouth.    . Saw Palmetto, Serenoa repens, (SAW PALMETTO PO) Take 1 tablet by mouth daily.      No current facility-administered medications for this visit.     Allergies as of 08/30/2018  . (No Known Allergies)    ROS:  General: Negative for anorexia, weight loss, fever, chills, fatigue, weakness. ENT: Negative for hoarseness, difficulty swallowing , nasal congestion. CV: Negative for chest pain, angina, palpitations, dyspnea on exertion, peripheral edema.  Respiratory: Negative for dyspnea at rest, dyspnea on exertion, cough, sputum, wheezing.  GI: See history of present illness. GU:  Negative for dysuria, hematuria, urinary incontinence, urinary frequency, nocturnal urination.  Endo: Negative for unusual weight change.    Physical Examination:   BP (!) 141/82   Pulse 94   Ht 5\' 10"  (1.778 m)   Wt 198 lb (89.8 kg)   BMI 28.41 kg/m   General: Well-nourished, well-developed in no acute distress.  Eyes: No icterus. Conjunctivae pink. Extremities: No lower extremity edema. No clubbing or  deformities. Neuro: Alert and oriented x 3.  Grossly intact. Skin: Warm and dry, no jaundice.   Psych: Alert and cooperative, normal mood and affect.  Labs:    Imaging Studies: Ct Head Wo Contrast  Result Date: 08/13/2018 CLINICAL DATA:  Dizziness.  Lethargy, tinnitus both ears EXAM: CT HEAD WITHOUT CONTRAST TECHNIQUE: Contiguous axial images were obtained from the base of the skull through the vertex without intravenous contrast. COMPARISON:  None. FINDINGS: Brain: Cerebral volume normal for age. Negative for hydrocephalus. Negative for acute infarct, hemorrhage, or mass.  Vascular: Negative for hyperdense vessel Skull: Negative Sinuses/Orbits: Prior sinus surgery. Mucosal edema in the paranasal sinuses. Normal orbit Other: None IMPRESSION: No acute abnormality.  Age related cerebral volume loss. Electronically Signed   By: Franchot Gallo M.D.   On: 08/13/2018 12:21   Ct Abdomen Pelvis W Contrast  Result Date: 08/18/2018 CLINICAL DATA:  Nausea, vomiting, and diarrhea for 2 weeks. No pain. EXAM: CT ABDOMEN AND PELVIS WITH CONTRAST TECHNIQUE: Multidetector CT imaging of the abdomen and pelvis was performed using the standard protocol following bolus administration of intravenous contrast. CONTRAST:  123mL ISOVUE-300 IOPAMIDOL (ISOVUE-300) INJECTION 61% COMPARISON:  None. FINDINGS: Lower chest: No acute abnormality. Hepatobiliary: Hepatic steatosis is noted. No liver masses are seen. The portal vein is patent. The gallbladder is normal. Pancreas: Unremarkable. No pancreatic ductal dilatation or surrounding inflammatory changes. Spleen: Normal in size without focal abnormality. Adrenals/Urinary Tract: A low-attenuation nodule in the left adrenal gland is stable since at least May of 2018 with an attenuation of 13 Hounsfield units and a maximum diameter of 14 mm. The right adrenal gland is normal. The kidneys, ureters, and bladder are normal. Stomach/Bowel: The stomach and small bowel are normal. The colon and appendix are normal. Vascular/Lymphatic: Mild atherosclerotic changes are seen in the nonaneurysmal aorta. No adenopathy. Reproductive: Prostate is unremarkable. Other: There is a fat containing periumbilical hernia. No free air or free fluid. No other acute abnormalities. Musculoskeletal: No acute or significant osseous findings. IMPRESSION: 1. No cause for the patient's symptoms identified. 2. Hepatic steatosis. 3. The low-attenuation lesion in the left adrenal gland measuring 13 mm, unchanged since May of 2018, is nonspecific but probably an adenoma. 4. Mild atherosclerotic  change in the nonaneurysmal aorta. Electronically Signed   By: Dorise Bullion III M.D   On: 08/18/2018 17:52    Assessment and Plan:   Stephen Stein is a 58 y.o. y/o male who comes in today with a history of nausea vomiting and was in the hospital with an EGD that did not show any cause for his nausea vomiting.  The patient appears to have anxiety that triggers his symptoms whereas food and eating do not.  The patient will be started on a low dose of Klonopin.  If the symptoms do not improve the patient may need a gallbladder emptying study for completeness of the work-up.  The patient has been explained the plan and agrees with it.    Lucilla Lame, MD. Marval Regal   Note: This dictation was prepared with Dragon dictation along with smaller phrase technology. Any transcriptional errors that result from this process are unintentional.

## 2018-09-01 ENCOUNTER — Ambulatory Visit
Admission: EM | Admit: 2018-09-01 | Discharge: 2018-09-01 | Disposition: A | Discharge status: Home / Self Care | Payer: PPO | Attending: Family Medicine | Admitting: Family Medicine

## 2018-09-01 ENCOUNTER — Other Ambulatory Visit: Payer: Self-pay

## 2018-09-01 DIAGNOSIS — R5383 Other fatigue: Secondary | ICD-10-CM

## 2018-09-01 DIAGNOSIS — R110 Nausea: Secondary | ICD-10-CM | POA: Diagnosis not present

## 2018-09-01 MED ORDER — ERYTHROMYCIN BASE 500 MG PO TABS: 500.0000 mg | ORAL_TABLET | Freq: Three times a day (TID) | ORAL | 0 refills | Status: DC

## 2018-09-01 NOTE — Discharge Instructions (Addendum)
Call Dr. Allen Norris on Monday.  Meds as prescribed.  Use the Klonopin if desired.  Take care  Dr. Lacinda Axon

## 2018-09-01 NOTE — ED Triage Notes (Signed)
Pt seen multiple times for same thing in past month. Had EGD done and yeast was found. States he has finished his yeast medication and saw his GI on 9/5 who thinks this is related to anxiety. Still with nausea, no diarrhea, no pain, and just "dry heaves". Taking Ensure TID along with water to keep up with nutrition.

## 2018-09-01 NOTE — ED Provider Notes (Signed)
[facility]    CSN: [id number] Arrival date & time: 09/01/18  1210  History   Chief Complaint Chief Complaint  Patient presents with  . Nausea   HPI  77 year old male presents with persistent nausea.  Patient is well-known to me.  He has been seen several times recently for the same complaint.  He recently had CT scan of the abdomen, CT head, upper endoscopy as part of work-up.  Other than his endoscopy revealing esophageal candidiasis as well as gastritis and duodenitis his work-up has been negative.  He is currently on Nexium and has finished a course of Diflucan.  He continues to have nausea.  He has recently seen GI Dr. Allen Norris who placed him on Klonopin.  Patient states that he has chronic ongoing nausea.  He often gags and retches after putting his teeth in.  Patient states that this morning he gagged after water hit his lips.  He is able to tolerate liquids and foods but feels nauseous as he is doing so.  He states that he does not vomit very much.  He does not vomit very often.  He did vomit recently after eating [employer] following his visit with GI.  No reports of vomiting after meals to suggest rumination syndrome.  Patient has been taking an Ensure with minimal difficulty.  Continues to complain of nausea.  He has tried multiple drugs in the past without improvement.  No complaints of abdominal pain.  PMH, Surgical Hx, Family Hx, Social History reviewed and updated as below.  Past Medical History:  Diagnosis Date  . 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 BPH (benign prostatic hyperplasia)   . Hyperlipidemia   . Hypertension    For irregular heart beat  . Irregular heartbeat    been taking Atenolol for 20 years    Patient Active Problem List   Diagnosis Date Noted  . Protein-calorie malnutrition, severe 08/20/2018  . Gastritis without bleeding   . Duodenitis   . Nausea and vomiting 08/18/2018  . Left bundle branch block 06/25/2018    . Second degree AV block, Mobitz type I 06/25/2018  . Adrenal adenoma 05/09/2017  . Pulmonary nodules 04/19/2017  . Medication monitoring encounter 04/19/2017  . BPH (benign prostatic hyperplasia) 04/19/2017  . Pain in wrist 01/20/2017  . Elevated serum creatinine 10/04/2016  . 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 CAD (coronary artery disease) 03/25/2016  . Abnormal EKG 12/16/2015  . Hyperlipidemia 07/07/2015  . Essential hypertension 07/07/2015  . Hyperglycemia 07/07/2015  . Obesity 07/07/2015    Past Surgical History:  Procedure Laterality Date  . COLONOSCOPY  2008  . ESOPHAGOGASTRODUODENOSCOPY (EGD) WITH PROPOFOL N/A 08/20/2018   Procedure: ESOPHAGOGASTRODUODENOSCOPY (EGD) WITH PROPOFOL;  Surgeon: Lucilla Lame, MD;  Location: [facility] ENDOSCOPY;  Service: Endoscopy;  Laterality: N/A;  . [place] Medications    Prior to Admission medications   Medication Sig Start Date End Date Taking? Authorizing Provider  erythromycin base (E-MYCIN) 500 MG tablet Take 1 tablet (500 mg total) by mouth 3 (three) times daily. 09/01/18   Coral Spikes, DO  esomeprazole (NEXIUM) 40 MG capsule Take by mouth. 08/16/18   [provider]  feeding supplement, ENSURE ENLIVE, (ENSURE ENLIVE) LIQD Take 237 mLs by mouth 3 (three) times daily between meals. Patient not taking: Reported on 08/30/2018 08/20/18   Nicholes Mango, MD  Multiple Vitamin (MULTIVITAMIN) tablet Take  1 tablet by mouth daily.    [provider]    Family History Family History  Problem Relation Age of Onset  . Cancer Mother        lung  . Heart disease Father   . Hearing loss Sister        coclear implant  . Hearing loss Brother   . Cancer Paternal Grandmother   . Cancer Son        colon cancer  . Colon cancer Neg Hx   . Stomach cancer Neg Hx     Social History Social History   Tobacco Use  . Smoking status: Former Smoker    Last attempt to quit:  06/22/1992    Years since quitting: 26.2  . Smokeless tobacco: Former Systems developer    Quit date: 06/22/1992  Substance Use Topics  . Alcohol use: Yes    Alcohol/week: 1.0 standard drinks    Types: 1 Cans of beer per week    Comment: occasional  . Drug use: No     Allergies   Patient has no known allergies.   Review of Systems Review of Systems  Constitutional: Positive for fatigue.  Gastrointestinal: Positive for nausea.   Physical Exam Triage Vital Signs ED Triage Vitals  Enc Vitals Group     BP 09/01/18 1225 102/72     Pulse Rate 09/01/18 1225 77     Resp 09/01/18 1225 18     Temp 09/01/18 1225 (!) 97.4 F (36.3 C)     Temp Source 09/01/18 1225 Oral     SpO2 09/01/18 1225 99 %     Weight 09/01/18 1226 197 lb (89.4 kg)     Height 09/01/18 1226 5\' 10"  (1.778 m)     Head Circumference --      Peak Flow --      Pain Score 09/01/18 1314 0     Pain Loc --      Pain Edu? --      Excl. in [place]? --    Updated Vital Signs BP 102/72 (BP Location: Left Arm)   Pulse 77   Temp (!) 97.4 F (36.3 C) (Oral)   Resp 18   Ht 5\' 10"  (1.778 m)   Wt 89.4 kg   SpO2 99%   BMI 28.27 kg/m   Visual Acuity Right Eye Distance:   Left Eye Distance:   Bilateral Distance:    Right Eye Near:   Left Eye Near:    Bilateral Near:     Physical Exam  Constitutional: He is oriented to person, place, and time. He appears well-developed. No distress.  HENT:  Head: Normocephalic and atraumatic.  Oropharynx slightly dry.  Cardiovascular: Normal rate and regular rhythm.  Pulmonary/Chest: Effort normal. No respiratory distress.  Abdominal: Soft. He exhibits no distension. There is no tenderness.  Neurological: He is alert and oriented to person, place, and time.  Psychiatric: His behavior is normal.  Flat affect.  Nursing note and vitals reviewed.  UC Treatments / Results  Labs (all labs ordered are listed, but only abnormal results are displayed) Labs Reviewed - No data to  display  EKG None  Radiology No results found.  Procedures Procedures (including critical care time)  Medications Ordered in UC Medications - No data to display  Initial Impression / Assessment and Plan / UC Course  I have reviewed the triage vital signs and the nursing notes.  Pertinent labs & imaging results that were available during my care of the  patient were reviewed by me and considered in my medical decision making (see chart for details).    77 year old male presents with persistent nausea.  The etiology is unclear at this time.  I have encouraged him to stop atenolol and Crestor which could possibly be causing nausea.  This seems most consistent with a behavioral aspect as he is able to eat and drink.  Gags with certain triggers.  Does not appear to be rumination syndrome.  I am unsure what the exact cause is.  Trial of erythromycin to encourage gut motility.  Contact GI on Monday.  Final Clinical Impressions(s) / UC Diagnoses   Final diagnoses:  Nausea     Discharge Instructions     Call Dr. Allen Norris on Monday.  Meds as prescribed.  Use the Klonopin if desired.  Take care  Dr. Lacinda Axon    ED Prescriptions    Medication Sig Dispense Auth. Provider   erythromycin base (E-MYCIN) 500 MG tablet Take 1 tablet (500 mg total) by mouth 3 (three) times daily. 30 tablet Coral Spikes, DO     Controlled Substance Prescriptions [place] Controlled Substance Registry consulted? Not Applicable   Coral Spikes, DO 09/01/18 1353

## 2018-09-03 ENCOUNTER — Encounter: Payer: Self-pay | Admitting: Family Medicine

## 2018-09-03 ENCOUNTER — Other Ambulatory Visit: Payer: Self-pay

## 2018-09-03 ENCOUNTER — Ambulatory Visit (INDEPENDENT_AMBULATORY_CARE_PROVIDER_SITE_OTHER): Payer: PPO | Admitting: Nurse Practitioner

## 2018-09-03 ENCOUNTER — Encounter: Payer: Self-pay | Admitting: Nurse Practitioner

## 2018-09-03 ENCOUNTER — Telehealth: Payer: Self-pay | Admitting: Gastroenterology

## 2018-09-03 ENCOUNTER — Telehealth: Payer: Self-pay | Admitting: Family Medicine

## 2018-09-03 VITALS — BP 110/64 | HR 99 | Temp 97.7°F | Resp 16 | Ht 70.0 in | Wt 195.0 lb

## 2018-09-03 DIAGNOSIS — R112 Nausea with vomiting, unspecified: Secondary | ICD-10-CM

## 2018-09-03 DIAGNOSIS — R42 Dizziness and giddiness: Secondary | ICD-10-CM

## 2018-09-03 MED ORDER — PROMETHAZINE HCL 12.5 MG PO TABS: 12.5000 mg | ORAL_TABLET | Freq: Three times a day (TID) | ORAL | 0 refills | Status: DC | PRN

## 2018-09-03 NOTE — Telephone Encounter (Signed)
Spoke with pt's wife and advise her of pt's HIDA scan appt. This was recommended by Dr. Allen Norris from his office note dated on 08/30/18 if symptoms do not improve. This has been scheduled at [facility] on Monday, Sept 23rd at 8:30am. [facility] at 8:00am. Wife has been instructed to have pt hold any pain medications and to be NPO after midnight. Advised her this was the soonest they had available. I gave her central scheduling's number to check for any cancellations in the next few days.

## 2018-09-03 NOTE — Telephone Encounter (Signed)
Yes please continue pepcid, can try to continue antibiotic as well but if having vomiting again please stop.

## 2018-09-03 NOTE — Progress Notes (Signed)
Name: Stephen Stein   MRN: [id number]    DOB: [birth year]   Date:09/03/2018       Progress Note  Subjective  Chief Complaint  Chief Complaint  Patient presents with  . Dizziness    all the time feels swimmy headed  . Nausea  . Anorexia    still has no appetitie.  . Emesis    HPI  Patient has been seen several times for these complaints with no real improvement with IV hydration, multiple anti-emetics, PPI's, various diet plans, and treatment of gastric candida noted in endoscopy with diflucan. Patient was recently started on erythromycin to increase gut motility.   Nothing has improved and dizziness and nausea has worsened, and balance is worse.  Dizziness is worse upon awakening, or sitting up. No energy. Nausea is worse with putting teeth in or anything in mouth. Patient has vomited up a couple of his erythromycin up. Was able to keep antacid down. Not taking nausea medicine at this time. Wife notes decreased memory. Sleeping 18-20 hours a day.   Wt Readings from Last 3 Encounters:  09/03/18 195 lb (88.5 kg)  09/01/18 197 lb (89.4 kg)  08/30/18 198 lb (89.8 kg)  Weight when I originally saw him on 8/13 was 214lbs. Positive 19 pound weight loss in one month.       Patient Active Problem List   Diagnosis Date Noted  . Protein-calorie malnutrition, severe 08/20/2018  . Gastritis without bleeding   . Duodenitis   . Nausea and vomiting 08/18/2018  . Left bundle branch block 06/25/2018  . Second degree AV block, Mobitz type I 06/25/2018  . Adrenal adenoma 05/09/2017  . Pulmonary nodules 04/19/2017  . Medication monitoring encounter 04/19/2017  . BPH (benign prostatic hyperplasia) 04/19/2017  . Pain in wrist 01/20/2017  . Elevated serum creatinine 10/04/2016  . 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 CAD (coronary artery disease) 03/25/2016  . Abnormal EKG 12/16/2015  . Hyperlipidemia 07/07/2015  . Essential hypertension 07/07/2015  . Hyperglycemia 07/07/2015  .  Obesity 07/07/2015    Past Medical History:  Diagnosis Date  . 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 BPH (benign prostatic hyperplasia)   . Hyperlipidemia   . Hypertension    For irregular heart beat  . Irregular heartbeat    been taking Atenolol for 20 years    Past Surgical History:  Procedure Laterality Date  . COLONOSCOPY  2008  . ESOPHAGOGASTRODUODENOSCOPY (EGD) WITH PROPOFOL N/A 08/20/2018   Procedure: ESOPHAGOGASTRODUODENOSCOPY (EGD) WITH PROPOFOL;  Surgeon: Lucilla Lame, MD;  Location: [facility] ENDOSCOPY;  Service: Endoscopy;  Laterality: N/A;  . TONSILLECTOMY AND ADENOIDECTOMY  1955    Social History   Tobacco Use  . Smoking status: Former Smoker    Last attempt to quit: 06/22/1992    Years since quitting: 26.2  . Smokeless tobacco: Former Systems developer    Quit date: 06/22/1992  Substance Use Topics  . Alcohol use: Yes    Alcohol/week: 1.0 standard drinks    Types: 1 Cans of beer per week    Comment: occasional     Current Outpatient Medications:  .  erythromycin base (E-MYCIN) 500 MG tablet, Take 1 tablet (500 mg total) by mouth 3 (three) times daily., Disp: 30 tablet, Rfl: 0 .  esomeprazole (NEXIUM) 40 MG capsule, Take by mouth., Disp: , Rfl:  .  feeding supplement, ENSURE ENLIVE, (ENSURE ENLIVE) LIQD, Take 237 mLs by mouth 3 (three) times daily  between meals., Disp: 90 Bottle, Rfl: 1 .  Multiple Vitamin (MULTIVITAMIN) tablet, Take 1 tablet by mouth daily., Disp: , Rfl:   No Known Allergies  Review of Systems  Constitutional: Positive for malaise/fatigue and weight loss. Negative for chills, diaphoresis and fever.  HENT: Negative for hearing loss and tinnitus.   Eyes: Negative for blurred vision, double vision and photophobia.  Respiratory: Negative for cough and sputum production.   Cardiovascular: Negative for chest pain and palpitations.  Gastrointestinal: Positive for nausea and vomiting. Negative for abdominal pain, constipation and  diarrhea.  Genitourinary: Negative for dysuria and urgency.  Musculoskeletal: Negative for back pain and myalgias.  Skin: Negative for rash.  Neurological: Positive for dizziness and weakness. Negative for tingling, focal weakness and headaches.  Psychiatric/Behavioral: Positive for depression. The patient is not nervous/anxious and does not have insomnia.      No other specific complaints in a complete review of systems (except as listed in HPI above).  Objective  Vitals:   09/03/18 1015  BP: 110/64  Pulse: 99  Resp: 16  Temp: 97.7 F (36.5 C)  TempSrc: Oral  SpO2: 98%  Weight: 195 lb (88.5 kg)  Height: 5\' 10"  (1.778 m)     Body mass index is 27.98 kg/m.  Nursing Note and Vital Signs reviewed.  Physical Exam  Constitutional: He is oriented to person, place, and time. No distress.  Eyes: Conjunctivae are normal.  Neck: Normal range of motion. Neck supple.  Cardiovascular: Normal rate, regular rhythm and intact distal pulses.  Pulmonary/Chest: Effort normal and breath sounds normal.  Abdominal: Soft. Bowel sounds are normal. He exhibits no mass. There is no tenderness. There is no guarding.  Neurological: He is alert and oriented to person, place, and time. Coordination normal.  Skin: Skin is warm and dry. He is not diaphoretic.  Psychiatric: His speech is normal and behavior is normal. Judgment and thought content normal. He exhibits a depressed mood.      No results found for this or any previous visit (from the past 48 hour(s)).  Assessment & Plan  1. Nausea and vomiting, intractability of vomiting not specified, unspecified vomiting type Discussed starting meclizine for potential vertigo worsening symptoms; states would prefer phenergan first; will inform us is unimproved and will stop and switch. Discussed increase fall potential due to age and off balance with patient and wife; will start at low dose.  - promethazine (PHENERGAN) 12.5 MG tablet; Take 1 tablet  (12.5 mg total) by mouth every 8 (eight) hours as needed for nausea or vomiting.  Dispense: 20 tablet; Refill: 0  2. Dizziness - promethazine (PHENERGAN) 12.5 MG tablet; Take 1 tablet (12.5 mg total) by mouth every 8 (eight) hours as needed for nausea or vomiting.  Dispense: 20 tablet; Refill: 0  Consulted with Dr. Allen Norris noted low suspicion for symptoms being GI related. Will further discuss with patient.

## 2018-09-03 NOTE — Patient Instructions (Signed)
-   take phenergan for nausea - If unrelieved stop taking this medication and let us know so we can switch you to meclizine for your dizziness - Continue follow up with GI - GO to ER if having worsening dizziness, falls or worsening balance.

## 2018-09-03 NOTE — Telephone Encounter (Signed)
Copied from [place] [phone]. Topic: Quick Communication - See Telephone Encounter >> Sep 03, 2018  2:06 PM Synthia Innocent wrote: CRM for notification. See Telephone encounter for: 09/03/18. Wife would like to know if patient is to continue with pepcid and antibiotics?

## 2018-09-03 NOTE — Telephone Encounter (Signed)
Pt is calling he is still in a lot of pain and Dr. Allen Norris recommended  To check his gallbladder next

## 2018-09-04 ENCOUNTER — Other Ambulatory Visit: Payer: Self-pay | Admitting: Nurse Practitioner

## 2018-09-04 DIAGNOSIS — R5383 Other fatigue: Secondary | ICD-10-CM

## 2018-09-04 DIAGNOSIS — F329 Major depressive disorder, single episode, unspecified: Secondary | ICD-10-CM

## 2018-09-04 DIAGNOSIS — R110 Nausea: Secondary | ICD-10-CM

## 2018-09-04 DIAGNOSIS — D497 Neoplasm of unspecified behavior of endocrine glands and other parts of nervous system: Secondary | ICD-10-CM

## 2018-09-04 DIAGNOSIS — F32A Depression, unspecified: Secondary | ICD-10-CM

## 2018-09-05 ENCOUNTER — Other Ambulatory Visit: Payer: Self-pay | Admitting: Emergency Medicine

## 2018-09-05 ENCOUNTER — Ambulatory Visit (INDEPENDENT_AMBULATORY_CARE_PROVIDER_SITE_OTHER): Payer: PPO | Admitting: Family Medicine

## 2018-09-05 ENCOUNTER — Encounter: Payer: Self-pay | Admitting: Family Medicine

## 2018-09-05 VITALS — BP 110/72 | HR 95 | Temp 98.5°F | Resp 14 | Ht 70.0 in | Wt 193.1 lb

## 2018-09-05 DIAGNOSIS — R5383 Other fatigue: Secondary | ICD-10-CM

## 2018-09-05 DIAGNOSIS — R112 Nausea with vomiting, unspecified: Secondary | ICD-10-CM

## 2018-09-05 DIAGNOSIS — F32A Depression, unspecified: Secondary | ICD-10-CM

## 2018-09-05 DIAGNOSIS — R739 Hyperglycemia, unspecified: Secondary | ICD-10-CM | POA: Diagnosis not present

## 2018-09-05 DIAGNOSIS — R439 Unspecified disturbances of smell and taste: Secondary | ICD-10-CM | POA: Diagnosis not present

## 2018-09-05 DIAGNOSIS — R432 Parageusia: Secondary | ICD-10-CM | POA: Diagnosis not present

## 2018-09-05 DIAGNOSIS — F329 Major depressive disorder, single episode, unspecified: Secondary | ICD-10-CM | POA: Diagnosis not present

## 2018-09-05 DIAGNOSIS — R634 Abnormal weight loss: Secondary | ICD-10-CM

## 2018-09-05 DIAGNOSIS — R110 Nausea: Secondary | ICD-10-CM

## 2018-09-05 DIAGNOSIS — D497 Neoplasm of unspecified behavior of endocrine glands and other parts of nervous system: Secondary | ICD-10-CM

## 2018-09-05 NOTE — Assessment & Plan Note (Signed)
None of his readings are diabetic range and would not be enough to explain his symptoms

## 2018-09-05 NOTE — Assessment & Plan Note (Addendum)
Ongoing issues for weeks with significant weight loss; we reviewed his history, work-up, labs in depth today; I ordered a morning cortisol which was drawn today and results are pending; his altered sense of taste and smell are noted, and I will ask endo if brain imaging, pituitary work-up indicated; I have sent a note to GI to see if Marinol might be a worthy consideration for his nausea to help him eat; he has a HIDA scan scheduled for Monday and my staff was able to move that up to Saturday

## 2018-09-05 NOTE — Progress Notes (Signed)
BP 110/72   Pulse 95   Temp 98.5 F (36.9 C) (Oral)   Resp 14   Ht 5\' 10"  (1.778 m)   Wt 193 lb 1.6 oz (87.6 kg)   SpO2 98%   BMI 27.71 kg/m    Subjective:    Patient ID: Stephen Stein, male    DOB: 25-Jul-1941, 77 y.o.   MRN: [id number]  HPI: Stephen Stein is a 77 y.o. male  Chief Complaint  Patient presents with  . Weight Loss    onset 5 weeks loss appetie, nausea and dizziness    HPI  Patient is here with fatigue, weight loss, nausea, other symptoms that all started about five weeks ago He has been seen by our staff here, urgent care, ER, and hospital staff, including GI specialist We decided to start from scratch and get the history from the beginning and work forward; multiple notes and labs and xrays were reviewed today as we worked through his course of illness and various tests that have been done  He says that five weeks ago started with burning and tightness in groin; no fevers; that all resolved Went to urgent care, thought he might have had a prostate infection, started an antibiotic, bactrim Stopped the ABX after two days, saw Benjamine Mola here Throwing up and thought dehydrated and went to the ER; Aug 16th No fluids that day; then felt rotten Nausea, dizziness, throwing up, now just dry heaves Dry heaves 4-5 minutes at a time Things trigger that; putting dentures in or out will  Hypersensitive smell now The dogs smell, house smell, things are strong and different Everything tastes very sweet to him now Craving for the past week for home fries, country ham and eggs; took two or three bites and then felt sick most of the evening, greasy pork taste No change in vision Light-headed Manifested itself the worst last Saturday, fell onto the floor; did not hurt himself at all; sense of balance is affected He has noticed a different underarm odor Shuffling gait; unsteady; fall last week; no spinning He has trouble with words occasionally; wife says he has  confusion Patient asked if it was an nonorary fruit stand, instead of honor system He may give delayed responses to questions Lots of yes or no answers He has to scrub it every day to where it smells clean; unusual underarm odor He is itching all over the trunk, usually in the winter time; dry skin now Urinating normally Drinks lots of water but just to avoid dehydration They went to the urgent care over the weekend; Thersa Salt wrote a Rx for erythromycin, but patient only took a few doses, not sure what for The phenergan made him feel awful; restless, felt terrible and not taking any more Dr. Allen Norris tried klonopin for him, for the gagging and throwing up, in case he was just gagging at the thought of eating; that did not help Colonoscopy; no blood in the stool Fatty liver perhaps, seen on scan; never much of a drinker except when 18-20 years Father had myasthenia gravis; patient denies eyelid heaviness; his weakness and fatigue is all over, legs are tired, muscles are tired  Depression screen [facility] 2/9 09/05/2018 09/05/2018 08/15/2018 08/10/2018 06/19/2018  Decreased Interest 0 0 2 0 0  Down, Depressed, Hopeless 1 0 1 0 0  PHQ - 2 Score 1 0 3 0 0  Altered sleeping 0 - 2 - -  Tired, decreased energy 3 - 3 - -  Change in appetite 3 - 3 - -  Feeling bad or failure about yourself  1 - 0 - -  Trouble concentrating 1 - 1 - -  Moving slowly or fidgety/restless 1 - 1 - -  Suicidal thoughts 0 - 0 - -  PHQ-9 Score 10 - 13 - -  Difficult doing work/chores Somewhat difficult - Somewhat difficult - -    Relevant past medical, surgical, family and social history reviewed Past Medical History:  Diagnosis Date  . 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 BPH (benign prostatic hyperplasia)   . Hyperlipidemia   . Hypertension    For irregular heart beat  . Irregular heartbeat    been taking Atenolol for 20 years   Past Surgical History:  Procedure Laterality Date  .  COLONOSCOPY  2008  . ESOPHAGOGASTRODUODENOSCOPY (EGD) WITH PROPOFOL N/A 08/20/2018   Procedure: ESOPHAGOGASTRODUODENOSCOPY (EGD) WITH PROPOFOL;  Surgeon: Lucilla Lame, MD;  Location: [facility] ENDOSCOPY;  Service: Endoscopy;  Laterality: N/A;  . TONSILLECTOMY AND ADENOIDECTOMY  1955   Family History  Problem Relation Age of Onset  . Cancer Mother        lung  . Heart disease Father   . Hearing loss Sister        coclear implant  . Hearing loss Brother   . Cancer Paternal Grandmother   . Cancer Son        colon cancer  . Colon cancer Neg Hx   . Stomach cancer Neg Hx    Social History   Tobacco Use  . Smoking status: Former Smoker    Last attempt to quit: 06/22/1992    Years since quitting: 26.2  . Smokeless tobacco: Former Systems developer    Quit date: 06/22/1992  Substance Use Topics  . Alcohol use: Yes    Alcohol/week: 1.0 standard drinks    Types: 1 Cans of beer per week    Comment: occasional  . Drug use: No    Interim medical history since last visit reviewed. Allergies and medications reviewed  Review of Systems Per HPI unless specifically indicated above     Objective:    BP 110/72   Pulse 95   Temp 98.5 F (36.9 C) (Oral)   Resp 14   Ht 5\' 10"  (1.778 m)   Wt 193 lb 1.6 oz (87.6 kg)   SpO2 98%   BMI 27.71 kg/m   Wt Readings from Last 3 Encounters:  09/05/18 193 lb 1.6 oz (87.6 kg)  09/03/18 195 lb (88.5 kg)  09/01/18 197 lb (89.4 kg)  MD note: patient weighed 224 pounds on June 19, 2018 here in the office   Physical Exam  Constitutional: He appears well-developed and well-nourished. No distress.  Significant weight loss noted  HENT:  Head: Normocephalic and atraumatic.  Some deepening on tongue furrows; no leukoplakia along buccal or gingival mucosa  Eyes: EOM are normal. No scleral icterus.  Neck: No thyromegaly present.  Cardiovascular: Normal rate and regular rhythm.  Pulmonary/Chest: Effort normal and breath sounds normal.  Abdominal: Soft. Bowel sounds  are normal. He exhibits no distension. There is no tenderness. A hernia (small umbilical hernia, easily reducible) is present.  Musculoskeletal: He exhibits no edema.  Neurological: Coordination normal.  Skin: Skin is warm and dry. He is not diaphoretic. No pallor.  Skin is wrinkled with decreased turgor; dry skin; some erythema and mild lichenification on the upper abdomen and chest  Psychiatric: He has  a normal mood and affect. His behavior is normal. Judgment and thought content normal.    Results for orders placed or performed during the hospital encounter of 08/18/18  KOH prep  Result Value Ref Range   Specimen Description ESOPHAGUS    Special Requests Normal    KOH Prep      BUDDING YEAST SEEN Performed at [facility], [address] [place],  [id number]    Report Status 08/20/2018 FINAL   CBC  Result Value Ref Range   WBC 9.3 3.8 - 10.6 K/uL   RBC 4.88 4.40 - 5.90 MIL/uL   Hemoglobin 16.1 13.0 - 18.0 g/dL   HCT 44.7 40.0 - 52.0 %   MCV 91.7 80.0 - 100.0 fL   MCH 33.1 26.0 - 34.0 pg   MCHC 36.1 (H) 32.0 - 36.0 g/dL   RDW 13.3 11.5 - 14.5 %   Platelets 208 150 - 440 K/uL  TSH  Result Value Ref Range   TSH 1.812 0.350 - 4.500 uIU/mL  Comprehensive metabolic panel  Result Value Ref Range   Sodium 137 135 - 145 mmol/L   Potassium 3.6 3.5 - 5.1 mmol/L   Chloride 104 98 - 111 mmol/L   CO2 22 22 - 32 mmol/L   Glucose, Bld 113 (H) 70 - 99 mg/dL   BUN 15 8 - 23 mg/dL   Creatinine, Ser 1.00 0.61 - 1.24 mg/dL   Calcium 9.5 8.9 - 10.3 mg/dL   Total Protein 7.2 6.5 - 8.1 g/dL   Albumin 4.1 3.5 - 5.0 g/dL   AST 24 15 - 41 U/L   ALT 28 0 - 44 U/L   Alkaline Phosphatase 23 (L) 38 - 126 U/L   Total Bilirubin 0.8 0.3 - 1.2 mg/dL   GFR calc non Af Amer >60 >60 mL/min   GFR calc Af Amer >60 >60 mL/min   Anion gap 11 5 - 15  Lipase, blood  Result Value Ref Range   Lipase 30 11 - 51 U/L  CBC  Result Value Ref Range   WBC 8.0 3.8 - 10.6 K/uL   RBC 4.52 4.40 -  5.90 MIL/uL   Hemoglobin 14.8 13.0 - 18.0 g/dL   HCT 41.6 40.0 - 52.0 %   MCV 92.0 80.0 - 100.0 fL   MCH 32.8 26.0 - 34.0 pg   MCHC 35.7 32.0 - 36.0 g/dL   RDW 13.0 11.5 - 14.5 %   Platelets 190 150 - 440 K/uL  Basic metabolic panel  Result Value Ref Range   Sodium 137 135 - 145 mmol/L   Potassium 3.6 3.5 - 5.1 mmol/L   Chloride 107 98 - 111 mmol/L   CO2 23 22 - 32 mmol/L   Glucose, Bld 112 (H) 70 - 99 mg/dL   BUN 13 8 - 23 mg/dL   Creatinine, Ser 0.90 0.61 - 1.24 mg/dL   Calcium 8.9 8.9 - 10.3 mg/dL   GFR calc non Af Amer >60 >60 mL/min   GFR calc Af Amer >60 >60 mL/min   Anion gap 7 5 - 15      Assessment & Plan:   Problem List Items Addressed This Visit      Digestive   Nausea and vomiting    Ongoing issues for weeks with significant weight loss; we reviewed his history, work-up, labs in depth today; I ordered a morning cortisol which was drawn today and results are pending; his altered sense of taste and smell are noted, and  I will ask endo if brain imaging, pituitary work-up indicated; I have sent a note to GI to see if Marinol might be a worthy consideration for his nausea to help him eat; he has a HIDA scan scheduled for Monday and my staff was able to move that up to Saturday        Other   Hyperglycemia    None of his readings are diabetic range and would not be enough to explain his symptoms       Other Visit Diagnoses    Weight loss    -  Primary   unintentional weight loss with a host of other symptoms; coordinating care with endo, GI, consider marinol; discussed ddx   Sense of smell altered       noted, consider brain imaging; seeing endo on Friday   Taste sense altered       noted, consider additional brain imaging; head CT already done; seeing endo on Friday      Follow up plan: No follow-ups on file.  An after-visit summary was printed and given to the patient at [place].  Please see the patient instructions which may contain other information and  recommendations beyond what is mentioned above in the assessment and plan.  No orders of the defined types were placed in this encounter.   No orders of the defined types were placed in this encounter. Notes: Try to move up the HIDA scan to tomorrow;  Communicate with Dr. Gabriel Carina about current sx; see about other tests Trial of hydrocortisone; ask about brain imaging; will let Dr. Gabriel Carina see patient on Friday and she can review Ask Wohl about marinol  Face-to-face time with patient was more than 40 minutes, >50% time spent counseling and coordination of care

## 2018-09-05 NOTE — Patient Instructions (Signed)
We will be in touch

## 2018-09-06 ENCOUNTER — Telehealth: Payer: Self-pay | Admitting: Family Medicine

## 2018-09-06 ENCOUNTER — Other Ambulatory Visit: Payer: Self-pay | Admitting: Nurse Practitioner

## 2018-09-06 DIAGNOSIS — R112 Nausea with vomiting, unspecified: Secondary | ICD-10-CM

## 2018-09-06 MED ORDER — DRONABINOL 2.5 MG PO CAPS: 2.5000 mg | ORAL_CAPSULE | Freq: Two times a day (BID) | ORAL | 0 refills | Status: DC

## 2018-09-06 NOTE — Telephone Encounter (Signed)
Please let patient know that I've sent in a prescription for Marinol, the medicine we discussed at his appointment; I hope this helps, but do let us know about any side effects or lack of efficacy

## 2018-09-07 DIAGNOSIS — E279 Disorder of adrenal gland, unspecified: Secondary | ICD-10-CM | POA: Diagnosis not present

## 2018-09-07 DIAGNOSIS — R110 Nausea: Secondary | ICD-10-CM | POA: Diagnosis not present

## 2018-09-07 DIAGNOSIS — R634 Abnormal weight loss: Secondary | ICD-10-CM | POA: Diagnosis not present

## 2018-09-07 LAB — COMPLETE METABOLIC PANEL WITH GFR
AG Ratio: 1.5 (calc) (ref 1.0–2.5)
ALT: 21 U/L (ref 9–46)
AST: 20 U/L (ref 10–35)
Albumin: 4 g/dL (ref 3.6–5.1)
Alkaline phosphatase (APISO): 25 U/L — ABNORMAL LOW (ref 40–115)
BUN: 10 mg/dL (ref 7–25)
CALCIUM: 9.9 mg/dL (ref 8.6–10.3)
CO2: 25 mmol/L (ref 20–32)
CREATININE: 1.13 mg/dL (ref 0.70–1.18)
Chloride: 97 mmol/L — ABNORMAL LOW (ref 98–110)
GFR, EST AFRICAN AMERICAN: 73 mL/min/{1.73_m2} (ref >=60)
GFR, Est Non African American: 63 mL/min/{1.73_m2} (ref >=60)
GLUCOSE: 90 mg/dL (ref 65–99)
Globulin: 2.6 g/dL (calc) (ref 1.9–3.7)
Potassium: 3.7 mmol/L (ref 3.5–5.3)
Sodium: 136 mmol/L (ref 135–146)
Total Bilirubin: 0.6 mg/dL (ref 0.2–1.2)
Total Protein: 6.6 g/dL (ref 6.1–8.1)

## 2018-09-07 LAB — ACTH: C206 ACTH: <5 pg/mL — ABNORMAL LOW (ref 6–50)

## 2018-09-07 LAB — CORTISOL: CORTISOL PLASMA: 21.1 ug/dL

## 2018-09-07 NOTE — Telephone Encounter (Signed)
Left detailed voicemail

## 2018-09-08 ENCOUNTER — Encounter
Admission: RE | Admit: 2018-09-08 | Discharge: 2018-09-08 | Disposition: A | Discharge status: Home / Self Care | Payer: PPO | Source: Ambulatory Visit | Attending: Gastroenterology | Admitting: Gastroenterology

## 2018-09-08 DIAGNOSIS — R112 Nausea with vomiting, unspecified: Secondary | ICD-10-CM | POA: Insufficient documentation

## 2018-09-08 MED ORDER — TECHNETIUM TC 99M MEBROFENIN IV KIT
5.4600 | PACK | Freq: Once | INTRAVENOUS | Status: AC | PRN
  Administered 2018-09-08: 5.46 via INTRAVENOUS

## 2018-09-09 ENCOUNTER — Encounter: Payer: Self-pay | Admitting: Family Medicine

## 2018-09-10 ENCOUNTER — Other Ambulatory Visit: Payer: Self-pay

## 2018-09-10 ENCOUNTER — Encounter: Payer: Self-pay | Admitting: Surgery

## 2018-09-10 ENCOUNTER — Ambulatory Visit: Payer: PPO

## 2018-09-10 ENCOUNTER — Ambulatory Visit (INDEPENDENT_AMBULATORY_CARE_PROVIDER_SITE_OTHER): Payer: PPO | Admitting: Surgery

## 2018-09-10 VITALS — BP 112/68 | HR 99 | Temp 97.7°F | Resp 18 | Ht 70.0 in | Wt 190.0 lb

## 2018-09-10 DIAGNOSIS — K828 Other specified diseases of gallbladder: Secondary | ICD-10-CM

## 2018-09-10 DIAGNOSIS — R1115 Cyclical vomiting syndrome unrelated to migraine: Secondary | ICD-10-CM

## 2018-09-10 NOTE — Patient Instructions (Signed)
Please see your blue pre-care sheet for surgery information.    You have requested to have your gallbladder removed. This will be done  at [facility] with Dr. Dahlia Byes. We will call you with the date.  You will most likely be out of work 1-2 weeks for this surgery. You will return after your post-op appointment with a lifting restriction for approximately 4 more weeks.  You will be able to eat anything you would like to following surgery. But, start by eating a bland diet and advance this as tolerated. The Gallbladder diet is below, please go as closely by this diet as possible prior to surgery to avoid any further attacks.  Please see the (blue)pre-care form that you have been given today. If you have any questions, please call our office.  Laparoscopic Cholecystectomy Laparoscopic cholecystectomy is surgery to remove the gallbladder. The gallbladder is located in the upper right part of the abdomen, behind the liver. It is a storage sac for bile, which is produced in the liver. Bile aids in the digestion and absorption of fats. Cholecystectomy is often done for inflammation of the gallbladder (cholecystitis). This condition is usually caused by a buildup of gallstones (cholelithiasis) in the gallbladder. Gallstones can block the flow of bile, and that can result in inflammation and pain. In severe cases, emergency surgery may be required. If emergency surgery is not required, you will have time to prepare for the procedure. Laparoscopic surgery is an alternative to open surgery. Laparoscopic surgery has a shorter recovery time. Your common bile duct may also need to be examined during the procedure. If stones are found in the common bile duct, they may be removed. LET [facility] CARE PROVIDER KNOW ABOUT:  Any allergies you have.  All medicines you are taking, including vitamins, herbs, eye drops, creams, and over-the-counter medicines.  Previous problems you or members of your family have  had with the use of anesthetics.  Any blood disorders you have.  Previous surgeries you have had.    Any medical conditions you have. RISKS AND COMPLICATIONS Generally, this is a safe procedure. However, problems may occur, including:  Infection.  Bleeding.  Allergic reactions to medicines.  Damage to other structures or organs.  A stone remaining in the common bile duct.  A bile leak from the cyst duct that is clipped when your gallbladder is removed.  The need to convert to open surgery, which requires a larger incision in the abdomen. This may be necessary if your surgeon thinks that it is not safe to continue with a laparoscopic procedure. BEFORE THE PROCEDURE  Ask your health care provider about:  Changing or stopping your regular medicines. This is especially important if you are taking diabetes medicines or blood thinners.  Taking medicines such as aspirin and ibuprofen. These medicines can thin your blood. Do not take these medicines before your procedure if your health care provider instructs you not to.  Follow instructions from your health care provider about eating or drinking restrictions.  Let your health care provider know if you develop a cold or an infection before surgery.  Plan to have someone take you home after the procedure.  Ask your health care provider how your surgical site will be marked or identified.  You may be given antibiotic medicine to help prevent infection. PROCEDURE  To reduce your risk of infection:  Your health care team will wash or sanitize their hands.  Your skin will be washed with soap.  An IV  tube may be inserted into one of your veins.  You will be given a medicine to make you fall asleep (general anesthetic).  A breathing tube will be placed in your mouth.  The surgeon will make several small cuts (incisions) in your abdomen.  A thin, lighted tube (laparoscope) that has a tiny camera on the end will be inserted  through one of the small incisions. The camera on the laparoscope will send a picture to a TV screen (monitor) in the operating room. This will give the surgeon a good view inside your abdomen.  A gas will be pumped into your abdomen. This will expand your abdomen to give the surgeon more room to perform the surgery.  Other tools that are needed for the procedure will be inserted through the other incisions. The gallbladder will be removed through one of the incisions.  After your gallbladder has been removed, the incisions will be closed with stitches (sutures), staples, or skin glue.  Your incisions may be covered with a bandage (dressing). The procedure may vary among health care providers and hospitals. AFTER THE PROCEDURE  Your blood pressure, heart rate, breathing rate, and blood oxygen level will be monitored often until the medicines you were given have worn off.  You will be given medicines as needed to control your pain.   This information is not intended to replace advice given to you by your health care provider. Make sure you discuss any questions you have with your health care provider.   Document Released: 12/12/2005 Document Revised: 09/02/2015 Document Reviewed: 07/24/2013 Elsevier Interactive Patient Education 2016 [place] Diet for Gallbladder Conditions A low-fat diet can be helpful if you have pancreatitis or a gallbladder condition. With these conditions, your pancreas and gallbladder have trouble digesting fats. A healthy eating plan with less fat will help rest your pancreas and gallbladder and reduce your symptoms. WHAT DO I NEED TO KNOW ABOUT THIS DIET?  Eat a low-fat diet.  Reduce your fat intake to less than 20-30% of your total daily calories. This is less than 50-60 g of fat per day.  Remember that you need some fat in your diet. Ask your dietician what your daily goal should be.  Choose nonfat and low-fat healthy foods. Look for the words  "nonfat," "low fat," or "fat free."  As a guide, look on the label and choose foods with less than 3 g of fat per serving. Eat only one serving.  Avoid alcohol.  Do not smoke. If you need help quitting, talk with your health care provider.  Eat small frequent meals instead of three large heavy meals. WHAT FOODS CAN I EAT? Grains Include healthy grains and starches such as potatoes, wheat bread, fiber-rich cereal, and brown rice. Choose whole grain options whenever possible. In adults, whole grains should account for 45-65% of your daily calories.  Fruits and Vegetables Eat plenty of fruits and vegetables. Fresh fruits and vegetables add fiber to your diet. Meats and Other Protein Sources Eat lean meat such as chicken and pork. Trim any fat off of meat before cooking it. Eggs, fish, and beans are other sources of protein. In adults, these foods should account for 10-35% of your daily calories. Dairy Choose low-fat milk and dairy options. Dairy includes fat and protein, as well as calcium.  Fats and Oils Limit high-fat foods such as fried foods, sweets, baked goods, sugary drinks.  Other Creamy sauces and condiments, such as mayonnaise, can add extra fat.  Think about whether or not you need to use them, or use smaller amounts or low fat options. WHAT FOODS ARE NOT RECOMMENDED?  High fat foods, such as:  [employer].  Ice cream.  [place] toast.  Sweet rolls.  Pizza.  Cheese bread.  Foods covered with batter, butter, creamy sauces, or cheese.  Fried foods.  Sugary drinks and desserts.  Foods that cause gas or bloating   This information is not intended to replace advice given to you by your health care provider. Make sure you discuss any questions you have with your health care provider.   Document Released: 12/17/2013 Document Reviewed: 12/17/2013 Elsevier Interactive Patient Education [employer].

## 2018-09-11 ENCOUNTER — Other Ambulatory Visit: Payer: Self-pay | Admitting: Nurse Practitioner

## 2018-09-11 ENCOUNTER — Telehealth: Payer: Self-pay | Admitting: Nurse Practitioner

## 2018-09-11 ENCOUNTER — Encounter: Payer: Self-pay | Admitting: Surgery

## 2018-09-11 ENCOUNTER — Telehealth: Payer: Self-pay | Admitting: Surgery

## 2018-09-11 MED ORDER — TRAZODONE HCL 50 MG PO TABS: ORAL_TABLET | ORAL | 0 refills | Status: DC

## 2018-09-11 NOTE — H&P (View-Only) (Signed)
Patient ID: Stephen Stein, male   DOB: June 30, 1941, 77 y.o.   MRN: [id number]  HPI Stephen Stein is a 77 y.o. male seen in consultation at the request of Dr. Allen Norris. Comes for persistent Nausea and abdominal discomfort for several weeks . Ports also approximately 30 pounds lost.  He experiences intermittent nausea and vomiting.  Specific alleviating or aggravating factors. He has had extensive work-up including a CT scan of the abdomen and pelvis, EGD and ultrasound that I have all reviewed personally.  These are essentially unremarkable. He subsequently had a HIDA scan showing 7% ejection fraction.  Patient also reported replication of symptoms after HIDA scan. Reports some balance issues and some dizziness.  He is able to perform more than 4 METS of activity without any shortness of breath or chest pain. BMP and BMP completely normal.  No evidence of biliary dilation.  HPI  Past Medical History:  Diagnosis Date  . 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 BPH (benign prostatic hyperplasia)   . Hyperlipidemia   . Hypertension    For irregular heart beat  . Irregular heartbeat    been taking Atenolol for 20 years    Past Surgical History:  Procedure Laterality Date  . COLONOSCOPY  2008  . ESOPHAGOGASTRODUODENOSCOPY (EGD) WITH PROPOFOL N/A 08/20/2018   Procedure: ESOPHAGOGASTRODUODENOSCOPY (EGD) WITH PROPOFOL;  Surgeon: Lucilla Lame, MD;  Location: [facility] ENDOSCOPY;  Service: Endoscopy;  Laterality: N/A;  . TONSILLECTOMY AND ADENOIDECTOMY  1955    Family History  Problem Relation Age of Onset  . Cancer Mother        lung  . Heart disease Father   . Hearing loss Sister        coclear implant  . Hearing loss Brother   . Cancer Paternal Grandmother   . Cancer Son        colon cancer  . Colon cancer Neg Hx   . Stomach cancer Neg Hx     Social History Social History   Tobacco Use  . Smoking status: Former Smoker    Last attempt to quit: 06/22/1992   Years since quitting: 26.2  . Smokeless tobacco: Former Systems developer    Quit date: 06/22/1992  Substance Use Topics  . Alcohol use: Yes    Alcohol/week: 1.0 standard drinks    Types: 1 Cans of beer per week    Comment: occasional  . Drug use: No    No Known Allergies  Current Outpatient Medications  Medication Sig Dispense Refill  . clonazePAM (KLONOPIN) 0.5 MG tablet Take by mouth.    . esomeprazole (NEXIUM) 40 MG capsule Take by mouth.    . traZODone (DESYREL) 50 MG tablet Start with 1/2 tab can take another 1/2 if needed. 30 tablet 0   No current facility-administered medications for this visit.      Review of Systems Full ROS  was asked and was negative except for the information on the HPI  Physical Exam Blood pressure 112/68, pulse 99, temperature 97.7 F (36.5 C), temperature source Temporal, resp. rate 18, height 5\' 10"  (1.778 m), weight 190 lb (86.2 kg). CONSTITUTIONAL: NAD EYES: Pupils are equal, round, and reactive to light, Sclera are non-icteric. EARS, NOSE, MOUTH AND THROAT: The oropharynx is clear. The oral mucosa is pink and moist. Hearing is intact to voice. LYMPH NODES:  Lymph nodes in the neck are normal. RESPIRATORY:  Lungs are clear. There is normal respiratory effort, with equal  breath sounds bilaterally, and without pathologic use of accessory muscles. CARDIOVASCULAR: Heart is regular without murmurs, gallops, or rubs. GI: The abdomen is soft, nontender, and nondistended. There are no palpable masses. There is no hepatosplenomegaly. There are normal bowel sounds in all quadrants. GU: Rectal deferred.   MUSCULOSKELETAL: Normal muscle strength and tone. No cyanosis or edema.   SKIN: Turgor is good and there are no pathologic skin lesions or ulcers. NEUROLOGIC: Motor and sensation is grossly normal. Cranial nerves are grossly intact. PSYCH:  Oriented to person, place and time. Affect is normal.  Data Reviewed  I have personally reviewed the patient's imaging,  laboratory findings and medical records.    Assessment/Plan 77 year old with some abdominal discomfort and persistent nausea consistent with biliary dyskinesia.  HIDA scan confirm ejection fraction of the gallbladder of only 7%.  Extensive discussion with the patient and family about biliary dyskinesia and expected outcomes after laparoscopic cholecystectomy.  I specifically remind him that some of these patients will not have complete improvement of symptoms however I do think that it is indicated at this time since there is no other pathology that has been identified. The risks, benefits, complications, treatment options, and expected outcomes were discussed with the patient. The possibilities of bleeding, recurrent infection, finding a normal gallbladder, perforation of viscus organs, damage to surrounding structures, bile leak, abscess formation, needing a drain placed, the need for additional procedures, reaction to medication, pulmonary aspiration,  failure to diagnose a condition, the possible need to convert to an open procedure, and creating a complication requiring transfusion or operation were discussed with the patient. The patient and/or family concurred with the proposed plan, giving informed consent.   Caroleen Hamman, MD FACS General Surgeon 09/11/2018, 10:31 AM

## 2018-09-11 NOTE — Telephone Encounter (Signed)
Patient presented to clinic requesting something to help him sleep; states the last few nights have tried benadryl with no relief and then klonopin that does not make him drowsy. Trazodone sent to pharmacy. Please do not use benadryl or klonopin when taking this medicine.

## 2018-09-11 NOTE — Progress Notes (Signed)
Patient ID: Stephen Stein, male   DOB: [birth year], 77 y.o.   MRN: [id number]  HPI [name] is a 77 y.o. male seen in consultation at the request of Dr. Allen Stein. Comes for persistent Nausea and abdominal discomfort for several weeks . Ports also approximately 30 pounds lost.  He experiences intermittent nausea and vomiting.  Specific alleviating or aggravating factors. He has had extensive work-up including a CT scan of the abdomen and pelvis, EGD and ultrasound that I have all reviewed personally.  These are essentially unremarkable. He subsequently had a HIDA scan showing 7% ejection fraction.  Patient also reported replication of symptoms after HIDA scan. Reports some balance issues and some dizziness.  He is able to perform more than 4 METS of activity without any shortness of breath or chest pain. BMP and BMP completely normal.  No evidence of biliary dilation.  HPI  Past Medical History:  Diagnosis Date  . 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 BPH (benign prostatic hyperplasia)   . Hyperlipidemia   . Hypertension    For irregular heart beat  . Irregular heartbeat    been taking Atenolol for 20 years    Past Surgical History:  Procedure Laterality Date  . COLONOSCOPY  2008  . ESOPHAGOGASTRODUODENOSCOPY (EGD) WITH PROPOFOL N/A 08/20/2018   Procedure: ESOPHAGOGASTRODUODENOSCOPY (EGD) WITH PROPOFOL;  Surgeon: Lucilla Lame, MD;  Location: [facility] ENDOSCOPY;  Service: Endoscopy;  Laterality: N/A;  . TONSILLECTOMY AND ADENOIDECTOMY  1955    Family History  Problem Relation Age of Onset  . Cancer Mother        lung  . Heart disease Father   . Hearing loss Sister        coclear implant  . Hearing loss Brother   . Cancer Paternal Grandmother   . Cancer Son        colon cancer  . Colon cancer Neg Hx   . Stomach cancer Neg Hx     Social History Social History   Tobacco Use  . Smoking status: Former Smoker    Last attempt to quit: 06/22/1992   Years since quitting: 26.2  . Smokeless tobacco: Former Systems developer    Quit date: 06/22/1992  Substance Use Topics  . Alcohol use: Yes    Alcohol/week: 1.0 standard drinks    Types: 1 Cans of beer per week    Comment: occasional  . Drug use: No    No Known Allergies  Current Outpatient Medications  Medication Sig Dispense Refill  . clonazePAM (KLONOPIN) 0.5 MG tablet Take by mouth.    . esomeprazole (NEXIUM) 40 MG capsule Take by mouth.    . traZODone (DESYREL) 50 MG tablet Start with 1/2 tab can take another 1/2 if needed. 30 tablet 0   No current facility-administered medications for this visit.      Review of Systems Full ROS  was asked and was negative except for the information on the HPI  Physical Exam Blood pressure 112/68, pulse 99, temperature 97.7 F (36.5 C), temperature source Temporal, resp. rate 18, height 5\' 10"  (1.778 m), weight 190 lb (86.2 kg). CONSTITUTIONAL: NAD EYES: Pupils are equal, round, and reactive to light, Sclera are non-icteric. EARS, NOSE, MOUTH AND THROAT: The oropharynx is clear. The oral mucosa is pink and moist. Hearing is intact to voice. LYMPH NODES:  Lymph nodes in the neck are normal. RESPIRATORY:  Lungs are clear. There is normal respiratory effort, with equal  breath sounds bilaterally, and without pathologic use of accessory muscles. CARDIOVASCULAR: Heart is regular without murmurs, gallops, or rubs. GI: The abdomen is soft, nontender, and nondistended. There are no palpable masses. There is no hepatosplenomegaly. There are normal bowel sounds in all quadrants. GU: Rectal deferred.   MUSCULOSKELETAL: Normal muscle strength and tone. No cyanosis or edema.   SKIN: Turgor is good and there are no pathologic skin lesions or ulcers. NEUROLOGIC: Motor and sensation is grossly normal. Cranial nerves are grossly intact. PSYCH:  Oriented to person, place and time. Affect is normal.  Data Reviewed  I have personally reviewed the patient's imaging,  laboratory findings and medical records.    Assessment/Plan 77 year old with some abdominal discomfort and persistent nausea consistent with biliary dyskinesia.  HIDA scan confirm ejection fraction of the gallbladder of only 7%.  Extensive discussion with the patient and family about biliary dyskinesia and expected outcomes after laparoscopic cholecystectomy.  I specifically remind him that some of these patients will not have complete improvement of symptoms however I do think that it is indicated at this time since there is no other pathology that has been identified. The risks, benefits, complications, treatment options, and expected outcomes were discussed with the patient. The possibilities of bleeding, recurrent infection, finding a normal gallbladder, perforation of viscus organs, damage to surrounding structures, bile leak, abscess formation, needing a drain placed, the need for additional procedures, reaction to medication, pulmonary aspiration,  failure to diagnose a condition, the possible need to convert to an open procedure, and creating a complication requiring transfusion or operation were discussed with the patient. The patient and/or family concurred with the proposed plan, giving informed consent.   Caroleen Hamman, MD FACS General Surgeon 09/11/2018, 10:31 AM

## 2018-09-11 NOTE — Telephone Encounter (Signed)
Patient and wife came by the office this morning prior to visiting patient's PCP-Dr [place]. Patient's wife was very emotional due to the ongoing pain that she states that her husband has been in with his gallbladder. I was able to provide a surgery date for the patient to meet the patient and wife's needs.  All information was discussed below and directions understood.   Pt advised of sx date and arrival time.  Sx: 09/13/18 with Dr Pabon-Laparoscopic cholecystectomy.  Pre op: Patient will arrive at 9:00am the day of surgery-[facility]-Registration desk.   Patient has been advised to be NPO after midnight prior to surgery.

## 2018-09-12 ENCOUNTER — Telehealth: Payer: Self-pay | Admitting: Family Medicine

## 2018-09-12 ENCOUNTER — Ambulatory Visit: Payer: PPO | Admitting: Nurse Practitioner

## 2018-09-12 MED ORDER — CEFAZOLIN SODIUM-DEXTROSE 2-4 GM/100ML-% IV SOLN
2.0000 g | INTRAVENOUS | Status: AC
  Administered 2018-09-13: 2 g via INTRAVENOUS

## 2018-09-12 NOTE — Telephone Encounter (Signed)
Copied from [place]. Topic: Quick Communication - Rx Refill/Question >> Sep 12, 2018  1:31 PM Burchel, Abbi R wrote: Medication: dronabinol (MARINOL) 2.5 MG   Harriet ([facility]) needs to verify some clinical information regarding the purposes this medication was prescribed for [place].  Please advise.  [phone] Choose option 0 Choose option 3 Choose option 3 again  Reference ID #: [id number]

## 2018-09-12 NOTE — Telephone Encounter (Signed)
I filled out the paperwork instead

## 2018-09-13 ENCOUNTER — Ambulatory Visit: Payer: PPO | Admitting: Anesthesiology

## 2018-09-13 ENCOUNTER — Other Ambulatory Visit: Payer: Self-pay

## 2018-09-13 ENCOUNTER — Encounter: Payer: Self-pay | Admitting: *Deleted

## 2018-09-13 ENCOUNTER — Encounter
Admission: RE | Disposition: A | Discharge status: Home / Self Care | Payer: Self-pay | Source: Ambulatory Visit | Attending: Surgery

## 2018-09-13 ENCOUNTER — Ambulatory Visit
Admission: RE | Admit: 2018-09-13 | Discharge: 2018-09-13 | Disposition: A | Discharge status: Home / Self Care | Payer: PPO | Source: Ambulatory Visit | Attending: Surgery | Admitting: Surgery

## 2018-09-13 DIAGNOSIS — R110 Nausea: Secondary | ICD-10-CM | POA: Diagnosis present

## 2018-09-13 DIAGNOSIS — I2510 Atherosclerotic heart disease of native coronary artery without angina pectoris: Secondary | ICD-10-CM | POA: Insufficient documentation

## 2018-09-13 DIAGNOSIS — Z87891 Personal history of nicotine dependence: Secondary | ICD-10-CM | POA: Insufficient documentation

## 2018-09-13 DIAGNOSIS — K828 Other specified diseases of gallbladder: Secondary | ICD-10-CM | POA: Diagnosis not present

## 2018-09-13 DIAGNOSIS — I10 Essential (primary) hypertension: Secondary | ICD-10-CM | POA: Diagnosis not present

## 2018-09-13 DIAGNOSIS — K811 Chronic cholecystitis: Secondary | ICD-10-CM | POA: Insufficient documentation

## 2018-09-13 DIAGNOSIS — Z79899 Other long term (current) drug therapy: Secondary | ICD-10-CM | POA: Insufficient documentation

## 2018-09-13 HISTORY — PX: CHOLECYSTECTOMY: SHX55

## 2018-09-13 SURGERY — LAPAROSCOPIC CHOLECYSTECTOMY
Anesthesia: General

## 2018-09-13 MED ORDER — HYDROCODONE-ACETAMINOPHEN 5-325 MG PO TABS: 1.0000 | ORAL_TABLET | ORAL | 0 refills | Status: DC | PRN

## 2018-09-13 MED ORDER — ACETAMINOPHEN 500 MG PO TABS
ORAL_TABLET | ORAL | Status: AC
  Filled 2018-09-13: qty 2

## 2018-09-13 MED ORDER — ONDANSETRON HCL 4 MG/2ML IJ SOLN: 4.0000 mg | Freq: Once | INTRAMUSCULAR | Status: DC | PRN

## 2018-09-13 MED ORDER — GABAPENTIN 300 MG PO CAPS
ORAL_CAPSULE | ORAL | Status: AC
  Filled 2018-09-13: qty 1

## 2018-09-13 MED ORDER — ROCURONIUM BROMIDE 100 MG/10ML IV SOLN
INTRAVENOUS | Status: DC | PRN
  Administered 2018-09-13: 35 mg via INTRAVENOUS

## 2018-09-13 MED ORDER — HYDROMORPHONE HCL 1 MG/ML IJ SOLN
INTRAMUSCULAR | Status: AC
  Filled 2018-09-13: qty 1

## 2018-09-13 MED ORDER — KETOROLAC TROMETHAMINE 30 MG/ML IJ SOLN
INTRAMUSCULAR | Status: DC | PRN
  Administered 2018-09-13: 15 mg via INTRAVENOUS

## 2018-09-13 MED ORDER — CHLORHEXIDINE GLUCONATE CLOTH 2 % EX PADS
6.0000 | MEDICATED_PAD | Freq: Once | CUTANEOUS | Status: AC
  Administered 2018-09-13: 6 via TOPICAL

## 2018-09-13 MED ORDER — CEFAZOLIN SODIUM-DEXTROSE 2-4 GM/100ML-% IV SOLN
INTRAVENOUS | Status: AC
  Filled 2018-09-13: qty 100

## 2018-09-13 MED ORDER — ONDANSETRON HCL 4 MG/2ML IJ SOLN
INTRAMUSCULAR | Status: AC
  Filled 2018-09-13: qty 2

## 2018-09-13 MED ORDER — CELECOXIB 200 MG PO CAPS
ORAL_CAPSULE | ORAL | Status: AC
  Filled 2018-09-13: qty 1

## 2018-09-13 MED ORDER — DEXAMETHASONE SODIUM PHOSPHATE 10 MG/ML IJ SOLN
INTRAMUSCULAR | Status: DC | PRN
  Administered 2018-09-13: 5 mg via INTRAVENOUS

## 2018-09-13 MED ORDER — HYDROMORPHONE HCL 1 MG/ML IJ SOLN
INTRAMUSCULAR | Status: DC | PRN
  Administered 2018-09-13: 0.5 mg via INTRAVENOUS

## 2018-09-13 MED ORDER — PROPOFOL 10 MG/ML IV BOLUS
INTRAVENOUS | Status: DC | PRN
  Administered 2018-09-13 (×2): 50 mg via INTRAVENOUS
  Administered 2018-09-13: 100 mg via INTRAVENOUS

## 2018-09-13 MED ORDER — PROPOFOL 10 MG/ML IV BOLUS
INTRAVENOUS | Status: AC
  Filled 2018-09-13: qty 20

## 2018-09-13 MED ORDER — SUGAMMADEX SODIUM 200 MG/2ML IV SOLN
INTRAVENOUS | Status: DC | PRN
  Administered 2018-09-13: 200 mg via INTRAVENOUS

## 2018-09-13 MED ORDER — FENTANYL CITRATE (PF) 100 MCG/2ML IJ SOLN
25.0000 ug | INTRAMUSCULAR | Status: AC | PRN
  Administered 2018-09-13 (×6): 25 ug via INTRAVENOUS

## 2018-09-13 MED ORDER — FENTANYL CITRATE (PF) 250 MCG/5ML IJ SOLN
INTRAMUSCULAR | Status: AC
  Filled 2018-09-13: qty 5

## 2018-09-13 MED ORDER — ONDANSETRON HCL 4 MG/2ML IJ SOLN
INTRAMUSCULAR | Status: DC | PRN
  Administered 2018-09-13: 4 mg via INTRAVENOUS

## 2018-09-13 MED ORDER — MIDAZOLAM HCL 2 MG/2ML IJ SOLN
INTRAMUSCULAR | Status: DC | PRN
  Administered 2018-09-13: 2 mg via INTRAVENOUS

## 2018-09-13 MED ORDER — FENTANYL CITRATE (PF) 100 MCG/2ML IJ SOLN
INTRAMUSCULAR | Status: DC | PRN
  Administered 2018-09-13 (×3): 50 ug via INTRAVENOUS

## 2018-09-13 MED ORDER — LACTATED RINGERS IV SOLN
INTRAVENOUS | Status: DC | PRN
  Administered 2018-09-13: 13:00:00 via INTRAVENOUS

## 2018-09-13 MED ORDER — FENTANYL CITRATE (PF) 100 MCG/2ML IJ SOLN
INTRAMUSCULAR | Status: AC
  Administered 2018-09-13: 25 ug via INTRAVENOUS
  Filled 2018-09-13: qty 2

## 2018-09-13 MED ORDER — CHLORHEXIDINE GLUCONATE CLOTH 2 % EX PADS: 6.0000 | MEDICATED_PAD | Freq: Once | CUTANEOUS | Status: DC

## 2018-09-13 MED ORDER — LIDOCAINE HCL (CARDIAC) PF 100 MG/5ML IV SOSY
PREFILLED_SYRINGE | INTRAVENOUS | Status: DC | PRN
  Administered 2018-09-13: 60 mg via INTRAVENOUS

## 2018-09-13 MED ORDER — BUPIVACAINE-EPINEPHRINE (PF) 0.25% -1:200000 IJ SOLN
INTRAMUSCULAR | Status: AC
  Filled 2018-09-13: qty 30

## 2018-09-13 MED ORDER — GABAPENTIN 300 MG PO CAPS
300.0000 mg | ORAL_CAPSULE | ORAL | Status: AC
  Administered 2018-09-13: 300 mg via ORAL

## 2018-09-13 MED ORDER — MIDAZOLAM HCL 2 MG/2ML IJ SOLN
INTRAMUSCULAR | Status: AC
  Filled 2018-09-13: qty 2

## 2018-09-13 MED ORDER — CELECOXIB 200 MG PO CAPS
200.0000 mg | ORAL_CAPSULE | ORAL | Status: AC
  Administered 2018-09-13: 200 mg via ORAL

## 2018-09-13 MED ORDER — BUPIVACAINE-EPINEPHRINE 0.25% -1:200000 IJ SOLN
INTRAMUSCULAR | Status: DC | PRN
  Administered 2018-09-13: 30 mL

## 2018-09-13 MED ORDER — ACETAMINOPHEN 500 MG PO TABS
1000.0000 mg | ORAL_TABLET | ORAL | Status: AC
  Administered 2018-09-13: 1000 mg via ORAL

## 2018-09-13 SURGICAL SUPPLY — 51 items
ADH SKN CLS APL DERMABOND .7 (GAUZE/BANDAGES/DRESSINGS) ×1
APL SWBSTK 6 STRL LF DISP (MISCELLANEOUS)
APPLICATOR COTTON TIP 6 STRL (MISCELLANEOUS) ×1 IMPLANT
APPLICATOR COTTON TIP 6IN STRL (MISCELLANEOUS)
APPLIER CLIP 5 13 M/L LIGAMAX5 (MISCELLANEOUS) ×2
APR CLP MED LRG 5 ANG JAW (MISCELLANEOUS) ×1
BAG SPEC RTRVL LRG 6X4 10 (ENDOMECHANICALS) ×1
BLADE CLIPPER SURG (BLADE) ×1 IMPLANT
BLADE SURG 15 STRL LF DISP TIS (BLADE) ×1 IMPLANT
BLADE SURG 15 STRL SS (BLADE) ×2
CANISTER SUCT 1200ML W/VALVE (MISCELLANEOUS) ×2 IMPLANT
CHLORAPREP W/TINT 26ML (MISCELLANEOUS) ×2 IMPLANT
CHOLANGIOGRAM CATH TAUT (CATHETERS) IMPLANT
CLEANER CAUTERY TIP 5X5 PAD (MISCELLANEOUS) ×1 IMPLANT
CLIP APPLIE 5 13 M/L LIGAMAX5 (MISCELLANEOUS) ×1 IMPLANT
DECANTER SPIKE VIAL GLASS SM (MISCELLANEOUS) ×2 IMPLANT
DERMABOND ADVANCED (GAUZE/BANDAGES/DRESSINGS) ×1
DERMABOND ADVANCED .7 DNX12 (GAUZE/BANDAGES/DRESSINGS) ×1 IMPLANT
DRAPE C-ARM XRAY 36X54 (DRAPES) ×1 IMPLANT
ELECT CAUTERY BLADE 6.4 (BLADE) ×2 IMPLANT
ELECT REM PT RETURN 9FT ADLT (ELECTROSURGICAL) ×2
ELECTRODE REM PT RTRN 9FT ADLT (ELECTROSURGICAL) ×1 IMPLANT
GLOVE BIO SURGEON STRL SZ7 (GLOVE) ×7 IMPLANT
GOWN STRL REUS W/ TWL LRG LVL3 (GOWN DISPOSABLE) ×3 IMPLANT
GOWN STRL REUS W/TWL LRG LVL3 (GOWN DISPOSABLE) ×6
IRRIGATION STRYKERFLOW (MISCELLANEOUS) ×1 IMPLANT
IRRIGATOR STRYKERFLOW (MISCELLANEOUS) ×2
IV CATH ANGIO 12GX3 LT BLUE (NEEDLE) ×2 IMPLANT
IV NS 1000ML (IV SOLUTION) ×2
IV NS 1000ML BAXH (IV SOLUTION) ×1 IMPLANT
L-HOOK LAP DISP 36CM (ELECTROSURGICAL) ×2
LHOOK LAP DISP 36CM (ELECTROSURGICAL) ×1 IMPLANT
NEEDLE HYPO 22GX1.5 SAFETY (NEEDLE) ×4 IMPLANT
NS IRRIG 1000ML POUR BTL (IV SOLUTION) ×2 IMPLANT
PACK LAP CHOLECYSTECTOMY (MISCELLANEOUS) ×2 IMPLANT
PAD CLEANER CAUTERY TIP 5X5 (MISCELLANEOUS) ×1
PENCIL ELECTRO HAND CTR (MISCELLANEOUS) ×2 IMPLANT
POUCH SPECIMEN RETRIEVAL 10MM (ENDOMECHANICALS) ×2 IMPLANT
SCISSORS METZENBAUM CVD 33 (INSTRUMENTS) ×2 IMPLANT
SLEEVE ENDOPATH XCEL 5M (ENDOMECHANICALS) ×4 IMPLANT
SOL ANTI-FOG 6CC FOG-OUT (MISCELLANEOUS) ×1 IMPLANT
SOL FOG-OUT ANTI-FOG 6CC (MISCELLANEOUS) ×1
SPONGE LAP 18X18 RF (DISPOSABLE) ×2 IMPLANT
STOPCOCK 4 WAY LG BORE MALE ST (IV SETS) IMPLANT
SUT ETHIBOND 0 MO6 C/R (SUTURE) IMPLANT
SUT MNCRL AB 4-0 PS2 18 (SUTURE) ×3 IMPLANT
SUT VICRYL 0 AB UR-6 (SUTURE) ×4 IMPLANT
SYR 20CC LL (SYRINGE) ×2 IMPLANT
TROCAR XCEL BLUNT TIP 100MML (ENDOMECHANICALS) ×2 IMPLANT
TROCAR XCEL NON-BLD 5MMX100MML (ENDOMECHANICALS) ×2 IMPLANT
TUBING INSUFFLATION (TUBING) ×2 IMPLANT

## 2018-09-13 NOTE — Anesthesia Procedure Notes (Signed)
Procedure Name: Intubation Date/Time: 09/13/2018 1:24 PM Performed by: Justus Memory, CRNA Pre-anesthesia Checklist: Patient identified, Patient being monitored, Timeout performed, Emergency Drugs available and Suction available Patient Re-evaluated:Patient Re-evaluated prior to induction Oxygen Delivery Method: Circle system utilized Preoxygenation: Pre-oxygenation with 100% oxygen Induction Type: IV induction Ventilation: Mask ventilation without difficulty Laryngoscope Size: Mac and 3 Grade View: Grade I Tube type: Oral Tube size: 7.0 mm Number of attempts: 1 Airway Equipment and Method: Stylet Placement Confirmation: ETT inserted through vocal cords under direct vision,  positive ETCO2 and breath sounds checked- equal and bilateral Secured at: 21 cm Tube secured with: Tape Dental Injury: Teeth and Oropharynx as per pre-operative assessment  Difficulty Due To: Difficulty was unanticipated and Difficult Airway- due to anterior larynx Future Recommendations: Recommend- induction with short-acting agent, and alternative techniques readily available

## 2018-09-13 NOTE — Anesthesia Post-op Follow-up Note (Signed)
Anesthesia QCDR form completed.        

## 2018-09-13 NOTE — Anesthesia Preprocedure Evaluation (Signed)
Anesthesia Evaluation  Patient identified by MRN, date of birth, ID band Patient awake    Reviewed: Allergy & Precautions, H&P , NPO status , Patient's Chart, lab work & pertinent test results, reviewed documented beta blocker date and time   Airway Mallampati: II  TM Distance: >3 FB Neck ROM: full    Dental  (+) Edentulous Upper, Teeth Intact   Pulmonary neg pulmonary ROS, former smoker,    Pulmonary exam normal        Cardiovascular Exercise Tolerance: Good hypertension, + CAD  negative cardio ROS Normal cardiovascular exam+ dysrhythmias  Rhythm:regular Rate:Normal     Neuro/Psych negative neurological ROS  negative psych ROS   GI/Hepatic negative GI ROS, Neg liver ROS,   Endo/Other  negative endocrine ROS  Renal/GU negative Renal ROS  negative genitourinary   Musculoskeletal   Abdominal   Peds  Hematology negative hematology ROS (+)   Anesthesia Other Findings Past Medical History: 05/09/2017: Adrenal adenoma     Comment:  2 cm on scan May 2018; refer to endo No date: BPH (benign prostatic hyperplasia) No date: Hyperlipidemia No date: Hypertension     Comment:  For irregular heart beat No date: Irregular heartbeat     Comment:  been taking Atenolol for 20 years Past Surgical History: 2008: COLONOSCOPY 08/20/2018: ESOPHAGOGASTRODUODENOSCOPY (EGD) WITH PROPOFOL; N/A     Comment:  Procedure: ESOPHAGOGASTRODUODENOSCOPY (EGD) WITH               PROPOFOL;  Surgeon: Lucilla Lame, MD;  Location: [facility]               ENDOSCOPY;  Service: Endoscopy;  Laterality: N/A; 1955: TONSILLECTOMY AND ADENOIDECTOMY BMI    Body Mass Index:  27.12 kg/m     Reproductive/Obstetrics negative OB ROS                             Anesthesia Physical Anesthesia Plan  ASA: II  Anesthesia Plan: General ETT   Post-op Pain Management:    Induction:   PONV Risk Score and Plan:   Airway Management  Planned:   Additional Equipment:   Intra-op Plan:   Post-operative Plan:   Informed Consent: I have reviewed the patients History and Physical, chart, labs and discussed the procedure including the risks, benefits and alternatives for the proposed anesthesia with the patient or authorized representative who has indicated his/her understanding and acceptance.   Dental Advisory Given  Plan Discussed with: CRNA  Anesthesia Plan Comments:         Anesthesia Quick Evaluation

## 2018-09-13 NOTE — Op Note (Signed)
Laparoscopic Cholecystectomy  Pre-operative Diagnosis: Biliary dyskinesia  Post-operative Diagnosis: Same  Surgeon: Caroleen Hamman, MD FACS  Anesthesia: Gen. with endotracheal tube     Findings: Mild Cholecystitis   Estimated Blood Loss: 5cc         Drains: none         Specimens: Gallbladder           Complications: none   Procedure Details  The patient was seen again in the Holding Room. The benefits, complications, treatment options, and expected outcomes were discussed with the patient. The risks of bleeding, infection, recurrence of symptoms, failure to resolve symptoms, bile duct damage, bile duct leak, retained common bile duct stone, bowel injury, any of which could require further surgery and/or ERCP, stent, or papillotomy were reviewed with the patient. The likelihood of improving the patient's symptoms with return to their baseline status is good.  The patient and/or family concurred with the proposed plan, giving informed consent.  The patient was taken to Operating Room, identified as Stephen Stein and the procedure verified as Laparoscopic Cholecystectomy.  A Time Out was held and the above information confirmed.  Prior to the induction of general anesthesia, antibiotic prophylaxis was administered. VTE prophylaxis was in place. General endotracheal anesthesia was then administered and tolerated well. After the induction, the abdomen was prepped with Chloraprep and draped in the sterile fashion. The patient was positioned in the supine position.  Cut down technique was used to enter the abdominal cavity and a Hasson trochar was placed after two vicryl stitches were anchored to the fascia. Pneumoperitoneum was then created with CO2 and tolerated well without any adverse changes in the patient's vital signs.  Three 5-mm ports were placed in the right upper quadrant all under direct vision. All skin incisions  were infiltrated with a local anesthetic agent before making the  incision and placing the trocars.   The patient was positioned  in reverse Trendelenburg, tilted slightly to the patient's left.  The gallbladder was identified, the fundus grasped and retracted cephalad. Adhesions were lysed bluntly. The infundibulum was grasped and retracted laterally, exposing the peritoneum overlying the triangle of Calot. This was then divided and exposed in a blunt fashion. An extended critical view of the cystic duct and cystic artery was obtained.  The cystic duct was clearly identified and bluntly dissected.   Artery and duct were double clipped and divided.  The gallbladder was taken from the gallbladder fossa in a retrograde fashion with the electrocautery. The gallbladder was removed and placed in an Endocatch bag. The liver bed was irrigated and inspected. Hemostasis was achieved with the electrocautery. Copious irrigation was utilized and was repeatedly aspirated until clear.  The gallbladder and Endocatch sac were then removed through a port site.   Inspection of the right upper quadrant was performed. No bleeding, bile duct injury or leak, or bowel injury was noted. Pneumoperitoneum was released.  The periumbilical port site was closed with interrumpted 0 Vicryl sutures. 4-0 subcuticular Monocryl was used to close the skin. Dermabond was  applied.  The patient was then extubated and brought to the recovery room in stable condition. Sponge, lap, and needle counts were correct at closure and at the conclusion of the case.               Caroleen Hamman, MD, FACS

## 2018-09-13 NOTE — Transfer of Care (Addendum)
Immediate Anesthesia Transfer of Care Note  Patient: Stephen Stein  Procedure(s) Performed: LAPAROSCOPIC CHOLECYSTECTOMY (N/A )  Patient Location: PACU  Anesthesia Type:General  Level of Consciousness: sedated  Airway & Oxygen Therapy: Patient Spontanous Breathing and Patient connected to face mask oxygen  Post-op Assessment: Report given to RN and Post -op Vital signs reviewed and stable  Post vital signs: Reviewed and stable  Last Vitals:  Vitals Value Taken Time  BP 137/72 09/13/2018  2:43 PM  Temp    Pulse 74 09/13/2018  2:51 PM  Resp 20 09/13/2018  2:51 PM  SpO2 100 % 09/13/2018  2:51 PM  Vitals shown include unvalidated device data.  Last Pain:  Vitals:   09/13/18 1434  TempSrc:   PainSc: 8       Patients Stated Pain Goal: 0 (86/75/19 8242)  Complications: No apparent anesthesia complications

## 2018-09-13 NOTE — Interval H&P Note (Signed)
History and Physical Interval Note:  09/13/2018 1:10 PM  Stephen Stein  has presented today for surgery, with the diagnosis of n/a  The various methods of treatment have been discussed with the patient and family. After consideration of risks, benefits and other options for treatment, the patient has consented to  Procedure(s): LAPAROSCOPIC CHOLECYSTECTOMY (N/A) as a surgical intervention .  The patient's history has been reviewed, patient examined, no change in status, stable for surgery.  I have reviewed the patient's chart and labs.  Questions were answered to the patient's satisfaction.     [place]

## 2018-09-13 NOTE — Discharge Instructions (Addendum)
Laparoscopic Cholecystectomy, Care After  ° °These instructions give you information on caring for yourself after your procedure. Your doctor may also give you more specific instructions. Call your doctor if you have any problems or questions after your procedure.  °HOME CARE  °Change your bandages (dressings) as told by your doctor.  °Keep the wound dry and clean. Wash the wound gently with soap and water. Pat the wound dry with a clean towel.  °Do not take baths, swim, or use hot tubs for 2 weeks, or as told by your doctor.  °Only take medicine as told by your doctor.  °Eat a normal diet as told by your doctor.  °Do not lift anything heavier than 10 pounds (4.5 kg) until your doctor says it is okay.  °Do not play contact sports for 1 week, or as told by your doctor. °GET HELP IF:  °Your wound is red, puffy (swollen), or painful.  °You have yellowish-white fluid (pus) coming from the wound.  °You have fluid draining from the wound for more than 1 day.  °You have a bad smell coming from the wound.  °Your wound breaks open. °GET HELP RIGHT AWAY IF:  °You have trouble breathing.  °You have chest pain.  °You have a fever >101  °You have pain in the shoulders (shoulder strap areas) that is getting worse.  °You feel dizzy or pass out (faint).  °You have severe belly (abdominal) pain.  °You feel sick to your stomach (nauseous) or throw up (vomit) for more than 1 day. ° ° ° °AMBULATORY SURGERY  °DISCHARGE INSTRUCTIONS ° ° °1) The drugs that you were given will stay in your system until tomorrow so for the next 24 hours you should not: ° °A) Drive an automobile °B) Make any legal decisions °C) Drink any alcoholic beverage ° ° °2) You may resume regular meals tomorrow.  Today it is better to start with liquids and gradually work up to solid foods. ° °You may eat anything you prefer, but it is better to start with liquids, then soup and crackers, and gradually work up to solid foods. ° ° °3) Please notify your doctor  immediately if you have any unusual bleeding, trouble breathing, redness and pain at the surgery site, drainage, fever, or pain not relieved by medication. ° ° ° °4) Additional Instructions: ° ° ° ° ° ° ° °Please contact your physician with any problems or Same Day Surgery at [phone], Monday through Friday 6 am to 4 pm, or  at [place] Main number at [phone]. ° ° °

## 2018-09-14 ENCOUNTER — Encounter: Payer: Self-pay | Admitting: Surgery

## 2018-09-14 NOTE — Anesthesia Postprocedure Evaluation (Signed)
Anesthesia Post Note  Patient: Stephen Stein  Procedure(s) Performed: LAPAROSCOPIC CHOLECYSTECTOMY (N/A )  Patient location during evaluation: PACU Anesthesia Type: General Level of consciousness: awake and alert Pain management: pain level controlled Vital Signs Assessment: post-procedure vital signs reviewed and stable Respiratory status: spontaneous breathing, nonlabored ventilation, respiratory function stable and patient connected to nasal cannula oxygen Cardiovascular status: blood pressure returned to baseline and stable Postop Assessment: no apparent nausea or vomiting Anesthetic complications: no     Last Vitals:  Vitals:   09/13/18 1537 09/13/18 1606  BP: (!) 124/91 119/60  Pulse: 76 80  Resp: 16   Temp: (!) 36.2 C   SpO2: 100%     Last Pain:  Vitals:   09/13/18 1537  TempSrc: Temporal  PainSc: 0-No pain                 Molli Barrows

## 2018-09-17 ENCOUNTER — Telehealth: Payer: Self-pay | Admitting: *Deleted

## 2018-09-17 ENCOUNTER — Other Ambulatory Visit: Payer: Self-pay

## 2018-09-17 LAB — SURGICAL PATHOLOGY

## 2018-09-17 NOTE — Telephone Encounter (Signed)
Called patient back and he stated that ever since his surgery (laparoscopic cholecystectomy 09/13/2018 by Dr. Dahlia Byes) he had been nauseated in the AM's, dizzy at times during the day and appetite had not been the best since everything doesn't taste good. He stated that his wife bought him Ensures so he could sip them during the day. I told him that it all sounds normal after having surgery. However, I asked if he would be okay for me to ask Dr. Dahlia Byes to prescribe him something for his nausea and he declined. He stated that he had been given Zofran, Phenergan and Compazine and that they all make him feel worse. I told patient to try to mix his Ensures with vanilla ice cream and make a milkshake. He stated that he would try it. I told patient to give [place] a call tomorrow if he was not feeling any better and he agreed. He had no further questions.

## 2018-09-17 NOTE — Telephone Encounter (Signed)
Patient called stated that he had surgery by Dr.Pabon on 09/13/18 and is still having the same symptoms he had before surgery. Patient stated that he is dizzy and very nauseous. He is having a difficult time keeping anything down. Please call and advise.

## 2018-09-18 ENCOUNTER — Telehealth: Payer: Self-pay | Admitting: Surgery

## 2018-09-18 NOTE — Telephone Encounter (Signed)
Patients calling said he is still having some dizziness and nausea. Please call patient and advise.

## 2018-09-18 NOTE — Telephone Encounter (Signed)
Called patient back and he wanted to let us know that he continues to have dizzyness and nausea. He denies vomiting, fever, chills, constipation and diarrhea. He also stated that his appetite is not quiet good. He stated that he tries to eat but everything tastes "salty or not right". I asked him if he was interested in nausea medicine today since yesterday he did not want any because it makes him feel worse when he had taken them in the past. I told him that I would have to ask our physician for further recommendations and that I would call him back. Patient understood.

## 2018-09-18 NOTE — Telephone Encounter (Signed)
Patient called back and he stated that since he was not feeling any better, he wanted to see Dr. Dahlia Byes tomorrow. Appointment was scheduled.

## 2018-09-19 ENCOUNTER — Encounter: Payer: Self-pay | Admitting: Surgery

## 2018-09-19 ENCOUNTER — Ambulatory Visit (INDEPENDENT_AMBULATORY_CARE_PROVIDER_SITE_OTHER): Payer: PPO | Admitting: Surgery

## 2018-09-19 VITALS — BP 120/70 | HR 68 | Temp 97.7°F | Ht 70.0 in | Wt 192.0 lb

## 2018-09-19 DIAGNOSIS — Z09 Encounter for follow-up examination after completed treatment for conditions other than malignant neoplasm: Secondary | ICD-10-CM

## 2018-09-19 NOTE — Progress Notes (Signed)
S/p lap chole last week Path d/w pt Doing better Improvement of symptoms after surgery Persistent dizziness ( preop)  PE NAD Abd: soft, nt, incisions c/d/i, no infection  A/p Doing well No heavy lifting RTC prn

## 2018-09-19 NOTE — Patient Instructions (Addendum)
Return as needed.The patient is aware to call back for any questions or concerns.  

## 2018-09-21 ENCOUNTER — Encounter: Payer: Self-pay | Admitting: Nurse Practitioner

## 2018-09-21 ENCOUNTER — Ambulatory Visit (INDEPENDENT_AMBULATORY_CARE_PROVIDER_SITE_OTHER): Payer: PPO | Admitting: Nurse Practitioner

## 2018-09-21 VITALS — BP 100/70 | HR 70 | Temp 98.2°F | Resp 16 | Ht 70.0 in | Wt 189.9 lb

## 2018-09-21 DIAGNOSIS — R251 Tremor, unspecified: Secondary | ICD-10-CM | POA: Diagnosis not present

## 2018-09-21 DIAGNOSIS — R42 Dizziness and giddiness: Secondary | ICD-10-CM

## 2018-09-21 DIAGNOSIS — H9313 Tinnitus, bilateral: Secondary | ICD-10-CM | POA: Diagnosis not present

## 2018-09-21 DIAGNOSIS — R2689 Other abnormalities of gait and mobility: Secondary | ICD-10-CM | POA: Diagnosis not present

## 2018-09-21 NOTE — Patient Instructions (Addendum)
-   Sending referral to ENT and PT to help with dizziness symptoms and off balance.  - Please set-up follow up with Dr. Rockey Situ to discuss atenolol and dizziness if still persisting.  - Please ensure proper hydration (at least 64 ounces of water a day)  - Please call [phone] to schedule your imaging test. Please wait 2-3 days after the order has been placed to call and get your test scheduled

## 2018-09-21 NOTE — Progress Notes (Signed)
mriNameAMI Stein   MRN: [id number]    DOB: [birth year]   Date:09/21/2018       Progress Note  Subjective  Chief Complaint  Chief Complaint  Patient presents with  . Dizziness  . Gait Problem    HPI  Patient has had ongoing dizziness and feeling off balance and very unsteady on his feet for the 2 months has been intermittent initially and has become constant the last week. States is worse when standing up, not when moving head from side to side. Laying down makes it better states but when he wakes up he feels dizzy again before he sits up. Mild tinnitus with the dizziness but states is barely noticeable now and tinnitus has improved in the past few days. Wife notes he has developed bilateral hand tremors occasionally. States dizziness ongoing when he tries to sleep so it makes it difficult to sleep. Had tried trazodone without relief.   Denies palpitations; falls Patient restarted atenolol 2 days ago and dizziness has been the same.   States nausea has improved and appetite has improves since surgery; feels he is healing well. Wt Readings from Last 3 Encounters:  09/21/18 189 lb 14.4 oz (86.1 kg)  09/19/18 192 lb (87.1 kg)  09/13/18 189 lb (85.7 kg)   No visual changes, headaches, unilateral weakness, paresthesia, speech changes.   Patient Active Problem List   Diagnosis Date Noted  . Biliary dyskinesia   . Protein-calorie malnutrition, severe 08/20/2018  . Gastritis without bleeding   . Duodenitis   . Nausea and vomiting 08/18/2018  . Left bundle branch block 06/25/2018  . Second degree AV block, Mobitz type I 06/25/2018  . Adrenal adenoma 05/09/2017  . Pulmonary nodules 04/19/2017  . Medication monitoring encounter 04/19/2017  . BPH (benign prostatic hyperplasia) 04/19/2017  . Pain in wrist 01/20/2017  . Elevated serum creatinine 10/04/2016  . 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 CAD (coronary artery disease) 03/25/2016  . Abnormal EKG 12/16/2015  .  Hyperlipidemia 07/07/2015  . Essential hypertension 07/07/2015  . Hyperglycemia 07/07/2015    Past Medical History:  Diagnosis Date  . 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 BPH (benign prostatic hyperplasia)   . Hyperlipidemia   . Hypertension    For irregular heart beat  . Irregular heartbeat    been taking Atenolol for 20 years    Past Surgical History:  Procedure Laterality Date  . CHOLECYSTECTOMY N/A 09/13/2018   Procedure: LAPAROSCOPIC CHOLECYSTECTOMY;  Surgeon: Jules Husbands, MD;  Location: [facility] ORS;  Service: General;  Laterality: N/A;  . COLONOSCOPY  2008  . ESOPHAGOGASTRODUODENOSCOPY (EGD) WITH PROPOFOL N/A 08/20/2018   Procedure: ESOPHAGOGASTRODUODENOSCOPY (EGD) WITH PROPOFOL;  Surgeon: Lucilla Lame, MD;  Location: [facility]COPY;  Service: Endoscopy;  Laterality: N/A;  . TONSILLECTOMY AND ADENOIDECTOMY  1955    Social History   Tobacco Use  . Smoking status: Former Smoker    Last attempt to quit: 06/22/1992    Years since quitting: 26.2  . Smokeless tobacco: Former Systems developer    Quit date: 06/22/1992  Substance Use Topics  . Alcohol use: Yes    Alcohol/week: 1.0 standard drinks    Types: 1 Cans of beer per week    Comment: occasional     Current Outpatient Medications:  .  atenolol (TENORMIN) 25 MG tablet, Take 25 mg by mouth daily., Disp: , Rfl:  .  esomeprazole (NEXIUM) 40 MG capsule, Take 40 mg by  mouth daily. , Disp: , Rfl:   No Known Allergies  ROS  No other specific complaints in a complete review of systems (except as listed in HPI above).  Objective  Vitals:   09/21/18 1111  BP: 100/70  Pulse: 70  Resp: 16  Temp: 98.2 F (36.8 C)  TempSrc: Oral  SpO2: 98%  Weight: 189 lb 14.4 oz (86.1 kg)  Height: 5\' 10"  (1.778 m)     Body mass index is 27.25 kg/m.  Nursing Note and Vital Signs reviewed.  Physical Exam  Constitutional: He is oriented to person, place, and time. He appears well-developed and  well-nourished.  HENT:  Head: Normocephalic and atraumatic.  Right Ear: Hearing, tympanic membrane, external ear and ear canal normal.  Left Ear: Hearing, tympanic membrane, external ear and ear canal normal.  Eyes: Pupils are equal, round, and reactive to light. EOM are normal.  Cardiovascular: Normal rate, regular rhythm and intact distal pulses.  Pulmonary/Chest: Effort normal and breath sounds normal.  Musculoskeletal: Normal range of motion. He exhibits no tenderness or deformity.  Neurological: He is alert and oriented to person, place, and time. He displays tremor. No sensory deficit. He exhibits normal muscle tone. Coordination abnormal. GCS eye subscore is 4. GCS verbal subscore is 5. GCS motor subscore is 6.  Positive Romberg, unable to prefer heel-to-toe due to off balance. No issues with walking on heels or toe pad.   Skin: Skin is warm and dry. No rash noted.  Psychiatric: He has a normal mood and affect. His behavior is normal. Judgment and thought content normal.  Vitals reviewed.     No results found for this or any previous visit (from the past 48 hour(s)).  Assessment & Plan  1. Dizziness Ensure proper hydration- as dizziness has become constant and he is noticing it prior to sitting up in bed along with coordination and balance instability will order MRI. Follow up with cardiology- patient will set up with PCP - Ambulatory referral to ENT - Ambulatory referral to Physical Therapy - MR Brain Wo Contrast; Future  2. Balance problems - Ambulatory referral to Physical Therapy - MR Brain Wo Contrast; Future  3. Tinnitus of both ears - Ambulatory referral to ENT  4. 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 Will monitor.  - MR Brain Wo Contrast; Future   Face-to-face time with patient was more than 25 minutes, >50% time spent counseling and coordination of care .

## 2018-09-24 ENCOUNTER — Ambulatory Visit (INDEPENDENT_AMBULATORY_CARE_PROVIDER_SITE_OTHER): Payer: PPO | Admitting: Nurse Practitioner

## 2018-09-24 ENCOUNTER — Encounter: Payer: PPO | Admitting: Surgery

## 2018-09-24 ENCOUNTER — Telehealth: Payer: Self-pay | Admitting: Cardiovascular Disease

## 2018-09-24 ENCOUNTER — Encounter: Payer: Self-pay | Admitting: Nurse Practitioner

## 2018-09-24 VITALS — BP 100/70 | HR 85 | Temp 97.6°F | Resp 16 | Ht 70.0 in | Wt 188.5 lb

## 2018-09-24 DIAGNOSIS — Z23 Encounter for immunization: Secondary | ICD-10-CM | POA: Diagnosis not present

## 2018-09-24 DIAGNOSIS — J4520 Mild intermittent asthma, uncomplicated: Secondary | ICD-10-CM | POA: Diagnosis not present

## 2018-09-24 DIAGNOSIS — R42 Dizziness and giddiness: Secondary | ICD-10-CM

## 2018-09-24 DIAGNOSIS — I499 Cardiac arrhythmia, unspecified: Secondary | ICD-10-CM | POA: Diagnosis not present

## 2018-09-24 DIAGNOSIS — H903 Sensorineural hearing loss, bilateral: Secondary | ICD-10-CM | POA: Diagnosis not present

## 2018-09-24 NOTE — Telephone Encounter (Signed)
Pt c/o medication issue:  1. Name of Medication: atenolol  2. How are you currently taking this medication (dosage and times per day)? Dc by another provider   3. Are you having a reaction (difficulty breathing--STAT)? Dizziness   4. What is your medication issue? Patient had gallbladder removed x 2 weeks ago and nausea has gone away but he continues to be dizzy. Patient pcp dc atenolol and advised him to talk to dr. Rockey Situ

## 2018-09-24 NOTE — Telephone Encounter (Signed)
Patient returning call re: atenolol see below

## 2018-09-24 NOTE — Telephone Encounter (Signed)
No answer. Left message to call back.   

## 2018-09-24 NOTE — Progress Notes (Signed)
Name: Stephen Stein   MRN: [id number]    DOB: July 22, 1941   Date:09/24/2018       Progress Note  Subjective  Chief Complaint  Chief Complaint  Patient presents with  . Dizziness    HPI Patient presents with continued dizziness. Has still been taking atenolol; had been off of it for 3 weeks and was still having dizzy episodes. Will make appointment with cardiology to be re-evaluated.  Surgical sites healing well; no pain. He has no nausea and more appetite, eating soft foods most of the time, tried steak yestrday was more difficult didn't feel well afterwards. Drinking ensure. No increase in activity.   Has appointment with ENT today. Awaiting PT consult; will call to schedule MRI  Wt Readings from Last 3 Encounters:  09/24/18 188 lb 8 oz (85.5 kg)  09/21/18 189 lb 14.4 oz (86.1 kg)  09/19/18 192 lb (87.1 kg)     Patient Active Problem List   Diagnosis Date Noted  . Biliary dyskinesia   . Protein-calorie malnutrition, severe 08/20/2018  . Gastritis without bleeding   . Duodenitis   . Nausea and vomiting 08/18/2018  . Left bundle branch block 06/25/2018  . Second degree AV block, Mobitz type I 06/25/2018  . Adrenal adenoma 05/09/2017  . Pulmonary nodules 04/19/2017  . Medication monitoring encounter 04/19/2017  . BPH (benign prostatic hyperplasia) 04/19/2017  . Pain in wrist 01/20/2017  . Elevated serum creatinine 10/04/2016  . 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 CAD (coronary artery disease) 03/25/2016  . Abnormal EKG 12/16/2015  . Hyperlipidemia 07/07/2015  . Essential hypertension 07/07/2015  . Hyperglycemia 07/07/2015    Past Medical History:  Diagnosis Date  . 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 BPH (benign prostatic hyperplasia)   . Hyperlipidemia   . Hypertension    For irregular heart beat  . Irregular heartbeat    been taking Atenolol for 20 years    Past Surgical History:  Procedure Laterality Date  .  CHOLECYSTECTOMY N/A 09/13/2018   Procedure: LAPAROSCOPIC CHOLECYSTECTOMY;  Surgeon: Jules Husbands, MD;  Location: [facility] ORS;  Service: General;  Laterality: N/A;  . COLONOSCOPY  2008  . ESOPHAGOGASTRODUODENOSCOPY (EGD) WITH PROPOFOL N/A 08/20/2018   Procedure: ESOPHAGOGASTRODUODENOSCOPY (EGD) WITH PROPOFOL;  Surgeon: Lucilla Lame, MD;  Location: [facility] ENDOSCOPY;  Service: Endoscopy;  Laterality: N/A;  . TONSILLECTOMY AND ADENOIDECTOMY  1955    Social History   Tobacco Use  . Smoking status: Former Smoker    Last attempt to quit: 06/22/1992    Years since quitting: 26.2  . Smokeless tobacco: Former Systems developer    Quit date: 06/22/1992  Substance Use Topics  . Alcohol use: Yes    Alcohol/week: 1.0 standard drinks    Types: 1 Cans of beer per week    Comment: occasional     Current Outpatient Medications:  .  atenolol (TENORMIN) 25 MG tablet, Take 25 mg by mouth daily., Disp: , Rfl:  .  esomeprazole (NEXIUM) 40 MG capsule, Take 40 mg by mouth daily. , Disp: , Rfl:   No Known Allergies  Review of Systems  Eyes: Negative for blurred vision and double vision.  Respiratory: Negative for shortness of breath.   Cardiovascular: Negative for chest pain and palpitations.  Neurological: Positive for dizziness. Negative for headaches.     No other specific complaints in a complete review of systems (except as listed in HPI above).  Objective  Vitals:  09/24/18 0812  BP: 100/70  Pulse: 85  Resp: 16  Temp: 97.6 F (36.4 C)  TempSrc: Oral  SpO2: 98%  Weight: 188 lb 8 oz (85.5 kg)  Height: 5\' 10"  (1.778 m)    Body mass index is 27.05 kg/m.  Nursing Note and Vital Signs reviewed.  Physical Exam  Constitutional: He is oriented to person, place, and time. He appears well-developed and well-nourished.  Cardiovascular: Normal rate, regular rhythm and intact distal pulses.  Pulmonary/Chest: Effort normal and breath sounds normal.  Abdominal: Soft. Bowel sounds are normal. There is no  tenderness.  Neurological: He is alert and oriented to person, place, and time.  Skin: Skin is warm and dry.  Psychiatric: He has a normal mood and affect. His behavior is normal. Judgment and thought content normal.      No results found for this or any previous visit (from the past 48 hour(s)).  Assessment & Plan  1. Dizziness Please follow up with ENT, PT and Cardiology. Awaiting MRI results and follow-up PRN. Discussed ER precautions.

## 2018-09-25 ENCOUNTER — Encounter: Payer: Self-pay | Admitting: Nurse Practitioner

## 2018-09-25 ENCOUNTER — Ambulatory Visit: Payer: PPO | Admitting: Nurse Practitioner

## 2018-09-25 VITALS — BP 100/64 | HR 64 | Ht 70.0 in | Wt 190.5 lb

## 2018-09-25 DIAGNOSIS — R42 Dizziness and giddiness: Secondary | ICD-10-CM | POA: Diagnosis not present

## 2018-09-25 DIAGNOSIS — I447 Left bundle-branch block, unspecified: Secondary | ICD-10-CM

## 2018-09-25 DIAGNOSIS — I951 Orthostatic hypotension: Secondary | ICD-10-CM | POA: Diagnosis not present

## 2018-09-25 DIAGNOSIS — R931 Abnormal findings on diagnostic imaging of heart and coronary circulation: Secondary | ICD-10-CM | POA: Diagnosis not present

## 2018-09-25 NOTE — Patient Instructions (Addendum)
Medication Instructions: - Your physician has recommended you make the following change in your medication:   1) STOP atenolol  Labwork: - none ordered  Procedures/Testing: - Your physician has requested that you have an echocardiogram. Echocardiography is a painless test that uses sound waves to create images of your heart. It provides your doctor with information about the size and shape of your heart and how well your heart's chambers and valves are working. This procedure takes approximately one hour. There are no restrictions for this procedure. Please make sure that you do come hydrated to the test as occasionally an IV will need to be started to inject an image enhancer. This allows for better quality pictures, but we will not know if this is needed until the test begins.  Follow-Up: - Your physician recommends that you schedule a follow-up appointment in: 1 month with Dr. Arley Phenix, NP   Any Additional Special Instructions Will Be Listed Below (If Applicable).  - Wear compression socks - you may liberalize your salt intake   If you need a refill on your cardiac medications before your next appointment, please call your pharmacy.    Echocardiogram An echocardiogram, or echocardiography, uses sound waves (ultrasound) to produce an image of your heart. The echocardiogram is simple, painless, obtained within a short period of time, and offers valuable information to your health care provider. The images from an echocardiogram can provide information such as:  Evidence of coronary artery disease (CAD).  Heart size.  Heart muscle function.  Heart valve function.  Aneurysm detection.  Evidence of a past heart attack.  Fluid buildup around the heart.  Heart muscle thickening.  Assess heart valve function.  Tell a health care provider about:  Any allergies you have.  All medicines you are taking, including vitamins, herbs, eye drops, creams, and  over-the-counter medicines.  Any problems you or family members have had with anesthetic medicines.  Any blood disorders you have.  Any surgeries you have had.  Any medical conditions you have.  Whether you are pregnant or may be pregnant. What happens before the procedure? No special preparation is needed. Eat and drink normally. What happens during the procedure?  In order to produce an image of your heart, gel will be applied to your chest and a wand-like tool (transducer) will be moved over your chest. The gel will help transmit the sound waves from the transducer. The sound waves will harmlessly bounce off your heart to allow the heart images to be captured in real-time motion. These images will then be recorded.  You may need an IV to receive a medicine that improves the quality of the pictures. What happens after the procedure? You may return to your normal schedule including diet, activities, and medicines, unless your health care provider tells you otherwise. This information is not intended to replace advice given to you by your health care provider. Make sure you discuss any questions you have with your health care provider. Document Released: 12/09/2000 Document Revised: 07/30/2016 Document Reviewed: 08/19/2013 Elsevier Interactive Patient Education  2017 [employer].

## 2018-09-25 NOTE — Progress Notes (Signed)
Office Visit    Patient Name: Stephen Stein Date of Encounter: 09/25/2018  Primary Care Provider:  Arnetha Courser, MD Primary Cardiologist:  Ida Rogue, MD  Chief Complaint    77 year old male with a prior history of hypertension, hyperlipidemia, palpitations, coronary artery calcifications with low risk stress testing, BPH, and recent biliary dyskinesia/cholecystitis status post cholecystectomy, who presents for follow-up related to dizziness.  Past Medical History    Past Medical History:  Diagnosis Date  . 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 Biliary dyskinesia    a.08/2018 s/p lap chole.  [name] BPH (benign prostatic hyperplasia)   . Coronary artery calcification seen on CT scan    a. 11/2015 Cardiac CT: Cor Ca2+ = 892 (80th %'ile). Diffuse Ca2+ in all 3 major coronary arteries; b. 02/2016 MV: HTN response to exercise. Abnl ecg w/ horizontal inflat ST depression, but no ischemia/infarct on imaging.  . Dizziness   . Hyperlipidemia   . Hypertension    For irregular heart beat  . Irregular heartbeat/Palpitations    a. Has been taking Atenolol for 20 years   Past Surgical History:  Procedure Laterality Date  . CHOLECYSTECTOMY N/A 09/13/2018   Procedure: LAPAROSCOPIC CHOLECYSTECTOMY;  Surgeon: Jules Husbands, MD;  Location: [facility] ORS;  Service: General;  Laterality: N/A;  . COLONOSCOPY  2008  . ESOPHAGOGASTRODUODENOSCOPY (EGD) WITH PROPOFOL N/A 08/20/2018   Procedure: ESOPHAGOGASTRODUODENOSCOPY (EGD) WITH PROPOFOL;  Surgeon: Lucilla Lame, MD;  Location: [facility] ENDOSCOPY;  Service: Endoscopy;  Laterality: N/A;  . TONSILLECTOMY AND ADENOIDECTOMY  1955    Allergies  No Known Allergies  History of Present Illness    77 year old male with above past medical history including hypertension, hyperlipidemia, palpitations, and BPH.  He was previously evaluated with coronary CT with calcium scoring in December 2016, revealing a calcium score of 892,  placing him in the 80th percentile.  This was followed by stress testing in March 2017, which was low risk and nonischemic.  He was last seen in clinic here in July, at which time he was doing well.  He was noted on ECG to have a new left bundle branch block and also a Mobitz 1 heart block.  He was asymptomatic and hemodynamically stable.  His atenolol dose had previously been reduced in the setting of bradycardia.  In August, he was admitted with nausea and abdominal pain and was diagnosed with biliary dyskinesia and subsequently underwent cholecystectomy September 19.  Since his gallbladder diagnosis about 6 to 7 weeks ago, he has been less active and has been dealing with nausea and vomiting.  His weight has come down 36 pounds since July simply in the setting of poor appetite.  Over the past 6 to 7 weeks, he has been experiencing lightheadedness, especially when changing positions.  His atenolol was placed on hold several weeks ago and he is not sure that he noticed any significant improvement in his lightheadedness but also notes that he just felt generally poor in the setting of nausea related to gallbladder disease.  He decided to resume atenolol on his own a few days ago because he was taking it secondary to prior history of palpitations and though he was not having palpitations, he was concerned that they might return.  Since resuming atenolol, he has had some worsening of orthostatic lightheadedness.  He often feels swimmy headed throughout the day regardless of position changes.  As result, he presents today for evaluation.  He has not been having any chest pain, dyspnea, palpitations, PND, orthopnea, syncope, edema, or early satiety.  We discussed possibly liberalizing salt but he notes that ever since his gallbladder issues, he has a heightened sense of taste as well as smell and even small amounts of salt seem very salty to him and he has developed an aversion.  He has been trying to force p.o. fluids  and drinks 3 ensures every day.  Ever since his cholecystectomy, his nausea and vomiting have completely resolved but lightheadedness persists.  Home Medications    Prior to Admission medications   Medication Sig Start Date End Date Taking? Authorizing Provider  atenolol (TENORMIN) 25 MG tablet Take 25 mg by mouth daily.   Yes [provider]  esomeprazole (NEXIUM) 40 MG capsule Take 40 mg by mouth daily.  08/16/18  Yes [provider]    Review of Systems    Lightheadedness as outlined above.  He has had resolution of nausea and vomiting following cholecystectomy.  He has had some fatigue but denies chest pain, palpitations, PND, orthopnea, dyspnea, edema.  All other systems reviewed and are otherwise negative except as noted above.  Physical Exam    VS:  BP 100/64 (BP Location: Left Arm, Patient Position: Sitting, Cuff Size: Normal)   Pulse 64   Ht 5\' 10"  (1.778 m)   Wt 190 lb 8 oz (86.4 kg)   BMI 27.33 kg/m  , BMI Body mass index is 27.33 kg/m.  Orthostatic VS for the past 24 hrs:  BP- Lying Pulse- Lying BP- Sitting Pulse- Sitting BP- Standing at 0 minutes Pulse- Standing at 0 minutes  09/25/18 1317 102/67 66 108/69 79 93/65 79     GEN: Well nourished, well developed, in no acute distress. HEENT: normal. Neck: Supple, no JVD, carotid bruits, or masses. Cardiac: RRR, no murmurs, rubs, or gallops. No clubbing, cyanosis, edema.  Radials/DP/PT 2+ and equal bilaterally.  Respiratory:  Respirations regular and unlabored, clear to auscultation bilaterally. GI: Soft, nontender, nondistended, BS + x 4. MS: no deformity or atrophy. Skin: warm and dry, no rash. Neuro:  Strength and sensation are intact. Psych: Normal affect.  Accessory Clinical Findings    ECG personally reviewed by me today -regular sinus rhythm, 64, first-degree AV block, left bundle branch block- no acute changes.  Lab Results  Component Value Date   CREATININE 1.13 09/05/2018   BUN 10  09/05/2018   NA 136 09/05/2018   K 3.7 09/05/2018   CL 97 (L) 09/05/2018   CO2 25 09/05/2018   Lab Results  Component Value Date   ALT 21 09/05/2018   AST 20 09/05/2018   ALKPHOS 23 (L) 08/18/2018   BILITOT 0.6 09/05/2018    Lab Results  Component Value Date   WBC 8.0 08/19/2018   HGB 14.8 08/19/2018   HCT 41.6 08/19/2018   MCV 92.0 08/19/2018   PLT 190 08/19/2018    Lab Results  Component Value Date   TSH 1.812 08/18/2018    Lab Results  Component Value Date   CHOL 95 08/07/2018   HDL 33 (L) 08/07/2018   LDLCALC 45 08/07/2018   TRIG 90 08/07/2018   CHOLHDL 2.9 08/07/2018    Assessment & Plan    1.  Lightheadedness/orthostasis: Patient has a 6 to 7-week history of orthostatic lightheadedness that appears to have begun in the setting of nausea and vomiting with biliary dyskinesia.  Over that period of time, he has lost 36 pounds.  He has  been on atenolol chronically in the setting of a history of palpitations.  I suspect that with weight loss, his blood pressure has been trending low, though he does not ever check this at home, and poor p.o. intake has likely contributed to relative dehydration and orthostasis.  Atenolol was discontinued previously and he is not sure that he noticed any significant improvement in orthostasis/lightheadedness while off of it.  He resumed it on his own recently the other night and did take it last night.  Blood pressure dropped to 93/65 with standing and his heart rate rose from 66-80.  He was not having any palpitations for the period of time he was off of atenolol.  I have asked him to discontinue it completely.  We discussed liberalizing salt but as outlined in the HPI, since dealing with gallbladder issues, he notes a heightened sense of taste and even small amounts of salt seem very salty to him.  I have asked him to to wear knee-high compression socks and hydrate adequately in order to prevent pulling and orthostasis.  He was agreeable with this  plan.  Ultimately, we may need to consider something like Florinef if more simple measures are not adequate. Recent labwork reviewed with pt and wife today.  2.  Left bundle branch block: This was first noted in July.  He has not been having any chest pain or dyspnea but as noted above, has been experiencing fatigue and lightheadedness.  I will follow-up in echocardiogram to evaluate his wall motion.  He does have a history of coronary calcifications and has been on aspirin and statin therapy.  3.  Coronary calcifications: Previous nonischemic stress test in 2017.  He has not been having any chest pain or dyspnea.  As above, checking echo in the setting of fatigue and relatively recent finding of left bundle branch block.  Continue aspirin and statin therapy.  4.  Palpitations: No recent palpitations, even when he was off of beta-blocker.  Holding beta-blocker as above.  5.  Disposition: Follow-up echo as above.  Follow-up in clinic in 1 month or sooner if necessary.   Murray Hodgkins, NP 09/25/2018, 5:21 PM

## 2018-09-25 NOTE — Telephone Encounter (Signed)
Returned the call to the patient. He stated that he had gallbladder surgery two weeks ago and was advised by his PCP to make a follow up appointment with Cardiology for medication reconciliation. An appointment has been made for today at 1:30 with Murray Hodgkins, NP.

## 2018-09-26 ENCOUNTER — Ambulatory Visit
Admission: RE | Admit: 2018-09-26 | Discharge: 2018-09-26 | Disposition: A | Discharge status: Home / Self Care | Payer: PPO | Source: Ambulatory Visit | Attending: Nurse Practitioner | Admitting: Nurse Practitioner

## 2018-09-26 DIAGNOSIS — R251 Tremor, unspecified: Secondary | ICD-10-CM

## 2018-09-26 DIAGNOSIS — R42 Dizziness and giddiness: Secondary | ICD-10-CM | POA: Diagnosis not present

## 2018-09-26 DIAGNOSIS — R2689 Other abnormalities of gait and mobility: Secondary | ICD-10-CM | POA: Insufficient documentation

## 2018-09-29 ENCOUNTER — Encounter: Payer: Self-pay | Admitting: Family Medicine

## 2018-10-01 ENCOUNTER — Telehealth: Payer: Self-pay | Admitting: Cardiovascular Disease

## 2018-10-01 NOTE — Telephone Encounter (Signed)
I called and spoke with the patient.  He states that he was feeling dizzy this morning when he called with a BP of 117/96 (HR was ~ 86 bpm at the time).   I inquired if he: 1) stopped atenolol, and he confirms he did  2) is wearing compression socks- he confirms he did yesterday, but has not today  3) is increasing his fluid/ salt intake- he states he is drinking fluid, but his taste is very heightened right now and it is difficult to add additional salt  I have advised the patient, at this time, to continue to increase his fluid intake, change position slowly, wear compression socks during his waking hours, and try to take in some extra sodium if he is feeling symptomatic (excessively dizzy).  The patient is agreeable and voices understanding of the above. I have advised to call back with any questions/ concerns. He does have a new LBBB noted in July. He is scheduled for an echo on 10/09/18 and to see Dr. Rockey Situ on 10/30/18.

## 2018-10-01 NOTE — Telephone Encounter (Signed)
STAT if patient feels like he/she is going to faint   1) Are you dizzy now? A little bit   2) Do you feel faint or have you passed out? No   3) Do you have any other symptoms?  Flushed warm   4) Have you checked your HR and BP (record if available)?  BP:  117/96

## 2018-10-04 ENCOUNTER — Other Ambulatory Visit: Payer: Self-pay | Admitting: Nurse Practitioner

## 2018-10-06 ENCOUNTER — Other Ambulatory Visit: Payer: Self-pay | Admitting: Cardiovascular Disease

## 2018-10-09 ENCOUNTER — Telehealth: Payer: Self-pay | Admitting: Cardiovascular Disease

## 2018-10-09 ENCOUNTER — Ambulatory Visit: Payer: PPO

## 2018-10-09 ENCOUNTER — Ambulatory Visit (INDEPENDENT_AMBULATORY_CARE_PROVIDER_SITE_OTHER): Payer: PPO

## 2018-10-09 ENCOUNTER — Other Ambulatory Visit: Payer: Self-pay

## 2018-10-09 DIAGNOSIS — I447 Left bundle-branch block, unspecified: Secondary | ICD-10-CM | POA: Diagnosis not present

## 2018-10-09 DIAGNOSIS — I5189 Other ill-defined heart diseases: Secondary | ICD-10-CM

## 2018-10-09 DIAGNOSIS — R42 Dizziness and giddiness: Secondary | ICD-10-CM | POA: Diagnosis not present

## 2018-10-09 HISTORY — DX: Other ill-defined heart diseases: I51.89

## 2018-10-09 NOTE — Telephone Encounter (Signed)
Patient came in for Echo this morning and he stated that he's been having dizzy spells going on for about 9 weeks  He is not sure if he mentioned it in his last visit with Gerald Stabs about two weeks ago  But his spouse stated that if the dizzy spells kept going that we would be able to prescribe something for this  STAT if patient feels like he/she is going to faint   1) Are you dizzy now? No   2) Do you feel faint or have you passed out? No   3) Do you have any other symptoms? Just dizzy   4) Have you checked your HR and BP (record if available)? No   Would like a call back about this.

## 2018-10-09 NOTE — Telephone Encounter (Signed)
What have bp's been?  May need florinef if orthostatic hypotension present.

## 2018-10-09 NOTE — Telephone Encounter (Signed)
Spoke with patient and reviewed orthostatic changes and measures mentioned at last office visit. Reviewed importance of hydration, compression hose, along with using caution when changing positions. He had echocardiogram done today and advised that we will call once those results are ready. He was very appreciative for the call and I requested that if his symptoms persist or worsen to please call us back. He verbalized understanding of our conversation, agreement with plan, and had no further questions at this time. Will route to provider for review as well.

## 2018-10-10 ENCOUNTER — Telehealth: Payer: Self-pay | Admitting: Cardiovascular Disease

## 2018-10-10 DIAGNOSIS — R931 Abnormal findings on diagnostic imaging of heart and coronary circulation: Secondary | ICD-10-CM

## 2018-10-10 DIAGNOSIS — I951 Orthostatic hypotension: Secondary | ICD-10-CM

## 2018-10-10 DIAGNOSIS — R42 Dizziness and giddiness: Secondary | ICD-10-CM

## 2018-10-10 NOTE — Telephone Encounter (Signed)
Patient calling  Received notice that ECHO results were available Would like to discuss with nurse Please call

## 2018-10-10 NOTE — Telephone Encounter (Signed)
-----   Message from Ricci Barker, RN sent at 10/10/2018 12:04 PM EDT ----- Regarding: Stephen Stein and Nurse visit This patient needs to be scheduled for a Lexiscan Myoview and a Nurse visit for orthostatic vitals (per Ignacia Bayley)  Thank you

## 2018-10-10 NOTE — Telephone Encounter (Signed)
Spoke with Ignacia Bayley NP regarding patient and no blood pressure readings were available when I spoke with patient. He recommended that patient come in next week for orthostatics to see how his readings are before starting florinef.

## 2018-10-10 NOTE — Telephone Encounter (Signed)
LMOV  

## 2018-10-10 NOTE — Telephone Encounter (Signed)
Patient made aware of results and verbalized understanding. Orders placed for Lexiscan Myoview. Scheduling has been  notified.   Notes recorded by Theora Gianotti, NP on 10/09/2018 at 5:47 PM EDT Heart squeezing function is mildly reduced and down from previous measure on stress test a few yrs ago. This may be contributing to fatigue. With new reduction, recommend a lexiscan myoview to assess blood flow to heart muscle and r/o heart artery blockage as a cause.

## 2018-10-10 NOTE — Telephone Encounter (Signed)
Patient made aware of nurse visit and scheduling has been notified. He verbalized his understanding.

## 2018-10-11 ENCOUNTER — Other Ambulatory Visit: Payer: Self-pay

## 2018-10-11 NOTE — Telephone Encounter (Signed)
Spoke with patients wife per release form and reviewed appointments, instructions for stress test, and she verbalized understanding of all information. Instructed her to please call back if she should have any questions.

## 2018-10-11 NOTE — Telephone Encounter (Signed)
Refill request for general medication: Trazodone 50 mg  Last office visit: 09/24/2018  Last physical exam: None indicated  Follow up:   12/14/2018

## 2018-10-11 NOTE — Telephone Encounter (Signed)
Patient wife would like a call back  States that patient is very confused and not sure what preparation he has for myoview   Please call back   Patient is also scheduled for BP check on 10/19/18

## 2018-10-11 NOTE — Telephone Encounter (Signed)
Spoke with patient and reviewed that provider would like him to come in for nurse visit so that we can check orthostatic blood pressures and document before adding medication. Also confirmed stress test scheduled with him. Advised that I would have scheduling to set up nurse visit next week. He verbalized understanding with no further questions at this time.

## 2018-10-11 NOTE — Telephone Encounter (Signed)
Patient scheduled lexiscan myoivew for 10/23 States he already completed labs

## 2018-10-12 ENCOUNTER — Ambulatory Visit (INDEPENDENT_AMBULATORY_CARE_PROVIDER_SITE_OTHER): Payer: PPO | Admitting: Family Medicine

## 2018-10-12 ENCOUNTER — Encounter: Payer: Self-pay | Admitting: Family Medicine

## 2018-10-12 VITALS — BP 128/70 | HR 85 | Temp 97.9°F | Ht 70.0 in | Wt 189.8 lb

## 2018-10-12 DIAGNOSIS — I10 Essential (primary) hypertension: Secondary | ICD-10-CM

## 2018-10-12 DIAGNOSIS — I447 Left bundle-branch block, unspecified: Secondary | ICD-10-CM | POA: Diagnosis not present

## 2018-10-12 DIAGNOSIS — R899 Unspecified abnormal finding in specimens from other organs, systems and tissues: Secondary | ICD-10-CM | POA: Diagnosis not present

## 2018-10-12 DIAGNOSIS — Z8659 Personal history of other mental and behavioral disorders: Secondary | ICD-10-CM | POA: Insufficient documentation

## 2018-10-12 DIAGNOSIS — R413 Other amnesia: Secondary | ICD-10-CM | POA: Diagnosis not present

## 2018-10-12 NOTE — Telephone Encounter (Signed)
Is patient requesting this medication, I dont mind refilling it but he wasn't taking it last time in saw him for a visit and said it hadn't helped so I denied it the first time because I thought it was an automatic refill from the pharmacy.

## 2018-10-12 NOTE — Assessment & Plan Note (Signed)
See the 6-CIT; suggested referral to neurologist; will check B12 and TSH

## 2018-10-12 NOTE — Assessment & Plan Note (Signed)
controlled 

## 2018-10-12 NOTE — Assessment & Plan Note (Signed)
Reviewed EKG, he is having a stress test Wednesday, followed by Dr. Rockey Situ

## 2018-10-12 NOTE — Progress Notes (Signed)
BP 128/70   Pulse 85   Temp 97.9 F (36.6 C)   Ht 5\' 10"  (1.778 m)   Wt 189 lb 12.8 oz (86.1 kg)   SpO2 97%   BMI 27.23 kg/m    Subjective:    Patient ID: Stephen Stein, male    DOB: 18-Feb-1941, 77 y.o.   MRN: [id number]  HPI: Stephen Stein is a 77 y.o. male  Chief Complaint  Patient presents with  . Follow-up  . Dizziness    HPI Patient is here with his wife for follow-up His electrical impulses were not exactly right LBBB found on EKG; having stress test Wednesday His father had a heart attack in his 15s; uncle Stephen Stein had heart attack and had surgery and stent put in, now deceased; other uncle had pacemaker in his 21s  Surgery for gallbladder went well; four weeks ago If he eats fatty meat, more difficult to digest  More confused without a doubt; MRI brain October 2019 He has been depressed; passing thought of killing himself; he called lady that [place] saw; she gave him a list of other names; "I got depressed" and it was embarrassing; he was in a funk; he is feeling better now; it was such a long struggle; he has been light-headed; took meclizine; Dr. Rockey Stein does not want him to take anything until after the stress test  IMPRESSION: Normal MRI the brain for age.   Electronically Signed   By: Stephen Stein M.D.   On: 09/26/2018 17:04  Father had memory problems; he died in his late 7s; myasthenia gravis, memory problems in his 15s; he was a smoker but had quit 40-45 years before; patient quit when he was 77 years old  [place] 10/12/2018  What Year? 4 points  What month? 0 points  What time? 0 points  Count back from 20 0 points  Months in reverse 4 points  Repeat phrase 4 points  Total Score 12    Depression screen [facility] 2/9 10/12/2018 09/05/2018 09/05/2018 08/15/2018 08/10/2018  Decreased Interest 0 0 0 2 0  Down, Depressed, Hopeless 0 1 0 1 0  PHQ - 2 Score 0 1 0 3 0  Altered sleeping - 0 - 2 -  Tired, decreased energy - 3 - 3 -  Change in  appetite - 3 - 3 -  Feeling bad or failure about yourself  - 1 - 0 -  Trouble concentrating - 1 - 1 -  Moving slowly or fidgety/restless - 1 - 1 -  Suicidal thoughts - 0 - 0 -  PHQ-9 Score - 10 - 13 -  Difficult doing work/chores - Somewhat difficult - Somewhat difficult -   Fall Risk  10/12/2018 09/24/2018 09/21/2018 09/05/2018 09/03/2018  Falls in the past year? Yes No No No No  Number falls in past yr: 2 or more - - - -  Injury with Fall? No - - - -  Comment - - - - -    Relevant past medical, surgical, family and social history reviewed Past Medical History:  Diagnosis Date  . 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 Biliary dyskinesia    a.08/2018 s/p lap chole.  [name] BPH (benign prostatic hyperplasia)   . Coronary artery calcification seen on CT scan    a. 11/2015 Cardiac CT: Cor Ca2+ = 892 (80th %'ile). Diffuse Ca2+ in all 3 major coronary arteries; b. 02/2016 MV: HTN response to  exercise. Abnl ecg w/ horizontal inflat ST depression, but no ischemia/infarct on imaging.  . Dizziness   . Hyperlipidemia   . Hypertension    For irregular heart beat  . Irregular heartbeat/Palpitations    a. Has been taking Atenolol for 20 years   Past Surgical History:  Procedure Laterality Date  . CHOLECYSTECTOMY N/A 09/13/2018   Procedure: LAPAROSCOPIC CHOLECYSTECTOMY;  Surgeon: Stephen Husbands, MD;  Location: [facility] ORS;  Service: General;  Laterality: N/A;  . COLONOSCOPY  2008  . ESOPHAGOGASTRODUODENOSCOPY (EGD) WITH PROPOFOL N/A 08/20/2018   Procedure: ESOPHAGOGASTRODUODENOSCOPY (EGD) WITH PROPOFOL;  Surgeon: Stephen Lame, MD;  Location[facility] ENDOSCOPY;  Service: Endoscopy;  Laterality: N/A;  . TONSILLECTOMY AND ADENOIDECTOMY  1955   Family History  Problem Relation Age of Onset  . Cancer Mother        lung  . Heart disease Father   . Hearing loss Sister        coclear implant  . Hearing loss Brother   . Cancer Paternal Grandmother   . Cancer Son        colon  cancer  . Colon cancer Neg Hx   . Stomach cancer Neg Hx    Social History   Tobacco Use  . Smoking status: Former Smoker    Last attempt to quit: 06/22/1992    Years since quitting: 26.3  . Smokeless tobacco: Former Systems developer    Quit date: 06/22/1992  Substance Use Topics  . Alcohol use: Yes    Alcohol/week: 1.0 standard drinks    Types: 1 Cans of beer per week    Comment: occasional  . Drug use: No     [facility] Visit from 10/12/2018 in [facility]  AUDIT-C Score  0      Interim medical history since last visit reviewed. Allergies and medications reviewed  Review of Systems Per HPI unless specifically indicated above     Objective:    BP 128/70   Pulse 85   Temp 97.9 F (36.6 C)   Ht 5\' 10"  (1.778 m)   Wt 189 lb 12.8 oz (86.1 kg)   SpO2 97%   BMI 27.23 kg/m   Wt Readings from Last 3 Encounters:  10/12/18 189 lb 12.8 oz (86.1 kg)  09/25/18 190 lb 8 oz (86.4 kg)  09/24/18 188 lb 8 oz (85.5 kg)    Physical Exam  Constitutional: He appears well-developed and well-nourished. No distress.  HENT:  Head: Normocephalic and atraumatic.  Eyes: EOM are normal. No scleral icterus.  Neck: No thyromegaly present.  Cardiovascular: Normal rate and regular rhythm.  Pulmonary/Chest: Effort normal and breath sounds normal.  Abdominal: Soft. Bowel sounds are normal. He exhibits no distension.  Musculoskeletal: He exhibits no edema.  Neurological: He is alert. Coordination normal.  Skin: Skin is warm and dry. No pallor.  Psychiatric: He has a normal mood and affect. His behavior is normal. Judgment and thought content normal.    Results for orders placed or performed during the hospital encounter of 09/13/18  Surgical pathology  Result Value Ref Range   SURGICAL PATHOLOGY      Surgical Pathology CASE: [id number] PATIENT: Stephen Stein Surgical Pathology Report     SPECIMEN SUBMITTED: A. Gallbladder  CLINICAL HISTORY: None provided  PRE-OPERATIVE  DIAGNOSIS: Biliary Dyskinesia  POST-OPERATIVE DIAGNOSIS: Same as pre op     DIAGNOSIS: A.  GALLBLADDER: CHOLECYSTECTOMY: - MILD CHRONIC CHOLECYSTITIS - NEGATIVE FOR DYSPLASIA AND MALIGNANCY.   GROSS DESCRIPTION: A. Labeled: Gallbladder Received: Formalin  Size of specimen: 9.5 x 3.0 x 1.1 cm Previously opened: Yes, there are 2 focal transmural defects (grossly consistent with surgical defects) ranging from 0.4 to 0.8 cm in greatest dimension External surface: Tan-green, smooth, and finely vascular with multifocal areas of adherent adipose tissue Wall thickness: 0.2 cm (uniform) Mucosa: Tan-green and velvety with no abnormalities or polyps grossly identified Stones present: Absent Other findings: Cystic duct surgical resection margin inked blue.  No lymph node is present.  Gr een bile present in lumen.  Block summary: 1 - cystic duct surgical resection margin (en face) and gallbladder wall   Final Diagnosis performed by Bryan Lemma, MD.   Electronically signed 09/17/2018 8:44:54PM The electronic signature indicates that the named Attending Pathologist has evaluated the specimen  Technical component performed at [facility], [address] [place], [place] [address] Lab: [phone] Dir: Rush Farmer, MD, MMM  Professional component performed at [facility], [facility], [place], [place], [place] [address] Lab: [phone] Dir: Dellia Nims. Rubinas, MD       Assessment & Plan:   Problem List Items Addressed This Visit      Cardiovascular and Mediastinum   Left bundle branch block    Reviewed EKG, he is having a stress test Wednesday, followed by Dr. Rockey Stein      Essential hypertension (Chronic)    controlled        Other   Memory impairment - Primary    See the 6-CIT; suggested referral to neurologist; will check B12 and TSH      Relevant Orders   Ambulatory referral to Neurology   TSH   Hx of major depression    Appreciated patient's  honesty with his bout of depression while he was so sick; if he ever returns to that dark place, please seek help right away; he agrees; discussion with wife present; no medicines started today; PHQ score reviewed       Other Visit Diagnoses    Abnormal laboratory test       Relevant Orders   ACTH   Cortisol   Vitamin B12   TSH       Follow up plan: No follow-ups on file.  An after-visit summary was printed and given to the patient at [facility] [facility].  Please see the patient instructions which may contain other information and recommendations beyond what is mentioned above in the assessment and plan.  No orders of the defined types were placed in this encounter.   Orders Placed This Encounter  Procedures  . ACTH  . Cortisol  . Vitamin B12  . TSH  . Ambulatory referral to Neurology

## 2018-10-12 NOTE — Assessment & Plan Note (Signed)
Appreciated patient's honesty with his bout of depression while he was so sick; if he ever returns to that dark place, please seek help right away; he agrees; discussion with wife present; no medicines started today; PHQ score reviewed

## 2018-10-12 NOTE — Patient Instructions (Signed)
We'll have you see the neurologist Please do have early morning labs done in the next week or so (BEFORE 8 am)

## 2018-10-15 ENCOUNTER — Encounter: Payer: Self-pay | Admitting: Family Medicine

## 2018-10-15 ENCOUNTER — Other Ambulatory Visit
Admission: RE | Admit: 2018-10-15 | Discharge: 2018-10-15 | Disposition: A | Discharge status: Home / Self Care | Payer: PPO | Source: Ambulatory Visit | Attending: Family Medicine | Admitting: Family Medicine

## 2018-10-15 DIAGNOSIS — R899 Unspecified abnormal finding in specimens from other organs, systems and tissues: Secondary | ICD-10-CM | POA: Diagnosis not present

## 2018-10-15 DIAGNOSIS — E538 Deficiency of other specified B group vitamins: Secondary | ICD-10-CM | POA: Insufficient documentation

## 2018-10-15 DIAGNOSIS — R413 Other amnesia: Secondary | ICD-10-CM | POA: Diagnosis not present

## 2018-10-15 LAB — VITAMIN B12: VITAMIN B 12: 320 pg/mL (ref 180–914)

## 2018-10-15 LAB — TSH: TSH: 2.331 u[IU]/mL (ref 0.350–4.500)

## 2018-10-15 LAB — CORTISOL: Cortisol, Plasma: 11.6 ug/dL

## 2018-10-16 LAB — ACTH: C206 ACTH: 38 pg/mL (ref 7.2–63.3)

## 2018-10-17 ENCOUNTER — Encounter
Admission: RE | Admit: 2018-10-17 | Discharge: 2018-10-17 | Disposition: A | Discharge status: Home / Self Care | Payer: PPO | Source: Ambulatory Visit | Attending: Nurse Practitioner | Admitting: Nurse Practitioner

## 2018-10-17 DIAGNOSIS — R931 Abnormal findings on diagnostic imaging of heart and coronary circulation: Secondary | ICD-10-CM

## 2018-10-17 DIAGNOSIS — I447 Left bundle-branch block, unspecified: Secondary | ICD-10-CM | POA: Insufficient documentation

## 2018-10-17 DIAGNOSIS — R42 Dizziness and giddiness: Secondary | ICD-10-CM | POA: Diagnosis not present

## 2018-10-17 DIAGNOSIS — I951 Orthostatic hypotension: Secondary | ICD-10-CM | POA: Diagnosis not present

## 2018-10-17 LAB — NM MYOCAR MULTI W/SPECT W/WALL MOTION / EF
CHL CUP NUCLEAR SSS: 0
CHL CUP RESTING HR STRESS: 69 {beats}/min
CSEPHR: 77 %
LV dias vol: 84 mL (ref 62–150)
LV sys vol: 30 mL
NUC STRESS TID: 0.88
Peak HR: 111 {beats}/min
SDS: 0
SRS: 12

## 2018-10-17 MED ORDER — TECHNETIUM TC 99M TETROFOSMIN IV KIT
32.0500 | PACK | Freq: Once | INTRAVENOUS | Status: AC | PRN
  Administered 2018-10-17: 32.05 via INTRAVENOUS

## 2018-10-17 MED ORDER — REGADENOSON 0.4 MG/5ML IV SOLN
0.4000 mg | Freq: Once | INTRAVENOUS | Status: AC
  Administered 2018-10-17: 0.4 mg via INTRAVENOUS
  Filled 2018-10-17: qty 5

## 2018-10-17 MED ORDER — TECHNETIUM TC 99M TETROFOSMIN IV KIT
10.9000 | PACK | Freq: Once | INTRAVENOUS | Status: AC | PRN
  Administered 2018-10-17: 10.9 via INTRAVENOUS

## 2018-10-18 ENCOUNTER — Encounter: Payer: Self-pay | Admitting: Family Medicine

## 2018-10-18 ENCOUNTER — Telehealth: Payer: Self-pay | Admitting: *Deleted

## 2018-10-18 NOTE — Telephone Encounter (Signed)
No answer. Left detail message with results, ok per DPR, and to call back if any questions. Also, released to [place].

## 2018-10-18 NOTE — Telephone Encounter (Signed)
-----   Message from Theora Gianotti, NP sent at 10/18/2018  7:47 AM EDT ----- Low risk stress test.  Heart squeezing function appears normal.  Good news.

## 2018-10-19 ENCOUNTER — Ambulatory Visit (INDEPENDENT_AMBULATORY_CARE_PROVIDER_SITE_OTHER): Payer: PPO | Admitting: *Deleted

## 2018-10-19 VITALS — BP 104/70 | HR 78 | Ht 70.0 in | Wt 189.5 lb

## 2018-10-19 DIAGNOSIS — I951 Orthostatic hypotension: Secondary | ICD-10-CM

## 2018-10-19 MED ORDER — FLUDROCORTISONE ACETATE 0.1 MG PO TABS: 0.1000 mg | ORAL_TABLET | Freq: Every day | ORAL | 3 refills | Status: DC

## 2018-10-19 NOTE — Progress Notes (Addendum)
1.) Reason for visit: BP check  2.) Name of MD requesting visit: Thressa Sheller, NP  3.) H&P:  Patient was seen in clinic on 09/25/18 for follow up related to dizziness. He had had a recent cholecystectomy just prior to his follow up appointment and a 36 lb weight loss. He was on atenolol at his office visit and this was stopped due to hypotension, orthostasis, and persistent dizziness. It was mentioned that he had a LBBB that was diagnosed in July 2019. The patient was asked to come in today for a nurse visit to follow up on his orthostatic BP readings.   4.) ROS related to problem: The patient reports that he is still having symptoms of dizziness. He cannot tell a real change in this since stopping atenolol. He confirms that he is not taking this. I have removed it from his medication list today. His nausea has improved. He does confirm ~ a 37 lbs weight loss over the last month or so. He is being compliant with his compression socks during his waking hours. He is concerned today about the diagnosis of the LBBB and asked me several times what the treatment would be for that and "do I have one of the worst kinds of electrical problems." BP (HR) today are 104/70 (78). Orthostatic VS recorded in the patient's chart, but he did not have a significant drop in his BP with positional changes.   5.) Assessment and plan per MD: I have advised that his dizzinesss may be due to his weight loss and low BP's at this point. I have encouraged him to continue to wear his compression socks, maintain adequate hydration,and increase salt as tolerated. He is aware that I will forward his BP readings to Ignacia Bayley, NP and Dr. Rockey Situ and we will call him back with any further recommendations. He had a recent echo and Lexiscan myoview done as well. He is aware Dr. Rockey Situ can discuss his LBBB further with him at his office visit on 10/30/18. He voices understanding and is agreeable.    **Chart reviewed.  For orthostatic symptoms,  please add florinef 0.1mg  daily.  Please offer reassurance re: LBBB.  We obtained echo and stress test to look for any significant underlying causes of LBBB, but overall, are pleased with the results.  Keep f/u with Dr. Rockey Situ 11/5.    Murray Hodgkins, NP 10/19/2018, 11:30 AM    - I called and spoke with the patient regarding Ignacia Bayley, NP's recommendations. He is agreeable with starting florinef 0.1 mg once daily for low blood pressures. Reassurance offered regarding LBBB. The patient was very appreciative for the call back.

## 2018-10-19 NOTE — Telephone Encounter (Signed)
See mychart note and my response; help pt please

## 2018-10-19 NOTE — Addendum Note (Signed)
Addended by: Alvis Lemmings C on: 10/19/2018 05:09 PM   Modules accepted: Orders

## 2018-10-20 ENCOUNTER — Other Ambulatory Visit: Payer: Self-pay | Admitting: Family Medicine

## 2018-10-24 MED ORDER — MIDODRINE HCL 10 MG PO TABS: 5.0000 mg | ORAL_TABLET | Freq: Three times a day (TID) | ORAL | 5 refills | Status: DC

## 2018-10-24 NOTE — Telephone Encounter (Signed)
We can send in prescription for midodrine 10 mg 3 times daily  Would initially start with 5 mg 3 times daily (break the pill in half)  Would take the pill when he first wakes up, noon, 5 PM  Try not to lay down when he is on the pill, keep head inclined  Need blood pressure measurements from home, orthostatics, on the medication  thx  TG

## 2018-10-28 NOTE — Progress Notes (Signed)
Cardiology Office Note  Date:  10/30/2018   ID:  Stephen Stein, DOB [birth year], MRN [id number]  PCP:  Arnetha Courser, MD   Chief Complaint  Patient presents with  . other    F/U echo, dizziness 1 mo follow up.Medications reviewed verbally.     HPI:  Mr. Stephen Stein is a very pleasant 77 -year-old gentleman with  smoking history, stopped 20 years ago,  hyperlipidemia,  palpitations, treated with atenolol,   CT coronary calcium score with score  800,  stress testing showing no significant ischemia  who presents for routine follow-up Of his coronary artery disease   Recent imaging studies reviewed with him Echocardiogram October 09, 2018 Ejection fraction 45 to 50%, moderate LVH  Stress test October 17, 2018 No significant ischemia, fraction estimated 65% Left bundle branch block  Seen in clinic 09/25/2018: dizziness Dizzy 3 months ago 37 pound weight loss for N/V, diarrhea,  BP drop, mildly better on midorine 5 mg TID Worked for about 1 day and then stopped working it would seem Some dizzy supine, Much worse sitting and standing  Previously very active Was traveling, hiking Denies any significant chest pain or shortness of breath on exertion He thinks he might have dementia, memory is not quite what it used to be  Previously on Crestor 20 mg daily, now no longer on his list Atenolol also held  EKG personally reviewed by myself on todays visit Shows normal sinus rhythm with rate 58 bpm left bundle branch block  Orthostatics done today heart rate 54 rising up to 80 with standing after 3 minutes Blood pressure 118/68 supine down to 109/66 sitting standing around 108/60 standing even after 3 minutes Felt lightheaded with sitting  Other past medical history reviewed CT coronary calcium score, 800 He has three-vessel coronary artery disease, no significant aortic atherosclerosis  Previous stress test showing no significant ischemia.  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  PSH:    Past Surgical History:  Procedure Laterality Date  . CHOLECYSTECTOMY N/A 09/13/2018   Procedure: LAPAROSCOPIC CHOLECYSTECTOMY;  Surgeon: Jules Husbands, MD;  Location: [facility] ORS;  Service: General;  Laterality: N/A;  . COLONOSCOPY  2008  . ESOPHAGOGASTRODUODENOSCOPY (EGD) WITH PROPOFOL N/A 08/20/2018   Procedure: ESOPHAGOGASTRODUODENOSCOPY (EGD) WITH PROPOFOL;  Surgeon: Lucilla Lame, MD;  Location: [facility] ENDOSCOPY;  Service: Endoscopy;  Laterality: N/A;  . TONSILLECTOMY AND ADENOIDECTOMY  1955    Current Outpatient Medications  Medication Sig Dispense Refill  . Cyanocobalamin (VITAMIN B-12 PO) Take 1 tablet by mouth once daily    . midodrine (PROAMATINE) 10 MG tablet Take 0.5 tablets (5 mg total) by mouth 3 (three) times daily. 30 tablet 5  . rosuvastatin (CRESTOR) 10 MG tablet Take 10 mg by mouth daily.     No current facility-administered medications for this visit.      Allergies:   Patient has no known allergies.   Social History:  The patient  reports that he quit  smoking about 26 years ago. He quit smokeless tobacco use about 26 years ago. He reports that he drinks about 1.0 standard drinks of alcohol per week. He reports that he does not use drugs.   Family History:   family history includes Cancer in his mother, paternal grandmother, and son;  Hearing loss in his brother and sister; Heart disease in his father.    Review of Systems: Review of Systems  Constitutional: Negative.   Respiratory: Negative.   Cardiovascular: Negative.   Gastrointestinal: Negative.   Musculoskeletal: Negative.   Neurological: Positive for dizziness.  Psychiatric/Behavioral: Negative.   All other systems reviewed and are negative.    PHYSICAL EXAM: VS:  BP 126/60 (BP Location: Left Arm, Patient Position: Sitting, Cuff Size: Normal)   Pulse (!) 58   Ht 5\' 10"  (1.778 m)   Wt 189 lb 12 oz (86.1 kg)   BMI 27.23 kg/m  , BMI Body mass index is 27.23 kg/m. Constitutional:  oriented to person, place, and time. No distress.  HENT:  Head: Grossly normal Eyes:  no discharge. No scleral icterus.  Neck: No JVD, no carotid bruits  Cardiovascular: Regular rate and rhythm, no murmurs appreciated Pulmonary/Chest: Clear to auscultation bilaterally, no wheezes or rails Abdominal: Soft.  no distension.  no tenderness.  Musculoskeletal: Normal range of motion Neurological:  normal muscle tone. Coordination normal. No atrophy Skin: Skin warm and dry Psychiatric: normal affect, pleasant  Recent Labs: 08/19/2018: Hemoglobin 14.8; Platelets 190 09/05/2018: ALT 21; BUN 10; Creat 1.13; Potassium 3.7; Sodium 136 10/15/2018: TSH 2.331    Lipid Panel Lab Results  Component Value Date   CHOL 95 08/07/2018   HDL 33 (L) 08/07/2018   LDLCALC 45 08/07/2018   TRIG 90 08/07/2018      Wt Readings from Last 3 Encounters:  10/30/18 189 lb 12 oz (86.1 kg)  10/19/18 189 lb 8 oz (86 kg)  10/12/18 189 lb 12.8 oz (86.1 kg)       ASSESSMENT AND PLAN:  sick sinus syndrome/eft bundle branch block  second-degree AV block type I, is asymptomatic Left bundle branch block Stress test no ischemia Echocardiogram mildly depressed ejection fraction likely secondary to left bundle  Mixed hyperlipidemia - Plan: EKG 12-Lead Off Crestor for now  Essential hypertension -  Plan: EKG 12-Lead Having orthostasis symptoms Heart rate up 50-80 with standing maintaining low pressure 163 systolic but still dizzy Tolerating midodrine 5mg   3 times daily but still symptomatic Recommended he increase midodrine up to 10 mg 3 times daily  Coronary artery disease involving native coronary artery of native heart without angina pectoris - Plan: EKG 12-Lead Recent stress test with no significant ischemia  Dizziness Recent comp gated history with 37 pound weight loss since his last clinic visit July 2019 He attributes this to nausea vomiting in the setting of gallbladder disease Around that time dizziness started,  prior to general anesthesia for surgery MRI brain normal but no contrast If unable to get his blood pressure up enough with midodrine 10 3 times a day may have to also add Florinef   Total encounter time more than 25 minutes  Greater than 50% was spent in counseling and coordination of care with the patient   Disposition:   F/U  3 months   Orders Placed This Encounter  Procedures  . EKG 12-Lead     Signed, Esmond Plants, M.D., Ph.D. 10/30/2018  [place], [place]

## 2018-10-29 ENCOUNTER — Telehealth: Payer: Self-pay | Admitting: Cardiovascular Disease

## 2018-10-29 NOTE — Telephone Encounter (Signed)
Called patient to remind him of appointment with Dr. Rockey Situ tomorrow Patient states that he has had his gallbladder removed and would like to know if there is a drug that can be prescribed to help with processing foods Please call to discuss

## 2018-10-29 NOTE — Telephone Encounter (Signed)
I left a message on the patient's voice mail that he will need to touch base with his PCP about a medication to help with digestion.

## 2018-10-30 ENCOUNTER — Encounter: Payer: Self-pay | Admitting: Cardiovascular Disease

## 2018-10-30 ENCOUNTER — Ambulatory Visit (INDEPENDENT_AMBULATORY_CARE_PROVIDER_SITE_OTHER): Payer: PPO | Admitting: Cardiovascular Disease

## 2018-10-30 VITALS — BP 126/60 | HR 58 | Ht 70.0 in | Wt 189.8 lb

## 2018-10-30 DIAGNOSIS — I25118 Atherosclerotic heart disease of native coronary artery with other forms of angina pectoris: Secondary | ICD-10-CM

## 2018-10-30 DIAGNOSIS — I700 Atherosclerosis of aorta: Secondary | ICD-10-CM | POA: Diagnosis not present

## 2018-10-30 DIAGNOSIS — I10 Essential (primary) hypertension: Secondary | ICD-10-CM

## 2018-10-30 DIAGNOSIS — I951 Orthostatic hypotension: Secondary | ICD-10-CM | POA: Diagnosis not present

## 2018-10-30 DIAGNOSIS — E782 Mixed hyperlipidemia: Secondary | ICD-10-CM | POA: Diagnosis not present

## 2018-10-30 DIAGNOSIS — I441 Atrioventricular block, second degree: Secondary | ICD-10-CM | POA: Diagnosis not present

## 2018-10-30 MED ORDER — MIDODRINE HCL 10 MG PO TABS: 10.0000 mg | ORAL_TABLET | Freq: Three times a day (TID) | ORAL | 3 refills | Status: DC

## 2018-10-30 NOTE — Patient Instructions (Addendum)
Medication Instructions:   Please increase the midodrine up to 10 mg three times a day You could try 5 Am, 10 Am, 3:30 pm If still dizzy,  Call the office  If you need a refill on your cardiac medications before your next appointment, please call your pharmacy.    Lab work: No new labs needed   If you have labs (blood work) drawn today and your tests are completely normal, you will receive your results only by: [name] MyChart Message (if you have MyChart) OR . A paper copy in the mail If you have any lab test that is abnormal or we need to change your treatment, we will call you to review the results.   Testing/Procedures: No new testing needed   Follow-Up: At [facility], you and your health needs are our priority.  As part of our continuing mission to provide you with exceptional heart care, we have created designated Provider Care Teams.  These Care Teams include your primary Cardiologist (physician) and Advanced Practice Providers (APPs -  Physician Assistants and Nurse Practitioners) who all work together to provide you with the care you need, when you need it.  . You will need a follow up appointment in 3 months .   Please call our office 2 months in advance to schedule this appointment.    . Providers on your designated Care Team:   . Murray Hodgkins, NP . Christell Faith, PA-C . Marrianne Mood, PA-C  Any Other Special Instructions Will Be Listed Below (If Applicable).  For educational health videos Log in to : [email or web address] Or : [email or web address], password : [email or web address]

## 2018-11-01 ENCOUNTER — Telehealth: Payer: Self-pay | Admitting: Cardiovascular Disease

## 2018-11-01 NOTE — Telephone Encounter (Signed)
Patient calling wanting to let us know how he is doing with him taking the Midodrine 10 mg    He took 2 tables yesterday and he felt better than when he took the 3 in one day  He states when he took it three the first day it made him dizzy.   Would just like to let us know he is doing a lot better.

## 2018-11-01 NOTE — Telephone Encounter (Signed)
Spoke with patient and he just wanted to give update that he his feeling much better since starting the midodrine. He did not have any blood pressure readings available but stated that it runs in the range of 115/90's. Instructed him to start keeping a log of his blood pressure readings which can be useful to see trends. He was appreciative for the call back and verbalized understanding of our conversation with no further questions or concerns at this time.

## 2018-11-05 ENCOUNTER — Other Ambulatory Visit: Payer: Self-pay | Admitting: Nurse Practitioner

## 2018-11-07 NOTE — Telephone Encounter (Signed)
Trazodone not on active medication list

## 2018-11-09 ENCOUNTER — Telehealth: Payer: Self-pay | Admitting: Cardiovascular Disease

## 2018-11-09 NOTE — Telephone Encounter (Signed)
STAT if patient feels like he/she is going to faint   1) Are you dizzy now? Yes, extremely  2) Do you feel faint or have you passed out? no  3) Do you have any other symptoms? lightheaded  4) Have you checked your HR and BP (record if available)? No Pt wife states she thinks his medication needs to be increased.  Pt wife states she has sent a mychart message this week already.

## 2018-11-11 NOTE — Telephone Encounter (Signed)
There is not a big drop in his blood pressure Getting somewhat concerned his dizziness episodes may be less from hypotension Before we give up on the midodrine would stay on 10 every 5 hours Would also add Florinef 0.1 mg daily to see if we can get dizziness better If no improvement may need to get neurology involved

## 2018-11-12 MED ORDER — FLUDROCORTISONE ACETATE 0.1 MG PO TABS: 0.1000 mg | ORAL_TABLET | Freq: Every day | ORAL | 5 refills | Status: DC

## 2018-11-12 NOTE — Telephone Encounter (Signed)
Called and s/w patient. He verbalized understanding of Dr Donivan Scull recommendations. He is agreeable to starting Florinef daily. He will call and let us know if this medication does not work. He is aware neurology may be needed.

## 2018-11-20 ENCOUNTER — Ambulatory Visit: Payer: PPO | Admitting: Physical Therapy

## 2018-11-20 NOTE — Progress Notes (Signed)
Cardiology Office Note Date:  11/21/2018  Patient ID:  Stephen Stein, Stephen Stein 1941/06/11, MRN [id number] PCP:  Arnetha Courser, MD  Cardiologist:  Dr. Rockey Situ, MD    Chief Complaint: Follow up  History of Present Illness: Stephen Stein is a 77 y.o. male with history of coronary artery calcifications with low risk stress testing, palpitations, prior tobacco abuse quitting approximately 20 years prior, hyperlipidemia, intractable dizziness, orthostatic hypotension, adrenal adenoma, biliary dyskinesia/cholecystitis status post cholecystectomy, and BPH who presents for follow-up of dizziness.  Patient was previously evaluated with coronary CT with calcium scoring in 11/2015 showing a calcium score of 892 placing him in the 80th percentile.  This was followed by stress testing in 02/2016 which was low risk and nonischemic.  Patient was seen in follow-up in 06/2018 and was doing well at that time.  At that time on EKG he was noted to have a new left bundle branch block and also Mobitz type I heart block.  He was asymptomatic and hemodynamically stable.  It was noted his atenolol dose had previously been reduced in the setting of bradycardia.  He was admitted to the hospital in 07/2018 with nausea and abdominal pain and was diagnosed with biliary dyskinesia and subsequently underwent cholecystectomy on 09/13/2018.  Following this, he was noted to be less active and dealing with nausea and vomiting.  When he was evaluated on 09/25/2018 his weight was noted to have been down 36 pounds since 06/2018 in the setting of poor p.o. intake.  There was also associated positional lightheadedness.  Leading up to his visit on 10/1 his atenolol was held for several weeks prior though the patient reported he had not really noted any significant improvement in his lightheadedness.  He reported that he generally felt poor in the setting of nausea related to gallbladder disease.  He ultimately decided to resume his atenolol dose on his  own in late 08/2018 as he was concerned his palpitations may return.  Upon resuming atenolol he noted worsening orthostatic lightheadedness.  He was orthostatic in the office.  It was recommended he liberalize his salt intake though the patient noted since his gallbladder issues he had a heightened sense of taste and even small amounts were noted to be very salty to him.  He was advised to wear knee-high compression socks as well as to adequately hydrate.  He was recommended to stop atenolol.  Given his symptoms and recently discovered left bundle branch block echocardiogram was recommended and performed on 10/09/2018 that showed moderate LVH, EF 45 to 50%, diffuse hypokinesis, grade 1 diastolic dysfunction, trivial aortic regurgitation, mildly dilated aortic root measuring 42 mm, trivial aortic insufficiency, RVSF was mildly reduced.  Given his echo showed a newly reduced EF he underwent Lexiscan Myoview on 10/17/2018 that showed no significant ischemia.  There was a very small region of mild perfusion defect along the distal anteroseptal wall that was improved on stress images when compared to rest images felt to be secondary to conduction abnormality/known bundle branch block.  EF was estimated at 64%.  EKG showed a known left bundle branch block.  This was a low risk scan.  Patient presented on 10/25 for RN visit and was noted to still be orthostatic.  He was started on Florinef 0.1 mg daily.  Patient sent a my chart message the next day noting no improvement in symptoms.  He was started on midodrine 5 mg 3 times daily.  Patient was last evaluated by Dr. Rockey Situ on  10/30/2018.  It was noted he had previously been very active and patient was wondering if he may have some onset of dementia as his memory was not quite what it used to be.  It was noted his heart rate increased from 54-80 with standing.  He felt lightheaded with sitting.  His midodrine was increased to 10 mg 3 times daily.  It does not appear the  patient was actively taking Florinef at that time.  Patient contacted our office on 11/01/2018 noting he was feeling much better since escalating his midodrine dose.  He did not have actual BP readings but stated it was running in the 1 teens over 90s.  Patient sent my chart message on 11/9 and 11/12 indicated he was taking midodrine 10 mg every 5 hours with still having periods of dizziness.  Patient sent my chart message on 11/13 noting he was still dizzy.  He was not having significant drops in his blood pressure.  There was concern that his dizziness episodes may not be solely from issues with hypotension.  It was recommended he continue on midodrine 10 mg every 5 hours with possibility of adding Florinef 0.1 mg daily.  Patient sent another my chart message on 11/16 continuing to note dizziness.  Appointment was scheduled for 11/21/2018.  Recent MRI of the brain without contrast on 09/26/2018 showed a normal MRI of the brain for his age.  Labs: 09/2018 - ACTH 38, cortisol 11.6, B12 320, TSH normal 08/2018 - glucose 90, serum creatinine 1.13, potassium 3.7, albumin 4.0, alkaline phosphatase 25, AST ALT normal 07/2018 - CBC unremarkable  Patient comes in with his wife today and notes over the past 2 days he has done reasonably well.  He reports he is not back to his baseline and continues to note a nearly constant lightheaded sensation though he is significantly improved.  He reports he is now able to eat and drink at levels close to his baseline prior to his gallbladder surgery.  He continues to note food does not have much of a taste.  He was able to walk his dog on 11/26 for 1.5 to 2 miles without issues.  He describes his sensation as a lightheadedness not a dizziness or room spinning sensation.  There is no associated chest pain, shortness of breath, palpitations, diaphoresis, nausea, or vomiting.  No syncope.  He has been checking his blood pressures at home with a wrist cuff with readings ranging from  the 528U systolic to 13K systolic.  He is taking midodrine 10 mg 3 times daily as well as fludrocortisone 0.1 mg daily.  He indicates he previously had a neurology appointment scheduled for mid January though he thinks he may have canceled this as he thought his symptoms would be better by then.  He is interested in revisiting this neurology appointment.  No lower extremity swelling or orthopnea.  He does note continued issues with short-term memory indicating he frequently forgets the day of the week or day of the month.  He reports he has to practice this throughout the day otherwise he will forget.  Past Medical History:  Diagnosis Date  . 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 Biliary dyskinesia    a.08/2018 s/p lap chole.  [name] BPH (benign prostatic hyperplasia)   . Coronary artery calcification seen on CT scan    a. 11/2015 Cardiac CT: Cor Ca2+ = 892 (80th %'ile). Diffuse Ca2+ in all 3 major coronary  arteries; b. 02/2016 MV: HTN response to exercise. Abnl ecg w/ horizontal inflat ST depression, but no ischemia/infarct on imaging.  . Dizziness   . Hyperlipidemia   . Hypertension    For irregular heart beat  . Irregular heartbeat/Palpitations    a. Has been taking Atenolol for 20 years    Past Surgical History:  Procedure Laterality Date  . CHOLECYSTECTOMY N/A 09/13/2018   Procedure: LAPAROSCOPIC CHOLECYSTECTOMY;  Surgeon: Jules Husbands, MD;  Location: [facility] ORS;  Service: General;  Laterality: N/A;  . COLONOSCOPY  2008  . ESOPHAGOGASTRODUODENOSCOPY (EGD) WITH PROPOFOL N/A 08/20/2018   Procedure: ESOPHAGOGASTRODUODENOSCOPY (EGD) WITH PROPOFOL;  Surgeon: Lucilla Lame, MD;  Location: [facility] ENDOSCOPY;  Service: Endoscopy;  Laterality: N/A;  . TONSILLECTOMY AND ADENOIDECTOMY  1955    Current Meds  Medication Sig  . aspirin 81 MG chewable tablet Chew 81 mg by mouth daily.  . Cyanocobalamin (VITAMIN B-12 PO) Take 1 tablet by mouth once daily  . fludrocortisone  (FLORINEF) 0.1 MG tablet Take 2 tablets (0.2 mg total) by mouth daily.  . midodrine (PROAMATINE) 10 MG tablet Take 1 tablet (10 mg total) by mouth 3 (three) times daily.  . rosuvastatin (CRESTOR) 10 MG tablet Take 10 mg by mouth daily.  . [DISCONTINUED] fludrocortisone (FLORINEF) 0.1 MG tablet Take 1 tablet (0.1 mg total) by mouth daily.    Allergies:   Patient has no known allergies.   Social History:  The patient  reports that he quit smoking about 26 years ago. He quit smokeless tobacco use about 26 years ago. He reports that he drinks about 1.0 standard drinks of alcohol per week. He reports that he does not use drugs.   Family History:  The patient's family history includes Cancer in his mother, paternal grandmother, and son; Hearing loss in his brother and sister; Heart disease in his father.  ROS:   Review of Systems  Constitutional: Positive for malaise/fatigue. Negative for chills, diaphoresis, fever and weight loss.  HENT: Negative for congestion.   Eyes: Negative for discharge and redness.  Respiratory: Negative for cough, hemoptysis, sputum production, shortness of breath and wheezing.   Cardiovascular: Negative for chest pain, palpitations, orthopnea, claudication, leg swelling and PND.  Gastrointestinal: Negative for abdominal pain, blood in stool, heartburn, melena, nausea and vomiting.  Genitourinary: Negative for hematuria.  Musculoskeletal: Negative for falls and myalgias.  Skin: Negative for rash.  Neurological: Positive for weakness. Negative for dizziness, tingling, tremors, sensory change, speech change, focal weakness and loss of consciousness.       Lightheaded  Endo/Heme/Allergies: Does not bruise/bleed easily.  Psychiatric/Behavioral: Negative for substance abuse. The patient is not nervous/anxious.   All other systems reviewed and are negative.    PHYSICAL EXAM:  VS:  BP 125/73 (BP Location: Left Arm, Patient Position: Sitting, Cuff Size: Normal)   Pulse (!)  55   Ht 5\' 10"  (1.778 m)   Wt 188 lb (85.3 kg)   BMI 26.98 kg/m  BMI: Body mass index is 26.98 kg/m.  Physical Exam  Constitutional: He is oriented to person, place, and time. He appears well-developed and well-nourished.  HENT:  Head: Normocephalic and atraumatic.  Eyes: Right eye exhibits no discharge. Left eye exhibits no discharge.  Neck: Normal range of motion. No JVD present.  Cardiovascular: Normal rate, regular rhythm, S1 normal, S2 normal and normal heart sounds. Exam reveals no distant heart sounds, no friction rub, no midsystolic click and no opening snap.  No murmur heard. Pulses:  Posterior tibial pulses are 2+ on the right side, and 2+ on the left side.  Pulmonary/Chest: Effort normal and breath sounds normal. No respiratory distress. He has no decreased breath sounds. He has no wheezes. He has no rales. He exhibits no tenderness.  Abdominal: Soft. He exhibits no distension. There is no tenderness.  Musculoskeletal: He exhibits no edema.  Neurological: He is alert and oriented to person, place, and time.  Skin: Skin is warm and dry. No cyanosis. Nails show no clubbing.  Psychiatric: He has a normal mood and affect. His speech is normal and behavior is normal. Judgment and thought content normal.     EKG:  Was ordered and interpreted by me today. Shows sinus bradycardia, 55 bpm, LBBB (known since 06/2018)  Recent Labs: 08/19/2018: Hemoglobin 14.8; Platelets 190 09/05/2018: ALT 21; BUN 10; Creat 1.13; Potassium 3.7; Sodium 136 10/15/2018: TSH 2.331  08/07/2018: Cholesterol 95; HDL 33; LDL Cholesterol (Calc) 45; Total CHOL/HDL Ratio 2.9; Triglycerides 90   CrCl cannot be calculated (Patient's most recent lab result is older than the maximum 21 days allowed.).   Wt Readings from Last 3 Encounters:  11/21/18 188 lb (85.3 kg)  10/30/18 189 lb 12 oz (86.1 kg)  10/19/18 189 lb 8 oz (86 kg)    Orthostatic vital signs: Lying: 130/74, 59 bpm Sitting: 117/64, 66  bpm Standing: 121/62, 75 bpm Standing x3-minutes: 121/77, 77 bpm  Patient indicated he was dizzy throughout all of the above  Other studies reviewed: Additional studies/records reviewed today include: summarized above  ASSESSMENT AND PLAN:  1. Intractable dizziness: Of uncertain etiology as his orthostatic vital signs are now improved.  He has an appointment with neurology to discuss this further.  Obtain outpatient cardiac monitoring as outlined below given his left bundle branch block.  Recent echo and Myoview unrevealing.  Recommend he continue to maintain adequate hydration and change positions slowly.  2. Orthostatic hypotension: Orthostatic vital signs are negative in the office today.  His home BP readings are obtained by a wrist cuff and likely erroneous.  I have advised the patient to obtain an Omron brachial BP cuff.  He has been advised to check his orthostatic blood pressures and bring these readings in for review at his next visit.  He remains on midodrine 10 mg 3 times daily.  We we will increase his fludrocortisone to 0.2 mg daily.   3. Coronary artery calcification: No symptoms of angina.  Recent Myoview without evidence of ischemia.  Remains on Crestor for primary prevention.  LDL of 45 from 07/2018.  4. Left bundle branch block: Check Zio monitor as above for possible conduction abnormality.  5. Palpitations: Not an active issue at this time.  Remains off atenolol secondary to orthostasis and sinus bradycardia.  6. Memory loss: Patient indicated that he previously had an appointment with neurology in mid January however he states he canceled this.  We called the neurology office to get this rescheduled and were told patient still has his mid January appointment.  Patient was advised of this and plans to keep this appointment.  Disposition: F/u with me at previously scheduled appointment on 12/4.   Current medicines are reviewed at length with the patient today.  The patient  did not have any concerns regarding medicines.  Signed, Christell Faith, PA-C 11/21/2018 1:00 PM     [address] [place], [place] [address] [phone]

## 2018-11-21 ENCOUNTER — Encounter: Payer: Self-pay | Admitting: Physician Assistant

## 2018-11-21 ENCOUNTER — Ambulatory Visit (INDEPENDENT_AMBULATORY_CARE_PROVIDER_SITE_OTHER): Payer: PPO

## 2018-11-21 ENCOUNTER — Ambulatory Visit (INDEPENDENT_AMBULATORY_CARE_PROVIDER_SITE_OTHER): Payer: PPO | Admitting: Physician Assistant

## 2018-11-21 VITALS — BP 125/73 | HR 55 | Ht 70.0 in | Wt 188.0 lb

## 2018-11-21 DIAGNOSIS — R42 Dizziness and giddiness: Secondary | ICD-10-CM

## 2018-11-21 DIAGNOSIS — R002 Palpitations: Secondary | ICD-10-CM | POA: Diagnosis not present

## 2018-11-21 DIAGNOSIS — I951 Orthostatic hypotension: Secondary | ICD-10-CM

## 2018-11-21 DIAGNOSIS — I447 Left bundle-branch block, unspecified: Secondary | ICD-10-CM

## 2018-11-21 DIAGNOSIS — R413 Other amnesia: Secondary | ICD-10-CM | POA: Diagnosis not present

## 2018-11-21 DIAGNOSIS — I700 Atherosclerosis of aorta: Secondary | ICD-10-CM

## 2018-11-21 MED ORDER — FLUDROCORTISONE ACETATE 0.1 MG PO TABS: 0.2000 mg | ORAL_TABLET | Freq: Every day | ORAL | 5 refills | Status: DC

## 2018-11-21 NOTE — Patient Instructions (Addendum)
Medication Instructions:  - Your physician has recommended you make the following change in your medication:   1) INCREASE florinef 0.1 mg- take 2 tablets (0.2 mg) by mouth once daily  If you need a refill on your cardiac medications before your next appointment, please call your pharmacy.   Lab work: - none ordered  If you have labs (blood work) drawn today and your tests are completely normal, you will receive your results only by: [name] MyChart Message (if you have MyChart) OR . A paper copy in the mail If you have any lab test that is abnormal or we need to change your treatment, we will call you to review the results.  Testing/Procedures: - Your physician has recommended that you wear a 14 day heart monitor (ZIO-AT).  This is a "real time" monitor that will allow [place] to see any abnormal heart rhythms that may occur while you have the monitor on. You should receive a call from the [employer]  (ZIO) company once you are enrolled as a welcome call. If you see an 800 #/ 224 # come up on your phone, please answer this.    Follow-Up: At [facility], you and your health needs are our priority.  As part of our continuing mission to provide you with exceptional heart care, we have created designated Provider Care Teams.  These Care Teams include your primary Cardiologist (physician) and Advanced Practice Providers (APPs -  Physician Assistants and Nurse Practitioners) who all work together to provide you with the care you need, when you need it. [name] as scheduled with Thurmond Butts, [place]- 11/28/18  Any Other Special Instructions Will Be Listed Below (If Applicable).  - You have been referred to : Neurology- We have called [facility] and you are already scheduled to see Dr. Manuella Ghazi on 01/09/19 at 1:30 pm.   - please obtain an Omron Brachial blood pressure monitor

## 2018-11-26 NOTE — Progress Notes (Signed)
Cardiology Office Note Date:  11/28/2018  Patient ID:  Stephen Stein, Stephen Stein 1941/12/03, MRN [id number] PCP:  Arnetha Courser, MD  Cardiologist:  Dr. Rockey Situ, MD    Chief Complaint: Follow up  History of Present Illness: Stephen Stein is a 77 y.o. male with history of coronary artery calcifications with low risk stress testing, palpitations, prior tobacco abuse quitting approximately 20 years prior, hyperlipidemia, intractable dizziness, orthostatic hypotension, adrenal adenoma, biliary dyskinesia/cholecystitis status post cholecystectomy, and BPH who presents for follow-up of dizziness.  Patient was previously evaluated with coronary CT with calcium scoring in 11/2015 showing a calcium score of 892 placing him in the 80th percentile.  This was followed by stress testing in 02/2016 which was low risk and nonischemic.  Patient was seen in follow-up in 06/2018 and was doing well at that time.  At that time on EKG he was noted to have a new left bundle branch block and also Mobitz type I heart block.  He was asymptomatic and hemodynamically stable.  It was noted his atenolol dose had previously been reduced in the setting of bradycardia.  He was admitted to the hospital in 07/2018 with nausea and abdominal pain and was diagnosed with biliary dyskinesia and subsequently underwent cholecystectomy on 09/13/2018.  Following this, he was noted to be less active and dealing with nausea and vomiting.  When he was evaluated on 09/25/2018 his weight was noted to have been down 36 pounds since 06/2018 in the setting of poor p.o. intake.  There was also associated positional lightheadedness.  Leading up to his visit on 10/1 his atenolol was held for several weeks prior though the patient reported he had not really noted any significant improvement in his lightheadedness.  He reported that he generally felt poor in the setting of nausea related to gallbladder disease.  He ultimately decided to resume his atenolol dose on his  own in late 08/2018 as he was concerned his palpitations may return.  Upon resuming atenolol he noted worsening orthostatic lightheadedness.  He was orthostatic in the office.  It was recommended he liberalize his salt intake though the patient noted since his gallbladder issues he had a heightened sense of taste and even small amounts were noted to be very salty to him.  He was advised to wear knee-high compression socks as well as to adequately hydrate.  He was recommended to stop atenolol.  Given his symptoms and recently discovered left bundle branch block echocardiogram was recommended and performed on 10/09/2018 that showed moderate LVH, EF 45 to 50%, diffuse hypokinesis, grade 1 diastolic dysfunction, trivial aortic regurgitation, mildly dilated aortic root measuring 42 mm, trivial aortic insufficiency, RVSF was mildly reduced.  Given his echo showed a newly reduced EF he underwent Lexiscan Myoview on 10/17/2018 that showed no significant ischemia.  There was a very small region of mild perfusion defect along the distal anteroseptal wall that was improved on stress images when compared to rest images felt to be secondary to conduction abnormality/known bundle branch block.  EF was estimated at 64%.  EKG showed a known left bundle branch block.  This was a low risk scan.  Patient presented on 10/25 for RN visit and was noted to still be orthostatic.  He was started on Florinef 0.1 mg daily.  Patient sent a my chart message the next day noting no improvement in symptoms.  He was started on midodrine 5 mg 3 times daily.  Patient was last evaluated by Dr. Rockey Situ on  10/30/2018.  It was noted he had previously been very active and patient was wondering if he may have some onset of dementia as his memory was not quite what it used to be.  It was noted his heart rate increased from 54-80 with standing.  He felt lightheaded with sitting.  His midodrine was increased to 10 mg 3 times daily.  It does not appear the  patient was actively taking Florinef at that time.  Patient contacted our office on 11/01/2018 noting he was feeling much better since escalating his midodrine dose.  He did not have actual BP readings but stated it was running in the 1 teens over 90s.  Patient sent my chart message on 11/9 and 11/12 indicated he was taking midodrine 10 mg every 5 hours with still having periods of dizziness.  Patient sent my chart message on 11/13 noting he was still dizzy.  He was not having significant drops in his blood pressure.  There was concern that his dizziness episodes may not be solely from issues with hypotension.  It was recommended he continue on midodrine 10 mg every 5 hours with possibility of adding Florinef 0.1 mg daily.  Patient sent another my chart message on 11/16 continuing to note dizziness.  Appointment was scheduled for 11/21/2018.  Recent MRI of the brain without contrast on 09/26/2018 showed a normal MRI of the brain for his age.  Labs: 09/2018 - ACTH 38, cortisol 11.6, B12 320, TSH normal 08/2018 - glucose 90, serum creatinine 1.13, potassium 3.7, albumin 4.0, alkaline phosphatase 25, AST ALT normal 07/2018 - CBC unremarkable  Patient was seen in follow up of the above on 11/21/2018 and noted over the prior 2 days he had done well. He felt like he was not back to his baseline, though was significantly improved. His appetite was better and he was able to walk his dog on 11/26 for 1.5 to 2 miles without issues. It was noted he had been checking his BP at home with a wrist cuff. He was advised to pick up an Omron brachial BP cuff. His Florinef was increased to 0.2 mg daily and he was continued on midodrine 10 mg tid. Zio monitor was applied, with results pending at this time. He was advised to keep his appointment with neurology given his lightheadedness and recent memory loss concerns.   He comes in accompanied with his wife today and notes on 12/3 he had the best day he has had in several  months.  He states he was able to go outdoors and do some yard work as well as work out in his shop without any symptoms.  He continues to note daily improvements while taking midodrine 10 mg 3 times daily and fludrocortisone 0.2 mg daily.  He does report having increased lightheadedness and just a sense of feeling "poorly" on 12/2 though that was his only setback since he was last seen.  He has picked up an Omron brachial blood pressure cuff with blood pressure readings typically in the 1 teens to 546F systolic over 68L to 27N diastolic.  Heart rate has ranged from the 40s to 70s bpm.  He still does have some drops in his blood pressure though this is significantly improved when compared to prior readings.  Overall, he is very pleased with his improvement.  He does have a an appointment with neurology in mid January.  He continues to note issues with memory loss.  The patient did suffer a mechanical fall on 12/3  as he was stepping off of a stepladder in his backyard while he was trying to feed some birds and missed a step.  He fell on top of a picnic table.  He did not hit his head or suffer LOC.  He does not have any lingering pain from this fall.  Past Medical History:  Diagnosis Date  . 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 Biliary dyskinesia    a.08/2018 s/p lap chole.  [name] BPH (benign prostatic hyperplasia)   . Coronary artery calcification seen on CT scan    a. 11/2015 Cardiac CT: Cor Ca2+ = 892 (80th %'ile). Diffuse Ca2+ in all 3 major coronary arteries; b. 02/2016 MV: HTN response to exercise. Abnl ecg w/ horizontal inflat ST depression, but no ischemia/infarct on imaging.  . Dizziness   . Hyperlipidemia   . Hypertension    For irregular heart beat  . Irregular heartbeat/Palpitations    a. Has been taking Atenolol for 20 years    Past Surgical History:  Procedure Laterality Date  . CHOLECYSTECTOMY N/A 09/13/2018   Procedure: LAPAROSCOPIC CHOLECYSTECTOMY;   Surgeon: Jules Husbands, MD;  Location: [facility] ORS;  Service: General;  Laterality: N/A;  . COLONOSCOPY  2008  . ESOPHAGOGASTRODUODENOSCOPY (EGD) WITH PROPOFOL N/A 08/20/2018   Procedure: ESOPHAGOGASTRODUODENOSCOPY (EGD) WITH PROPOFOL;  Surgeon: Lucilla Lame, MD;  Location: [facility] ENDOSCOPY;  Service: Endoscopy;  Laterality: N/A;  . TONSILLECTOMY AND ADENOIDECTOMY  1955    Current Meds  Medication Sig  . aspirin 81 MG chewable tablet Chew 81 mg by mouth daily.  . Cyanocobalamin (VITAMIN B-12 PO) Take 1 tablet by mouth once daily  . fludrocortisone (FLORINEF) 0.1 MG tablet Take 2 tablets (0.2 mg total) by mouth daily.  . midodrine (PROAMATINE) 10 MG tablet Take 1 tablet (10 mg total) by mouth 3 (three) times daily.  . rosuvastatin (CRESTOR) 10 MG tablet Take 10 mg by mouth daily.    Allergies:   Patient has no known allergies.   Social History:  The patient  reports that he quit smoking about 26 years ago. He quit smokeless tobacco use about 26 years ago. He reports that he drinks about 1.0 standard drinks of alcohol per week. He reports that he does not use drugs.   Family History:  The patient's family history includes Cancer in his mother, paternal grandmother, and son; Hearing loss in his brother and sister; Heart disease in his father.  ROS:   Review of Systems  Constitutional: Positive for malaise/fatigue. Negative for chills, diaphoresis, fever and weight loss.  HENT: Negative for congestion.   Eyes: Negative for discharge and redness.  Respiratory: Negative for cough, hemoptysis, sputum production, shortness of breath and wheezing.   Cardiovascular: Negative for chest pain, palpitations, orthopnea, claudication, leg swelling and PND.  Gastrointestinal: Negative for abdominal pain, blood in stool, heartburn, melena, nausea and vomiting.  Genitourinary: Negative for hematuria.  Musculoskeletal: Positive for falls. Negative for myalgias.  Skin: Negative for rash.  Neurological:  Positive for weakness. Negative for dizziness, tingling, tremors, sensory change, speech change, focal weakness and loss of consciousness.  Endo/Heme/Allergies: Does not bruise/bleed easily.  Psychiatric/Behavioral: Negative for substance abuse. The patient is not nervous/anxious.   All other systems reviewed and are negative.    PHYSICAL EXAM:  VS:  BP 128/60 (BP Location: Left Arm, Patient Position: Sitting, Cuff Size: Normal)   Pulse (!) 53   Ht 5\' 10"  (1.778 m)   Wt 190  lb 8 oz (86.4 kg)   BMI 27.33 kg/m  BMI: Body mass index is 27.33 kg/m.  Physical Exam  Constitutional: He is oriented to person, place, and time. He appears well-developed and well-nourished.  HENT:  Head: Normocephalic and atraumatic.  Eyes: Right eye exhibits no discharge. Left eye exhibits no discharge.  Neck: Normal range of motion. No JVD present.  Cardiovascular: Regular rhythm, S1 normal, S2 normal and normal heart sounds. Bradycardia present. Exam reveals no distant heart sounds, no friction rub, no midsystolic click and no opening snap.  No murmur heard. Pulses:      Posterior tibial pulses are 2+ on the right side, and 2+ on the left side.  Pulmonary/Chest: Effort normal and breath sounds normal. No respiratory distress. He has no decreased breath sounds. He has no wheezes. He has no rales. He exhibits no tenderness.  Abdominal: Soft. He exhibits no distension. There is no tenderness.  Musculoskeletal: He exhibits no edema.  Neurological: He is alert and oriented to person, place, and time.  Skin: Skin is warm and dry. No cyanosis. Nails show no clubbing.  Psychiatric: He has a normal mood and affect. His speech is normal and behavior is normal. Judgment and thought content normal.     EKG:  Was ordered and interpreted by me today. Shows sinus bradycardia, 53 bpm, LBBB (known)  Recent Labs: 08/19/2018: Hemoglobin 14.8; Platelets 190 09/05/2018: ALT 21; BUN 10; Creat 1.13; Potassium 3.7; Sodium  136 10/15/2018: TSH 2.331  08/07/2018: Cholesterol 95; HDL 33; LDL Cholesterol (Calc) 45; Total CHOL/HDL Ratio 2.9; Triglycerides 90   CrCl cannot be calculated (Patient's most recent lab result is older than the maximum 21 days allowed.).   Wt Readings from Last 3 Encounters:  11/28/18 190 lb 8 oz (86.4 kg)  11/21/18 188 lb (85.3 kg)  10/30/18 189 lb 12 oz (86.1 kg)     Other studies reviewed: Additional studies/records reviewed today include: summarized above  ASSESSMENT AND PLAN:  1. Dizziness: Continues to improve.  He does have some drops in his blood pressure with an isolated drop from lying to sitting of 20 mmHg on 12/1.  At this time, he prefers to continue to increase p.o. fluid intake as well as increase his exertion level.  He will be evaluated by neurology in January.  Await outpatient cardiac monitoring as outlined below given his left bundle branch block.  2. Orthostatic hypotension: Improving.  Blood pressure readings at home have improved with transition from a wrist cuff to an Omron brachial cuff.  He will continue midodrine 10 mg 3 times daily as well as fludrocortisone 0.2 mg daily.  He will continue to escalate his activity level.  3. Coronary artery calcification: No symptoms concerning for angina.  Recent Myoview without evidence of ischemia.  Remains on Crestor for primary prevention.  LDL of 45 from 07/2018.  4. LBBB: Zio monitor is pending to evaluate for possible conduction abnormality.   5. Palpitations: No further palpitations.  Remains off atenolol secondary to orthostasis and sinus bradycardia.  Await outpatient cardiac monitoring as above.  6. Memory loss: He will be evaluated by neurology in January.  Disposition: F/u with Dr. Rockey Situ or an APP in 6 weeks.  Current medicines are reviewed at length with the patient today.  The patient did not have any concerns regarding medicines.  Signed, Christell Faith, PA-C 11/28/2018 9:38 AM     [place] [address] [place] [address] [place], [place] [address] [phone]

## 2018-11-28 ENCOUNTER — Encounter: Payer: Self-pay | Admitting: Physician Assistant

## 2018-11-28 ENCOUNTER — Telehealth: Payer: Self-pay | Admitting: Family Medicine

## 2018-11-28 ENCOUNTER — Ambulatory Visit (INDEPENDENT_AMBULATORY_CARE_PROVIDER_SITE_OTHER): Payer: PPO | Admitting: Physician Assistant

## 2018-11-28 VITALS — BP 128/60 | HR 53 | Ht 70.0 in | Wt 190.5 lb

## 2018-11-28 DIAGNOSIS — R413 Other amnesia: Secondary | ICD-10-CM | POA: Diagnosis not present

## 2018-11-28 DIAGNOSIS — R42 Dizziness and giddiness: Secondary | ICD-10-CM

## 2018-11-28 DIAGNOSIS — R002 Palpitations: Secondary | ICD-10-CM

## 2018-11-28 DIAGNOSIS — I447 Left bundle-branch block, unspecified: Secondary | ICD-10-CM | POA: Diagnosis not present

## 2018-11-28 DIAGNOSIS — I951 Orthostatic hypotension: Secondary | ICD-10-CM

## 2018-11-28 NOTE — Patient Instructions (Signed)
Medication Instructions:  - Your physician recommends that you continue on your current medications as directed. Please refer to the Current Medication list given to you today.  If you need a refill on your cardiac medications before your next appointment, please call your pharmacy.   Lab work: - none ordered  If you have labs (blood work) drawn today and your tests are completely normal, you will receive your results only by: [name] MyChart Message (if you have MyChart) OR . A paper copy in the mail If you have any lab test that is abnormal or we need to change your treatment, we will call you to review the results.  Testing/Procedures: - none ordered  Follow-Up: At [facility], you and your health needs are our priority.  As part of our continuing mission to provide you with exceptional heart care, we have created designated Provider Care Teams.  These Care Teams include your primary Cardiologist (physician) and Advanced Practice Providers (APPs -  Physician Assistants and Nurse Practitioners) who all work together to provide you with the care you need, when you need it. [name] after January 11, 2019 with Dr. Louisa Second, PA  Any Other Special Instructions Will Be Listed Below (If Applicable). - N/A

## 2018-11-28 NOTE — Telephone Encounter (Signed)
I saw the note from cardiologist I called patient Patient has good days and bad days Overall he feels better than he did On medicine Memory issues, no erections, low BP No further GI symptoms other than just no appetite He is 189 to 190 pounds, 35 pounds down from where he started; he is eating and enjoying eating now which is a plus I'll call the neurologist he is scheduled to see in January and mention autonomic dysfunction as ddx and ask if he can be moved up I called neurologist's office, was not able to leave message

## 2018-11-29 IMAGING — NM NM HEPATO W/GB/PHARM/[PERSON_NAME]
2 series · 12 of 12 positions shown · non-contrast
Comparison: 08/18/2018

CLINICAL DATA: Nausea and vomiting for several weeks

EXAM:
NUCLEAR MEDICINE HEPATOBILIARY IMAGING WITH GALLBLADDER EF
TECHNIQUE: Sequential images of the abdomen were obtained [DATE] minutes
following intravenous administration of radiopharmaceutical. After
oral ingestion of Ensure, gallbladder ejection fraction was
determined. At 60 min, normal ejection fraction is greater than 33%.
RADIOPHARMACEUTICALS:  5.45 mCi Nc-11m  Choletec IV

[Series 1000: gallbladder ef · 4.80mm/px · 6 of 120 frames shown]
[frame 11/120]
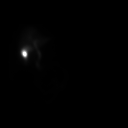
[frame 31/120]
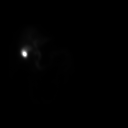
[frame 51/120]
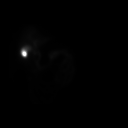
[frame 71/120]
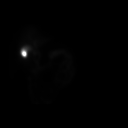
[frame 91/120]
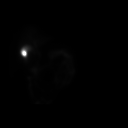
[frame 111/120]
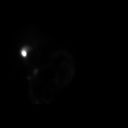

[Series 1000: hepatobiliary scan · 9.59mm/px · 6 of 60 frames shown]
[frame 6/60]
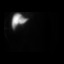
[frame 16/60]
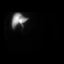
[frame 26/60]
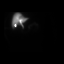
[frame 36/60]
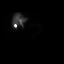
[frame 46/60]
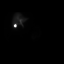
[frame 56/60]
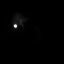

[12 of 12 positions shown; findings below may reference images not displayed]

FINDINGS: Prompt uptake and biliary excretion of activity by the liver is
seen. Gallbladder activity is visualized, consistent with patency of
cystic duct. Biliary activity passes into small bowel, consistent
with patent common bile duct.

Calculated gallbladder ejection fraction is 7%. (Normal gallbladder
ejection fraction with Ensure is greater than 33%.)
IMPRESSION: Normal uptake and excretion of biliary radiotracer.

Gallbladder ejection fraction of 7% which is severely abnormal

## 2018-12-03 NOTE — Telephone Encounter (Signed)
Message left for Dr. Manuella Ghazi to call

## 2018-12-03 NOTE — Telephone Encounter (Signed)
Please call Dr. Trena Platt office and let them know that I'd appreciate if Dr. Manuella Ghazi could call me on my cell phone about this patient; I have not been able to leave him a message through [place]

## 2018-12-04 NOTE — Telephone Encounter (Signed)
Pt.notified

## 2018-12-04 NOTE — Telephone Encounter (Signed)
I spoke with Dr. Manuella Ghazi; explained case briefly, dizziness, hypotension, memory impairment, ED, etc.; he is going to have his front staff put him on a cancellation list ----------------------------------------- Please let patient know that I spoke with Dr. Manuella Ghazi and they will put him on a cancellation list and try to get him in soon

## 2018-12-07 ENCOUNTER — Telehealth: Payer: Self-pay | Admitting: Cardiovascular Disease

## 2018-12-07 NOTE — Telephone Encounter (Signed)
Pt states he sent his heart monitor back 2 days ago and would like the result, please call to discuss. Pt states he is still somewhat lightheaded.

## 2018-12-07 NOTE — Telephone Encounter (Signed)
Spoke with patient and reviewed that monitor requires processing and that we will call him with those results when available. He verbalized understanding with no further questions at this time.

## 2018-12-11 ENCOUNTER — Encounter: Payer: PPO | Admitting: Physical Therapy

## 2018-12-12 NOTE — Telephone Encounter (Signed)
To Dr. Rockey Situ to review. Patient's monitor is currently in your in basket to review. Patient calling for results.   Thanks!

## 2018-12-12 NOTE — Telephone Encounter (Signed)
Patient calling to check status of results  

## 2018-12-14 ENCOUNTER — Encounter: Payer: Self-pay | Admitting: Family Medicine

## 2018-12-14 ENCOUNTER — Ambulatory Visit (INDEPENDENT_AMBULATORY_CARE_PROVIDER_SITE_OTHER): Payer: PPO | Admitting: Family Medicine

## 2018-12-14 DIAGNOSIS — R9431 Abnormal electrocardiogram [ECG] [EKG]: Secondary | ICD-10-CM | POA: Insufficient documentation

## 2018-12-14 DIAGNOSIS — M19012 Primary osteoarthritis, left shoulder: Secondary | ICD-10-CM

## 2018-12-14 DIAGNOSIS — M19011 Primary osteoarthritis, right shoulder: Secondary | ICD-10-CM

## 2018-12-14 DIAGNOSIS — R739 Hyperglycemia, unspecified: Secondary | ICD-10-CM | POA: Diagnosis not present

## 2018-12-14 DIAGNOSIS — E538 Deficiency of other specified B group vitamins: Secondary | ICD-10-CM

## 2018-12-14 DIAGNOSIS — E782 Mixed hyperlipidemia: Secondary | ICD-10-CM | POA: Diagnosis not present

## 2018-12-14 DIAGNOSIS — R413 Other amnesia: Secondary | ICD-10-CM

## 2018-12-14 MED ORDER — MELOXICAM 7.5 MG PO TABS: 3.7500 mg | ORAL_TABLET | Freq: Every day | ORAL | Status: DC | PRN

## 2018-12-14 MED ORDER — ROSUVASTATIN CALCIUM 10 MG PO TABS: 10.0000 mg | ORAL_TABLET | Freq: Every day | ORAL | 1 refills | Status: DC

## 2018-12-14 NOTE — Assessment & Plan Note (Signed)
Suggested PT, but he declined; he may try wall walking; low dose meloxicam okay as long as he stays well-hydrated; reviewed previous GFR, need to keep that above 60 ideally

## 2018-12-14 NOTE — Patient Instructions (Signed)
We'll have you see the cardiologist and the neurologist soon Let me know of any changes or new symptoms Stay well-hydrated

## 2018-12-14 NOTE — Assessment & Plan Note (Signed)
Continue supplementation  ?

## 2018-12-14 NOTE — Assessment & Plan Note (Signed)
He'll be seeing neurologist soon

## 2018-12-14 NOTE — Assessment & Plan Note (Signed)
I sent a message to cardiologist and they will contact patient and get him in next week

## 2018-12-14 NOTE — Assessment & Plan Note (Signed)
Reviewed glucose readings; he has not had two recent above 125 fasting, so he appears to be in impaired fasting glucose range, but NOT diabetic

## 2018-12-14 NOTE — Assessment & Plan Note (Signed)
Reviewed last lipid panel; continue crestor; next lipids in Feb/March

## 2018-12-14 NOTE — Progress Notes (Signed)
BP 122/70   Pulse 70   Temp 98.2 F (36.8 C)   Ht 5\' 10"  (1.778 m)   Wt 188 lb (85.3 kg)   SpO2 99%   BMI 26.98 kg/m    Subjective:    Patient ID: Stephen Stein, male    DOB: 09/17/1941, 77 y.o.   MRN: [id number]  HPI: Stephen Stein is a 77 y.o. male  Chief Complaint  Patient presents with  . 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); no changes in sensory Ageing more quickly in the last five months; walking like his father That memory test really hit him Father had myasthenia gravis at the end of his life; 150 pounds to 80 pounds, but came back from it; was hospitalized after falling off of a ladder, brain damage from oxygen depletion No blood relatives with dementia He is a good eater 188 pounds steady; went from 228 pounds down over this past year to 198 pounds; enjoying eating now; not like it used to be; does not like salt and sugar any more; very sensitive to anything that tastes salty; likes sugar-free drinks; sense of smell, nothing near like it was in the beginning; could detect smells more acutely at the beginning of all this; the smell of the dog and the smells of blankets and clothes were very sensitive; can go into a room and can tell things have been there; heightened sense of smell; heightened sense of taste too Paternal uncle needed a pacemaker for a slow heart rate   Abnormal Holter monitor: Patient had a min HR of 28 bpm, max HR of 174 bpm, and avg HR of 57 bpm. Predominant underlying rhythm was Sinus Rhythm. First Degree AV Block was present. Bundle Branch Block/IVCD was present. QRS morphology changes were present throughout recording. 2 Ventricular Tachycardia runs occurred, the run with the fastest interval lasting 4 beats with a max rate of 164 bpm, the longest  lasting 13 beats with an avg rate of 121 bpm. Idioventricular Rhythm was present. Second Degree AV Block-Mobitz I (Wenckebach) was present. Wenckebach was detected within +/- 45 seconds of symptomatic patient event(s). Isolated SVEs were rare (<1.0%), and no SVE Couplets or SVE Triplets were present. Isolated VEs were rare (<1.0%), VE Couplets were rare (<1.0%), and no VE Triplets were present.  Uses meloxicam at times when he has shoulder pain; generic tylenol helps some but would like Rx for meloxicam; cardiologist told him it's okay to use meloxicam; reviewed GFR which is now above 60  Vitamin B12 was mildly low in October; on supplementation for about a month Thyroid and cortisol were normal  LDL was excellent in August; on Crestor  Depression screen [facility] 2/9 12/14/2018 10/12/2018 09/05/2018 09/05/2018 08/15/2018  Decreased Interest 0 0 0 0 2  Down, Depressed, Hopeless 0 0 1 0 1  PHQ - 2 Score 0 0 1 0 3  Altered sleeping 0 - 0 - 2  Tired, decreased energy 0 - 3 - 3  Change in appetite 0 - 3 - 3  Feeling bad or failure about yourself  0 - 1 - 0  Trouble concentrating 0 - 1 - 1  Moving slowly or fidgety/restless 0 - 1 - 1  Suicidal thoughts 0 - 0 -  0  PHQ-9 Score 0 - 10 - 13  Difficult doing work/chores Not difficult at all - Somewhat difficult - Somewhat difficult   Fall Risk  12/14/2018 10/12/2018 09/24/2018 09/21/2018 09/05/2018  Falls in the past year? 0 Yes No No No  Number falls in past yr: - 2 or more - - -  Injury with Fall? - No - - -  Comment - - - - -    Relevant past medical, surgical, family and social history reviewed Past Medical History:  Diagnosis Date  . 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 Biliary dyskinesia    a.08/2018 s/p lap chole.  [name] BPH (benign prostatic hyperplasia)   . Coronary artery calcification seen on CT scan    a. 11/2015 Cardiac CT: Cor Ca2+ = 892 (80th %'ile). Diffuse Ca2+ in all 3 major coronary arteries; b.  02/2016 MV: HTN response to exercise. Abnl ecg w/ horizontal inflat ST depression, but no ischemia/infarct on imaging.  . Dizziness   . Hyperlipidemia   . Hypertension    For irregular heart beat  . Irregular heartbeat/Palpitations    a. Has been taking Atenolol for 20 years   Past Surgical History:  Procedure Laterality Date  . CHOLECYSTECTOMY N/A 09/13/2018   Procedure: LAPAROSCOPIC CHOLECYSTECTOMY;  Surgeon: Jules Husbands, MD;  Location: [facility] ORS;  Service: General;  Laterality: N/A;  . COLONOSCOPY  2008  . ESOPHAGOGASTRODUODENOSCOPY (EGD) WITH PROPOFOL N/A 08/20/2018   Procedure: ESOPHAGOGASTRODUODENOSCOPY (EGD) WITH PROPOFOL;  Surgeon: Lucilla Lame, MD;  Location: [facility] ENDOSCOPY;  Service: Endoscopy;  Laterality: N/A;  . TONSILLECTOMY AND ADENOIDECTOMY  1955   Family History  Problem Relation Age of Onset  . Cancer Mother        lung  . Heart disease Father   . Hearing loss Sister        coclear implant  . Hearing loss Brother   . Cancer Paternal Grandmother   . Cancer Son        colon cancer  . Colon cancer Neg Hx   . Stomach cancer Neg Hx    Social History   Tobacco Use  . Smoking status: Former Smoker    Last attempt to quit: 06/22/1992    Years since quitting: 26.4  . Smokeless tobacco: Former Systems developer    Quit date: 06/22/1992  Substance Use Topics  . Alcohol use: Yes    Alcohol/week: 1.0 standard drinks    Types: 1 Cans of beer per week    Comment: occasional  . Drug use: No     Office Visit from 12/14/2018 in [facility]  AUDIT-C Score  0      Interim medical history since last visit reviewed. Allergies and medications reviewed  Review of Systems Per HPI unless specifically indicated above     Objective:    BP 122/70   Pulse 70   Temp 98.2 F (36.8 C)   Ht 5\' 10"  (1.778 m)   Wt 188 lb (85.3 kg)   SpO2 99%   BMI 26.98 kg/m   Wt Readings from Last 3 Encounters:  12/14/18 188 lb (85.3 kg)  11/28/18 190 lb 8 oz (86.4 kg)    11/21/18 188 lb (85.3 kg)    Physical Exam Constitutional:      General: He is not in acute distress.    Appearance: He is well-developed.  Eyes:     General: No scleral icterus. Cardiovascular:  Rate and Rhythm: Normal rate and regular rhythm.  Pulmonary:     Effort: Pulmonary effort is normal.     Breath sounds: Normal breath sounds.  Musculoskeletal:     Right shoulder: He exhibits decreased range of motion.     Left shoulder: He exhibits decreased range of motion.  Skin:    Coloration: Skin is not pale.  Neurological:     Mental Status: He is alert.     Results for orders placed or performed during the hospital encounter of 10/15/18  TSH  Result Value Ref Range   TSH 2.331 0.350 - 4.500 uIU/mL  Vitamin B12  Result Value Ref Range   Vitamin B-12 320 180 - 914 pg/mL  Cortisol  Result Value Ref Range   Cortisol, Plasma 11.6 ug/dL  ACTH  Result Value Ref Range   C206 ACTH 38.0 7.2 - 63.3 pg/mL      Assessment & Plan:   Problem List Items Addressed This Visit      Musculoskeletal and Integument   Bilateral shoulder region arthritis    Suggested PT, but he declined; he may try wall walking; low dose meloxicam okay as long as he stays well-hydrated; reviewed previous GFR, need to keep that above 60 ideally      Relevant Medications   meloxicam (MOBIC) 7.5 MG tablet     Other   Vitamin B12 deficiency    Continue supplementation      Memory impairment    He'll be seeing neurologist soon      Hyperlipidemia    Reviewed last lipid panel; continue crestor; next lipids in Feb/March      Relevant Medications   rosuvastatin (CRESTOR) 10 MG tablet   Hyperglycemia    Reviewed glucose readings; he has not had two recent above 125 fasting, so he appears to be in impaired fasting glucose range, but NOT diabetic      Abnormal Holter exam    I sent a message to cardiologist and they will contact patient and get him in next week          Follow up  plan: Return in about 3 months (around 03/15/2019) for follow-up visit with Dr. Sanda Klein, non-fasting labs at that visit.  An after-visit summary was printed and given to the patient at [place].  Please see the patient instructions which may contain other information and recommendations beyond what is mentioned above in the assessment and plan.  Meds ordered this encounter  Medications  . rosuvastatin (CRESTOR) 10 MG tablet    Sig: Take 1 tablet (10 mg total) by mouth daily.    Dispense:  90 tablet    Refill:  1  . meloxicam (MOBIC) 7.5 MG tablet    Sig: Take 0.5-1 tablets (3.75-7.5 mg total) by mouth daily as needed for pain.    No orders of the defined types were placed in this encounter.

## 2018-12-15 NOTE — Telephone Encounter (Signed)
Event monitor Only had one patient triggered event that was not associated with significant arrhythmia. Was there no symptoms of dizziness for 2 weeks, just one triggered episode?  We did see two short runs of a ventricular tachycardia, 4 beats and 13 beats. No patient trigger with these

## 2018-12-16 NOTE — Telephone Encounter (Signed)
Please see Dr. Donivan Scull note in this phone message and result note from event monitor.

## 2018-12-17 ENCOUNTER — Telehealth: Payer: Self-pay | Admitting: *Deleted

## 2018-12-17 NOTE — Telephone Encounter (Signed)
-----   Message from Rise Mu, PA-C sent at 12/16/2018  1:30 PM EST ----- Heart monitor showed NSR with an average heart rate of 57 bpm. Minimum heart rate of 28 bpm. LBBB seen (known since 06/2018). Patient had 2 runs of NSVT vs SVT with the fastest being 4 beats with a max rate of 164 bpm and the longest lasting 13 beats with an average rate of 121 bpm. One single patient triggered event was associated Mobitiz type I. Idioventricular rhythm was present. QRS morphology was noted to change during the study.   Did patient not trigger the device or was he only symptomatic with one episode?  Patient was noted in 2016 to have multi-vessel coronary artery calcium on calcium score CT. Chest CT from 2018 showed significant coronary artery calcium of the left main, LAD, LCx, and RCA.   Given patient's symptoms of dizziness, new LBBB dating back to 06/2018, NSVT vs SVT, idioventricular rhythm, and QRS morphology changes in the setting of a low EF I recommend LHC and referral to EP.    Please schedule patient to see me this week to discuss the above. Please go ahead and refer to EP for the above. I will discuss cath risks and benefits in the office with him.   Will forward to Dr. Rockey Situ for [place].

## 2018-12-17 NOTE — Telephone Encounter (Signed)
Spoke w pt about results from monitor, pt verbalized understanding and has no further questions at this time.  Pt has f/u tomorrow 12/24 @ 8:30AM w Ignacia Bayley, NP.    Advised pt to call for any further questions or concerns

## 2018-12-17 NOTE — Telephone Encounter (Signed)
Results called to pt. Pt verbalized understanding of results and plan of care.  He is aware of appointment date and time tomorrow and the importance of coming to discuss details and ask any further questions. He was appreciative.

## 2018-12-18 ENCOUNTER — Ambulatory Visit (INDEPENDENT_AMBULATORY_CARE_PROVIDER_SITE_OTHER): Payer: PPO | Admitting: Nurse Practitioner

## 2018-12-18 ENCOUNTER — Encounter: Payer: PPO | Admitting: Physical Therapy

## 2018-12-18 ENCOUNTER — Encounter: Payer: Self-pay | Admitting: Nurse Practitioner

## 2018-12-18 VITALS — BP 128/60 | HR 54 | Ht 70.0 in | Wt 189.5 lb

## 2018-12-18 DIAGNOSIS — R42 Dizziness and giddiness: Secondary | ICD-10-CM | POA: Diagnosis not present

## 2018-12-18 DIAGNOSIS — R001 Bradycardia, unspecified: Secondary | ICD-10-CM

## 2018-12-18 DIAGNOSIS — I951 Orthostatic hypotension: Secondary | ICD-10-CM | POA: Diagnosis not present

## 2018-12-18 DIAGNOSIS — I472 Ventricular tachycardia: Secondary | ICD-10-CM

## 2018-12-18 DIAGNOSIS — I4729 Other ventricular tachycardia: Secondary | ICD-10-CM

## 2018-12-18 NOTE — Patient Instructions (Addendum)
Medication Instructions:  STOP the Midodrine  If you need a refill on your cardiac medications before your next appointment, please call your pharmacy.   Lab work: None ordered  Testing/Procedures: None ordered  Follow-Up: . Keep your follow up with Christell Faith, PA on 01/16/19  Any Other Special Instructions Will Be Listed Below (If Applicable). A referral has been placed to Dr. Caryl Comes.

## 2018-12-18 NOTE — Progress Notes (Signed)
Office Visit    Patient Name: Stephen Stein Date of Encounter: 12/18/2018  Primary Care Provider:  Arnetha Courser, MD Primary Cardiologist:  Ida Rogue, MD  Chief Complaint    77 year old male with a history of hypertension, hyperlipidemia, palpitations, coronary artery calcifications with low risk stress testing, BPH, confusion, and biliary dyskinesia/cholecystitis status post cholecystectomy, who presents for follow-up related to lightheadedness.  Past Medical History    Past Medical History:  Diagnosis Date  . 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 Biliary dyskinesia    a.08/2018 s/p lap chole.  [name] BPH (benign prostatic hyperplasia)   . Coronary artery calcification seen on CT scan    a. 11/2015 Cardiac CT: Cor Ca2+ = 892 (80th %'ile). Diffuse Ca2+ in all 3 major coronary arteries; b. 02/2016 MV: HTN response to exercise. Abnl ecg w/ horizontal inflat ST depression, but no ischemia/infarct on imaging; c. 09/2018 MV: EF 64%, no ischemia. Very sm region of mild distal antsept defect, likely 2/2 lbbb.  . Hyperlipidemia   . Hypertension    For irregular heart beat  . Irregular heartbeat/Palpitations    a. Has been taking Atenolol for 20 years  . LBBB (left bundle branch block)   . Left ventricular hypokinesis    a. 09/2018 Echo: EF 45-50%, mod LVH, diff HK. Gr1 DD. Triv AI. Mildly dil Ao root. Mildly reduced RV fxn.  . Lightheadedness   . NSVT (nonsustained ventricular tachycardia) ([place])    a. 11/2018 Zio monitor - 13 beats of NSVT--asymptomatic.  . Orthostatic hypotension    a. 11/2018 pressures improved w/ midodrine/florinef but constant lightheadedness persists.  . Sinus bradycardia    a. 11/2018 Zio monitor: Avg HR 57 (min 28). Intermittent Mobitz I.   Past Surgical History:  Procedure Laterality Date  . CHOLECYSTECTOMY N/A 09/13/2018   Procedure: LAPAROSCOPIC CHOLECYSTECTOMY;  Surgeon: Jules Husbands, MD;  Location: [facility] ORS;  Service:  General;  Laterality: N/A;  . COLONOSCOPY  2008  . ESOPHAGOGASTRODUODENOSCOPY (EGD) WITH PROPOFOL N/A 08/20/2018   Procedure: ESOPHAGOGASTRODUODENOSCOPY (EGD) WITH PROPOFOL;  Surgeon: Lucilla Lame, MD;  Location: [facility] ENDOSCOPY;  Service: Endoscopy;  Laterality: N/A;  . TONSILLECTOMY AND ADENOIDECTOMY  1955    Allergies  No Known Allergies  History of Present Illness    77 year old male with above past medical history including hypertension, hyperlipidemia, palpitations, and BPH.  He was previous evaluated coronary CT with calcium scoring in December 2016, revealing a calcium score of 892, placing him in the 80th percentile.  This was followed by stress testing March 2017, which was low risk and nonischemic.  In July of this year, he was noted to have a new left bundle branch block and Mobitz 1 heart block.  He was asymptomatic and hemodynamically stable, and beta-blocker dose was reduced.  In August 2019, he was admitted with nausea and abdominal pain and was diagnosed with biliary dyskinesia and subsequently underwent cholecystectomy September 19.  He was seen in clinic October 1 at which point he reported orthostasis and lightheadedness and blood pressure dropped to 93/65 with standing with a rise in heart rate from 66-80.  He was advised to increase salt intake as well as adequately hydrate.  Knee-high compression stockings were also recommended.  An echocardiogram was performed in mid October, showing slight reduction in LV function with an EF of 45 to 50%, diffuse hypokinesis, grade 1 diastolic dysfunction, and mildly reduced RV function.  In the  setting of mild LV dysfunction, stress testing was undertaken and Lexiscan Myoview was nonischemic.  He continued to struggle with constant lightheadedness that was initially worsened with position changes and thus he was placed on Florinef.  When symptoms did not change, he was placed on midodrine and initially he stopped Florinef but this was then added  back to his regimen as well.  Blood pressures improved but he continued to have constant lightheadedness that no longer changed with position changes.  A ZIO monitor was placed and he says that while wearing it, he had constant lightheadedness but no worsening of lightheadedness and as result, he did not have any triggers.  ZIO showed an average heart rate of 57 bpm.  Heart rate did drop to 28 following a 2.2-second pause after 10 PM.  He did have a sustained bradycardia with rates around 38 one morning at approximately 2:30 AM.  He presumes he was probably sleeping during both of those episodes.  Monitoring also showed a 13 beat run of nonsustained ventricular tachycardia.  Patient had no symptoms and says at no point did he feel like he might pass out.  Since his last visit, he has remained reasonably active, he and his wife walk their dogs for about a mile and a half 4 days a week.  They walk the dog's pace but he has never experienced chest pain or dyspnea.  He has noticed that after midodrine doses, he has been having restless legs and would like to stop it.  He denies PND, orthopnea, syncope, edema, or early satiety.  He has follow-up with neurology in January.  Home Medications    Prior to Admission medications   Medication Sig Start Date End Date Taking? Authorizing Provider  aspirin 81 MG chewable tablet Chew 81 mg by mouth daily.    [provider]  Cyanocobalamin (VITAMIN B-12 PO) Take 1 tablet by mouth once daily    [provider]  fludrocortisone (FLORINEF) 0.1 MG tablet Take 2 tablets (0.2 mg total) by mouth daily. 11/21/18   Dunn, Areta Haber, PA-C  meloxicam (MOBIC) 7.5 MG tablet Take 0.5-1 tablets (3.75-7.5 mg total) by mouth daily as needed for pain. 12/14/18   Lada, Satira Anis, MD  midodrine (PROAMATINE) 10 MG tablet Take 1 tablet (10 mg total) by mouth 3 (three) times daily. 10/30/18   Minna Merritts, MD  rosuvastatin (CRESTOR) 10 MG tablet Take 1 tablet (10 mg total)  by mouth daily. 12/14/18   Arnetha Courser, MD    Review of Systems    Constant mild lightheadedness that no longer changes with position changes.  He denies chest pain, dyspnea, palpitations, PND, orthopnea, syncope, edema, or early satiety.  He has been having shaking of his legs following midodrine doses and would like to discontinue.  All other systems reviewed and are otherwise negative except as noted above.  Physical Exam    VS:  BP 128/60 (BP Location: Left Arm, Patient Position: Sitting, Cuff Size: Normal)   Pulse (!) 54   Ht 5\' 10"  (1.778 m)   Wt 189 lb 8 oz (86 kg)   BMI 27.19 kg/m  , BMI Body mass index is 27.19 kg/m.  Orthostatic VS for the past 24 hrs:  BP- Lying Pulse- Lying BP- Sitting Pulse- Sitting BP- Standing at 0 minutes Pulse- Standing at 0 minutes  12/18/18 0840 127/64 52 127/66 60 117/68 65   GEN: Well nourished, well developed, in no acute distress. HEENT: normal. Neck: Supple, no  JVD, carotid bruits, or masses. Cardiac: RRR, no murmurs, rubs, or gallops. No clubbing, cyanosis, edema.  Radials/PT 2+ and equal bilaterally.  Respiratory:  Respirations regular and unlabored, clear to auscultation bilaterally. GI: Soft, nontender, nondistended, BS + x 4. MS: no deformity or atrophy. Skin: warm and dry, no rash. Neuro:  Strength and sensation are intact. Psych: Normal affect.  Accessory Clinical Findings    ECG personally reviewed by me today -sinus bradycardia, 54, first-degree AV block, left axis deviation, left bundle branch block- no acute changes.  Assessment & Plan    1.  Lightheadedness/orthostatic hypotension: Early in the fall, patient was diagnosed with orthostatic hypotension associated with lightheadedness with institution of conservative measures such as increasing hydration, salt intake, and lower extremity compression stockings.  When symptoms did not improve, he was subsequently placed on Florinef and later midodrine.  Despite improvement in  orthostatic blood pressures, he has seen no change in his degree of lightheadedness as this is persistent throughout every waking hour and is not impacted by position changes.  He has been having restless legs following midodrine doses and as this has not significantly changed his symptoms, he would like to come off of it which we agreed was reasonable.  He will remain on Florinef for the time being.  Recent Zio monitoring showed sinus bradycardia.  During what is presumed to be periods of sleep, he did have a 2.2-second pause with heart rate dipped to 28 and also some sustained bradycardia with rates in the 30s.  He also had asymptomatic nonsustained VT of 13 beats.  Based on the copious amounts of data that we have collected up to this point, with persistent lightheadedness throughout the day, neither his blood pressure nor heart rate are currently playing a role.  He has previous been evaluated MRI without contrast in October which was normal.  He is scheduled to follow-up with neurology next month.  2.  Sinus bradycardia: As above, Zio monitoring recently showed an average heart rate of 57 bpm with some slowing during periods of sleep.  I had him ambulate today and heart rate rose to 82.  Though he has persistent lightheadedness, this is unrelated to heart rate or rhythm.  As previously recommended, with bradycardia, Mobitz 1, and left bundle branch block, he will be evaluated by electrophysiology.  3.  Nonsustained ventricular tachycardia: During monitoring period, he had a 13 beat episode of nonsustained VT.  This was asymptomatic.  Previous echo showed EF of 45 to 50%.  Recent stress testing was nonischemic.  I discussed his case with with Dr. Rockey Situ today.  Patient has been walking on a regular basis without chest pain or dyspnea.  Though he has a history of coronary calcium, he now has 2- stress tests in the past few years.  We will hold off on any further ischemic evaluation at this point.  If after  seen by electrophysiology, it is felt that he needs a more definitive study, would consider cardiac CT angiography.  4.  Coronary artery calcification: Recent negative Myoview.  Continue aspirin and statin therapy.  5.  Memory loss/confusion: He will follow-up with neurology in January.  6.  Disposition: Follow-up with Dr. Rockey Situ in 1 month.  Referral to EP made.  Murray Hodgkins, NP 12/18/2018, 12:58 PM

## 2018-12-25 ENCOUNTER — Encounter: Payer: PPO | Admitting: Physical Therapy

## 2019-01-01 ENCOUNTER — Encounter: Payer: PPO | Admitting: Physical Therapy

## 2019-01-09 DIAGNOSIS — I10 Essential (primary) hypertension: Secondary | ICD-10-CM | POA: Diagnosis not present

## 2019-01-09 DIAGNOSIS — R413 Other amnesia: Secondary | ICD-10-CM | POA: Diagnosis not present

## 2019-01-09 DIAGNOSIS — E538 Deficiency of other specified B group vitamins: Secondary | ICD-10-CM | POA: Diagnosis not present

## 2019-01-09 DIAGNOSIS — F3289 Other specified depressive episodes: Secondary | ICD-10-CM | POA: Diagnosis not present

## 2019-01-14 NOTE — Progress Notes (Signed)
Cardiology Office Note Date:  01/16/2019  Patient ID:  Stephen, Stein [birth year], MRN [id number] PCP:  Arnetha Courser, MD  Cardiologist:  Dr. Rockey Situ, MD    Chief Complaint: Follow up  History of Present Illness: Stephen Stein is a 78 y.o. male with history of coronary artery calcifications with low risk stress testing, palpitations, prior tobacco abuse quitting approximately 20 years prior, hyperlipidemia, intractable dizziness,orthostatic hypotension, adrenal adenoma, biliary dyskinesia/cholecystitis status post cholecystectomy, and BPH who presents for follow-up of dizziness.  Patient was previously evaluated with coronary CT with calcium scoring in 11/2015 showing a calcium score of 892 placing him in the 80th percentile. This was followed by stress testing in 02/2016 which was low risk and nonischemic. Patient was seen in follow-up in 06/2018 and was doing well at that time. At that time on EKG he was noted to have a new left bundle branch block and also Mobitz type I heart block. He was asymptomatic and hemodynamically stable. It was noted his atenolol dose had previously been reduced in the setting of bradycardia. He was admitted to the hospital in 07/2018 with nausea and abdominal pain and was diagnosed with biliary dyskinesia and subsequently underwent cholecystectomy on 09/13/2018. Following this, he was noted to be less active and dealing with nausea and vomiting. When he was evaluated on 09/25/2018 his weight was noted to have been down 36 pounds since 06/2018 in the setting of poor p.o. intake. There was also associated positional lightheadedness. Leading up to his visit on 10/1 his atenolol was held for several weeks prior though the patient reported he had not really noted any significant improvement in his lightheadedness. He reported that he generally felt poor in the setting of nausea related to gallbladder disease. He ultimately decided to resume his atenolol dose on his  own in late 08/2018 as he was concerned his palpitations may return. Upon resuming atenolol he noted worsening orthostatic lightheadedness. He was orthostatic in the office. It was recommended he liberalize his salt intake though the patient noted since his gallbladder issues he had a heightened sense of taste and even small amounts were noted to be very salty to him. He was advised to wear knee-high compression socks as well as to adequately hydrate. He was recommended to stop atenolol. Given his symptoms and recently discovered left bundle branch block echocardiogram was recommended and performed on 10/09/2018 that showed moderate LVH, EF 45 to 50%, diffuse hypokinesis, grade 1 diastolic dysfunction, trivial aortic regurgitation, mildly dilated aortic root measuring 42 mm, trivial aortic insufficiency, RVSF was mildly reduced. Given his echo showed a newly reduced EF he underwent Lexiscan Myoview on 10/17/2018 that showed no significant ischemia. There was a very small region of mild perfusion defect along the distal anteroseptal wall that was improved on stress images when compared to rest images felt to be secondary to conduction abnormality/known bundle branch block. EF was estimated at 64%. EKG showed a known left bundle branch block. This was a low risk scan.  He continued to note lightheadedness/orthostatsis. In this setting, he was started on Florinef.  With this, he continued to note lightheadedness, and was placed on midodrine, and he stopped Florinef, however that was subsequently added back.  His blood pressures did improve, though he continued to note lightheadedness that was not associated with positional change.  A Zio monitor was placed, while wearing it he had constant lightheadedness, without worsening lightheadedness and thus did not trigger the monitor.  The Zio showed  an average heart rate of 57 bpm.  His heart rate did drop to 28 bpm following a 2.2 second pause after 10 PM.  He did  have a sustained bradycardia with rates around 38 bpm one morning at approximately 2:30 AM.  He presumed he was sleeping during both of these episodes.  There was also a 13 beat run of NSVT. He did not have any presyncopal symptoms.   Recent MRI of the brain without contrast on 09/26/2018 showed a normal MRI of the brain for his age.  He was last seen in the office on 12/18/2018 and remained reasonably active, walking his dogs for about a mile and a half 4 days per week. He noted some restless legs after starting midodrine and wanted to stop it.  Given the addition of midodrine did not help with his symptoms, it was discontinued.  He was advised to keep his follow up with neurology in January. He was able to ambulate in our office and achieve a heart rate of 82 bpm on 12/18/2018.  He has been referred to EP and has an appointment with them next month.  Further ischemic testing was deferred in the setting of him not having any anginal symptoms while walking his dogs and with 2 recent nonischemic stress tests.  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  He was started on Zoloft.  Patient reports neurology did review his prior imaging and questioned if he has had a prior stroke which could be contributing to his dizziness.  Patient comes in accompanied by his wife today and notes his lightheadedness seems to be somewhat worse over the past 1 to 2 weeks.  Of note, the patient self discontinued his fludrocortisone 3 weeks prior.  He is no longer walking his dogs as often as he previously was secondary to worsening of lightheadedness.  His lightheadedness is not associated with positional change.  There has been no syncope.  No shortness of breath, chest pain, palpitations, lower extremity swelling, abdominal distention, orthopnea, PND, or early satiety.  His wife notes the patient appears "apathetic."  He did suffer a  minor fall off a ladder leading to an abrasion of the right forearm.  He did not hit his head or suffer LOC.  Past Medical History:  Diagnosis Date  . 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 Biliary dyskinesia    a.08/2018 s/p lap chole.  [name] BPH (benign prostatic hyperplasia)   . Coronary artery calcification seen on CT scan    a. 11/2015 Cardiac CT: Cor Ca2+ = 892 (80th %'ile). Diffuse Ca2+ in all 3 major coronary arteries; b. 02/2016 MV: HTN response to exercise. Abnl ecg w/ horizontal inflat ST depression, but no ischemia/infarct on imaging; c. 09/2018 MV: EF 64%, no ischemia. Very sm region of mild distal antsept defect, likely 2/2 lbbb.  . Hyperlipidemia   . Hypertension    For irregular heart beat  . Irregular heartbeat/Palpitations    a. Has been taking Atenolol for 20 years  . LBBB (left bundle branch block)   . Left ventricular hypokinesis    a. 09/2018 Echo: EF 45-50%, mod LVH, diff HK. Gr1 DD. Triv AI. Mildly dil Ao root. Mildly reduced RV fxn.  . Lightheadedness   . NSVT (nonsustained ventricular tachycardia) ([place])    a. 11/2018 Zio monitor - 13 beats of NSVT--asymptomatic.  . Orthostatic  hypotension    a. 11/2018 pressures improved w/ midodrine/florinef but constant lightheadedness persists.  . Sinus bradycardia    a. 11/2018 Zio monitor: Avg HR 57 (min 28). Intermittent Mobitz I.    Past Surgical History:  Procedure Laterality Date  . CHOLECYSTECTOMY N/A 09/13/2018   Procedure: LAPAROSCOPIC CHOLECYSTECTOMY;  Surgeon: Jules Husbands, MD;  Location: [facility] ORS;  Service: General;  Laterality: N/A;  . COLONOSCOPY  2008  . ESOPHAGOGASTRODUODENOSCOPY (EGD) WITH PROPOFOL N/A 08/20/2018   Procedure: ESOPHAGOGASTRODUODENOSCOPY (EGD) WITH PROPOFOL;  Surgeon: Lucilla Lame, MD;  Location: [facility] ENDOSCOPY;  Service: Endoscopy;  Laterality: N/A;  . TONSILLECTOMY AND ADENOIDECTOMY  1955    Current Meds  Medication Sig  . sertraline (ZOLOFT) 50 MG tablet  Take 25 mg by mouth daily. Take 25 mg one tablet daily for 2 weeks.  Increase 25 mg to 50 mg one tablet daily thereafter.    Allergies:   Patient has no known allergies.   Social History:  The patient  reports that he quit smoking about 26 years ago. He quit smokeless tobacco use about 26 years ago. He reports current alcohol use of about 1.0 standard drinks of alcohol per week. He reports that he does not use drugs.   Family History:  The patient's family history includes Cancer in his mother, paternal grandmother, and son; Hearing loss in his brother and sister; Heart disease in his father.  ROS:   Review of Systems  Constitutional: Positive for malaise/fatigue. Negative for chills, diaphoresis, fever and weight loss.  HENT: Negative for congestion.   Eyes: Negative for discharge and redness.  Respiratory: Negative for cough, hemoptysis, sputum production, shortness of breath and wheezing.   Cardiovascular: Negative for chest pain, palpitations, orthopnea, claudication, leg swelling and PND.  Gastrointestinal: Negative for abdominal pain, blood in stool, heartburn, melena, nausea and vomiting.  Genitourinary: Negative for hematuria.  Musculoskeletal: Negative for falls and myalgias.  Skin: Negative for rash.  Neurological: Positive for dizziness and weakness. Negative for tingling, tremors, sensory change, speech change, focal weakness, loss of consciousness and headaches.  Endo/Heme/Allergies: Does not bruise/bleed easily.  Psychiatric/Behavioral: Negative for substance abuse. The patient is not nervous/anxious.   All other systems reviewed and are negative.    PHYSICAL EXAM:  VS:  BP 130/62 (BP Location: Left Arm, Patient Position: Sitting, Cuff Size: Normal)   Pulse 62   Ht 5\' 10"  (1.778 m)   Wt 189 lb 12 oz (86.1 kg)   BMI 27.23 kg/m  BMI: Body mass index is 27.23 kg/m.  Physical Exam  Constitutional: He is oriented to person, place, and time. He appears well-developed and  well-nourished.  HENT:  Head: Normocephalic and atraumatic.  Eyes: Right eye exhibits no discharge. Left eye exhibits no discharge.  Neck: Normal range of motion. No JVD present.  Cardiovascular: Normal rate, regular rhythm, S1 normal, S2 normal and normal heart sounds. Exam reveals no distant heart sounds, no friction rub, no midsystolic click and no opening snap.  No murmur heard. Pulses:      Posterior tibial pulses are 2+ on the right side and 2+ on the left side.  Pulmonary/Chest: Effort normal and breath sounds normal. No respiratory distress. He has no decreased breath sounds. He has no wheezes. He has no rales. He exhibits no tenderness.  Abdominal: Soft. He exhibits no distension. There is no abdominal tenderness.  Musculoskeletal:        General: No edema.  Neurological: He is alert and oriented to person, place, and  time.  Skin: Skin is warm and dry. No cyanosis. Nails show no clubbing.  Psychiatric: He has a normal mood and affect. His speech is normal and behavior is normal. Judgment and thought content normal.     EKG:  Was ordered and interpreted by me today. Shows NSR, 62 bpm with first-degree AV block, rare PAC, LBBB  Recent Labs: 08/19/2018: Hemoglobin 14.8; Platelets 190 09/05/2018: ALT 21; BUN 10; Creat 1.13; Potassium 3.7; Sodium 136 10/15/2018: TSH 2.331  08/07/2018: Cholesterol 95; HDL 33; LDL Cholesterol (Calc) 45; Total CHOL/HDL Ratio 2.9; Triglycerides 90   CrCl cannot be calculated (Patient's most recent lab result is older than the maximum 21 days allowed.).   Wt Readings from Last 3 Encounters:  01/16/19 189 lb 12 oz (86.1 kg)  12/18/18 189 lb 8 oz (86 kg)  12/14/18 188 lb (85.3 kg)    Orthostatic vital signs: Lying: 146/78, 66 bpm Sitting: 137/80, 60 bpm, dizzy Standing: 147/60, 85 bpm Standing x3 minutes: 146/89, 78 bpm  Other studies reviewed: Additional studies/records reviewed today include: summarized above  ASSESSMENT AND  PLAN:  1. Lightheadedness: Patient has had an extensive cardiac work-up that has been mostly unrevealing as his blood pressure and heart rate seem to not play a role in his lightheadedness.  He will be referred to EP for further evaluation.  We are checking to see if he can be seen sooner in [place].  We cannot exclude possible prior stroke as noted on prior brain imaging.  I will defer this to his neurologist.  I have stressed the patient should not be standing on stools or ladders with his ongoing dizziness.  Of note, since discontinuing midodrine in 11/2018 in the setting of restless legs, he continues to have restless legs.  I have recommended he discuss this further with his neurologist.  2. Orthostatic hypotension: He is not orthostatic in the office today.  His lightheadedness is not associated with positional changes.  Nonetheless, I cannot exclude his worsening lightheadedness lately being in the setting of self discontinued fludrocortisone.  In this setting, I have recommended he resume fludrocortisone 0.1 mg daily for 1 week followed by 0.1 mg twice daily thereafter.  This was his prior dose and he was doing well at that time.  Continue to push water and maintain adequate hydration.  3. NSVT: Noted during prior ZIO as outlined above.  Recent stress testing nonischemic.  Echo showed an EF of 45 to 50%.  Case has previously been discussed with his primary cardiologist and given the patient denies any exertional symptoms concerning for ischemia further ischemic evaluation has been deferred.  If after EP evaluation it is felt he needs more definitive ischemic evaluation would recommend cardiac CT.  4. Sinus bradycardia/Mobitz type I/LBBB: He is able to demonstrate appropriate chronotropic response.  He has been referred to EP as above.  5. Coronary artery calcification: No symptoms suggestive of angina.  Recent Myoview nonischemic.  Continue aspirin and statin.  6. Memory loss with mixed  dementia: Cannot exclude this playing a role in his lightheadedness.  Per neurology.  Disposition: F/u with Dr. Rockey Situ or an APP in 3 months.  We are reaching out to see if the patient can be seen by EP in [place] sooner.  Current medicines are reviewed at length with the patient today.  The patient did not have any concerns regarding medicines.  Signed, Christell Faith, PA-C 01/16/2019 9:08 AM     [address]  [place], [place] [address] [phone]

## 2019-01-16 ENCOUNTER — Encounter: Payer: Self-pay | Admitting: Physician Assistant

## 2019-01-16 ENCOUNTER — Ambulatory Visit (INDEPENDENT_AMBULATORY_CARE_PROVIDER_SITE_OTHER): Payer: PPO | Admitting: Physician Assistant

## 2019-01-16 VITALS — BP 130/62 | HR 62 | Ht 70.0 in | Wt 189.8 lb

## 2019-01-16 DIAGNOSIS — I2510 Atherosclerotic heart disease of native coronary artery without angina pectoris: Secondary | ICD-10-CM

## 2019-01-16 DIAGNOSIS — I4729 Other ventricular tachycardia: Secondary | ICD-10-CM

## 2019-01-16 DIAGNOSIS — F0150 Vascular dementia without behavioral disturbance: Secondary | ICD-10-CM | POA: Diagnosis not present

## 2019-01-16 DIAGNOSIS — G309 Alzheimer's disease, unspecified: Secondary | ICD-10-CM

## 2019-01-16 DIAGNOSIS — R42 Dizziness and giddiness: Secondary | ICD-10-CM

## 2019-01-16 DIAGNOSIS — R413 Other amnesia: Secondary | ICD-10-CM | POA: Diagnosis not present

## 2019-01-16 DIAGNOSIS — I472 Ventricular tachycardia: Secondary | ICD-10-CM | POA: Diagnosis not present

## 2019-01-16 DIAGNOSIS — R001 Bradycardia, unspecified: Secondary | ICD-10-CM | POA: Diagnosis not present

## 2019-01-16 DIAGNOSIS — I951 Orthostatic hypotension: Secondary | ICD-10-CM

## 2019-01-16 DIAGNOSIS — F0280 Dementia in other diseases classified elsewhere without behavioral disturbance: Secondary | ICD-10-CM | POA: Diagnosis not present

## 2019-01-16 DIAGNOSIS — I441 Atrioventricular block, second degree: Secondary | ICD-10-CM | POA: Diagnosis not present

## 2019-01-16 DIAGNOSIS — I447 Left bundle-branch block, unspecified: Secondary | ICD-10-CM | POA: Diagnosis not present

## 2019-01-16 MED ORDER — FLUDROCORTISONE ACETATE 0.1 MG PO TABS: 0.1000 mg | ORAL_TABLET | Freq: Two times a day (BID) | ORAL | 1 refills | Status: DC

## 2019-01-16 NOTE — Patient Instructions (Addendum)
Medication Instructions:  RESTART the Fludrocortisone (Florinef) 0.1 mg daily for one week. After one week start taking the Florinef 0.1 mg twice daily.   If you need a refill on your cardiac medications before your next appointment, please call your pharmacy.   Lab work: None ordered  Testing/Procedures: None ordered  Follow-Up: At [facility], you and your health needs are our priority.  As part of our continuing mission to provide you with exceptional heart care, we have created designated Provider Care Teams.  These Care Teams include your primary Cardiologist (physician) and Advanced Practice Providers (APPs -  Physician Assistants and Nurse Practitioners) who all work together to provide you with the care you need, when you need it. You will need a follow up appointment in 3 months. You may see Ida Rogue, MD or one of the following Advanced Practice Providers on your designated Care Team:   Murray Hodgkins, NP Christell Faith, PA-C . Marrianne Mood, PA-C   UPDATED APPOINTMENT: Dr. Virl Axe January 30th at [facility]

## 2019-01-21 ENCOUNTER — Ambulatory Visit: Payer: PPO | Admitting: Cardiovascular Disease

## 2019-01-24 ENCOUNTER — Encounter: Payer: Self-pay | Admitting: Internal Medicine

## 2019-01-24 ENCOUNTER — Ambulatory Visit: Payer: PPO | Admitting: Internal Medicine

## 2019-01-24 VITALS — BP 141/80 | HR 72 | Ht 70.0 in | Wt 187.0 lb

## 2019-01-24 DIAGNOSIS — R001 Bradycardia, unspecified: Secondary | ICD-10-CM

## 2019-01-24 DIAGNOSIS — I951 Orthostatic hypotension: Secondary | ICD-10-CM | POA: Diagnosis not present

## 2019-01-24 DIAGNOSIS — R42 Dizziness and giddiness: Secondary | ICD-10-CM | POA: Diagnosis not present

## 2019-01-24 DIAGNOSIS — I472 Ventricular tachycardia: Secondary | ICD-10-CM

## 2019-01-24 DIAGNOSIS — I4729 Other ventricular tachycardia: Secondary | ICD-10-CM

## 2019-01-24 NOTE — Progress Notes (Signed)
ELECTROPHYSIOLOGY CONSULT NOTE  Patient ID: EDGEL DEGNAN, MRN: [id number], DOB/AGE: 1941/04/28 78 y.o. Admit date: (Not on file) Date of Consult: 01/24/2019  Primary Physician: Arnetha Courser, MD Primary Cardiologist: Robert Bellow Stephen Stein is a 78 y.o. male who is being seen today for the evaluation of dizziness at the request of TG/.    HPI Stephen Stein is a 78 y.o. male who has an 6-67-month course of rapid declining cognitive status in the context of a non-positional dizziness.  Shortly after the symptoms began he was also struggling with nausea and emesis.  He was found to have gallbladder disease and underwent cholecystectomy with a modest improvement in the GI symptoms.  However, the other symptoms did not abate; indeed they have worsened.  His dizziness is non-positional.  He awakens with it.  It is not aggravated by movement nor standing nor relieved by sitting.  It often lasts most of the day.  It is associated with gait instability.  Over the same period of time he has noted progressive cognitive impairment.  He is not able to photo shop on his computer.  He is emotionally labile and this is created a great deal of challenge for him and his wife.  He is uncomfortable in her absence.  Parenthetically, she is in tears as she talks about this.  The onset of the symptoms was associated with hyper acute taste to salt and sugar as well as hypersensitivity to smell.  These have gradually abated.  He is also had new problems with significant fatigue, awakening without feeling rested.  Wife denies significant snoring.  He was seen by neurology.  MRI report was reviewed.  It describes a normal MRI for age; however, the wife says that the neurologist said he could have had a stroke.  Heart rhythm has been abnormal.  Orthostatic vital signs have been measured on numerous occasions.  The very first measurement in the summer showed a modest decrease in blood pressure.  One interval  measurement showed an increase in heart rate.  The vast majority of the numbers have been normal.  Has noted to have a slow and irregular heart rate.  DATE TEST EF   12/16 CT-Cardiac   % Diffuse Ca  10/19 Echo   45-50 %   10/19 Myoview  64% No Ischemia small defect 2/2 LBBB   ZIO monitor >> Mbz 2:1 AVB assoc with symptoms  LBBB rate related aberration  There is a second wide complex with different morphology-- ? Double aberration, first in the RB then in the second     Past Medical History:  Diagnosis Date  . 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 Biliary dyskinesia    a.08/2018 s/p lap chole.  [name] BPH (benign prostatic hyperplasia)   . Coronary artery calcification seen on CT scan    a. 11/2015 Cardiac CT: Cor Ca2+ = 892 (80th %'ile). Diffuse Ca2+ in all 3 major coronary arteries; b. 02/2016 MV: HTN response to exercise. Abnl ecg w/ horizontal inflat ST depression, but no ischemia/infarct on imaging; c. 09/2018 MV: EF 64%, no ischemia. Very sm region of mild distal antsept defect, likely 2/2 lbbb.  . Hyperlipidemia   . Hypertension    For irregular heart beat  . Irregular heartbeat/Palpitations    a. Has been taking Atenolol for 20 years  . LBBB (left bundle branch block)   . Left  ventricular hypokinesis    a. 09/2018 Echo: EF 45-50%, mod LVH, diff HK. Gr1 DD. Triv AI. Mildly dil Ao root. Mildly reduced RV fxn.  . Lightheadedness   . NSVT (nonsustained ventricular tachycardia) ([place])    a. 11/2018 Zio monitor - 13 beats of NSVT--asymptomatic.  . Orthostatic hypotension    a. 11/2018 pressures improved w/ midodrine/florinef but constant lightheadedness persists.  . Sinus bradycardia    a. 11/2018 Zio monitor: Avg HR 57 (min 28). Intermittent Mobitz I.      Surgical History:  Past Surgical History:  Procedure Laterality Date  . CHOLECYSTECTOMY N/A 09/13/2018   Procedure: LAPAROSCOPIC CHOLECYSTECTOMY;  Surgeon: Jules Husbands, MD;  Location: [facility] ORS;   Service: General;  Laterality: N/A;  . COLONOSCOPY  2008  . ESOPHAGOGASTRODUODENOSCOPY (EGD) WITH PROPOFOL N/A 08/20/2018   Procedure: ESOPHAGOGASTRODUODENOSCOPY (EGD) WITH PROPOFOL;  Surgeon: Lucilla Lame, MD;  Location: [facility] ENDOSCOPY;  Service: Endoscopy;  Laterality: N/A;  . TONSILLECTOMY AND ADENOIDECTOMY  1955     Home Meds: Current Meds  Medication Sig  . aspirin 81 MG chewable tablet Chew 81 mg by mouth daily.  . Cyanocobalamin (VITAMIN B-12 PO) Take 1 tablet by mouth once daily  . fludrocortisone (FLORINEF) 0.1 MG tablet Take 1 tablet (0.1 mg total) by mouth 2 (two) times daily.  . meloxicam (MOBIC) 7.5 MG tablet Take 0.5-1 tablets (3.75-7.5 mg total) by mouth daily as needed for pain.  . rosuvastatin (CRESTOR) 10 MG tablet Take 1 tablet (10 mg total) by mouth daily.  . sertraline (ZOLOFT) 50 MG tablet Take 25 mg by mouth daily. Take 25 mg one tablet daily for 2 weeks.  Increase 25 mg to 50 mg one tablet daily thereafter.    Allergies: No Known Allergies  Social History   Socioeconomic History  . Marital status: Married    Spouse name: Not on file  . Number of children: Not on file  . Years of education: Not on file  . Highest education level: Not on file  Occupational History  . Not on file  Social Needs  . Financial resource strain: Not hard at all  . Food insecurity:    Worry: Patient refused    Inability: Patient refused  . Transportation needs:    Medical: Patient refused    Non-medical: Patient refused  Tobacco Use  . Smoking status: Former Smoker    Last attempt to quit: 06/22/1992    Years since quitting: 26.6  . Smokeless tobacco: Former Systems developer    Quit date: 06/22/1992  Substance and Sexual Activity  . Alcohol use: Yes    Alcohol/week: 1.0 standard drinks    Types: 1 Cans of beer per week    Comment: occasional  . Drug use: No  . Sexual activity: Not Currently  Lifestyle  . Physical activity:    Days per week: 4 days    Minutes per session: 20 min    . Stress: Only a little  Relationships  . Social connections:    Talks on phone: Patient refused    Gets together: Patient refused    Attends religious service: Patient refused    Active member of club or organization: Patient refused    Attends meetings of clubs or organizations: Patient refused    Relationship status: Patient refused  . Intimate partner violence:    Fear of current or ex partner: Patient refused    Emotionally abused: Patient refused    Physically abused: Patient refused    Forced sexual activity:  Patient refused  Other Topics Concern  . Not on file  Social History Narrative  . Not on file     Family History  Problem Relation Age of Onset  . Cancer Mother        lung  . Heart disease Father   . Hearing loss Sister        coclear implant  . Hearing loss Brother   . Cancer Paternal Grandmother   . Cancer Son        colon cancer  . Colon cancer Neg Hx   . Stomach cancer Neg Hx      ROS:  Please see the history of present illness.     All other systems reviewed and negative.    Physical Exam:  Blood pressure (!) 141/80, pulse 72, height 5\' 10"  (1.778 m), weight 187 lb (84.8 kg). General: Well developed, well nourished male in no acute distress. Head: Normocephalic, atraumatic, sclera non-icteric, no xanthomas, nares are without discharge. EENT: normal  Lymph Nodes:  none Neck: Negative for carotid bruits. JVD not elevated. Back:without scoliosis kyphosis  Lungs: Clear bilaterally to auscultation without wheezes, rales, or rhonchi. Breathing is unlabored. Heart: RRR with S1 S2. No  murmur . No rubs, or gallops appreciated. Abdomen: Soft, non-tender, non-distended with normoactive bowel sounds. No hepatomegaly. No rebound/guarding. No obvious abdominal masses. Msk:  Strength and tone appear normal for age. Extremities: No clubbing or cyanosis. No edema.  Distal pedal pulses are 2+ and equal bilaterally. Skin: Warm and Dry Neuro: Alert and oriented X  3. CN III-XII intact Grossly normal sensory and motor function . Psych:  Responds to questions appropriately with a normal affect.      Labs: Cardiac Enzymes No results for input(s): CKTOTAL, CKMB, TROPONINI in the last 72 hours. CBC Lab Results  Component Value Date   WBC 8.0 08/19/2018   HGB 14.8 08/19/2018   HCT 41.6 08/19/2018   MCV 92.0 08/19/2018   PLT 190 08/19/2018   PROTIME: No results for input(s): LABPROT, INR in the last 72 hours. Chemistry No results for input(s): NA, K, CL, CO2, BUN, CREATININE, CALCIUM, PROT, BILITOT, ALKPHOS, ALT, AST, GLUCOSE in the last 168 hours.  Invalid input(s): LABALBU Lipids Lab Results  Component Value Date   CHOL 95 08/07/2018   HDL 33 (L) 08/07/2018   LDLCALC 45 08/07/2018   TRIG 90 08/07/2018   BNP No results found for: PROBNP Thyroid Function Tests: No results for input(s): TSH, T4TOTAL, T3FREE, THYROIDAB in the last 72 hours.  Invalid input(s): FREET3 Miscellaneous No results found for: DDIMER  Radiology/Studies:  No results found.  EKG: sinus @ 72 32/15/46   Assessment and Plan:  Neurocognitive deficits  Dizziness-non-positional  Emotional lability  First-degree AV block  Left bundle branch block-rate related  Has a relatively abrupt onset of a non-positional dizziness.  It has been accompanied by progressive neurocognitive deficits and emotional lability.  The abruptness of onset and the neurocognitive impairments which have been rapidly progressive suggest that this is a noncardiac issue and without significant evidence of orthostasis (albeit there are 2 measurements that were suggestive over the last 6 months-most have been well within normal limits despite ongoing symptoms) there is little evidence for a cardiovascular cause.  While his MRI was read by radiology as normal, according to the family neurology thought he might of had a stroke.  The residual fatigue which she describes may well be part of  that.  The hypersensitivity of taste and smell which  he describes concurrent with the neurocognitive decline again suggests a neurological cause.  This being the case, will discontinue Florinef.  I will reach out to neurology.     Virl Axe

## 2019-01-24 NOTE — Patient Instructions (Signed)
Medication Instructions:  - Your physician has recommended you make the following change in your medication:   1) STOP florinef  If you need a refill on your cardiac medications before your next appointment, please call your pharmacy.   Lab work: - none ordered  If you have labs (blood work) drawn today and your tests are completely normal, you will receive your results only by: [name] MyChart Message (if you have MyChart) OR . A paper copy in the mail If you have any lab test that is abnormal or we need to change your treatment, we will call you to review the results.  Testing/Procedures: - none ordered  Follow-Up: At [facility], you and your health needs are our priority.  As part of our continuing mission to provide you with exceptional heart care, we have created designated Provider Care Teams.  These Care Teams include your primary Cardiologist (physician) and Advanced Practice Providers (APPs -  Physician Assistants and Nurse Practitioners) who all work together to provide you with the care you need, when you need it. [name] as needed with Dr. Caryl Comes  .  [name] As scheduled with Dr. Rockey Situ- 04/22/19 @ 8:00 am  Any Other Special Instructions Will Be Listed Below (If Applicable). - N/A

## 2019-01-28 ENCOUNTER — Ambulatory Visit: Payer: PPO | Attending: Neurology | Admitting: Speech Pathology

## 2019-01-28 ENCOUNTER — Ambulatory Visit: Payer: PPO | Admitting: Cardiovascular Disease

## 2019-01-28 DIAGNOSIS — R41841 Cognitive communication deficit: Secondary | ICD-10-CM

## 2019-01-28 DIAGNOSIS — R2681 Unsteadiness on feet: Secondary | ICD-10-CM | POA: Diagnosis not present

## 2019-01-28 DIAGNOSIS — M6281 Muscle weakness (generalized): Secondary | ICD-10-CM | POA: Insufficient documentation

## 2019-01-29 ENCOUNTER — Encounter: Payer: PPO | Admitting: Speech Pathology

## 2019-01-29 ENCOUNTER — Encounter: Payer: Self-pay | Admitting: Speech Pathology

## 2019-01-29 ENCOUNTER — Other Ambulatory Visit: Payer: Self-pay

## 2019-01-29 NOTE — Therapy (Signed)
[place] MAIN [facility] SERVICES [address] [place], [place], [address] Phone: [phone]   Fax:  [phone]  Speech Language Pathology Evaluation  Patient Details  Name: Stephen Stein MRN: [id number] Date of Birth: 05/06/1941 Referring Provider (SLP): Dr. Manuella Ghazi   Encounter Date: 01/28/2019  End of Session - 01/29/19 1632    Visit Number  1    Number of Visits  13    Date for SLP Re-Evaluation  03/11/19    SLP Start Time  0900    SLP Stop Time   1000    SLP Time Calculation (min)  60 min    Activity Tolerance  Patient tolerated treatment well       Past Medical History:  Diagnosis Date  . 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 Biliary dyskinesia    a.08/2018 s/p lap chole.  [name] BPH (benign prostatic hyperplasia)   . Coronary artery calcification seen on CT scan    a. 11/2015 Cardiac CT: Cor Ca2+ = 892 (80th %'ile). Diffuse Ca2+ in all 3 major coronary arteries; b. 02/2016 MV: HTN response to exercise. Abnl ecg w/ horizontal inflat ST depression, but no ischemia/infarct on imaging; c. 09/2018 MV: EF 64%, no ischemia. Very sm region of mild distal antsept defect, likely 2/2 lbbb.  . Hyperlipidemia   . Hypertension    For irregular heart beat  . Irregular heartbeat/Palpitations    a. Has been taking Atenolol for 20 years  . LBBB (left bundle branch block)   . Left ventricular hypokinesis    a. 09/2018 Echo: EF 45-50%, mod LVH, diff HK. Gr1 DD. Triv AI. Mildly dil Ao root. Mildly reduced RV fxn.  . Lightheadedness   . NSVT (nonsustained ventricular tachycardia) ([place])    a. 11/2018 Zio monitor - 13 beats of NSVT--asymptomatic.  . Orthostatic hypotension    a. 11/2018 pressures improved w/ midodrine/florinef but constant lightheadedness persists.  . Sinus bradycardia    a. 11/2018 Zio monitor: Avg HR 57 (min 28). Intermittent Mobitz I.    Past Surgical History:  Procedure Laterality Date  . CHOLECYSTECTOMY  N/A 09/13/2018   Procedure: LAPAROSCOPIC CHOLECYSTECTOMY;  Surgeon: Jules Husbands, MD;  Location: [facility] ORS;  Service: General;  Laterality: N/A;  . COLONOSCOPY  2008  . ESOPHAGOGASTRODUODENOSCOPY (EGD) WITH PROPOFOL N/A 08/20/2018   Procedure: ESOPHAGOGASTRODUODENOSCOPY (EGD) WITH PROPOFOL;  Surgeon: Lucilla Lame, MD;  Location: [facility] ENDOSCOPY;  Service: Endoscopy;  Laterality: N/A;  . TONSILLECTOMY AND ADENOIDECTOMY  1955    There were no vitals filed for this visit.      SLP Evaluation [facility] - 01/29/19 0001      SLP Visit Information   SLP Received On  01/28/19    Referring Provider (SLP)  Dr. Manuella Ghazi    Onset Date  01/11/2019    Medical Diagnosis  Memory loss      Subjective   Subjective  This patient was cooperative and in a pleasant mood. He was able to identify specific cognitive changes/ barriers since the onset of his problem (July/August 2019), but not devise practical strategies to address them.     Patient/Family Stated Goal  Identifying areas of weakness in cognition and their impact on ADLs and developing strategies to address them.       Pain Assessment   Currently in Pain?  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 This patient was referred to [place] by Neurologist for evaluation, due to reported sudden memory issues that began around July/August of 2019. Per admitting H&P, patient's wife reported patient has been having trouble remembering the day of the week, the year, and people's names. This patient does not have history of significant head trauma or family history of Dementia. Per admitting H&P, patient drinks significant amounts of alcohol, though not currently. Pt is going through a major life event. Patient's memory deficits do not affect his daily functioning.  Patient reported frequent feelings of boredom, changes in personality, dizziness or "air headedness" (per patient). This patient also reported heightened sense of smell and taste, at onset of memory issues back in July/August 2019. Patient has had MRI brain done. Patient's wife reported that patient was "not always this talkative," he was previously a "man of few words." Patient reported similarly, reporting he feels his personality has changed and he used to be more "in control," prior to the memory changes in July/August 2019.       Prior Functional Status   Cognitive/Linguistic Baseline  Within functional limits      Cognition   Overall Cognitive Status  Impaired/Different from baseline      Auditory Comprehension   Overall Auditory Comprehension  Appears within functional limits for tasks assessed      Reading Comprehension   Reading Status  Impaired    Paragraph Level  76-100% accurate      Expression   Primary Mode of Expression  Verbal      Verbal Expression   Overall Verbal Expression  Appears within functional limits for tasks assessed      Written Expression   Overall Writen Expression  Functional for needs      Oral Motor/Sensory Function   Overall Oral Motor/Sensory Function  Appears within functional limits for tasks assessed      Motor Speech   Overall Motor Speech  Other (comment)   Voluminous and rapid speech     Standardized Assessments   Standardized Assessments   Cognitive Linguistic Quick Test       Upon patient's self-report in case history/interview, onset of his memory deficits was July/August of 2019. Initially, patient thought his sudden changes in memory, dizziness, and heightened sense of smell and taste were related to Gallbladder issues and/or antibiotic prescription. Patient's dizziness persisted and he sought the help of a neurologist, who noted that the etiology of this patient's memory issues could source from a stroke, possible Dementia, and/or  natural degeneration over time. Neurologist referred to [place] services for further assessment of cognitive-linguistic abilities. ST noted patient's extremely rapid rate of speech and "chattiness."  The Cognitive Linguistic Quick Test (CLQT) was administered to assess the relative status of five cognitive domains: attention, memory, language, executive functioning, and visuospatial skills. Scores from 10 tasks were used to estimate severity ratings (for age groups 18-69 and 70-89 years) for each domain, a reading and writing task, clock drawing task, as well as an overall composite severity rating of cognition.  Task Score Criterion Cut Scores  Personal facts 8/8 8  Symbol Cancellation 11/12 10  Confrontation Naming 10/10 10  Clock Drawing 10/13 11  Story retelling 5/10 5  Symbol Trails 10/10 6  Generative Naming 7/9 4  Design Memory 5/6 4  Mazes 4/8 4  Design Generation 8/13 5   Cognitive Domain Composite  Score Severity Rating  Attention 173/215 Mild  Memory 143/185 WNL  Executive Function 29/40 WNL  Language 30/37 WNL  Visuospatial skills 72/105 WNL  Clock drawing 10/13 Mild  Composite Severity Rating 3.8/4.0 WNL   ST administered additional reading and writing tasks for a more complete evaluation of this patients cognitive-linguistic strengths and weaknesses. Patient read rapidly and skipped over a couple words, as well as mis-read a word in a small passage. Patient's reading comprehension was limited, as he correctly answered 3/5 questions, which is likely (partially) due to patient's rapid decoding of the reading passage. Patient's written habits are similar, in that patient wrote rapidly, which likely affected the legibility and comprehensibility of his writing. It is important to note that this patient did not follow directions; he wrote 3+ things he had already done and needed to do today, though he was instructed to only list 3. This impulsivity and initiation of tasks without listening  to full instructions was consistent throughout the entire evaluation and likely negatively impacted this patient's scores in task areas.  Throughout evaluation process, the patient completed all tasks rapidly and impulsively. The patient often did not allow ST to complete full directions before beginning the task. ST noted that patient spoke and completed all CLQT tasks rapidly and it appeared as though patient did not use strategies in completion of most-all assessment tasks. ST noted that patient used compensatory strategies of slowing down and talking through problems step-by-step when the tasks were more difficult/involved.  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 In his free time, patient enjoyed carpentry and building furniture, which he no longer does, because he "couldn't figure out how to do it anymore." He also reported he is now struggling with effectively using the computer anymore, as well as completing his taxes and finances which he used to do for himself and his friends. This patient was caring for his household prior to onset of memory issues in July/August 2019 and reported that his wife has taken over this role. This patient also reported often feeling bored and as if the days "feels 36 hours long." This patient ended this case history saying he "can't think, can't remember anything." Throughout the evaluation, the patient asked ST if he was doing okay with tasks, if his results were "normal," and mentioned he was completing tasks at a rapid rate to "prove" his abilities in the areas assessed.    SLP Education - 01/29/19 1632    Education Details  Plan of care    Person(s) Educated  Patient    Methods  Explanation    Comprehension  Verbalized understanding         SLP Long Term Goals - 01/29/19 1635      SLP LONG TERM GOAL #1   Title  Patient will demonstrate functional cognitive-communication  skills for independent completion of personal responsibilities and leisure activities.    Time  6    Period  Weeks    Status  New    Target Date  03/11/19      SLP LONG TERM GOAL #2   Title  Patient will identify cognitive-communication barriers and participate in developing functional compensatory strategies.    Time  6    Period  Weeks    Status  New    Target Date  03/11/19      SLP LONG TERM GOAL #3   Title  Patient will complete complex executive  function skills tasks with 80% accuracy.    Time  6    Period  Weeks    Status  New    Target Date  03/11/19      SLP LONG TERM GOAL #4   Title  Patient will demonstrate oral/written comprehension for abstract/complex verbal/written information via grammatical, fluent, and cogent sentences to complete abstract/complex linguistic tasks with 80% accuracy.    Time  6    Period  Weeks    Status  New    Target Date  03/11/19       Plan - 01/29/19 1634    Clinical Impression Statement  At 7 months post onset of abrupt cognitive changes, the patient is presenting with mild-moderate cognitive communication deficits characterized by rapid/voluminous speech, impulsive behaviors, reduced attention, reduced executive skills, and reduced motivation. The results of the Cognitive Linguistic Quick Test (CLQT) indicate a composite severity rating of WNL.  The patient scored with mild impairment in the realm of attention and within normal limits for memory, executive function, language, visuospatial skills, and clock drawing.   The patient's functional impairment is worse than his scores would suggest.  Both patient and his wife report significant changes in independence and activity level.  They are both very concerned with why these changes have occurred.  He may benefit from further cognitive testing with neuropsychology to aid in differential diagnosis.  In the meantime, the patient will benefit from skilled speech therapy to develop and implement plans  to improve the patient's independence, engagement, and activity.    Speech Therapy Frequency  2x / week    Duration  Other (comment)   6 weeks   Potential to Achieve Goals  Good    Potential Considerations  Ability to learn/carryover information;Family/community support;Previous level of function;Cooperation/participation level    SLP Home Exercise Plan  TBD    Consulted and Agree with Plan of Care  Patient       Patient will benefit from skilled therapeutic intervention in order to improve the following deficits and impairments:   Cognitive communication deficit    Problem List Patient Active Problem List   Diagnosis Date Noted  . Abnormal Holter exam 12/14/2018  . Vitamin B12 deficiency 10/15/2018  . Memory impairment 10/12/2018  . Hx of major depression 10/12/2018  . Biliary dyskinesia   . Protein-calorie malnutrition, severe 08/20/2018  . Gastritis without bleeding   . Duodenitis   . Nausea and vomiting 08/18/2018  . Left bundle branch block 06/25/2018  . Second degree AV block, Mobitz type I 06/25/2018  . Adrenal adenoma 05/09/2017  . Pulmonary nodules 04/19/2017  . Medication monitoring encounter 04/19/2017  . BPH (benign prostatic hyperplasia) 04/19/2017  . Pain in wrist 01/20/2017  . 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 CAD (coronary artery disease) 03/25/2016  . Abnormal EKG 12/16/2015  . Hyperlipidemia 07/07/2015  . Essential hypertension 07/07/2015  . Hyperglycemia 07/07/2015   Stephen Sea, MS/CCC- SLP  Lou Miner 01/29/2019, 4:40 PM  [place] MAIN [facility] SERVICES [address] [place], [place], [address] Phone: [phone]   Fax:  [phone]  Name: Stephen Stein MRN: [id number] Date of Birth: February 09, 1941

## 2019-01-31 ENCOUNTER — Ambulatory Visit: Payer: PPO | Admitting: Speech Pathology

## 2019-01-31 ENCOUNTER — Encounter: Payer: Self-pay | Admitting: Speech Pathology

## 2019-01-31 DIAGNOSIS — R41841 Cognitive communication deficit: Secondary | ICD-10-CM

## 2019-01-31 NOTE — Therapy (Signed)
[place] MAIN [facility] SERVICES [address] [place], [place], [address] Phone: [phone]   Fax:  [phone]  Speech Language Pathology Treatment  Patient Details  Name: Stephen Stein MRN: [id number] Date of Birth: [birth year] Referring Provider (SLP): Dr. Manuella Ghazi   Encounter Date: 01/31/2019  End of Session - 01/31/19 1149    Visit Number  2    Number of Visits  13    Date for SLP Re-Evaluation  03/11/19    SLP Start Time  0900    SLP Stop Time   0950    SLP Time Calculation (min)  50 min       Past Medical History:  Diagnosis Date  . 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 Biliary dyskinesia    a.08/2018 s/p lap chole.  [name] BPH (benign prostatic hyperplasia)   . Coronary artery calcification seen on CT scan    a. 11/2015 Cardiac CT: Cor Ca2+ = 892 (80th %'ile). Diffuse Ca2+ in all 3 major coronary arteries; b. 02/2016 MV: HTN response to exercise. Abnl ecg w/ horizontal inflat ST depression, but no ischemia/infarct on imaging; c. 09/2018 MV: EF 64%, no ischemia. Very sm region of mild distal antsept defect, likely 2/2 lbbb.  . Hyperlipidemia   . Hypertension    For irregular heart beat  . Irregular heartbeat/Palpitations    a. Has been taking Atenolol for 20 years  . LBBB (left bundle branch block)   . Left ventricular hypokinesis    a. 09/2018 Echo: EF 45-50%, mod LVH, diff HK. Gr1 DD. Triv AI. Mildly dil Ao root. Mildly reduced RV fxn.  . Lightheadedness   . NSVT (nonsustained ventricular tachycardia) ([place])    a. 11/2018 Zio monitor - 13 beats of NSVT--asymptomatic.  . Orthostatic hypotension    a. 11/2018 pressures improved w/ midodrine/florinef but constant lightheadedness persists.  . Sinus bradycardia    a. 11/2018 Zio monitor: Avg HR 57 (min 28). Intermittent Mobitz I.    Past Surgical History:  Procedure Laterality Date  . CHOLECYSTECTOMY N/A 09/13/2018   Procedure: LAPAROSCOPIC CHOLECYSTECTOMY;   Surgeon: Jules Husbands, MD;  Location: [facility] ORS;  Service: General;  Laterality: N/A;  . COLONOSCOPY  2008  . ESOPHAGOGASTRODUODENOSCOPY (EGD) WITH PROPOFOL N/A 08/20/2018   Procedure: ESOPHAGOGASTRODUODENOSCOPY (EGD) WITH PROPOFOL;  Surgeon: Lucilla Lame, MD;  Location: [facility] ENDOSCOPY;  Service: Endoscopy;  Laterality: N/A;  . TONSILLECTOMY AND ADENOIDECTOMY  1955    There were no vitals filed for this visit.  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 This patient was referred to [place] by Neurologist for evaluation, due to reported sudden memory issues that began around July/August of 2019. Per admitting H&P, patient's wife reported patient has been having trouble remembering the day of the week, the year, and people's names. This patient does not have history of significant head trauma or family history of Dementia. Per admitting H&P, patient drinks significant amounts of alcohol, though not currently. Pt is going through a major life event. Patient's memory deficits do  not affect his daily functioning. Patient reported frequent feelings of boredom, changes in personality, dizziness or "air headedness" (per patient). This patient also reported heightened sense of smell and taste, at onset of memory issues back in July/August 2019. Patient has had MRI brain done. Patient's wife reported that patient was "not always this talkative," he was previously a "man of few words." Patient reported similarly, reporting he feels his personality has changed and he used to be more "in control," prior to the memory  changes in July/August 2019.        Treatment Provided   Treatment provided  Cognitive-Linquistic      Pain Assessment   Pain Assessment  No/denies pain      Cognitive-Linquistic Treatment   Treatment focused on  Cognition    Skilled Treatment  PATIENT EDUCATION:  Discussed results of testing.  Patient stated understanding that his primary cognitive deficits are impulsivity and reduced attention.  Patient acknowledges that these are deficits and understands how they impact on daily functioning.  Reviewed handout containing tips for living with cognitive impairment.  Patient receptive to considering each point and considering impact on his life given his current cognitive status.  IDENTIFY COGNITIVE BARRIERS:  The patient discussed many barriers during the discussion, including: ongoing dizziness, bored trying to figure out what to do, difficult to play games, impulsive with some anger management issues, feels lonely when not with/touching wife.  ASSIGMENT: Patient asked to get a carry a notebook and write down every time he has a problem due to memory problem, behavioral change, or dizziness.      Assessment / Recommendations / Plan   Plan  Continue with current plan of care      Progression Toward Goals   Progression toward goals  Progressing toward goals       SLP Education - 01/31/19 1149    Education Details  Tips for living with memory loss    Person(s) Educated  Patient    Methods  Explanation;Handout    Comprehension  Verbalized understanding         SLP Long Term Goals - 01/29/19 1635      SLP LONG TERM GOAL #1   Title  Patient will demonstrate functional cognitive-communication skills for independent completion of personal responsibilities and leisure activities.    Time  6    Period  Weeks    Status  New    Target Date  03/11/19      SLP LONG TERM GOAL #2   Title  Patient will identify cognitive-communication barriers and participate in developing functional  compensatory strategies.    Time  6    Period  Weeks    Status  New    Target Date  03/11/19      SLP LONG TERM GOAL #3   Title  Patient will complete complex executive function skills tasks with 80% accuracy.    Time  6    Period  Weeks    Status  New    Target Date  03/11/19      SLP LONG TERM GOAL #4   Title  Patient will demonstrate oral/written comprehension for abstract/complex verbal/written information via grammatical, fluent, and cogent sentences to complete abstract/complex linguistic tasks with 80% accuracy.    Time  6    Period  Weeks    Status  New    Target Date  03/11/19       Plan - 01/31/19 1150    Clinical Impression Statement  The patient is receptive to identifying and defining cognitive barriers and impact on his everyday function.  He is actively participating in therapy.    Speech Therapy Frequency  2x / week    Duration  Other (comment)    Treatment/Interventions  Cognitive reorganization;Patient/family education    Potential to Achieve Goals  Good    Potential Considerations  Ability to learn/carryover information;Family/community support;Previous level of function;Cooperation/participation level    SLP Home Exercise Plan  Provided    Consulted and Agree with Plan of Care  Patient       Patient will benefit from skilled therapeutic intervention in order to improve the following deficits and impairments:   Cognitive communication deficit    Problem List Patient Active Problem List   Diagnosis Date Noted  . Abnormal Holter exam 12/14/2018  . Vitamin B12 deficiency 10/15/2018  . Memory impairment 10/12/2018  . Hx of major depression 10/12/2018  . Biliary dyskinesia   . Protein-calorie malnutrition, severe 08/20/2018  . Gastritis without bleeding   . Duodenitis   . Nausea and vomiting 08/18/2018  . Left bundle branch block 06/25/2018  . Second degree AV block, Mobitz type I 06/25/2018  . Adrenal adenoma 05/09/2017  . Pulmonary nodules  04/19/2017  . Medication monitoring encounter 04/19/2017  . BPH (benign prostatic hyperplasia) 04/19/2017  . Pain in wrist 01/20/2017  . 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 CAD (coronary artery disease) 03/25/2016  . Abnormal EKG 12/16/2015  . Hyperlipidemia 07/07/2015  . Essential hypertension 07/07/2015  . Hyperglycemia 07/07/2015   Leroy Sea, MS/CCC- SLP  Lou Miner 01/31/2019, 11:51 AM  [place] MAIN [facility] SERVICES [address] [place], [place], [address] Phone: [phone]   Fax:  [phone]   Name: Stephen Stein MRN: [id number] Date of Birth: 04-May-1941

## 2019-02-04 ENCOUNTER — Encounter: Payer: Self-pay | Admitting: Family Medicine

## 2019-02-04 ENCOUNTER — Ambulatory Visit (INDEPENDENT_AMBULATORY_CARE_PROVIDER_SITE_OTHER): Payer: PPO | Admitting: Family Medicine

## 2019-02-04 VITALS — BP 124/72 | HR 82 | Temp 98.7°F | Ht 70.0 in | Wt 186.1 lb

## 2019-02-04 DIAGNOSIS — R413 Other amnesia: Secondary | ICD-10-CM | POA: Diagnosis not present

## 2019-02-04 DIAGNOSIS — R278 Other lack of coordination: Secondary | ICD-10-CM

## 2019-02-04 DIAGNOSIS — R296 Repeated falls: Secondary | ICD-10-CM

## 2019-02-04 DIAGNOSIS — R2689 Other abnormalities of gait and mobility: Secondary | ICD-10-CM

## 2019-02-04 DIAGNOSIS — R930 Abnormal findings on diagnostic imaging of skull and head, not elsewhere classified: Secondary | ICD-10-CM | POA: Diagnosis not present

## 2019-02-04 DIAGNOSIS — R531 Weakness: Secondary | ICD-10-CM | POA: Diagnosis not present

## 2019-02-04 NOTE — Patient Instructions (Signed)
We'll have you see Dr. Manuella Ghazi sooner if at all possible We'll get a carotid ultrasound of the neck Try to limit saturated fats in your diet (bologna, hot dogs, barbeque, cheeseburgers, hamburgers, steak, bacon, sausage, cheese, etc.) and get more fresh fruits, vegetables, and whole grains Let me know if you change your mind about the sleep study We'll have you start working with physical therapy I am here for you

## 2019-02-04 NOTE — Progress Notes (Signed)
BP 124/72   Pulse 82   Temp 98.7 F (37.1 C)   Ht 5\' 10"  (1.778 m)   Wt 186 lb 1.6 oz (84.4 kg)   SpO2 94%   BMI 26.70 kg/m    Subjective:    Patient ID: Stephen Stein, male    DOB: 10-Oct-1941, 78 y.o.   MRN: [id number]  HPI: Stephen Stein is a 78 y.o. male  Chief Complaint  Patient presents with  . Follow-up    HPI He saw Dr. Manuella Ghazi He thinks he had a stroke Dr. Caryl Comes stopped all of the medicine, not a heart issues They have an appointment to see Dr. Trena Platt PA in May They think stroke-related No snoring, up every hour to go to the bathroom, maybe every 90 minutes; going to the bathroom; not much; enlarged prostate, not interested at this time and can vcall me for referral if needed  He has gone to speech pathologist twice and was recommended to see PT; he declined at first; he cannot speak very well, clutz; gait is poor, not picking feet up; if he picks feet up, more stabbiity; falling, living room to another room, does not raise foot enough; got a ladder on Saturday, lifting bird feeder off; more impulsive now, than before; emotional lability  On sertraline 50 mg daily; he does not seem as tearful, not crying over commercials, etc.  Low vitamin B12, now taking supplement 1000 mcg daily  Memory is the same, no worse  If he does not rest well, the next day he is shorter tempered, religiously took a nap before, not any more No witnessed apnea, patient snores, but not terribly; mother snored; never feels like he gets relaxed  MRI October 2019 reviewed  EXAM: MRI HEAD WITHOUT CONTRAST  TECHNIQUE: Multiplanar, multiecho pulse sequences of the brain and surrounding structures were obtained without intravenous contrast.  COMPARISON:  None.  FINDINGS: Brain: Scattered subcortical T2 hyperintensities bilaterally are likely within normal limits for age. Mild atrophy is present. Ventricles are proportionate to the degree of atrophy. Basal ganglia are intact. Internal  auditory canals are within normal limits bilaterally. The brainstem and cerebellum are normal.  Vascular: Flow is present in the major intracranial arteries.  Skull and upper cervical spine: Skull base is within normal limits. Craniocervical junction is normal.  Sinuses/Orbits: The paranasal sinuses high and mastoid air cells are clear. Globes and orbits are within normal limits.  IMPRESSION: Normal MRI the brain for age.   Electronically Signed   By: San Morelle M.D.   On: 09/26/2018 17:04   Echo October 2019: EXAM: MRI HEAD WITHOUT CONTRAST  TECHNIQUE: Multiplanar, multiecho pulse sequences of the brain and surrounding structures were obtained without intravenous contrast.  COMPARISON:  None.  FINDINGS: Brain: Scattered subcortical T2 hyperintensities bilaterally are likely within normal limits for age. Mild atrophy is present. Ventricles are proportionate to the degree of atrophy. Basal ganglia are intact. Internal auditory canals are within normal limits bilaterally. The brainstem and cerebellum are normal.  Vascular: Flow is present in the major intracranial arteries.  Skull and upper cervical spine: Skull base is within normal limits. Craniocervical junction is normal.  Sinuses/Orbits: The paranasal sinuses high and mastoid air cells are clear. Globes and orbits are within normal limits.  IMPRESSION: Normal MRI the brain for age.   Electronically Signed   By: San Morelle M.D.   On: 09/26/2018 17:04  Depression screen [facility] 2/9 02/04/2019 12/14/2018 10/12/2018 09/05/2018 09/05/2018  Decreased Interest 0  0 0 0 0  Down, Depressed, Hopeless 0 0 0 1 0  PHQ - 2 Score 0 0 0 1 0  Altered sleeping 0 0 - 0 -  Tired, decreased energy 0 0 - 3 -  Change in appetite 0 0 - 3 -  Feeling bad or failure about yourself  0 0 - 1 -  Trouble concentrating 0 0 - 1 -  Moving slowly or fidgety/restless 0 0 - 1 -  Suicidal thoughts 0 0 - 0 -    PHQ-9 Score 0 0 - 10 -  Difficult doing work/chores Not difficult at all Not difficult at all - Somewhat difficult -   Fall Risk  02/04/2019 12/14/2018 10/12/2018 09/24/2018 09/21/2018  Falls in the past year? 1 0 Yes No No  Number falls in past yr: 1 - 2 or more - -  Injury with Fall? 0 - No - -  Comment - - - - -    Relevant past medical, surgical, family and social history reviewed Past Medical History:  Diagnosis Date  . 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 Biliary dyskinesia    a.08/2018 s/p lap chole.  [name] BPH (benign prostatic hyperplasia)   . Coronary artery calcification seen on CT scan    a. 11/2015 Cardiac CT: Cor Ca2+ = 892 (80th %'ile). Diffuse Ca2+ in all 3 major coronary arteries; b. 02/2016 MV: HTN response to exercise. Abnl ecg w/ horizontal inflat ST depression, but no ischemia/infarct on imaging; c. 09/2018 MV: EF 64%, no ischemia. Very sm region of mild distal antsept defect, likely 2/2 lbbb.  . Hyperlipidemia   . Hypertension    For irregular heart beat  . Irregular heartbeat/Palpitations    a. Has been taking Atenolol for 20 years  . LBBB (left bundle branch block)   . Left ventricular hypokinesis    a. 09/2018 Echo: EF 45-50%, mod LVH, diff HK. Gr1 DD. Triv AI. Mildly dil Ao root. Mildly reduced RV fxn.  . Lightheadedness   . NSVT (nonsustained ventricular tachycardia) ([place])    a. 11/2018 Zio monitor - 13 beats of NSVT--asymptomatic.  . Orthostatic hypotension    a. 11/2018 pressures improved w/ midodrine/florinef but constant lightheadedness persists.  . Sinus bradycardia    a. 11/2018 Zio monitor: Avg HR 57 (min 28). Intermittent Mobitz I.   Past Surgical History:  Procedure Laterality Date  . CHOLECYSTECTOMY N/A 09/13/2018   Procedure: LAPAROSCOPIC CHOLECYSTECTOMY;  Surgeon: Jules Husbands, MD;  Location: [facility] ORS;  Service: General;  Laterality: N/A;  . COLONOSCOPY  2008  . ESOPHAGOGASTRODUODENOSCOPY (EGD) WITH PROPOFOL N/A  08/20/2018   Procedure: ESOPHAGOGASTRODUODENOSCOPY (EGD) WITH PROPOFOL;  Surgeon: Lucilla Lame, MD;  Location: [facility] ENDOSCOPY;  Service: Endoscopy;  Laterality: N/A;  . TONSILLECTOMY AND ADENOIDECTOMY  1955   Family History  Problem Relation Age of Onset  . Cancer Mother        lung  . Heart disease Father   . Hearing loss Sister        coclear implant  . Hearing loss Brother   . Cancer Paternal Grandmother   . Cancer Son        colon cancer  . Colon cancer Neg Hx   . Stomach cancer Neg Hx    Social History   Tobacco Use  . Smoking status: Former Smoker    Last attempt to quit: 06/22/1992    Years since quitting: 26.6  . Smokeless tobacco:  Former Systems developer    Quit date: 06/22/1992  Substance Use Topics  . Alcohol use: Yes    Alcohol/week: 1.0 standard drinks    Types: 1 Cans of beer per week    Comment: occasional  . Drug use: No     Office Visit from 02/04/2019 in [facility]  AUDIT-C Score  0      Interim medical history since last visit reviewed. Allergies and medications reviewed  Review of Systems Per HPI unless specifically indicated above     Objective:    BP 124/72   Pulse 82   Temp 98.7 F (37.1 C)   Ht 5\' 10"  (1.778 m)   Wt 186 lb 1.6 oz (84.4 kg)   SpO2 94%   BMI 26.70 kg/m   Wt Readings from Last 3 Encounters:  02/04/19 186 lb 1.6 oz (84.4 kg)  01/24/19 187 lb (84.8 kg)  01/16/19 189 lb 12 oz (86.1 kg)    Physical Exam Constitutional:      General: He is not in acute distress.    Appearance: He is well-developed.  HENT:     Head: Normocephalic and atraumatic.  Eyes:     General: No scleral icterus. Neck:     Thyroid: No thyromegaly.  Cardiovascular:     Rate and Rhythm: Normal rate and regular rhythm.  Pulmonary:     Effort: Pulmonary effort is normal.     Breath sounds: Normal breath sounds.  Abdominal:     General: Bowel sounds are normal. There is no distension.     Palpations: Abdomen is soft.  Skin:     General: Skin is warm and dry.     Coloration: Skin is not pale.  Neurological:     Coordination: Coordination normal.  Psychiatric:        Behavior: Behavior normal.        Thought Content: Thought content normal.        Judgment: Judgment normal.     Results for orders placed or performed during the hospital encounter of 10/15/18  TSH  Result Value Ref Range   TSH 2.331 0.350 - 4.500 uIU/mL  Vitamin B12  Result Value Ref Range   Vitamin B-12 320 180 - 914 pg/mL  Cortisol  Result Value Ref Range   Cortisol, Plasma 11.6 ug/dL  ACTH  Result Value Ref Range   C206 ACTH 38.0 7.2 - 63.3 pg/mL      Assessment & Plan:   Problem List Items Addressed This Visit      Other   Memory impairment   Relevant Orders   US Carotid Bilateral    Other Visit Diagnoses    Clumsiness    -  Primary   Relevant Orders   Ambulatory referral to Physical Therapy   Falls frequently       Relevant Orders   Ambulatory referral to Physical Therapy   Weakness       Relevant Orders   Ambulatory referral to Physical Therapy   Balance problems       Relevant Orders   US Carotid Bilateral   Abnormal MRI of head       Relevant Orders   US Carotid Bilateral       Follow up plan: No follow-ups on file.  An after-visit summary was printed and given to the patient at [place].  Please see the patient instructions which may contain other information and recommendations beyond what is mentioned above in the assessment and plan.  No orders of the defined types were placed in this encounter.   Orders Placed This Encounter  Procedures  . US Carotid Bilateral  . Ambulatory referral to Physical Therapy

## 2019-02-05 ENCOUNTER — Encounter: Payer: Self-pay | Admitting: Speech Pathology

## 2019-02-05 ENCOUNTER — Ambulatory Visit: Payer: PPO | Admitting: Speech Pathology

## 2019-02-05 DIAGNOSIS — R41841 Cognitive communication deficit: Secondary | ICD-10-CM | POA: Diagnosis not present

## 2019-02-05 NOTE — Therapy (Signed)
[place] MAIN [facility] SERVICES [address] [place], [place], [address] Phone: [phone]   Fax:  [phone]  Speech Language Pathology Treatment  Patient Details  Name: Stephen Stein MRN: [id number] Date of Birth: February 21, 1941 Referring Provider (SLP): Dr. Manuella Ghazi   Encounter Date: 02/05/2019  End of Session - 02/05/19 1256    Visit Number  3    Number of Visits  13    Date for SLP Re-Evaluation  03/11/19    SLP Start Time  0900    SLP Stop Time   0947    SLP Time Calculation (min)  47 min    Activity Tolerance  Patient tolerated treatment well       Past Medical History:  Diagnosis Date  . 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 Biliary dyskinesia    a.08/2018 s/p lap chole.  [name] BPH (benign prostatic hyperplasia)   . Coronary artery calcification seen on CT scan    a. 11/2015 Cardiac CT: Cor Ca2+ = 892 (80th %'ile). Diffuse Ca2+ in all 3 major coronary arteries; b. 02/2016 MV: HTN response to exercise. Abnl ecg w/ horizontal inflat ST depression, but no ischemia/infarct on imaging; c. 09/2018 MV: EF 64%, no ischemia. Very sm region of mild distal antsept defect, likely 2/2 lbbb.  . Hyperlipidemia   . Hypertension    For irregular heart beat  . Irregular heartbeat/Palpitations    a. Has been taking Atenolol for 20 years  . LBBB (left bundle branch block)   . Left ventricular hypokinesis    a. 09/2018 Echo: EF 45-50%, mod LVH, diff HK. Gr1 DD. Triv AI. Mildly dil Ao root. Mildly reduced RV fxn.  . Lightheadedness   . NSVT (nonsustained ventricular tachycardia) ([place])    a. 11/2018 Zio monitor - 13 beats of NSVT--asymptomatic.  . Orthostatic hypotension    a. 11/2018 pressures improved w/ midodrine/florinef but constant lightheadedness persists.  . Sinus bradycardia    a. 11/2018 Zio monitor: Avg HR 57 (min 28). Intermittent Mobitz I.    Past Surgical History:  Procedure Laterality Date  . CHOLECYSTECTOMY  N/A 09/13/2018   Procedure: LAPAROSCOPIC CHOLECYSTECTOMY;  Surgeon: Jules Husbands, MD;  Location: [facility] ORS;  Service: General;  Laterality: N/A;  . COLONOSCOPY  2008  . ESOPHAGOGASTRODUODENOSCOPY (EGD) WITH PROPOFOL N/A 08/20/2018   Procedure: ESOPHAGOGASTRODUODENOSCOPY (EGD) WITH PROPOFOL;  Surgeon: Lucilla Lame, MD;  Location: [facility] ENDOSCOPY;  Service: Endoscopy;  Laterality: N/A;  . TONSILLECTOMY AND ADENOIDECTOMY  1955    There were no vitals filed for this visit.  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 This patient was referred to [place] by Neurologist for evaluation, due to reported sudden memory issues that began around July/August of 2019. Per admitting H&P, patient's wife reported patient has been having trouble remembering the day of the week, the year, and people's names. This patient does not have history of significant head trauma or family history of Dementia. Per admitting H&P, patient drinks significant amounts of alcohol, though not currently. Pt is  going through a major life event. Patient's memory deficits do not affect his daily functioning. Patient reported frequent feelings of boredom, changes in personality, dizziness or "air headedness" (per patient). This patient also reported heightened sense of smell and taste, at onset of memory issues back in July/August 2019. Patient has had MRI brain done. Patient's wife reported that patient was "not always this talkative," he was previously a "man of few words." Patient reported similarly, reporting he feels his personality has  changed and he used to be more "in control," prior to the memory changes in July/August 2019.        Treatment Provided   Treatment provided  Cognitive-Linquistic      Pain Assessment   Pain Assessment  No/denies pain      Cognitive-Linquistic Treatment   Treatment focused on  Cognition    Skilled Treatment  IDENTIFY COGNITIVE BARRIERS:  the patient identified recalling names, word finding, and failure to avoid dangerous undertakings alone.  MEMORY:  The patient got a small notebook as suggested and is taking notes.  The patient was able to tell me the day/date accurately, siting pegging the information to known date of visit from friends.  Patient able to recall 8 of 10 words at 10 minutes.  WORD FINDING: Name category given 3 members with 80% accuracy.      Assessment / Recommendations / Plan   Plan  Continue with current plan of care      Progression Toward Goals   Progression toward goals  Progressing toward goals       SLP Education - 02/05/19 1256    Education Details  Keep verbalization simple    Person(s) Educated  Patient    Methods  Explanation    Comprehension  Verbalized understanding         SLP Long Term Goals - 01/29/19 1635      SLP LONG TERM GOAL #1   Title  Patient will demonstrate functional cognitive-communication skills for independent completion of personal responsibilities and leisure activities.    Time  6    Period  Weeks    Status  New    Target Date  03/11/19      SLP LONG TERM GOAL #2   Title  Patient will identify cognitive-communication barriers and participate in developing functional compensatory strategies.    Time  6    Period  Weeks    Status  New    Target Date  03/11/19      SLP LONG TERM GOAL #3   Title  Patient will complete complex executive function skills tasks with 80% accuracy.    Time  6    Period  Weeks    Status  New    Target Date  03/11/19      SLP LONG TERM GOAL #4   Title  Patient will demonstrate oral/written  comprehension for abstract/complex verbal/written information via grammatical, fluent, and cogent sentences to complete abstract/complex linguistic tasks with 80% accuracy.    Time  6    Period  Weeks    Status  New    Target Date  03/11/19       Plan - 02/05/19 1257    Clinical Impression Statement  The patient is receptive to identifying and defining cognitive barriers and impact on his everyday function.  He is actively participating in therapy.    Speech Therapy Frequency  2x / week    Duration  Other (comment)    Potential Considerations  Ability to learn/carryover information;Family/community support;Previous level of function;Cooperation/participation level    Consulted and Agree with Plan of Care  Patient       Patient will benefit from skilled therapeutic intervention in order to improve the following deficits and impairments:   Cognitive communication deficit    Problem List Patient Active Problem List   Diagnosis Date Noted  . Abnormal Holter exam 12/14/2018  . Vitamin B12 deficiency 10/15/2018  . Memory impairment 10/12/2018  . Hx of major depression 10/12/2018  . Biliary dyskinesia   . Protein-calorie malnutrition, severe 08/20/2018  . Gastritis without bleeding   . Duodenitis   . Nausea and vomiting 08/18/2018  . Left bundle branch block 06/25/2018  . Second degree AV block, Mobitz type I 06/25/2018  . Adrenal adenoma 05/09/2017  . Pulmonary nodules 04/19/2017  . Medication monitoring encounter 04/19/2017  . BPH (benign prostatic hyperplasia) 04/19/2017  . Pain in wrist 01/20/2017  . 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 CAD (coronary artery disease) 03/25/2016  . Abnormal EKG 12/16/2015  . Hyperlipidemia 07/07/2015  . Essential hypertension 07/07/2015  . Hyperglycemia 07/07/2015   Leroy Sea, MS/CCC- SLP  Lou Miner 02/05/2019, 12:58 PM  [place] MAIN [facility] SERVICES [address]  [place], [place], [address] Phone: [phone]   Fax:  [phone]   Name: Stephen Stein MRN: [id number] Date of Birth: 12-03-1941

## 2019-02-07 ENCOUNTER — Ambulatory Visit: Payer: PPO | Admitting: Speech Pathology

## 2019-02-07 ENCOUNTER — Encounter: Payer: Self-pay | Admitting: Speech Pathology

## 2019-02-07 DIAGNOSIS — R41841 Cognitive communication deficit: Secondary | ICD-10-CM | POA: Diagnosis not present

## 2019-02-07 NOTE — Therapy (Signed)
[place] MAIN [facility] SERVICES [address] [place], [place], [address] Phone: [phone]   Fax:  [phone]  Speech Language Pathology Treatment  Patient Details  Name: Stephen Stein MRN: [id number] Date of Birth: 08-15-1941 Referring Provider (SLP): Dr. Manuella Ghazi   Encounter Date: 02/07/2019  End of Session - 02/07/19 1129    Visit Number  4    Number of Visits  13    Date for SLP Re-Evaluation  03/11/19       Past Medical History:  Diagnosis Date  . 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 Biliary dyskinesia    a.08/2018 s/p lap chole.  [name] BPH (benign prostatic hyperplasia)   . Coronary artery calcification seen on CT scan    a. 11/2015 Cardiac CT: Cor Ca2+ = 892 (80th %'ile). Diffuse Ca2+ in all 3 major coronary arteries; b. 02/2016 MV: HTN response to exercise. Abnl ecg w/ horizontal inflat ST depression, but no ischemia/infarct on imaging; c. 09/2018 MV: EF 64%, no ischemia. Very sm region of mild distal antsept defect, likely 2/2 lbbb.  . Hyperlipidemia   . Hypertension    For irregular heart beat  . Irregular heartbeat/Palpitations    a. Has been taking Atenolol for 20 years  . LBBB (left bundle branch block)   . Left ventricular hypokinesis    a. 09/2018 Echo: EF 45-50%, mod LVH, diff HK. Gr1 DD. Triv AI. Mildly dil Ao root. Mildly reduced RV fxn.  . Lightheadedness   . NSVT (nonsustained ventricular tachycardia) ([place])    a. 11/2018 Zio monitor - 13 beats of NSVT--asymptomatic.  . Orthostatic hypotension    a. 11/2018 pressures improved w/ midodrine/florinef but constant lightheadedness persists.  . Sinus bradycardia    a. 11/2018 Zio monitor: Avg HR 57 (min 28). Intermittent Mobitz I.    Past Surgical History:  Procedure Laterality Date  . CHOLECYSTECTOMY N/A 09/13/2018   Procedure: LAPAROSCOPIC CHOLECYSTECTOMY;  Surgeon: Jules Husbands, MD;  Location: [facility] ORS;  Service: General;  Laterality: N/A;   . COLONOSCOPY  2008  . ESOPHAGOGASTRODUODENOSCOPY (EGD) WITH PROPOFOL N/A 08/20/2018   Procedure: ESOPHAGOGASTRODUODENOSCOPY (EGD) WITH PROPOFOL;  Surgeon: Lucilla Lame, MD;  Location: [facility] ENDOSCOPY;  Service: Endoscopy;  Laterality: N/A;  . TONSILLECTOMY AND ADENOIDECTOMY  1955    There were no vitals filed for this visit.  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 This patient was referred to [facility] by Neurologist for evaluation, due to reported sudden memory issues that began around July/August of 2019. Per admitting H&P, patient's wife reported patient has been having trouble remembering the day of the week, the year, and people's names. This patient does not have history of significant head trauma or family history of Dementia. Per admitting H&P, patient drinks significant amounts of alcohol, though not currently. Pt is going through a major life event. Patient's memory deficits do not affect his daily functioning. Patient reported frequent feelings of boredom, changes in personality, dizziness or "air headedness" (per patient). This patient also reported heightened sense of smell  and taste, at onset of memory issues back in July/August 2019. Patient has had MRI brain done. Patient's wife reported that patient was "not always this talkative," he was previously a "man of few words." Patient reported similarly, reporting he feels his personality has changed and he used to be more "in control," prior to the memory changes in July/August 2019.        Treatment Provided   Treatment provided  Cognitive-Linquistic       Pain Assessment   Pain Assessment  No/denies pain      Cognitive-Linquistic Treatment   Treatment focused on  Cognition    Skilled Treatment  IDENTIFY COGNITIVE BARRIERS:  The patient identified "it hurts to think", meaning that deep concentration/reasoning was difficult and frustrating.  He also identifies a tendency to monopolize conversation and is trying to reign that in with company.  MEMORY:  The patient got a small notebook as suggested and is taking notes.  Patient able to recall 8 of 10 words at 10 minutes.  The patient is retaining topics of discussion in previous session.  SUSTAINED CONCENTRATION/REASONING:  Requires mod-max cues to complete simple Perplexor puzzle.  ASSIGNMENT: Patient given math word problems and syllogisms to complete over the next week.      Assessment / Recommendations / Plan   Plan  Continue with current plan of care      Progression Toward Goals   Progression toward goals  Progressing toward goals       SLP Education - 02/07/19 1128    Education Details  Try conentrating on simpler tasks and move up to more complex tasks    Person(s) Educated  Patient    Methods  Explanation    Comprehension  Verbalized understanding         SLP Long Term Goals - 01/29/19 1635      SLP LONG TERM GOAL #1   Title  Patient will demonstrate functional cognitive-communication skills for independent completion of personal responsibilities and leisure activities.    Time  6    Period  Weeks    Status  New    Target Date  03/11/19      SLP LONG TERM GOAL #2   Title  Patient will identify cognitive-communication barriers and participate in developing functional compensatory strategies.    Time  6    Period  Weeks    Status  New    Target Date  03/11/19      SLP LONG TERM GOAL #3   Title  Patient will complete complex executive function skills tasks with 80% accuracy.    Time  6    Period  Weeks    Status  New    Target Date  03/11/19      SLP LONG TERM  GOAL #4   Title  Patient will demonstrate oral/written comprehension for abstract/complex verbal/written information via grammatical, fluent, and cogent sentences to complete abstract/complex linguistic tasks with 80% accuracy.    Time  6    Period  Weeks    Status  New    Target Date  03/11/19       Plan - 02/07/19 1129    Clinical Impression Statement  The patient is receptive to identifying and defining cognitive barriers and impact on his everyday function.  He is actively participating in therapy.  He demonstrates insight into his deficits and is making effective changes.    Speech Therapy Frequency  2x / week    Duration  Other (  comment)    Treatment/Interventions  Cognitive reorganization;Patient/family education    Potential to Achieve Goals  Good    Potential Considerations  Ability to learn/carryover information;Family/community support;Previous level of function;Cooperation/participation level    SLP Home Exercise Plan  Provided    Consulted and Agree with Plan of Care  Patient       Patient will benefit from skilled therapeutic intervention in order to improve the following deficits and impairments:   Cognitive communication deficit    Problem List Patient Active Problem List   Diagnosis Date Noted  . Abnormal Holter exam 12/14/2018  . Vitamin B12 deficiency 10/15/2018  . Memory impairment 10/12/2018  . Hx of major depression 10/12/2018  . Biliary dyskinesia   . Protein-calorie malnutrition, severe 08/20/2018  . Gastritis without bleeding   . Duodenitis   . Nausea and vomiting 08/18/2018  . Left bundle branch block 06/25/2018  . Second degree AV block, Mobitz type I 06/25/2018  . Adrenal adenoma 05/09/2017  . Pulmonary nodules 04/19/2017  . Medication monitoring encounter 04/19/2017  . BPH (benign prostatic hyperplasia) 04/19/2017  . Pain in wrist 01/20/2017  . 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 CAD (coronary artery disease) 03/25/2016  .  Abnormal EKG 12/16/2015  . Hyperlipidemia 07/07/2015  . Essential hypertension 07/07/2015  . Hyperglycemia 07/07/2015   Leroy Sea, MS/CCC- SLP  Lou Miner 02/07/2019, 11:30 AM  [place] MAIN [facility] SERVICES [address] [place], [place], [address] Phone: [phone]   Fax:  [phone]   Name: Stephen Stein MRN: [id number] Date of Birth: [birth year]

## 2019-02-10 ENCOUNTER — Encounter: Payer: Self-pay | Admitting: Family Medicine

## 2019-02-10 DIAGNOSIS — R413 Other amnesia: Secondary | ICD-10-CM

## 2019-02-10 DIAGNOSIS — Z8659 Personal history of other mental and behavioral disorders: Secondary | ICD-10-CM

## 2019-02-10 DIAGNOSIS — F329 Major depressive disorder, single episode, unspecified: Secondary | ICD-10-CM

## 2019-02-10 DIAGNOSIS — F32A Depression, unspecified: Secondary | ICD-10-CM

## 2019-02-12 ENCOUNTER — Ambulatory Visit: Payer: PPO | Admitting: Speech Pathology

## 2019-02-12 DIAGNOSIS — E538 Deficiency of other specified B group vitamins: Secondary | ICD-10-CM | POA: Diagnosis not present

## 2019-02-12 DIAGNOSIS — R2689 Other abnormalities of gait and mobility: Secondary | ICD-10-CM | POA: Diagnosis not present

## 2019-02-12 DIAGNOSIS — R259 Unspecified abnormal involuntary movements: Secondary | ICD-10-CM | POA: Diagnosis not present

## 2019-02-12 DIAGNOSIS — I10 Essential (primary) hypertension: Secondary | ICD-10-CM | POA: Diagnosis not present

## 2019-02-12 DIAGNOSIS — R4189 Other symptoms and signs involving cognitive functions and awareness: Secondary | ICD-10-CM | POA: Diagnosis not present

## 2019-02-12 DIAGNOSIS — R42 Dizziness and giddiness: Secondary | ICD-10-CM | POA: Diagnosis not present

## 2019-02-14 ENCOUNTER — Ambulatory Visit: Payer: PPO

## 2019-02-14 ENCOUNTER — Ambulatory Visit: Payer: PPO | Admitting: Speech Pathology

## 2019-02-14 ENCOUNTER — Encounter: Payer: Self-pay | Admitting: Speech Pathology

## 2019-02-14 VITALS — BP 144/72 | HR 71

## 2019-02-14 DIAGNOSIS — M6281 Muscle weakness (generalized): Secondary | ICD-10-CM

## 2019-02-14 DIAGNOSIS — R41841 Cognitive communication deficit: Secondary | ICD-10-CM

## 2019-02-14 DIAGNOSIS — R2681 Unsteadiness on feet: Secondary | ICD-10-CM

## 2019-02-14 NOTE — Therapy (Signed)
[place] MAIN [facility] SERVICES [address] [place], [place], [address] Phone: [phone]   Fax:  [phone]  Physical Therapy Evaluation  Patient Details  Name: MAKAIO MACH MRN: [id number] Date of Birth: July 05, 1941 Referring Provider (PT): Dr. Sanda Klein   Encounter Date: 02/14/2019  PT End of Session - 02/17/19 2056    Visit Number  1    Number of Visits  25    Date for PT Re-Evaluation  05/09/19    Authorization Type  eval 02/14/19    PT Start Time  1000    PT Stop Time  1100    PT Time Calculation (min)  60 min    Equipment Utilized During Treatment  Gait belt    Activity Tolerance  Patient tolerated treatment well    Behavior During Therapy  [facility] for tasks assessed/performed       Past Medical History:  Diagnosis Date  . 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 Biliary dyskinesia    a.08/2018 s/p lap chole.  [name] BPH (benign prostatic hyperplasia)   . Coronary artery calcification seen on CT scan    a. 11/2015 Cardiac CT: Cor Ca2+ = 892 (80th %'ile). Diffuse Ca2+ in all 3 major coronary arteries; b. 02/2016 MV: HTN response to exercise. Abnl ecg w/ horizontal inflat ST depression, but no ischemia/infarct on imaging; c. 09/2018 MV: EF 64%, no ischemia. Very sm region of mild distal antsept defect, likely 2/2 lbbb.  . Hyperlipidemia   . Hypertension    For irregular heart beat  . Irregular heartbeat/Palpitations    a. Has been taking Atenolol for 20 years  . LBBB (left bundle branch block)   . Left ventricular hypokinesis    a. 09/2018 Echo: EF 45-50%, mod LVH, diff HK. Gr1 DD. Triv AI. Mildly dil Ao root. Mildly reduced RV fxn.  . Lightheadedness   . NSVT (nonsustained ventricular tachycardia) ([place])    a. 11/2018 Zio monitor - 13 beats of NSVT--asymptomatic.  . Orthostatic hypotension    a. 11/2018 pressures improved w/ midodrine/florinef but constant lightheadedness persists.  . Sinus bradycardia    a.  11/2018 Zio monitor: Avg HR 57 (min 28). Intermittent Mobitz I.    Past Surgical History:  Procedure Laterality Date  . CHOLECYSTECTOMY N/A 09/13/2018   Procedure: LAPAROSCOPIC CHOLECYSTECTOMY;  Surgeon: Jules Husbands, MD;  Location: [facility] ORS;  Service: General;  Laterality: N/A;  . COLONOSCOPY  2008  . ESOPHAGOGASTRODUODENOSCOPY (EGD) WITH PROPOFOL N/A 08/20/2018   Procedure: ESOPHAGOGASTRODUODENOSCOPY (EGD) WITH PROPOFOL;  Surgeon: Lucilla Lame, MD;  Location: [facility] ENDOSCOPY;  Service: Endoscopy;  Laterality: N/A;  . TONSILLECTOMY AND ADENOIDECTOMY  1955    Vitals:   02/14/19 1014  BP: (!) 144/72  Pulse: 71  SpO2: 99%     Subjective Assessment - 02/17/19 2027    Subjective  Balance issues    Pertinent History  Pt reports that he has been sick for the last 6 months. He states that he had his gallbladder out and since then he has been having a lot of fatigue and feels like he is "in a fog." Pt reports that he has tried to stay active but it hasn't been helping. He is complaining of cognitive issues as well with difficulty remembering things that he otherwise was able to remember prior to the surgery. He has been dragging his feet and "walking like an old man." He does complain of R sided tremor  primarily. States that neurologist does not believe that he has Parkinson's. He denies feeling dizzy but feels more "air-headed." Denies any true vertigo. Pt states that he takes [facility] for his enlarged prostate however he still wakes up 4-5 times/night to urinate. He denies any snoring while sleeping. He reports decreased libido and erectile dysfunction since the surgery. Pt reports that his PCP has ordered "all kinds of lab tests" to try to determine the source of his fatigue. He stopped taking BP meds approximately 1 month ago with the consent of his MD. BP has been stable since that time. He started taking Vitamin B12 due to low B12 levels and they are now improved. He reports approximately 6  falls in the last 6 months. Denies numbness,tingling, or focal weakness.    Limitations  Walking    Diagnostic tests  MRI (see history)    Patient Stated Goals  Improve his balance and energy    Currently in Pain?  No/denies         [facility] PT Assessment - 02/17/19 2053      Assessment   Medical Diagnosis  Clumsiness, falls frequently, weakness    Referring Provider (PT)  Dr. Sanda Klein    Onset Date/Surgical Date  09/14/19    Hand Dominance  Right    Next MD Visit  Next week    Prior Therapy  Yes, previous therapy for shoulder pain      Balance Screen   Has the patient fallen in the past 6 months  Yes    How many times?  5    Has the patient had a decrease in activity level because of a fear of falling?   Yes    Is the patient reluctant to leave their home because of a fear of falling?   Yes      [place]  Private residence    Living Arrangements  Spouse/significant other    Available Help at Discharge  Family    Type of [place] to enter    Entrance Stairs-Number of Steps  4    Entrance Stairs-Rails  [place]  Multi-level;Laundry or work area in basement;Full bath on main level      Prior Function   Level of Independence  Independent    Vocation  Retired    Therapist, art, Previously worked for [employer], outdoor activities, wood activities      Cognition   Overall Cognitive Status  Impaired/Different from baseline      Standardized Balance Assessment   Standardized Balance Assessment  Berg Balance Test;Timed Up and Go Test;Five Times Sit to [employer] walk test      [employer] to Stand  Able to stand without using hands and stabilize independently    Standing Unsupported  Able to stand safely 2 minutes    Sitting with Back Unsupported but Feet Supported on Floor or Stool  Able to sit safely and securely 2 minutes     Stand to Sit  Sits safely with minimal use of hands    Transfers  Able to transfer safely, minor use of hands    Standing Unsupported with Eyes Closed  Able to stand 10 seconds safely    Standing Ubsupported with Feet Together  Able to place feet together independently and stand 1 minute safely  From Standing, Reach Forward with Outstretched Arm  Can reach confidently >25 cm (10")    From Standing Position, Pick up Object from [place] to pick up shoe safely and easily    From Standing Position, Turn to Look Behind Over each Shoulder  Looks behind from both sides and weight shifts well    Turn 360 Degrees  Able to turn 360 degrees safely in 4 seconds or less    Standing Unsupported, Alternately Place Feet on Step/Stool  Able to stand independently and safely and complete 8 steps in 20 seconds    Standing Unsupported, One Foot in Front  Able to plae foot ahead of the other independently and hold 30 seconds    Standing on One Leg  Tries to lift leg/unable to hold 3 seconds but remains standing independently    Total Score  52      Timed Up and Go Test   TUG  Normal TUG    Normal TUG (seconds)  6.5      Functional Gait  Assessment   Gait assessed   Yes    Gait Level Surface  Walks 20 ft in less than 5.5 sec, no assistive devices, good speed, no evidence for imbalance, normal gait pattern, deviates no more than 6 in outside of the 12 in walkway width.    Change in Gait Speed  Able to smoothly change walking speed without loss of balance or gait deviation. Deviate no more than 6 in outside of the 12 in walkway width.    Gait with Horizontal Head Turns  Performs head turns with moderate changes in gait velocity, slows down, deviates 10-15 in outside 12 in walkway width but recovers, can continue to walk.    Gait with Vertical Head Turns  Performs task with slight change in gait velocity (eg, minor disruption to smooth gait path), deviates 6 - 10 in outside 12 in walkway width or uses  assistive device    Gait and Pivot Turn  Pivot turns safely within 3 sec and stops quickly with no loss of balance.    Step Over Obstacle  Is able to step over 2 stacked shoe boxes taped together (9 in total height) without changing gait speed. No evidence of imbalance.    Gait with Narrow Base of Support  Ambulates 4-7 steps.    Gait with Eyes Closed  Walks 20 ft, slow speed, abnormal gait pattern, evidence for imbalance, deviates 10-15 in outside 12 in walkway width. Requires more than 9 sec to ambulate 20 ft.    Ambulating Backwards  Walks 20 ft, uses assistive device, slower speed, mild gait deviations, deviates 6-10 in outside 12 in walkway width.    Steps  Alternating feet, no rail.    Total Score  22        SUBJECTIVE Chief complaint: Pt reports that he has been sick for the last 6 months. He states that he had his gallbladder out and since then he has been having a lot of fatigue and feels like he is "in a fog." Pt reports that he has tried to stay active but it hasn't been helping. He is complaining of cognitive issues as well with difficulty remembering things that he otherwise was able to remember prior to the surgery. He has been dragging his feet and "walking like an old man." He does complain of R sided tremor primarily. States that neurologist does not believe that he has Parkinson's. He denies feeling dizzy but feels  more "air-headed." Denies any true vertigo. Pt states that he takes [facility] for his enlarged prostate however he still wakes up 4-5 times/night to urinate. He denies any snoring while sleeping. He reports decreased libido and erectile dysfunction since the surgery. Pt reports that his PCP has ordered "all kinds of lab tests" to try to determine the source of his fatigue. He stopped taking BP meds approximately 1 month ago with the consent of his MD. BP has been stable since that time. He started taking Vitamin B12 due to low B12 levels and they are now improved. He  reports approximately 6 falls in the last 6 months. Denies numbness,tingling, or focal weakness. Imaging: MRI brain which pt reports showed signs of a stroke however based on the MRI report the scan appeared normal without signs of CVA.  Directional pattern for falls: Forward Prior history of physical therapy for balance: None Follow-up appointment with MD: Pt states he sees his PCP soon Red flags (bowel/bladder changes, saddle paresthesia, personal history of cancer, chills/fever, night sweats, unrelenting pain) Negative Auditory changes: None Visual changes: Mild decrease in acuity, last visual check 6 months ago, minor change in prescription but not significant and no change in symptoms.    OBJECTIVE  MUSCULOSKELETAL: Tremor: Mild tremor noted in R hand Bulk: Normal Tone: Normal, no clonus  Posture No gross abnormalities noted in standing or seated posture  Gait No gross abnormalities in gait noted  Strength R/L 5/5 Hip flexion 5/5 Hip abduction 5/5 Hip adduction 5/5 Knee extension 5/5 Knee flexion 5/5 Ankle Dorsiflexion    NEUROLOGICAL:  Cranial Nerves Visual acuity and visual fields are intact  Extraocular muscles are intact  Facial strength is intact bilaterally  Hearing is normal as tested by gross conversation Palate elevates midline, normal phonation  Shoulder shrug strength is intact  Tongue protrudes midline   Sensation Grossly intact to light touch bilateral UEs/LEs as determined by testing dermatomes C2-T2/L2-S2 respectively Proprioception and hot/cold testing deferred on this date  Reflexes Deferred  Coordination/Cerebellar Finger to Nose: WNL Heel to Shin: WNL Rapid alternating movements: WNL Finger Opposition: WNL Pronator Drift: Negative   FUNCTIONAL OUTCOME MEASURES   Results Comments  BERG 52/56 Mild deficits  FGA 22/30 Fall risk, in need of intervention  TUG 6.5 seconds WNL  5TSTS 8.25 seconds WNL  10 Meter Gait Speed  Self-selected: 9.3s =1.08  m/s; Fastest: 4.9s = 2.04 m/s WNL  ABC Scale 58.125% Below cut-off, increased risk for falls    POSTURAL CONTROL TESTS   Clinical Test of Sensory Interaction for Balance    (CTSIB):  CONDITION TIME STRATEGY SWAY  Eyes open, firm surface 30 seconds ankle 1+  Eyes closed, firm surface 30 seconds ankle 3+  Eyes open, foam surface 30 seconds ankle 2+  Eyes closed, foam surface 2.8 seconds ankle 4+    OCULOMOTOR / VESTIBULAR6.5 TESTING:  Oculomotor Exam- Room Light  Findings Comments  Ocular Alignment normal   Ocular ROM normal   Spontaneous Nystagmus normal   End-Gaze Nystagmus normal   Smooth Pursuit abnormal Saccadic  Saccades abnormal Mildly hypometric vertically but appears grossly normally horizontally  VOR normal   VOR Cancellation normal   Left Head Thrust normal   Right Head Thrust normal   Head Shaking Nystagmus not examined   Static Acuity not examined   Dynamic Acuity not examined    BPPV TESTS:  Symptoms Duration Intensity Nystagmus  L Dix-Hallpike None   None  R Dix-Hallpike None   None  L Head Roll      R Head Roll      L Sidelying Test      R Sidelying Test                                Objective measurements completed on examination: See above findings.              PT Education - 02/17/19 2105    Education Details  Plan of care    Person(s) Educated  Patient    Methods  Explanation    Comprehension  Verbalized understanding       PT Short Term Goals - 02/14/19 0934      PT SHORT TERM GOAL #1   Title  Pt will be independent with HEP in order to improve strength and balance in order to decrease fall risk and improve function at home and work.     Time  6    Period  Weeks    Status  New    Target Date  03/28/19        PT Long Term Goals - 02/14/19 0935      PT LONG TERM GOAL #1   Title  Pt will improve BERG by at least 3 points in order to demonstrate clinically significant  improvement in balance.      Baseline  02/14/19: 52/56    Time  12    Period  Weeks    Status  New    Target Date  05/09/19      PT LONG TERM GOAL #2   Title  Pt will improve FGA by at least 4 points in order to demonstrate clinically significant improvement in balance and decreased risk for falls.    Baseline  02/14/19: 22/30    Time  12    Period  Weeks    Status  New    Target Date  05/09/19      PT LONG TERM GOAL #3   Title  Pt will improve ABC by at least 13% in order to demonstrate clinically significant improvement in balance confidence.     Baseline  02/14/19: 58.125%    Time  12    Period  Weeks    Status  New    Target Date  05/09/19      PT LONG TERM GOAL #4   Title  Pt will be able to balance with eyes closed on foam for at least 15 seconds in order to demonstrate improved balance in low light conditions to decrease risk for falls    Baseline  02/14/19: 2.8 seconds    Time  12    Period  Weeks    Status  New    Target Date  05/09/19             Plan - 02/17/19 2057    Clinical Impression Statement  Pt is a pleasant 78 year-old male referred for difficulty with balance and weakness. No focal weakness or sensory  noted with testing. 5TSTS, TUG, and 67m gait speed all grossly WNL. BERG is 52/56 with difficulty in tandem and single leg balance. FGA is 22/30 with difficulty performing head turns during ambulation, tandem gait, and eyes closed ambulation. ABC of 58.125% indicates decreased balance confidence. He is only able to balance for 2.8 seconds on foam surface with eyes closed. Pt presents with deficits in balance and he will benefit from  skilled PT services to address these deficits to decrease risk for future falls.    History and Personal Factors relevant to plan of care:  Moderate (evolving): 1-2 personal factors/comorbidities, 3 or more body systems/activity limitations/participation restrictions      Clinical Presentation  Unstable    Clinical Decision Making   Moderate    Rehab Potential  Good    PT Frequency  2x / week    PT Duration  12 weeks    PT Treatment/Interventions  Cryotherapy;Electrical Stimulation;Ultrasound;Moist Heat;Iontophoresis 4mg /ml Dexamethasone;Therapeutic activities;Therapeutic exercise;Patient/family education;Neuromuscular re-education;Manual techniques;ADLs/Self Care Home Management;Aquatic Therapy;Canalith Repostioning;Traction;DME Instruction;Gait training;Stair training;Functional mobility training;Balance training;Passive range of motion;Dry needling;Energy conservation;Vestibular    PT Next Visit Plan  Initiate HEP, progress strength/balance    PT Home Exercise Plan  To be initiated at next visit    Consulted and Agree with Plan of Care  Patient       Patient will benefit from skilled therapeutic intervention in order to improve the following deficits and impairments:  Impaired perceived functional ability, Decreased balance  Visit Diagnosis: Unsteadiness on feet - Plan: PT plan of care cert/re-cert  Muscle weakness (generalized) - Plan: PT plan of care cert/re-cert     Problem List Patient Active Problem List   Diagnosis Date Noted  . Abnormal Holter exam 12/14/2018  . Vitamin B12 deficiency 10/15/2018  . Memory impairment 10/12/2018  . Hx of major depression 10/12/2018  . Biliary dyskinesia   . Protein-calorie malnutrition, severe 08/20/2018  . Gastritis without bleeding   . Duodenitis   . Nausea and vomiting 08/18/2018  . Left bundle branch block 06/25/2018  . Second degree AV block, Mobitz type I 06/25/2018  . Adrenal adenoma 05/09/2017  . Pulmonary nodules 04/19/2017  . Medication monitoring encounter 04/19/2017  . BPH (benign prostatic hyperplasia) 04/19/2017  . Pain in wrist 01/20/2017  . 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 CAD (coronary artery disease) 03/25/2016  . Abnormal EKG 12/16/2015  . Hyperlipidemia 07/07/2015  . Essential hypertension 07/07/2015  . Hyperglycemia  07/07/2015   Phillips Grout PT, DPT, GCS  Huprich,Jason 02/17/2019, 9:19 PM  [place] MAIN [facility] SERVICES [address] [place], [place], [address] Phone: [phone]   Fax:  [phone]  Name: DAYMOND CORDTS MRN: [id number] Date of Birth: [birth year]

## 2019-02-14 NOTE — Therapy (Signed)
[place] MAIN [facility] SERVICES [address] [place], [place], [address] Phone: [phone]   Fax:  [phone]  Speech Language Pathology Treatment  Patient Details  Name: Stephen Stein MRN: [id number] Date of Birth: 1941-03-07 Referring Provider (SLP): Dr. Manuella Ghazi   Encounter Date: 02/14/2019  End of Session - 02/14/19 1125    Visit Number  5    Number of Visits  13    Date for SLP Re-Evaluation  03/11/19    SLP Start Time  0900    SLP Stop Time   0956    SLP Time Calculation (min)  56 min    Activity Tolerance  Patient tolerated treatment well       Past Medical History:  Diagnosis Date  . 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 Biliary dyskinesia    a.08/2018 s/p lap chole.  [name] BPH (benign prostatic hyperplasia)   . Coronary artery calcification seen on CT scan    a. 11/2015 Cardiac CT: Cor Ca2+ = 892 (80th %'ile). Diffuse Ca2+ in all 3 major coronary arteries; b. 02/2016 MV: HTN response to exercise. Abnl ecg w/ horizontal inflat ST depression, but no ischemia/infarct on imaging; c. 09/2018 MV: EF 64%, no ischemia. Very sm region of mild distal antsept defect, likely 2/2 lbbb.  . Hyperlipidemia   . Hypertension    For irregular heart beat  . Irregular heartbeat/Palpitations    a. Has been taking Atenolol for 20 years  . LBBB (left bundle branch block)   . Left ventricular hypokinesis    a. 09/2018 Echo: EF 45-50%, mod LVH, diff HK. Gr1 DD. Triv AI. Mildly dil Ao root. Mildly reduced RV fxn.  . Lightheadedness   . NSVT (nonsustained ventricular tachycardia) ([place])    a. 11/2018 Zio monitor - 13 beats of NSVT--asymptomatic.  . Orthostatic hypotension    a. 11/2018 pressures improved w/ midodrine/florinef but constant lightheadedness persists.  . Sinus bradycardia    a. 11/2018 Zio monitor: Avg HR 57 (min 28). Intermittent Mobitz I.    Past Surgical History:  Procedure Laterality Date  . CHOLECYSTECTOMY  N/A 09/13/2018   Procedure: LAPAROSCOPIC CHOLECYSTECTOMY;  Surgeon: Jules Husbands, MD;  Location: [facility] ORS;  Service: General;  Laterality: N/A;  . COLONOSCOPY  2008  . ESOPHAGOGASTRODUODENOSCOPY (EGD) WITH PROPOFOL N/A 08/20/2018   Procedure: ESOPHAGOGASTRODUODENOSCOPY (EGD) WITH PROPOFOL;  Surgeon: Lucilla Lame, MD;  Location: [facility] ENDOSCOPY;  Service: Endoscopy;  Laterality: N/A;  . TONSILLECTOMY AND ADENOIDECTOMY  1955    There were no vitals filed for this visit.  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 This patient was referred to [facility] by Neurologist for evaluation, due to reported sudden memory issues that began around July/August of 2019. Per admitting H&P, patient's wife reported patient has been having trouble remembering the day of the week, the year, and people's names. This patient does not have history of significant head trauma or family history of Dementia. Per admitting H&P, patient drinks significant amounts of alcohol, though not currently. Pt is going  through a major life event. Patient's memory deficits do not affect his daily functioning. Patient reported frequent feelings of boredom, changes in personality, dizziness or "air headedness" (per patient). This patient also reported heightened sense of smell and taste, at onset of memory issues back in July/August 2019. Patient has had MRI brain done. Patient's wife reported that patient was "not always this talkative," he was previously a "man of few words." Patient reported similarly, reporting he feels his personality has changed and he used to  be more "in control," prior to the memory changes in July/August 2019.        Treatment Provided   Treatment provided  Cognitive-Linquistic      Pain Assessment   Pain Assessment  No/denies pain      Cognitive-Linquistic Treatment   Treatment focused on  Cognition    Skilled Treatment  IDENTIFY COGNITIVE BARRIERS:  The patient identified "it hurts to think", meaning that deep concentration/reasoning was difficult and frustrating.  SUSTAINED CONCENTRATION/REASONING:  Patient reports that the math word problems given last week were relatively simple, but the syllogisms were difficult.  In therapy, the patient required mod cues for the more complex syllogisms.  The patient was able to complete 3 Perplexors with mod cues to recall "rules", reading accuracy, reduce impulsive responses, and reasoning.  ASSIGNMENT: Patient given visual, word, and logic puzzles.      Assessment / Recommendations / Plan   Plan  Continue with current plan of care      Progression Toward Goals   Progression toward goals  Progressing toward goals       SLP Education - 02/14/19 1125    Education Details  Slow down- curb impulsivity    Person(s) Educated  Patient    Methods  Explanation    Comprehension  Verbalized understanding         SLP Long Term Goals - 01/29/19 1635      SLP LONG TERM GOAL #1   Title  Patient will demonstrate functional cognitive-communication skills for independent completion of personal responsibilities and leisure activities.    Time  6    Period  Weeks    Status  New    Target Date  03/11/19      SLP LONG TERM GOAL #2   Title  Patient will identify cognitive-communication barriers and participate in developing functional compensatory strategies.    Time  6    Period  Weeks    Status  New    Target Date  03/11/19      SLP LONG TERM GOAL #3   Title  Patient will complete complex executive function skills tasks with 80% accuracy.    Time  6    Period  Weeks    Status  New     Target Date  03/11/19      SLP LONG TERM GOAL #4   Title  Patient will demonstrate oral/written comprehension for abstract/complex verbal/written information via grammatical, fluent, and cogent sentences to complete abstract/complex linguistic tasks with 80% accuracy.    Time  6    Period  Weeks    Status  New    Target Date  03/11/19       Plan - 02/14/19 1126    Clinical Impression Statement  The patient is receptive to identifying and defining cognitive barriers and impact on his everyday function.  He is actively participating in therapy.  He demonstrates insight into his deficits and is making effective changes.    Speech  Therapy Frequency  2x / week    Duration  Other (comment)    Treatment/Interventions  Cognitive reorganization;Patient/family education    Potential to Achieve Goals  Good    Potential Considerations  Ability to learn/carryover information;Family/community support;Previous level of function;Cooperation/participation level    SLP Home Exercise Plan  Provided    Consulted and Agree with Plan of Care  Patient       Patient will benefit from skilled therapeutic intervention in order to improve the following deficits and impairments:   Cognitive communication deficit    Problem List Patient Active Problem List   Diagnosis Date Noted  . Abnormal Holter exam 12/14/2018  . Vitamin B12 deficiency 10/15/2018  . Memory impairment 10/12/2018  . Hx of major depression 10/12/2018  . Biliary dyskinesia   . Protein-calorie malnutrition, severe 08/20/2018  . Gastritis without bleeding   . Duodenitis   . Nausea and vomiting 08/18/2018  . Left bundle branch block 06/25/2018  . Second degree AV block, Mobitz type I 06/25/2018  . Adrenal adenoma 05/09/2017  . Pulmonary nodules 04/19/2017  . Medication monitoring encounter 04/19/2017  . BPH (benign prostatic hyperplasia) 04/19/2017  . Pain in wrist 01/20/2017  . 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 CAD  (coronary artery disease) 03/25/2016  . Abnormal EKG 12/16/2015  . Hyperlipidemia 07/07/2015  . Essential hypertension 07/07/2015  . Hyperglycemia 07/07/2015   Leroy Sea, MS/CCC- SLP  Lou Miner 02/14/2019, 11:28 AM  [place] MAIN [facility] SERVICES [address] [place], [place], [address] Phone: [phone]   Fax:  [phone]   Name: Stephen Stein MRN: [id number] Date of Birth: 09-Sep-1941

## 2019-02-15 ENCOUNTER — Ambulatory Visit: Payer: PPO

## 2019-02-18 ENCOUNTER — Ambulatory Visit
Admission: RE | Admit: 2019-02-18 | Discharge: 2019-02-18 | Disposition: A | Discharge status: Home / Self Care | Payer: PPO | Source: Ambulatory Visit | Attending: Family Medicine | Admitting: Family Medicine

## 2019-02-18 DIAGNOSIS — R930 Abnormal findings on diagnostic imaging of skull and head, not elsewhere classified: Secondary | ICD-10-CM | POA: Insufficient documentation

## 2019-02-18 DIAGNOSIS — I6523 Occlusion and stenosis of bilateral carotid arteries: Secondary | ICD-10-CM | POA: Diagnosis not present

## 2019-02-18 DIAGNOSIS — R2689 Other abnormalities of gait and mobility: Secondary | ICD-10-CM | POA: Insufficient documentation

## 2019-02-18 DIAGNOSIS — R413 Other amnesia: Secondary | ICD-10-CM | POA: Insufficient documentation

## 2019-02-19 ENCOUNTER — Ambulatory Visit: Payer: PPO | Admitting: Speech Pathology

## 2019-02-19 ENCOUNTER — Ambulatory Visit: Payer: PPO

## 2019-02-19 VITALS — BP 135/72 | HR 70

## 2019-02-19 DIAGNOSIS — R41841 Cognitive communication deficit: Secondary | ICD-10-CM

## 2019-02-19 DIAGNOSIS — R2681 Unsteadiness on feet: Secondary | ICD-10-CM

## 2019-02-19 NOTE — Patient Instructions (Signed)
Access Code: [email or web address]  URL: [email or web address]  Date: 02/19/2019  Prepared by: Roxana Hires   Exercises  Tandem Stance with Head Rotation - 30 seconds x 3 with each foot forward hold - 2x daily - 7x weekly  Tandem Stance - 30 seconds x 3 with each leg forward hold - 2x daily - 7x weekly

## 2019-02-19 NOTE — Therapy (Signed)
[place] MAIN [facility] SERVICES [address] [place], [place], [address] Phone: [phone]   Fax:  [phone]  Physical Therapy Treatment  Patient Details  Name: Stephen Stein MRN: [id number] Date of Birth: 1941/10/10 Referring Provider (PT): Dr. Sanda Klein   Encounter Date: 02/19/2019  PT End of Session - 02/19/19 1029    Visit Number  2    Number of Visits  25    Date for PT Re-Evaluation  05/09/19    Authorization Type  eval 02/14/19    PT Start Time  1025    PT Stop Time  1110    PT Time Calculation (min)  45 min    Equipment Utilized During Treatment  Gait belt    Activity Tolerance  Patient tolerated treatment well    Behavior During Therapy  [facility] for tasks assessed/performed       Past Medical History:  Diagnosis Date  . 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 Biliary dyskinesia    a.08/2018 s/p lap chole.  [name] BPH (benign prostatic hyperplasia)   . Coronary artery calcification seen on CT scan    a. 11/2015 Cardiac CT: Cor Ca2+ = 892 (80th %'ile). Diffuse Ca2+ in all 3 major coronary arteries; b. 02/2016 MV: HTN response to exercise. Abnl ecg w/ horizontal inflat ST depression, but no ischemia/infarct on imaging; c. 09/2018 MV: EF 64%, no ischemia. Very sm region of mild distal antsept defect, likely 2/2 lbbb.  . Hyperlipidemia   . Hypertension    For irregular heart beat  . Irregular heartbeat/Palpitations    a. Has been taking Atenolol for 20 years  . LBBB (left bundle branch block)   . Left ventricular hypokinesis    a. 09/2018 Echo: EF 45-50%, mod LVH, diff HK. Gr1 DD. Triv AI. Mildly dil Ao root. Mildly reduced RV fxn.  . Lightheadedness   . NSVT (nonsustained ventricular tachycardia) ([place])    a. 11/2018 Zio monitor - 13 beats of NSVT--asymptomatic.  . Orthostatic hypotension    a. 11/2018 pressures improved w/ midodrine/florinef but constant lightheadedness persists.  . Sinus bradycardia    a.  11/2018 Zio monitor: Avg HR 57 (min 28). Intermittent Mobitz I.    Past Surgical History:  Procedure Laterality Date  . CHOLECYSTECTOMY N/A 09/13/2018   Procedure: LAPAROSCOPIC CHOLECYSTECTOMY;  Surgeon: Jules Husbands, MD;  Location: [facility] ORS;  Service: General;  Laterality: N/A;  . COLONOSCOPY  2008  . ESOPHAGOGASTRODUODENOSCOPY (EGD) WITH PROPOFOL N/A 08/20/2018   Procedure: ESOPHAGOGASTRODUODENOSCOPY (EGD) WITH PROPOFOL;  Surgeon: Lucilla Lame, MD;  Location[facility] END[facility]COPY;  Service: Endoscopy;  Laterality: N/A;  . TONSILLECTOMY AND ADENOIDECTOMY  1955    Vitals:   02/19/19 1029  BP: 135/72  Pulse: 70  SpO2: 97%    Subjective Assessment - 02/19/19 1029    Subjective  Pt reports that he is doing well today. No pain currently. No specific questions or concerns at this time.     Pertinent History  Pt reports that he has been sick for the last 6 months. He states that he had his gallbladder out and since then he has been having a lot of fatigue and feels like he is "in a fog." Pt reports that he has tried to stay active but it hasn't been helping. He is complaining of cognitive issues as well with difficulty remembering things that he otherwise was able to remember prior to the surgery. He has been  dragging his feet and "walking like an old man." He does complain of R sided tremor primarily. States that neurologist does not believe that he has Parkinson's. He denies feeling dizzy but feels more "air-headed." Denies any true vertigo. Pt states that he takes [facility] for his enlarged prostate however he still wakes up 4-5 times/night to urinate. He denies any snoring while sleeping. He reports decreased libido and erectile dysfunction since the surgery. Pt reports that his PCP has ordered "all kinds of lab tests" to try to determine the source of his fatigue. He stopped taking BP meds approximately 1 month ago with the consent of his MD. BP has been stable since that time. He started taking  Vitamin B12 due to low B12 levels and they are now improved. He reports approximately 6 falls in the last 6 months. Denies numbness,tingling, or focal weakness.    Limitations  Walking    Diagnostic tests  MRI (see history)    Patient Stated Goals  Improve his balance and energy    Currently in Pain?  No/denies          TREATMENT   Ther-ex  Octane HIIT L8 x 30s, L4 x 60s, 5 minutes total for warm-up during history (4 minutes unbilled); Quantum leg press 150# x 20, 165# x 20, 180# x 20 (pt reaching fatigue during third set);    Neuromuscular Re-education  6" forward step taps alternating LE x 10 each; 6" lateral step taps x 10 each side; Airex 6" step taps alternating LE x 10 each; Airex WBOS with eyes closed x 30s; Airex NBOS with eyes closed x 30s; Airex NBOS with eyes open and ball passes around body with head/eye follow x 10 each direction; Sensation testing with monofilament to feet, intact to 5.07, mostly intact to 4.31. Forward ambulation in hallway with vertical ball toss with head/eye follow 75' x 2; Forward ambulation in hallway with horizontal ball toss with therapyst 75' x 2 each direction;    Pt educated throughout session about proper posture and technique with exercises. Improved exercise technique, movement at target joints, use of target muscles after min to mod verbal, visual, tactile cues.    Pt demonstrates difficulty grading motor contractions and moves exceedingly fast and uncontrolled at times. He also occasionally asks questions that are not completely in line with the direction of the conversation which is slightly confusion. He demonstrates improved stability today with eyes closed on unstable surface compared to initial evaluation. Sensation of both feet performed and is intact to 10g/5.07 monofilament. He demonstrates significant difficulty with vertical ball tosses during ambulation. Tested vertical gaze again and her ocular ROM appears grossly WNL in the  vertical plane. Saccade and smooth pursuit testing remains abnormal with brief testing. Pt will benefit from PT services to address deficits in strength, balance, and mobility in order to return to full function at home.                  PT Short Term Goals - 02/14/19 0934      PT SHORT TERM GOAL #1   Title  Pt will be independent with HEP in order to improve strength and balance in order to decrease fall risk and improve function at home and work.     Time  6    Period  Weeks    Status  New    Target Date  03/28/19        PT Long Term Goals - 02/14/19 1638  PT LONG TERM GOAL #1   Title  Pt will improve BERG by at least 3 points in order to demonstrate clinically significant improvement in balance.      Baseline  02/14/19: 52/56    Time  12    Period  Weeks    Status  New    Target Date  05/09/19      PT LONG TERM GOAL #2   Title  Pt will improve FGA by at least 4 points in order to demonstrate clinically significant improvement in balance and decreased risk for falls.    Baseline  02/14/19: 22/30    Time  12    Period  Weeks    Status  New    Target Date  05/09/19      PT LONG TERM GOAL #3   Title  Pt will improve ABC by at least 13% in order to demonstrate clinically significant improvement in balance confidence.     Baseline  02/14/19: 58.125%    Time  12    Period  Weeks    Status  New    Target Date  05/09/19      PT LONG TERM GOAL #4   Title  Pt will be able to balance with eyes closed on foam for at least 15 seconds in order to demonstrate improved balance in low light conditions to decrease risk for falls    Baseline  02/14/19: 2.8 seconds    Time  12    Period  Weeks    Status  New    Target Date  05/09/19            Plan - 02/19/19 1038    Clinical Impression Statement  Pt demonstrates difficulty grading motor contractions and moves exceedingly fast and uncontrolled at times. He also occasionally asks questions that are not completely  in line with the direction of the conversation which is slightly confusion. He demonstrates improved stability today with eyes closed on unstable surface compared to initial evaluation. Sensation of both feet performed and is intact to 10g/5.07 monofilament. He demonstrates significant difficulty with vertical ball tosses during ambulation. Tested vertical gaze again and her ocular ROM appears grossly WNL in the vertical plane. Saccade and smooth pursuit testing remains abnormal with brief testing. Pt will benefit from PT services to address deficits in strength, balance, and mobility in order to return to full function at home.     Rehab Potential  Good    PT Frequency  2x / week    PT Duration  12 weeks    PT Treatment/Interventions  Cryotherapy;Electrical Stimulation;Ultrasound;Moist Heat;Iontophoresis 4mg /ml Dexamethasone;Therapeutic activities;Therapeutic exercise;Patient/family education;Neuromuscular re-education;Manual techniques;ADLs/Self Care Home Management;Aquatic Therapy;Canalith Repostioning;Traction;DME Instruction;Gait training;Stair training;Functional mobility training;Balance training;Passive range of motion;Dry needling;Energy conservation;Vestibular    PT Next Visit Plan  Progress strength/balance    PT Home Exercise Plan  Tandem balance, semitandem balance with horizontal head turns    Consulted and Agree with Plan of Care  Patient       Patient will benefit from skilled therapeutic intervention in order to improve the following deficits and impairments:  Impaired perceived functional ability, Decreased balance  Visit Diagnosis: Unsteadiness on feet     Problem List Patient Active Problem List   Diagnosis Date Noted  . Abnormal Holter exam 12/14/2018  . Vitamin B12 deficiency 10/15/2018  . Memory impairment 10/12/2018  . Hx of major depression 10/12/2018  . Biliary dyskinesia   . Protein-calorie malnutrition, severe 08/20/2018  . Gastritis without bleeding   .  Duodenitis   . Nausea and vomiting 08/18/2018  . Left bundle branch block 06/25/2018  . Second degree AV block, Mobitz type I 06/25/2018  . Adrenal adenoma 05/09/2017  . Pulmonary nodules 04/19/2017  . Medication monitoring encounter 04/19/2017  . BPH (benign prostatic hyperplasia) 04/19/2017  . Pain in wrist 01/20/2017  . 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 CAD (coronary artery disease) 03/25/2016  . Abnormal EKG 12/16/2015  . Hyperlipidemia 07/07/2015  . Essential hypertension 07/07/2015  . Hyperglycemia 07/07/2015    Janeshia Ciliberto 02/19/2019, 3:54 PM  [place] MAIN [facility] SERVICES [address] [place], [place], [address] Phone: [phone]   Fax:  [phone]  Name: Stephen Stein MRN: [id number] Date of Birth: 1941-11-24

## 2019-02-20 ENCOUNTER — Encounter: Payer: Self-pay | Admitting: Family Medicine

## 2019-02-20 DIAGNOSIS — I6523 Occlusion and stenosis of bilateral carotid arteries: Secondary | ICD-10-CM

## 2019-02-20 HISTORY — DX: Occlusion and stenosis of bilateral carotid arteries: I65.23

## 2019-02-20 NOTE — Therapy (Signed)
[place] MAIN [facility] SERVICES [address] [place], [place], [address] Phone: [phone]   Fax:  [phone]  Speech Language Pathology Treatment  Patient Details  Name: Stephen Stein MRN: [id number] Date of Birth: October 24, 1941 Referring Provider (SLP): Dr. Manuella Ghazi   Encounter Date: 02/19/2019  End of Session - 02/20/19 0825    Visit Number  6    Number of Visits  13    Date for SLP Re-Evaluation  03/11/19    SLP Start Time  0900    SLP Stop Time   0955    SLP Time Calculation (min)  55 min    Activity Tolerance  Patient tolerated treatment well       Past Medical History:  Diagnosis Date  . 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 Biliary dyskinesia    a.08/2018 s/p lap chole.  [name] BPH (benign prostatic hyperplasia)   . Carotid atherosclerosis, bilateral 02/20/2019   Feb 2020  . Coronary artery calcification seen on CT scan    a. 11/2015 Cardiac CT: Cor Ca2+ = 892 (80th %'ile). Diffuse Ca2+ in all 3 major coronary arteries; b. 02/2016 MV: HTN response to exercise. Abnl ecg w/ horizontal inflat ST depression, but no ischemia/infarct on imaging; c. 09/2018 MV: EF 64%, no ischemia. Very sm region of mild distal antsept defect, likely 2/2 lbbb.  . Hyperlipidemia   . Hypertension    For irregular heart beat  . Irregular heartbeat/Palpitations    a. Has been taking Atenolol for 20 years  . LBBB (left bundle branch block)   . Left ventricular hypokinesis    a. 09/2018 Echo: EF 45-50%, mod LVH, diff HK. Gr1 DD. Triv AI. Mildly dil Ao root. Mildly reduced RV fxn.  . Lightheadedness   . NSVT (nonsustained ventricular tachycardia) ([place])    a. 11/2018 Zio monitor - 13 beats of NSVT--asymptomatic.  . Orthostatic hypotension    a. 11/2018 pressures improved w/ midodrine/florinef but constant lightheadedness persists.  . Sinus bradycardia    a. 11/2018 Zio monitor: Avg HR 57 (min 28). Intermittent Mobitz I.    Past  Surgical History:  Procedure Laterality Date  . CHOLECYSTECTOMY N/A 09/13/2018   Procedure: LAPAROSCOPIC CHOLECYSTECTOMY;  Surgeon: Jules Husbands, MD;  Location: [facility] ORS;  Service: General;  Laterality: N/A;  . COLONOSCOPY  2008  . ESOPHAGOGASTRODUODENOSCOPY (EGD) WITH PROPOFOL N/A 08/20/2018   Procedure: ESOPHAGOGASTRODUODENOSCOPY (EGD) WITH PROPOFOL;  Surgeon: Lucilla Lame, MD;  Location: [facility] ENDOSCOPY;  Service: Endoscopy;  Laterality: N/A;  . TONSILLECTOMY AND ADENOIDECTOMY  1955    There were no vitals filed for this visit.  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 This patient was referred to [place] by Neurologist for evaluation, due to reported sudden memory issues that began around July/August of 2019. Per admitting H&P, patient's wife reported patient has been having trouble remembering the day of the week, the year, and people's names. This patient does not have history of significant head trauma or family history of Dementia. Per admitting H&P, patient drinks  significant amounts of alcohol, though not currently. Pt is going through a major life event. Patient's memory deficits do not affect his daily functioning. Patient reported frequent feelings of boredom, changes in personality, dizziness or "air headedness" (per patient). This patient also reported heightened sense of smell and taste, at onset of memory issues back in July/August 2019. Patient has had MRI brain done. Patient's wife reported that patient was "not always this talkative," he was previously a "man of few words." Patient reported  similarly, reporting he feels his personality has changed and he used to be more "in control," prior to the memory changes in July/August 2019.        Treatment Provided   Treatment provided  Cognitive-Linquistic      Pain Assessment   Pain Assessment  No/denies pain      Cognitive-Linquistic Treatment   Treatment focused on  Cognition    Skilled Treatment  IDENTIFY COGNITIVE BARRIERS:  The patient identified "it hurts to think", meaning that deep concentration/reasoning is difficult and frustrating.  The patient agrees that his impulsive/rapid response to stimuli is counterproductive.  SUSTAINED CONCENTRATION/REASONING:  The patient returns with partially completed puzzles given last time.  The patient had minimal difficulty with visual puzzles.  He had moderate difficulty with word puzzles but improved with minimal cues to explain task.  The patient is not able to complete logic puzzles independently.  ASSIGNMENT: Patient given word puzzles to complete.      Assessment / Recommendations / Plan   Plan  Continue with current plan of care      Progression Toward Goals   Progression toward goals  Progressing toward goals       SLP Education - 02/20/19 0824    Education Details  Slow down- curb impulsivity    Person(s) Educated  Patient    Comprehension  Verbalized understanding         SLP Long Term Goals - 01/29/19 1635      SLP LONG TERM GOAL #1   Title  Patient will demonstrate functional cognitive-communication skills for independent completion of personal responsibilities and leisure activities.    Time  6    Period  Weeks    Status  New    Target Date  03/11/19      SLP LONG TERM GOAL #2   Title  Patient will identify cognitive-communication barriers and participate in developing functional compensatory strategies.    Time  6    Period  Weeks    Status  New    Target Date  03/11/19      SLP LONG TERM GOAL #3   Title  Patient will complete complex executive function  skills tasks with 80% accuracy.    Time  6    Period  Weeks    Status  New    Target Date  03/11/19      SLP LONG TERM GOAL #4   Title  Patient will demonstrate oral/written comprehension for abstract/complex verbal/written information via grammatical, fluent, and cogent sentences to complete abstract/complex linguistic tasks with 80% accuracy.    Time  6    Period  Weeks    Status  New    Target Date  03/11/19       Plan - 02/20/19 9678    Clinical Impression Statement  The patient is receptive to identifying and defining cognitive barriers and impact on his everyday function.  He is actively participating in therapy.  He demonstrates insight into his deficits and  is making effective changes.    Speech Therapy Frequency  2x / week    Duration  Other (comment)    Treatment/Interventions  Cognitive reorganization;Patient/family education    Potential to Achieve Goals  Good    Potential Considerations  Ability to learn/carryover information;Family/community support;Previous level of function;Cooperation/participation level    SLP Home Exercise Plan  Provided    Consulted and Agree with Plan of Care  Patient       Patient will benefit from skilled therapeutic intervention in order to improve the following deficits and impairments:   Cognitive communication deficit    Problem List Patient Active Problem List   Diagnosis Date Noted  . Carotid atherosclerosis, bilateral 02/20/2019  . Abnormal Holter exam 12/14/2018  . Vitamin B12 deficiency 10/15/2018  . Memory impairment 10/12/2018  . Hx of major depression 10/12/2018  . Biliary dyskinesia   . Protein-calorie malnutrition, severe 08/20/2018  . Gastritis without bleeding   . Duodenitis   . Nausea and vomiting 08/18/2018  . Left bundle branch block 06/25/2018  . Second degree AV block, Mobitz type I 06/25/2018  . Adrenal adenoma 05/09/2017  . Pulmonary nodules 04/19/2017  . Medication monitoring encounter 04/19/2017  . BPH  (benign prostatic hyperplasia) 04/19/2017  . Pain in wrist 01/20/2017  . 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 CAD (coronary artery disease) 03/25/2016  . Abnormal EKG 12/16/2015  . Hyperlipidemia 07/07/2015  . Essential hypertension 07/07/2015  . Hyperglycemia 07/07/2015   Leroy Sea, MS/CCC- SLP  Lou Miner 02/20/2019, 8:30 AM  [place] MAIN [facility] SERVICES [address] [place], [place], [address] Phone: [phone]   Fax:  [phone]   Name: GREGG WINCHELL MRN: [id number] Date of Birth: July 31, 1941

## 2019-02-20 NOTE — Telephone Encounter (Signed)
-----   Message from Phillips Grout, PT sent at 02/17/2019  9:21 PM EST ----- Regarding: Information regarding Mr. Lamagna Dr. Sanda Klein,  Thanks for your referral of Mr. Paddock to physical therapy. He is an interesting gentleman and honestly the frequency with which he falls is slightly confounding to me based on the fact that in general he is pretty high functioning. He spent a lot of time speaking with me regarding the frustration surrounding his fatigue. I know this patient has a very complex history and has seen multiple specialists. He reported great confidence that you have been very thorough in trying to work-up these symptoms. He told me that he recently started an antidepressant and I encouraged him to talk to you about a possible referral for cognitive behavioral therapy. I also wanted to pass along that in addition to the fatigue and cognitive issues pt complained to me about erectile dysfunction and decreased libido. He also is reporting that he sleeps 9 hours a night but wakes up 4-5 times every night to urinate so I know his sleep is quite disrupted. I just wanted you to be aware that he shared this with me because I wasn't sure how much he had shared regarding these issues with you. Overall I feel sympathetic toward the frustration he is experiencing and will do my best to help him integrate exercise and balance therapy to see if we can alleviate some of his fatigue and improve his balance. Please feel free to contact me if you have any questions at all. Thanks again!

## 2019-02-21 ENCOUNTER — Ambulatory Visit: Payer: PPO | Admitting: Speech Pathology

## 2019-02-21 ENCOUNTER — Ambulatory Visit: Payer: PPO | Admitting: Internal Medicine

## 2019-02-21 DIAGNOSIS — R41841 Cognitive communication deficit: Secondary | ICD-10-CM

## 2019-02-21 NOTE — Therapy (Signed)
[place] MAIN [facility] SERVICES [address] [place], [place], [address] Phone: [phone]   Fax:  [phone]  Speech Language Pathology Treatment  Patient Details  Name: Stephen Stein MRN: [id number] Date of Birth: 05-27-1941 Referring Provider (SLP): Dr. Manuella Ghazi   Encounter Date: 02/21/2019  End of Session - 02/21/19 1359    Visit Number  7    Number of Visits  13    Date for SLP Re-Evaluation  03/11/19    SLP Start Time  0930    SLP Stop Time   1026    SLP Time Calculation (min)  56 min    Activity Tolerance  Patient tolerated treatment well       Past Medical History:  Diagnosis Date  . 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 Biliary dyskinesia    a.08/2018 s/p lap chole.  [name] BPH (benign prostatic hyperplasia)   . Carotid atherosclerosis, bilateral 02/20/2019   Feb 2020  . Coronary artery calcification seen on CT scan    a. 11/2015 Cardiac CT: Cor Ca2+ = 892 (80th %'ile). Diffuse Ca2+ in all 3 major coronary arteries; b. 02/2016 MV: HTN response to exercise. Abnl ecg w/ horizontal inflat ST depression, but no ischemia/infarct on imaging; c. 09/2018 MV: EF 64%, no ischemia. Very sm region of mild distal antsept defect, likely 2/2 lbbb.  . Hyperlipidemia   . Hypertension    For irregular heart beat  . Irregular heartbeat/Palpitations    a. Has been taking Atenolol for 20 years  . LBBB (left bundle branch block)   . Left ventricular hypokinesis    a. 09/2018 Echo: EF 45-50%, mod LVH, diff HK. Gr1 DD. Triv AI. Mildly dil Ao root. Mildly reduced RV fxn.  . Lightheadedness   . NSVT (nonsustained ventricular tachycardia) ([place])    a. 11/2018 Zio monitor - 13 beats of NSVT--asymptomatic.  . Orthostatic hypotension    a. 11/2018 pressures improved w/ midodrine/florinef but constant lightheadedness persists.  . Sinus bradycardia    a. 11/2018 Zio monitor: Avg HR 57 (min 28). Intermittent Mobitz I.    Past  Surgical History:  Procedure Laterality Date  . CHOLECYSTECTOMY N/A 09/13/2018   Procedure: LAPAROSCOPIC CHOLECYSTECTOMY;  Surgeon: Jules Husbands, MD;  Location: [facility] ORS;  Service: General;  Laterality: N/A;  . COLONOSCOPY  2008  . ESOPHAGOGASTRODUODENOSCOPY (EGD) WITH PROPOFOL N/A 08/20/2018   Procedure: ESOPHAGOGASTRODUODENOSCOPY (EGD) WITH PROPOFOL;  Surgeon: Lucilla Lame, MD;  Location: [facility] ENDOSCOPY;  Service: Endoscopy;  Laterality: N/A;  . TONSILLECTOMY AND ADENOIDECTOMY  1955    There were no vitals filed for this visit.  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 This patient was referred to [place] by Neurologist for evaluation, due to reported sudden memory issues that began around July/August of 2019. Per admitting H&P, patient's wife reported patient has been having trouble remembering the day of the week, the year, and people's names. This patient does not have history of significant head trauma or family history of Dementia. Per admitting  H&P, patient drinks significant amounts of alcohol, though not currently. Pt is going through a major life event. Patient's memory deficits do not affect his daily functioning. Patient reported frequent feelings of boredom, changes in personality, dizziness or "air headedness" (per patient). This patient also reported heightened sense of smell and taste, at onset of memory issues back in July/August 2019. Patient has had MRI brain done. Patient's wife reported that patient was "not always this talkative," he was previously a "man of few words." Patient  reported similarly, reporting he feels his personality has changed and he used to be more "in control," prior to the memory changes in July/August 2019.    (Pended)       Treatment Provided   Treatment provided  Cognitive-Linquistic  (Pended)       Pain Assessment   Pain Assessment  No/denies pain  (Pended)       Cognitive-Linquistic Treatment   Treatment focused on  Cognition  (Pended)     Skilled Treatment  IDENTIFY COGNITIVE BARRIERS:  The patient identified "it hurts to think", meaning that deep concentration/reasoning is difficult and frustrating.  The patient agrees that his impulsive/rapid response to stimuli is counterproductive.  SUSTAINED CONCENTRATION/REASONING:  The patient returns with partially completed puzzles given last time.  The patient had minimal difficulty with visual puzzles.  He had moderate difficulty with word puzzles but improved with minimal cues to explain task.  The patient is not able to complete logic puzzles independently.  Patient required fewer cues to complete logic puzzles worked last session.  ASSIGNMENT: Patient given simple cross word puzzle to complete.  Given note to work slowly and be sure his writing is legible.  (Pended)       Assessment / Recommendations / Plan   Plan  Continue with current plan of care  (Pended)       Progression Toward Goals   Progression toward goals  Progressing toward goals  (Pended)        SLP Education - 02/21/19 1359    Education Details  Slow down- curb impulsivity    Person(s) Educated  Patient    Methods  Explanation    Comprehension  Verbalized understanding         SLP Long Term Goals - 01/29/19 1635      SLP LONG TERM GOAL #1   Title  Patient will demonstrate functional cognitive-communication skills for independent completion of personal responsibilities and leisure activities.    Time  6    Period  Weeks    Status  New    Target Date  03/11/19      SLP LONG TERM GOAL #2   Title  Patient will  identify cognitive-communication barriers and participate in developing functional compensatory strategies.    Time  6    Period  Weeks    Status  New    Target Date  03/11/19      SLP LONG TERM GOAL #3   Title  Patient will complete complex executive function skills tasks with 80% accuracy.    Time  6    Period  Weeks    Status  New    Target Date  03/11/19      SLP LONG TERM GOAL #4   Title  Patient will demonstrate oral/written comprehension for abstract/complex verbal/written information via grammatical, fluent, and cogent sentences to complete abstract/complex linguistic tasks with 80% accuracy.    Time  6    Period  Weeks    Status  New    Target Date  03/11/19       Plan - 02/21/19 1406    Clinical Impression Statement  The patient is receptive to identifying and defining cognitive barriers and impact on his everyday function.  He is actively participating in therapy.  He demonstrates insight into his deficits and is making effective changes.  He is talking significantly less and reports insight into why he was talking so voluminously.     Speech Therapy Frequency  2x / week    Duration  Other (comment)    Treatment/Interventions  Cognitive reorganization;Patient/family education    Potential to Achieve Goals  Good    Potential Considerations  Ability to learn/carryover information;Family/community support;Previous level of function;Cooperation/participation level    SLP Home Exercise Plan  Provided    Consulted and Agree with Plan of Care  Patient       Patient will benefit from skilled therapeutic intervention in order to improve the following deficits and impairments:   Cognitive communication deficit    Problem List Patient Active Problem List   Diagnosis Date Noted  . Carotid atherosclerosis, bilateral 02/20/2019  . Abnormal Holter exam 12/14/2018  . Vitamin B12 deficiency 10/15/2018  . Memory impairment 10/12/2018  . Hx of major depression 10/12/2018  .  Biliary dyskinesia   . Protein-calorie malnutrition, severe 08/20/2018  . Gastritis without bleeding   . Duodenitis   . Nausea and vomiting 08/18/2018  . Left bundle branch block 06/25/2018  . Second degree AV block, Mobitz type I 06/25/2018  . Adrenal adenoma 05/09/2017  . Pulmonary nodules 04/19/2017  . Medication monitoring encounter 04/19/2017  . BPH (benign prostatic hyperplasia) 04/19/2017  . Pain in wrist 01/20/2017  . 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 CAD (coronary artery disease) 03/25/2016  . Abnormal EKG 12/16/2015  . Hyperlipidemia 07/07/2015  . Essential hypertension 07/07/2015  . Hyperglycemia 07/07/2015   Leroy Sea, MS/CCC- SLP  Lou Miner 02/21/2019, 2:08 PM  [place] MAIN [facility] SERVICES [address] [place], [place], [address] Phone: [phone]   Fax:  [phone]   Name: MUHSIN DORIS MRN: [id number] Date of Birth: [birth year]

## 2019-02-26 ENCOUNTER — Ambulatory Visit: Payer: PPO | Attending: Neurology | Admitting: Speech Pathology

## 2019-02-26 ENCOUNTER — Encounter: Payer: Self-pay | Admitting: Physical Therapy

## 2019-02-26 ENCOUNTER — Ambulatory Visit: Payer: PPO | Admitting: Physical Therapy

## 2019-02-26 ENCOUNTER — Encounter: Payer: Self-pay | Admitting: Speech Pathology

## 2019-02-26 DIAGNOSIS — R2681 Unsteadiness on feet: Secondary | ICD-10-CM | POA: Diagnosis not present

## 2019-02-26 DIAGNOSIS — M25511 Pain in right shoulder: Secondary | ICD-10-CM | POA: Diagnosis not present

## 2019-02-26 DIAGNOSIS — R41841 Cognitive communication deficit: Secondary | ICD-10-CM | POA: Diagnosis not present

## 2019-02-26 DIAGNOSIS — M25571 Pain in right ankle and joints of right foot: Secondary | ICD-10-CM | POA: Insufficient documentation

## 2019-02-26 DIAGNOSIS — G8929 Other chronic pain: Secondary | ICD-10-CM | POA: Diagnosis not present

## 2019-02-26 DIAGNOSIS — M25512 Pain in left shoulder: Secondary | ICD-10-CM | POA: Insufficient documentation

## 2019-02-26 DIAGNOSIS — M6281 Muscle weakness (generalized): Secondary | ICD-10-CM | POA: Insufficient documentation

## 2019-02-26 NOTE — Therapy (Signed)
[place] MAIN [facility] SERVICES [address] [place], [place], [address] Phone: [phone]   Fax:  [phone]  Physical Therapy Treatment  Patient Details  Name: Stephen Stein MRN: [id number] Date of Birth: [birth year] Referring Provider (PT): Dr. Sanda Klein   Encounter Date: 02/26/2019  PT End of Session - 02/26/19 1020    Visit Number  3    Number of Visits  25    Date for PT Re-Evaluation  05/09/19    Authorization Type  eval 02/14/19    PT Start Time  1015    PT Stop Time  1100    PT Time Calculation (min)  45 min    Equipment Utilized During Treatment  Gait belt    Activity Tolerance  Patient tolerated treatment well    Behavior During Therapy  [facility] for tasks assessed/performed       Past Medical History:  Diagnosis Date  . 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 Biliary dyskinesia    a.08/2018 s/p lap chole.  [name] BPH (benign prostatic hyperplasia)   . Carotid atherosclerosis, bilateral 02/20/2019   Feb 2020  . Coronary artery calcification seen on CT scan    a. 11/2015 Cardiac CT: Cor Ca2+ = 892 (80th %'ile). Diffuse Ca2+ in all 3 major coronary arteries; b. 02/2016 MV: HTN response to exercise. Abnl ecg w/ horizontal inflat ST depression, but no ischemia/infarct on imaging; c. 09/2018 MV: EF 64%, no ischemia. Very sm region of mild distal antsept defect, likely 2/2 lbbb.  . Hyperlipidemia   . Hypertension    For irregular heart beat  . Irregular heartbeat/Palpitations    a. Has been taking Atenolol for 20 years  . LBBB (left bundle branch block)   . Left ventricular hypokinesis    a. 09/2018 Echo: EF 45-50%, mod LVH, diff HK. Gr1 DD. Triv AI. Mildly dil Ao root. Mildly reduced RV fxn.  . Lightheadedness   . NSVT (nonsustained ventricular tachycardia) ([place])    a. 11/2018 Zio monitor - 13 beats of NSVT--asymptomatic.  . Orthostatic hypotension    a. 11/2018 pressures improved w/ midodrine/florinef but constant  lightheadedness persists.  . Sinus bradycardia    a. 11/2018 Zio monitor: Avg HR 57 (min 28). Intermittent Mobitz I.    Past Surgical History:  Procedure Laterality Date  . CHOLECYSTECTOMY N/A 09/13/2018   Procedure: LAPAROSCOPIC CHOLECYSTECTOMY;  Surgeon: Jules Husbands, MD;  Location: [facility] ORS;  Service: General;  Laterality: N/A;  . COLONOSCOPY  2008  . ESOPHAGOGASTRODUODENOSCOPY (EGD) WITH PROPOFOL N/A 08/20/2018   Procedure: ESOPHAGOGASTRODUODENOSCOPY (EGD) WITH PROPOFOL;  Surgeon: Lucilla Lame, MD;  Location: [facility] ENDOSCOPY;  Service: Endoscopy;  Laterality: N/A;  . TONSILLECTOMY AND ADENOIDECTOMY  1955    There were no vitals filed for this visit.  Subjective Assessment - 02/26/19 1017    Subjective  Pt reports that he is doing well today. No pain currently. No specific questions or concerns at this time. He reports that he is having bad shoulders.     Pertinent History  Pt reports that he has been sick for the last 6 months. He states that he had his gallbladder out and since then he has been having a lot of fatigue and feels like he is "in a fog." Pt reports that he has tried to stay active but it hasn't been helping. He is complaining of cognitive issues as well with difficulty remembering things that he otherwise was  able to remember prior to the surgery. He has been dragging his feet and "walking like an old man." He does complain of R sided tremor primarily. States that neurologist does not believe that he has Parkinson's. He denies feeling dizzy but feels more "air-headed." Denies any true vertigo. Pt states that he takes [facility] for his enlarged prostate however he still wakes up 4-5 times/night to urinate. He denies any snoring while sleeping. He reports decreased libido and erectile dysfunction since the surgery. Pt reports that his PCP has ordered "all kinds of lab tests" to try to determine the source of his fatigue. He stopped taking BP meds approximately 1 month ago with  the consent of his MD. BP has been stable since that time. He started taking Vitamin B12 due to low B12 levels and they are now improved. He reports approximately 6 falls in the last 6 months. Denies numbness,tingling, or focal weakness.    Limitations  Walking    Diagnostic tests  MRI (see history)    Patient Stated Goals  Improve his balance and energy       NEUROMUSCULAR RE-EDUCATION   Airex NBOS head turns x 20  each;cues for posture correction   Airex feet apart and tapping yellow disk x 20 cues for posture correction   Airex cone  reaching crossing midline cues for safety, stacking cones on stool x 20 x 3 sets   Toe tapping 6 inch stool without UE assist x 20 , cues for technique   Side stepping on blue  foam balance beam x 5 lengths of the parallel bars with posture correction cues   Tandem standing on foam and cone ball UE and trunk rotation with need of min assist x 2 sets   Matrix 22. 5 lbs fwd/bwd x 5 with min assist and cues to slow down movement.   TM side stepping with elevation 1 and 1.4  miles / hour  UE support   Therapeutic exercise:  Planks prone 12 sec x 3 sets  side lying plank attempted but unable to perform due to shoulder pain   Bridges single leg , with SLR x 5 x 2 sets   Quadriped x leg extension with 10 sec hold x 5   Patient has coordination deficits and motor planning deficits and is impulsive and too fast.   Cues for proper technique of exercises, slow eccentric contractions to target specific muscles                         PT Education - 02/26/19 1019    Education Details  HEP    Person(s) Educated  Patient    Methods  Explanation    Comprehension  Verbalized understanding;Returned demonstration;Need further instruction       PT Short Term Goals - 02/14/19 0934      PT SHORT TERM GOAL #1   Title  Pt will be independent with HEP in order to improve strength and balance in order to decrease fall risk and improve  function at home and work.     Time  6    Period  Weeks    Status  New    Target Date  03/28/19        PT Long Term Goals - 02/14/19 0935      PT LONG TERM GOAL #1   Title  Pt will improve BERG by at least 3 points in order to demonstrate clinically significant improvement in  balance.      Baseline  02/14/19: 52/56    Time  12    Period  Weeks    Status  New    Target Date  05/09/19      PT LONG TERM GOAL #2   Title  Pt will improve FGA by at least 4 points in order to demonstrate clinically significant improvement in balance and decreased risk for falls.    Baseline  02/14/19: 22/30    Time  12    Period  Weeks    Status  New    Target Date  05/09/19      PT LONG TERM GOAL #3   Title  Pt will improve ABC by at least 13% in order to demonstrate clinically significant improvement in balance confidence.     Baseline  02/14/19: 58.125%    Time  12    Period  Weeks    Status  New    Target Date  05/09/19      PT LONG TERM GOAL #4   Title  Pt will be able to balance with eyes closed on foam for at least 15 seconds in order to demonstrate improved balance in low light conditions to decrease risk for falls    Baseline  02/14/19: 2.8 seconds    Time  12    Period  Weeks    Status  New    Target Date  05/09/19            Plan - 02/26/19 1022    Clinical Impression Statement  Patient required min verbal cueing during side stepping, and required CGA during all dynamic standing balance activities. Patient required occasional rest breaks between exercises due to fatigue. Patient tolerated exercise well. Patient will continue to benefit from skilled therapy in order to improve dynamic standing balance activities and increase gait speed to reduce risk for falls    Rehab Potential  Good    PT Frequency  2x / week    PT Duration  12 weeks    PT Treatment/Interventions  Cryotherapy;Electrical Stimulation;Ultrasound;Moist Heat;Iontophoresis 4mg /ml Dexamethasone;Therapeutic  activities;Therapeutic exercise;Patient/family education;Neuromuscular re-education;Manual techniques;ADLs/Self Care Home Management;Aquatic Therapy;Canalith Repostioning;Traction;DME Instruction;Gait training;Stair training;Functional mobility training;Balance training;Passive range of motion;Dry needling;Energy conservation;Vestibular    PT Next Visit Plan  Initiate HEP, progress strength/balance    PT Home Exercise Plan  To be initiated at next visit    Consulted and Agree with Plan of Care  Patient       Patient will benefit from skilled therapeutic intervention in order to improve the following deficits and impairments:  Impaired perceived functional ability, Decreased balance  Visit Diagnosis: Cognitive communication deficit  Unsteadiness on feet  Muscle weakness (generalized)     Problem List Patient Active Problem List   Diagnosis Date Noted  . Carotid atherosclerosis, bilateral 02/20/2019  . Abnormal Holter exam 12/14/2018  . Vitamin B12 deficiency 10/15/2018  . Memory impairment 10/12/2018  . Hx of major depression 10/12/2018  . Biliary dyskinesia   . Protein-calorie malnutrition, severe 08/20/2018  . Gastritis without bleeding   . Duodenitis   . Nausea and vomiting 08/18/2018  . Left bundle branch block 06/25/2018  . Second degree AV block, Mobitz type I 06/25/2018  . Adrenal adenoma 05/09/2017  . Pulmonary nodules 04/19/2017  . Medication monitoring encounter 04/19/2017  . BPH (benign prostatic hyperplasia) 04/19/2017  . Pain in wrist 01/20/2017  . 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 CAD (coronary artery disease) 03/25/2016  . Abnormal EKG 12/16/2015  .  Hyperlipidemia 07/07/2015  . Essential hypertension 07/07/2015  . Hyperglycemia 07/07/2015    Alanson Puls, PT DPT 02/26/2019, 10:53 AM  [place] MAIN [facility] SERVICES [address] [place], [place], [address] Phone: [phone]   Fax:   [phone]  Name: Stephen Stein MRN: [id number] Date of Birth: Jul 16, 1941

## 2019-02-26 NOTE — Therapy (Signed)
[place] MAIN [facility] SERVICES [address] [place], [place], [address] Phone: [phone]   Fax:  [phone]  Speech Language Pathology Treatment  Patient Details  Name: Stephen Stein MRN: [id number] Date of Birth: [birth year] Referring Provider (SLP): Dr. Manuella Ghazi   Encounter Date: 02/26/2019  End of Session - 02/26/19 1133    Visit Number  8    Number of Visits  13    Date for SLP Re-Evaluation  03/11/19    SLP Start Time  0900    SLP Stop Time   0953    SLP Time Calculation (min)  53 min    Activity Tolerance  Patient tolerated treatment well       Past Medical History:  Diagnosis Date  . 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 Biliary dyskinesia    a.08/2018 s/p lap chole.  [name] BPH (benign prostatic hyperplasia)   . Carotid atherosclerosis, bilateral 02/20/2019   Feb 2020  . Coronary artery calcification seen on CT scan    a. 11/2015 Cardiac CT: Cor Ca2+ = 892 (80th %'ile). Diffuse Ca2+ in all 3 major coronary arteries; b. 02/2016 MV: HTN response to exercise. Abnl ecg w/ horizontal inflat ST depression, but no ischemia/infarct on imaging; c. 09/2018 MV: EF 64%, no ischemia. Very sm region of mild distal antsept defect, likely 2/2 lbbb.  . Hyperlipidemia   . Hypertension    For irregular heart beat  . Irregular heartbeat/Palpitations    a. Has been taking Atenolol for 20 years  . LBBB (left bundle branch block)   . Left ventricular hypokinesis    a. 09/2018 Echo: EF 45-50%, mod LVH, diff HK. Gr1 DD. Triv AI. Mildly dil Ao root. Mildly reduced RV fxn.  . Lightheadedness   . NSVT (nonsustained ventricular tachycardia) ([place])    a. 11/2018 Zio monitor - 13 beats of NSVT--asymptomatic.  . Orthostatic hypotension    a. 11/2018 pressures improved w/ midodrine/florinef but constant lightheadedness persists.  . Sinus bradycardia    a. 11/2018 Zio monitor: Avg HR 57 (min 28). Intermittent Mobitz I.    Past  Surgical History:  Procedure Laterality Date  . CHOLECYSTECTOMY N/A 09/13/2018   Procedure: LAPAROSCOPIC CHOLECYSTECTOMY;  Surgeon: Jules Husbands, MD;  Location: [facility] ORS;  Service: General;  Laterality: N/A;  . COLONOSCOPY  2008  . ESOPHAGOGASTRODUODENOSCOPY (EGD) WITH PROPOFOL N/A 08/20/2018   Procedure: ESOPHAGOGASTRODUODENOSCOPY (EGD) WITH PROPOFOL;  Surgeon: Lucilla Lame, MD;  Location: [facility] ENDOSCOPY;  Service: Endoscopy;  Laterality: N/A;  . TONSILLECTOMY AND ADENOIDECTOMY  1955    There were no vitals filed for this visit.  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 This patient was referred to [facility] by Neurologist for evaluation, due to reported sudden memory issues that began around July/August of 2019. Per admitting H&P, patient's wife reported patient has been having trouble remembering the day of the week, the year, and people's names. This patient does not have history of significant head trauma or family history of Dementia. Per admitting H&P, patient drinks  significant amounts of alcohol, though not currently. Pt is going through a major life event. Patient's memory deficits do not affect his daily functioning. Patient reported frequent feelings of boredom, changes in personality, dizziness or "air headedness" (per patient). This patient also reported heightened sense of smell and taste, at onset of memory issues back in July/August 2019. Patient has had MRI brain done. Patient's wife reported that patient was "not always this talkative," he was previously a "man of few words." Patient reported  similarly, reporting he feels his personality has changed and he used to be more "in control," prior to the memory changes in July/August 2019.        Treatment Provided   Treatment provided  Cognitive-Linquistic      Pain Assessment   Pain Assessment  No/denies pain      Cognitive-Linquistic Treatment   Treatment focused on  Cognition    Skilled Treatment  IDENTIFY COGNITIVE BARRIERS:  The patient identified "it hurts to think", meaning that deep concentration/reasoning is difficult and frustrating.  The patient agrees that his impulsive/rapid response to stimuli is counterproductive.  SUSTAINED CONCENTRATION/REASONING:  The patient returns with completed cross word puzzle given last time.  The patient had minimal difficulty with visual puzzles, with the exception of rebus puzzles.  The patient was able to follow step by step instructions with min-mod cues to attend to task.  ASSIGNMENT: Patient has simple cross word puzzles and word search to complete.  Given note to work slowly and be sure his writing is legible.      Assessment / Recommendations / Plan   Plan  Continue with current plan of care      Progression Toward Goals   Progression toward goals  Progressing toward goals       SLP Education - 02/26/19 1132    Education Details  Slow down- curb impulsivity    Person(s) Educated  Patient    Methods  Explanation    Comprehension  Verbalized understanding         SLP Long Term Goals - 01/29/19 1635      SLP LONG TERM GOAL #1   Title  Patient will demonstrate functional cognitive-communication skills for independent completion of personal responsibilities and leisure activities.    Time  6    Period  Weeks    Status  New    Target Date  03/11/19      SLP LONG TERM GOAL #2   Title  Patient will identify cognitive-communication barriers and participate in developing functional compensatory strategies.    Time  6    Period  Weeks    Status  New    Target Date  03/11/19       SLP LONG TERM GOAL #3   Title  Patient will complete complex executive function skills tasks with 80% accuracy.    Time  6    Period  Weeks    Status  New    Target Date  03/11/19      SLP LONG TERM GOAL #4   Title  Patient will demonstrate oral/written comprehension for abstract/complex verbal/written information via grammatical, fluent, and cogent sentences to complete abstract/complex linguistic tasks with 80% accuracy.    Time  6    Period  Weeks    Status  New    Target Date  03/11/19       Plan - 02/26/19 1134    Speech Therapy Frequency  2x / week    Duration  Other (  comment)    Treatment/Interventions  Cognitive reorganization;Patient/family education    Potential to Achieve Goals  Good    Potential Considerations  Ability to learn/carryover information;Family/community support;Previous level of function;Cooperation/participation level    SLP Home Exercise Plan  Provided    Consulted and Agree with Plan of Care  Patient       Patient will benefit from skilled therapeutic intervention in order to improve the following deficits and impairments:   Cognitive communication deficit    Problem List Patient Active Problem List   Diagnosis Date Noted  . Carotid atherosclerosis, bilateral 02/20/2019  . Abnormal Holter exam 12/14/2018  . Vitamin B12 deficiency 10/15/2018  . Memory impairment 10/12/2018  . Hx of major depression 10/12/2018  . Biliary dyskinesia   . Protein-calorie malnutrition, severe 08/20/2018  . Gastritis without bleeding   . Duodenitis   . Nausea and vomiting 08/18/2018  . Left bundle branch block 06/25/2018  . Second degree AV block, Mobitz type I 06/25/2018  . Adrenal adenoma 05/09/2017  . Pulmonary nodules 04/19/2017  . Medication monitoring encounter 04/19/2017  . BPH (benign prostatic hyperplasia) 04/19/2017  . Pain in wrist 01/20/2017  . 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 CAD (coronary artery disease) 03/25/2016  .  Abnormal EKG 12/16/2015  . Hyperlipidemia 07/07/2015  . Essential hypertension 07/07/2015  . Hyperglycemia 07/07/2015   Leroy Sea, MS/CCC- SLP  Lou Miner 02/26/2019, 11:36 AM  [place] MAIN [facility] SERVICES [address] [place], [place], [address] Phone: [phone]   Fax:  [phone]   Name: Stephen Stein MRN: [id number] Date of Birth: 14-Dec-1941

## 2019-02-28 ENCOUNTER — Ambulatory Visit: Payer: PPO | Admitting: Speech Pathology

## 2019-02-28 ENCOUNTER — Ambulatory Visit: Payer: PPO

## 2019-02-28 DIAGNOSIS — R41841 Cognitive communication deficit: Secondary | ICD-10-CM | POA: Diagnosis not present

## 2019-02-28 DIAGNOSIS — M25571 Pain in right ankle and joints of right foot: Secondary | ICD-10-CM

## 2019-02-28 DIAGNOSIS — M6281 Muscle weakness (generalized): Secondary | ICD-10-CM

## 2019-02-28 DIAGNOSIS — R2681 Unsteadiness on feet: Secondary | ICD-10-CM

## 2019-02-28 NOTE — Therapy (Signed)
[place] MAIN [facility] SERVICES [address], [place], [address] Phone: [phone]   Fax:  [phone]  Physical Therapy Treatment  Patient Details  Name: Stephen Stein MRN: [id number] Date of Birth: 09-25-1941 Referring Provider (PT): Dr. Sanda Klein   Encounter Date: 02/28/2019  PT End of Session - 02/28/19 0812    Visit Number  4    Number of Visits  25    Date for PT Re-Evaluation  05/09/19    Authorization Type  eval 02/14/19    PT Start Time  0804    PT Stop Time  0844    PT Time Calculation (min)  40 min    Activity Tolerance  Patient tolerated treatment well    Behavior During Therapy  [facility] for tasks assessed/performed       Past Medical History:  Diagnosis Date  . 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 Biliary dyskinesia    a.08/2018 s/p lap chole.  [name] BPH (benign prostatic hyperplasia)   . Carotid atherosclerosis, bilateral 02/20/2019   Feb 2020  . Coronary artery calcification seen on CT scan    a. 11/2015 Cardiac CT: Cor Ca2+ = 892 (80th %'ile). Diffuse Ca2+ in all 3 major coronary arteries; b. 02/2016 MV: HTN response to exercise. Abnl ecg w/ horizontal inflat ST depression, but no ischemia/infarct on imaging; c. 09/2018 MV: EF 64%, no ischemia. Very sm region of mild distal antsept defect, likely 2/2 lbbb.  . Hyperlipidemia   . Hypertension    For irregular heart beat  . Irregular heartbeat/Palpitations    a. Has been taking Atenolol for 20 years  . LBBB (left bundle branch block)   . Left ventricular hypokinesis    a. 09/2018 Echo: EF 45-50%, mod LVH, diff HK. Gr1 DD. Triv AI. Mildly dil Ao root. Mildly reduced RV fxn.  . Lightheadedness   . NSVT (nonsustained ventricular tachycardia) ([place])    a. 11/2018 Zio monitor - 13 beats of NSVT--asymptomatic.  . Orthostatic hypotension    a. 11/2018 pressures improved w/ midodrine/florinef but constant lightheadedness persists.  . Sinus bradycardia     a. 11/2018 Zio monitor: Avg HR 57 (min 28). Intermittent Mobitz I.    Past Surgical History:  Procedure Laterality Date  . CHOLECYSTECTOMY N/A 09/13/2018   Procedure: LAPAROSCOPIC CHOLECYSTECTOMY;  Surgeon: Jules Husbands, MD;  Location: [facility] ORS;  Service: General;  Laterality: N/A;  . COLONOSCOPY  2008  . ESOPHAGOGASTRODUODENOSCOPY (EGD) WITH PROPOFOL N/A 08/20/2018   Procedure: ESOPHAGOGASTRODUODENOSCOPY (EGD) WITH PROPOFOL;  Surgeon: Lucilla Lame, MD;  Location: [facility] ENDOSCOPY;  Service: Endoscopy;  Laterality: N/A;  . TONSILLECTOMY AND ADENOIDECTOMY  1955    There were no vitals filed for this visit.  Subjective Assessment - 02/28/19 0808    Subjective  Pt says he is good today. He said his abdominals are sore from his last sesion. HEP is going well. He says no improvement with balance at home, as he still needs to wait momentarily before preceding with walking.      Pertinent History  Pt reports that he has been sick for the last 6 months. He states that he had his gallbladder out and since then he has been having a lot of fatigue and feels like he is "in a fog." Pt reports that he has tried to stay active but it hasn't been helping. He is complaining of cognitive issues as well with difficulty remembering things that  he otherwise was able to remember prior to the surgery. He has been dragging his feet and "walking like an old man." He does complain of R sided tremor primarily. States that neurologist does not believe that he has Parkinson's. He denies feeling dizzy but feels more "air-headed." Denies any true vertigo. Pt states that he takes [facility] for his enlarged prostate however he still wakes up 4-5 times/night to urinate. He denies any snoring while sleeping. He reports decreased libido and erectile dysfunction since the surgery. Pt reports that his PCP has ordered "all kinds of lab tests" to try to determine the source of his fatigue. He stopped taking BP meds approximately 1  month ago with the consent of his MD. BP has been stable since that time. He started taking Vitamin B12 due to low B12 levels and they are now improved. He reports approximately 6 falls in the last 6 months. Denies numbness,tingling, or focal weakness.    Currently in Pain?  No/denies       NEUROMUSCULAR RE-EDUCATION -Side stepping on blue foam balance beam x 5 lengths of the parallel bars with posture correction cues (BUE assist as needed only with LOB)  -SLS 10x3secH in //bars -STS from chair+airex, feet also on airex, no UE use, but 3 LOB requiring // bars for self support: 2 sets of 10 -Airex semi-narrow stance head turns 30x alternating sides; cues for posture correction -Airex vertical headturns 15x on airex semi narrow stance  -Lateral 6" step-ups (1x12 each side)   -Lateral Step ups (2 airex pads) 1x8 bilat (VC to watch for edges)  -ladder drills: 4 times (twice each direction) VC for directional changes, minA for balance, VC for pattern reminders   Patient has coordination deficits and motor planning deficits and is impulsive and too fast.    PT Short Term Goals - 02/14/19 0934      PT SHORT TERM GOAL #1   Title  Pt will be independent with HEP in order to improve strength and balance in order to decrease fall risk and improve function at home and work.     Time  6    Period  Weeks    Status  New    Target Date  03/28/19        PT Long Term Goals - 02/14/19 0935      PT LONG TERM GOAL #1   Title  Pt will improve BERG by at least 3 points in order to demonstrate clinically significant improvement in balance.      Baseline  02/14/19: 52/56    Time  12    Period  Weeks    Status  New    Target Date  05/09/19      PT LONG TERM GOAL #2   Title  Pt will improve FGA by at least 4 points in order to demonstrate clinically significant improvement in balance and decreased risk for falls.    Baseline  02/14/19: 22/30    Time  12    Period  Weeks    Status  New    Target  Date  05/09/19      PT LONG TERM GOAL #3   Title  Pt will improve ABC by at least 13% in order to demonstrate clinically significant improvement in balance confidence.     Baseline  02/14/19: 58.125%    Time  12    Period  Weeks    Status  New    Target Date  05/09/19  PT LONG TERM GOAL #4   Title  Pt will be able to balance with eyes closed on foam for at least 15 seconds in order to demonstrate improved balance in low light conditions to decrease risk for falls    Baseline  02/14/19: 2.8 seconds    Time  12    Period  Weeks    Status  New    Target Date  05/09/19            Plan - 02/28/19 0813    Clinical Impression Statement  Continued to performance of dynamic and static balance activity. Pt able to progress time or reps during some balance activity, a sign of improvement in some areas. Pt continues to struggle withtrunk control during single leg balance actiivty. Overall pt continues to make slow but steady progress toward goals.     Rehab Potential  Good    Clinical Impairments Affecting Rehab Potential  chronic condition, arthritis    PT Frequency  2x / week    PT Duration  12 weeks    PT Treatment/Interventions  Cryotherapy;Electrical Stimulation;Ultrasound;Moist Heat;Iontophoresis 4mg /ml Dexamethasone;Therapeutic activities;Therapeutic exercise;Patient/family education;Neuromuscular re-education;Manual techniques;ADLs/Self Care Home Management;Aquatic Therapy;Canalith Repostioning;Traction;DME Instruction;Gait training;Stair training;Functional mobility training;Balance training;Passive range of motion;Dry needling;Energy conservation;Vestibular    PT Next Visit Plan  Initiate HEP, progress strength/balance    PT Home Exercise Plan  To be initiated at next visit    Consulted and Agree with Plan of Care  Patient       Patient will benefit from skilled therapeutic intervention in order to improve the following deficits and impairments:     Visit  Diagnosis: Unsteadiness on feet  Muscle weakness (generalized)  Pain in right ankle and joints of right foot     Problem List Patient Active Problem List   Diagnosis Date Noted  . Carotid atherosclerosis, bilateral 02/20/2019  . Abnormal Holter exam 12/14/2018  . Vitamin B12 deficiency 10/15/2018  . Memory impairment 10/12/2018  . Hx of major depression 10/12/2018  . Biliary dyskinesia   . Protein-calorie malnutrition, severe 08/20/2018  . Gastritis without bleeding   . Duodenitis   . Nausea and vomiting 08/18/2018  . Left bundle branch block 06/25/2018  . Second degree AV block, Mobitz type I 06/25/2018  . Adrenal adenoma 05/09/2017  . Pulmonary nodules 04/19/2017  . Medication monitoring encounter 04/19/2017  . BPH (benign prostatic hyperplasia) 04/19/2017  . Pain in wrist 01/20/2017  . 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 CAD (coronary artery disease) 03/25/2016  . Abnormal EKG 12/16/2015  . Hyperlipidemia 07/07/2015  . Essential hypertension 07/07/2015  . Hyperglycemia 07/07/2015   8:23 AM, 02/28/19 Etta Grandchild, PT, DPT Physical Therapist - [facility]  Outpatient Physical Therapy- [place] [phone]     Etta Grandchild 02/28/2019, 8:23 AM  [place] MAIN [facility] SERVICES [address] [place], [place], [address] Phone: [phone]   Fax:  [phone]  Name: Stephen Stein MRN: [id number] Date of Birth: 1941-05-02

## 2019-03-01 ENCOUNTER — Encounter: Payer: Self-pay | Admitting: Speech Pathology

## 2019-03-01 NOTE — Therapy (Signed)
[place] MAIN [facility] SERVICES [address] [place], [place], [address] Phone: [phone]   Fax:  [phone]  Speech Language Pathology Treatment  Patient Details  Name: Stephen Stein MRN: [id number] Date of Birth: 10/24/1941 Referring Provider (SLP): Dr. Manuella Ghazi   Encounter Date: 02/28/2019  End of Session - 03/01/19 1544    Visit Number  9    Number of Visits  13    Date for SLP Re-Evaluation  03/11/19    SLP Start Time  0900    SLP Stop Time   0954    SLP Time Calculation (min)  54 min    Activity Tolerance  Patient tolerated treatment well       Past Medical History:  Diagnosis Date  . 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 Biliary dyskinesia    a.08/2018 s/p lap chole.  [name] BPH (benign prostatic hyperplasia)   . Carotid atherosclerosis, bilateral 02/20/2019   Feb 2020  . Coronary artery calcification seen on CT scan    a. 11/2015 Cardiac CT: Cor Ca2+ = 892 (80th %'ile). Diffuse Ca2+ in all 3 major coronary arteries; b. 02/2016 MV: HTN response to exercise. Abnl ecg w/ horizontal inflat ST depression, but no ischemia/infarct on imaging; c. 09/2018 MV: EF 64%, no ischemia. Very sm region of mild distal antsept defect, likely 2/2 lbbb.  . Hyperlipidemia   . Hypertension    For irregular heart beat  . Irregular heartbeat/Palpitations    a. Has been taking Atenolol for 20 years  . LBBB (left bundle branch block)   . Left ventricular hypokinesis    a. 09/2018 Echo: EF 45-50%, mod LVH, diff HK. Gr1 DD. Triv AI. Mildly dil Ao root. Mildly reduced RV fxn.  . Lightheadedness   . NSVT (nonsustained ventricular tachycardia) ([place])    a. 11/2018 Zio monitor - 13 beats of NSVT--asymptomatic.  . Orthostatic hypotension    a. 11/2018 pressures improved w/ midodrine/florinef but constant lightheadedness persists.  . Sinus bradycardia    a. 11/2018 Zio monitor: Avg HR 57 (min 28). Intermittent Mobitz I.    Past  Surgical History:  Procedure Laterality Date  . CHOLECYSTECTOMY N/A 09/13/2018   Procedure: LAPAROSCOPIC CHOLECYSTECTOMY;  Surgeon: Jules Husbands, MD;  Location: [facility] ORS;  Service: General;  Laterality: N/A;  . COLONOSCOPY  2008  . ESOPHAGOGASTRODUODENOSCOPY (EGD) WITH PROPOFOL N/A 08/20/2018   Procedure: ESOPHAGOGASTRODUODENOSCOPY (EGD) WITH PROPOFOL;  Surgeon: Lucilla Lame, MD;  Location: [facility] ENDOSCOPY;  Service: Endoscopy;  Laterality: N/A;  . TONSILLECTOMY AND ADENOIDECTOMY  1955    There were no vitals filed for this visit.  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 This patient was referred to [place] by Neurologist for evaluation, due to reported sudden memory issues that began around July/August of 2019. Per admitting H&P, patient's wife reported patient has been having trouble remembering the day of the week, the year, and people's names. This patient does not have history of significant head trauma or family history of Dementia. Per admitting H&P, patient drinks  significant amounts of alcohol, though not currently. Pt is going through a major life event. Patient's memory deficits do not affect his daily functioning. Patient reported frequent feelings of boredom, changes in personality, dizziness or "air headedness" (per patient). This patient also reported heightened sense of smell and taste, at onset of memory issues back in July/August 2019. Patient has had MRI brain done. Patient's wife reported that patient was "not always this talkative," he was previously a "man of few words." Patient reported  similarly, reporting he feels his personality has changed and he used to be more "in control," prior to the memory changes in July/August 2019.        Treatment Provided   Treatment provided  Cognitive-Linquistic      Pain Assessment   Pain Assessment  No/denies pain      Cognitive-Linquistic Treatment   Treatment focused on  Cognition    Skilled Treatment  IDENTIFY COGNITIVE BARRIERS:  The patient identified "it hurts to think", meaning that deep concentration/reasoning is difficult and frustrating.  The patient agrees that his impulsive/rapid response to stimuli is counterproductive.  SUSTAINED CONCENTRATION/REASONING:  patient requires min-mod cues to complete simple logic and number games.  Patient able to complete step-by-step directions with min cues.  Patient demonstrates little difficulty with a simple figurative language task (understanding idioms) but does have difficulty explaining the abstract meaning of the idiom.   ASSIGNMENT: Patient has simple cross word puzzles and word search to complete.  Given note to work slowly and be sure his writing is legible.      Assessment / Recommendations / Plan   Plan  Continue with current plan of care      Progression Toward Goals   Progression toward goals  Progressing toward goals       SLP Education - 03/01/19 1543    Education Details  Slow down- curb impulsivity    Person(s) Educated  Patient    Methods  Explanation    Comprehension  Verbalized understanding         SLP Long Term Goals - 01/29/19 1635      SLP LONG TERM GOAL #1   Title  Patient will demonstrate functional cognitive-communication skills for independent completion of personal responsibilities and leisure activities.    Time  6    Period  Weeks    Status  New    Target Date  03/11/19      SLP LONG TERM GOAL #2   Title  Patient will identify cognitive-communication barriers and participate in developing functional compensatory strategies.    Time  6     Period  Weeks    Status  New    Target Date  03/11/19      SLP LONG TERM GOAL #3   Title  Patient will complete complex executive function skills tasks with 80% accuracy.    Time  6    Period  Weeks    Status  New    Target Date  03/11/19      SLP LONG TERM GOAL #4   Title  Patient will demonstrate oral/written comprehension for abstract/complex verbal/written information via grammatical, fluent, and cogent sentences to complete abstract/complex linguistic tasks with 80% accuracy.    Time  6    Period  Weeks    Status  New    Target Date  03/11/19       Plan - 03/01/19 1544    Clinical Impression Statement  The patient is receptive to identifying and  defining cognitive barriers and impact on his everyday function.  He is actively participating in therapy.  He demonstrates insight into his deficits and is making effective changes.  He is talking significantly less and reports insight into why he was talking so voluminously.     Speech Therapy Frequency  2x / week    Duration  Other (comment)    Treatment/Interventions  Cognitive reorganization;Patient/family education    Potential to Achieve Goals  Good    Potential Considerations  Ability to learn/carryover information;Family/community support;Previous level of function;Cooperation/participation level    SLP Home Exercise Plan  Provided    Consulted and Agree with Plan of Care  Patient       Patient will benefit from skilled therapeutic intervention in order to improve the following deficits and impairments:   Cognitive communication deficit    Problem List Patient Active Problem List   Diagnosis Date Noted  . Carotid atherosclerosis, bilateral 02/20/2019  . Abnormal Holter exam 12/14/2018  . Vitamin B12 deficiency 10/15/2018  . Memory impairment 10/12/2018  . Hx of major depression 10/12/2018  . Biliary dyskinesia   . Protein-calorie malnutrition, severe 08/20/2018  . Gastritis without bleeding   . Duodenitis   .  Nausea and vomiting 08/18/2018  . Left bundle branch block 06/25/2018  . Second degree AV block, Mobitz type I 06/25/2018  . Adrenal adenoma 05/09/2017  . Pulmonary nodules 04/19/2017  . Medication monitoring encounter 04/19/2017  . BPH (benign prostatic hyperplasia) 04/19/2017  . Pain in wrist 01/20/2017  . 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 CAD (coronary artery disease) 03/25/2016  . Abnormal EKG 12/16/2015  . Hyperlipidemia 07/07/2015  . Essential hypertension 07/07/2015  . Hyperglycemia 07/07/2015   Stephen Sea, MS/CCC- SLP  Lou Miner 03/01/2019, 3:45 PM  [place] MAIN [facility] SERVICES [address] [place], [place], [address] Phone: [phone]   Fax:  [phone]   Name: Stephen Stein MRN: [id number] Date of Birth: May 13, 1941

## 2019-03-05 ENCOUNTER — Encounter: Payer: PPO | Admitting: Speech Pathology

## 2019-03-07 ENCOUNTER — Other Ambulatory Visit: Payer: Self-pay

## 2019-03-07 ENCOUNTER — Ambulatory Visit: Payer: PPO | Admitting: Speech Pathology

## 2019-03-07 ENCOUNTER — Ambulatory Visit: Payer: PPO | Admitting: Internal Medicine

## 2019-03-07 ENCOUNTER — Encounter: Payer: Self-pay | Admitting: Speech Pathology

## 2019-03-07 ENCOUNTER — Encounter

## 2019-03-07 ENCOUNTER — Ambulatory Visit: Payer: PPO

## 2019-03-07 DIAGNOSIS — G8929 Other chronic pain: Secondary | ICD-10-CM

## 2019-03-07 DIAGNOSIS — M25511 Pain in right shoulder: Secondary | ICD-10-CM

## 2019-03-07 DIAGNOSIS — M25512 Pain in left shoulder: Secondary | ICD-10-CM

## 2019-03-07 DIAGNOSIS — M6281 Muscle weakness (generalized): Secondary | ICD-10-CM

## 2019-03-07 DIAGNOSIS — R2681 Unsteadiness on feet: Secondary | ICD-10-CM

## 2019-03-07 DIAGNOSIS — R41841 Cognitive communication deficit: Secondary | ICD-10-CM | POA: Diagnosis not present

## 2019-03-07 DIAGNOSIS — M25571 Pain in right ankle and joints of right foot: Secondary | ICD-10-CM

## 2019-03-07 NOTE — Therapy (Signed)
[place] MAIN [facility] SERVICES [address] [place], [place], [address] Phone: [phone]   Fax:  [phone]  Physical Therapy Treatment  Patient Details  Name: Stephen Stein MRN: [id number] Date of Birth: 07/16/1941 Referring Provider (PT): Dr. Sanda Klein   Encounter Date: 03/07/2019  PT End of Session - 03/07/19 1026    Visit Number  4    Number of Visits  25    Date for PT Re-Evaluation  05/09/19    Authorization Type  eval 02/14/19    PT Start Time  0800    PT Stop Time  0845    PT Time Calculation (min)  45 min    Equipment Utilized During Treatment  Gait belt    Activity Tolerance  Patient tolerated treatment well    Behavior During Therapy  [facility] for tasks assessed/performed       Past Medical History:  Diagnosis Date  . 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 Biliary dyskinesia    a.08/2018 s/p lap chole.  [name] BPH (benign prostatic hyperplasia)   . Carotid atherosclerosis, bilateral 02/20/2019   Feb 2020  . Coronary artery calcification seen on CT scan    a. 11/2015 Cardiac CT: Cor Ca2+ = 892 (80th %'ile). Diffuse Ca2+ in all 3 major coronary arteries; b. 02/2016 MV: HTN response to exercise. Abnl ecg w/ horizontal inflat ST depression, but no ischemia/infarct on imaging; c. 09/2018 MV: EF 64%, no ischemia. Very sm region of mild distal antsept defect, likely 2/2 lbbb.  . Hyperlipidemia   . Hypertension    For irregular heart beat  . Irregular heartbeat/Palpitations    a. Has been taking Atenolol for 20 years  . LBBB (left bundle branch block)   . Left ventricular hypokinesis    a. 09/2018 Echo: EF 45-50%, mod LVH, diff HK. Gr1 DD. Triv AI. Mildly dil Ao root. Mildly reduced RV fxn.  . Lightheadedness   . NSVT (nonsustained ventricular tachycardia) ([place])    a. 11/2018 Zio monitor - 13 beats of NSVT--asymptomatic.  . Orthostatic hypotension    a. 11/2018 pressures improved w/ midodrine/florinef but  constant lightheadedness persists.  . Sinus bradycardia    a. 11/2018 Zio monitor: Avg HR 57 (min 28). Intermittent Mobitz I.    Past Surgical History:  Procedure Laterality Date  . CHOLECYSTECTOMY N/A 09/13/2018   Procedure: LAPAROSCOPIC CHOLECYSTECTOMY;  Surgeon: Jules Husbands, MD;  Location: [facility] ORS;  Service: General;  Laterality: N/A;  . COLONOSCOPY  2008  . ESOPHAGOGASTRODUODENOSCOPY (EGD) WITH PROPOFOL N/A 08/20/2018   Procedure: ESOPHAGOGASTRODUODENOSCOPY (EGD) WITH PROPOFOL;  Surgeon: Lucilla Lame, MD;  Location: [facility] END[facility]COPY;  Service: Endoscopy;  Laterality: N/A;  . TONSILLECTOMY AND ADENOIDECTOMY  1955    There were no vitals filed for this visit.  Subjective Assessment - 03/07/19 1024    Subjective  Patient stated that he is doing well overall. No complaints of pain at start of session.    Pertinent History  Pt reports that he has been sick for the last 6 months. He states that he had his gallbladder out and since then he has been having a lot of fatigue and feels like he is "in a fog." Pt reports that he has tried to stay active but it hasn't been helping. He is complaining of cognitive issues as well with difficulty remembering things that he otherwise was able to remember prior to the surgery. He has been dragging his  feet and "walking like an old man." He does complain of R sided tremor primarily. States that neurologist does not believe that he has Parkinson's. He denies feeling dizzy but feels more "air-headed." Denies any true vertigo. Pt states that he takes [facility] for his enlarged prostate however he still wakes up 4-5 times/night to urinate. He denies any snoring while sleeping. He reports decreased libido and erectile dysfunction since the surgery. Pt reports that his PCP has ordered "all kinds of lab tests" to try to determine the source of his fatigue. He stopped taking BP meds approximately 1 month ago with the consent of his MD. BP has been stable since that  time. He started taking Vitamin B12 due to low B12 levels and they are now improved. He reports approximately 6 falls in the last 6 months. Denies numbness,tingling, or focal weakness.    Limitations  Walking    How long can you walk comfortably?  any distance is uncomfortable    Patient Stated Goals  Improve his balance and energy    Currently in Pain?  No/denies       NEUROMUSCULAR RE-EDUCATION Bicycle for warm up and history (5 minutes unbilled) -Side stepping on blue foam balance beam x 6 lengths of the parallel bars with posture correction cues (BUE assist as needed only with LOB) cues for step length and speed  Tandem on balance 3x30sec ea side -SLS 10 sec hold x 5 ea  //bars -STS from chair+airex, feet also on airex, no UE use, but 1-2 LOB requiring // bars for self support: x10 -Airex narrow stance horizontal head turns 3x10x alternating sides; cues for posture correction -Airex narrow stance vertical head turns 3x10x on airex  -fowrd 6" step-ups x15 with intermittent LOB and use of // bars  - fwd step ups on foam x10 ea intermittent UE support and CGA-minA -lateral step ups on foam 4" steps x10 ea intermittent UE support and CGA    Patient has coordination deficits and motor planning deficits and is impulsive and too fast. The patient needed CGA to minA for higher level balance activities for safety. Patient needs constant cueing for activity pacing, safety, and exercise form/technique. PT and pt spent time on education about potential benefit for occupational therapy for patient as well. Overall the patient continues to benefit from further PT intervention to continue to progress towards goals.      PT Education - 03/07/19 1025    Education Details  exercise technique, balance strategies    Person(s) Educated  Patient    Methods  Explanation;Demonstration;Verbal cues    Comprehension  Verbalized understanding;Returned demonstration;Verbal cues required;Need further instruction        PT Short Term Goals - 02/14/19 0934      PT SHORT TERM GOAL #1   Title  Pt will be independent with HEP in order to improve strength and balance in order to decrease fall risk and improve function at home and work.     Time  6    Period  Weeks    Status  New    Target Date  03/28/19        PT Long Term Goals - 02/14/19 0935      PT LONG TERM GOAL #1   Title  Pt will improve BERG by at least 3 points in order to demonstrate clinically significant improvement in balance.      Baseline  02/14/19: 52/56    Time  12    Period  Weeks    Status  New    Target Date  05/09/19      PT LONG TERM GOAL #2   Title  Pt will improve FGA by at least 4 points in order to demonstrate clinically significant improvement in balance and decreased risk for falls.    Baseline  02/14/19: 22/30    Time  12    Period  Weeks    Status  New    Target Date  05/09/19      PT LONG TERM GOAL #3   Title  Pt will improve ABC by at least 13% in order to demonstrate clinically significant improvement in balance confidence.     Baseline  02/14/19: 58.125%    Time  12    Period  Weeks    Status  New    Target Date  05/09/19      PT LONG TERM GOAL #4   Title  Pt will be able to balance with eyes closed on foam for at least 15 seconds in order to demonstrate improved balance in low light conditions to decrease risk for falls    Baseline  02/14/19: 2.8 seconds    Time  12    Period  Weeks    Status  New    Target Date  05/09/19            Plan - 03/07/19 1026    Clinical Impression Statement  Patient has coordination deficits and motor planning deficits and is impulsive and too fast.    Rehab Potential  Good    Clinical Impairments Affecting Rehab Potential  chronic condition, arthritis    PT Frequency  2x / week    PT Duration  12 weeks    PT Treatment/Interventions  Cryotherapy;Electrical Stimulation;Ultrasound;Moist Heat;Iontophoresis 4mg /ml Dexamethasone;Therapeutic activities;Therapeutic  exercise;Patient/family education;Neuromuscular re-education;Manual techniques;ADLs/Self Care Home Management;Aquatic Therapy;Canalith Repostioning;Traction;DME Instruction;Gait training;Stair training;Functional mobility training;Balance training;Passive range of motion;Dry needling;Energy conservation;Vestibular    PT Next Visit Plan  Initiate HEP, progress strength/balance    PT Home Exercise Plan  To be initiated at next visit    Consulted and Agree with Plan of Care  Patient       Patient will benefit from skilled therapeutic intervention in order to improve the following deficits and impairments:  Impaired perceived functional ability, Decreased balance  Visit Diagnosis: Unsteadiness on feet  Muscle weakness (generalized)  Pain in right ankle and joints of right foot  Chronic right shoulder pain  Chronic left shoulder pain     Problem List Patient Active Problem List   Diagnosis Date Noted  . Carotid atherosclerosis, bilateral 02/20/2019  . Abnormal Holter exam 12/14/2018  . Vitamin B12 deficiency 10/15/2018  . Memory impairment 10/12/2018  . Hx of major depression 10/12/2018  . Biliary dyskinesia   . Protein-calorie malnutrition, severe 08/20/2018  . Gastritis without bleeding   . Duodenitis   . Nausea and vomiting 08/18/2018  . Left bundle branch block 06/25/2018  . Second degree AV block, Mobitz type I 06/25/2018  . Adrenal adenoma 05/09/2017  . Pulmonary nodules 04/19/2017  . Medication monitoring encounter 04/19/2017  . BPH (benign prostatic hyperplasia) 04/19/2017  . Pain in wrist 01/20/2017  . 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 CAD (coronary artery disease) 03/25/2016  . Abnormal EKG 12/16/2015  . Hyperlipidemia 07/07/2015  . Essential hypertension 07/07/2015  . Hyperglycemia 07/07/2015   Lieutenant Diego PT, DPT 10:28 AM,03/07/19 [place] MAIN [facility] SERVICES [address]  [place], [place], [address] Phone: [phone]   Fax:  [phone]  Name: Stephen Stein MRN: [id number] Date of Birth: [birth year]

## 2019-03-07 NOTE — Therapy (Signed)
[place] MAIN [facility] SERVICES [address] [place], [place], [address] Phone: [phone]   Fax:  [phone]  Speech Language Pathology Treatment/Discharge  Patient Details  Name: Stephen Stein MRN: [id number] Date of Birth: 1941-01-08 Referring Provider (SLP): Dr. Manuella Ghazi   Encounter Date: 03/07/2019  End of Session - 03/07/19 1448    Visit Number  10    Number of Visits  13    Date for SLP Re-Evaluation  03/11/19    SLP Start Time  0900    SLP Stop Time   0958    SLP Time Calculation (min)  58 min    Activity Tolerance  Patient tolerated treatment well       Past Medical History:  Diagnosis Date  . 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 Biliary dyskinesia    a.08/2018 s/p lap chole.  [name] BPH (benign prostatic hyperplasia)   . Carotid atherosclerosis, bilateral 02/20/2019   Feb 2020  . Coronary artery calcification seen on CT scan    a. 11/2015 Cardiac CT: Cor Ca2+ = 892 (80th %'ile). Diffuse Ca2+ in all 3 major coronary arteries; b. 02/2016 MV: HTN response to exercise. Abnl ecg w/ horizontal inflat ST depression, but no ischemia/infarct on imaging; c. 09/2018 MV: EF 64%, no ischemia. Very sm region of mild distal antsept defect, likely 2/2 lbbb.  . Hyperlipidemia   . Hypertension    For irregular heart beat  . Irregular heartbeat/Palpitations    a. Has been taking Atenolol for 20 years  . LBBB (left bundle branch block)   . Left ventricular hypokinesis    a. 09/2018 Echo: EF 45-50%, mod LVH, diff HK. Gr1 DD. Triv AI. Mildly dil Ao root. Mildly reduced RV fxn.  . Lightheadedness   . NSVT (nonsustained ventricular tachycardia) ([place])    a. 11/2018 Zio monitor - 13 beats of NSVT--asymptomatic.  . Orthostatic hypotension    a. 11/2018 pressures improved w/ midodrine/florinef but constant lightheadedness persists.  . Sinus bradycardia    a. 11/2018 Zio monitor: Avg HR 57 (min 28). Intermittent Mobitz I.     Past Surgical History:  Procedure Laterality Date  . CHOLECYSTECTOMY N/A 09/13/2018   Procedure: LAPAROSCOPIC CHOLECYSTECTOMY;  Surgeon: Jules Husbands, MD;  Location: [facility] ORS;  Service: General;  Laterality: N/A;  . COLONOSCOPY  2008  . ESOPHAGOGASTRODUODENOSCOPY (EGD) WITH PROPOFOL N/A 08/20/2018   Procedure: ESOPHAGOGASTRODUODENOSCOPY (EGD) WITH PROPOFOL;  Surgeon: Lucilla Lame, MD;  Location: [facility] ENDOSCOPY;  Service: Endoscopy;  Laterality: N/A;  . TONSILLECTOMY AND ADENOIDECTOMY  1955    There were no vitals filed for this visit.  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 This patient was referred to [place] by Neurologist for evaluation, due to reported sudden memory issues that began around July/August of 2019. Per admitting H&P, patient's wife reported patient has been having trouble remembering the day of the week, the year, and people's names. This patient does not have history of significant head trauma or  family history of Dementia. Per admitting H&P, patient drinks significant amounts of alcohol, though not currently. Pt is going through a major life event. Patient's memory deficits do not affect his daily functioning. Patient reported frequent feelings of boredom, changes in personality, dizziness or "air headedness" (per patient). This patient also reported heightened sense of smell and taste, at onset of memory issues back in July/August 2019. Patient has had MRI brain done. Patient's wife reported that patient was "not always this  talkative," he was previously a "man of few words." Patient reported similarly, reporting he feels his personality has changed and he used to be more "in control," prior to the memory changes in July/August 2019.        Treatment Provided   Treatment provided  Cognitive-Linquistic      Pain Assessment   Pain Assessment  No/denies pain      Cognitive-Linquistic Treatment   Treatment focused on  Cognition    Skilled Treatment  IDENTIFY COGNITIVE BARRIERS:  The patient identified "it hurts to think", meaning that deep concentration/reasoning is difficult and frustrating.  The patient agrees that his impulsive/rapid response to stimuli is counterproductive.  SUSTAINED CONCENTRATION/REASONING:  patient requires min-mod cues to complete simple logic and number games.  Patient able to complete step-by-step directions with no cues.  Follow complex directions involving numbers with 70% accuracy, difficulty with recognizing when an instruction was more complicated.  Patient demonstrates little difficulty with a simple figurative language task (understanding idioms) but does have difficulty explaining the abstract meaning of the idiom.   ASSIGNMENT: Patient was given information regarding commercially available puzzle and game publication to stimulate cognition.      Assessment / Recommendations / Plan   Plan  Discharge SLP treatment due to (comment)      Progression Toward Goals   Progression toward goals  Goals met, education completed, patient discharged from SLP       SLP Education - 03/07/19 1447    Education Details  Slow down and pay attention    Person(s) Educated  Patient    Methods  Explanation    Comprehension  Verbalized understanding         SLP Long Term Goals - 03/07/19 1450      SLP LONG TERM GOAL #1   Title  Patient will demonstrate functional cognitive-communication skills for independent completion of personal responsibilities and leisure activities.    Status  Achieved       SLP LONG TERM GOAL #2   Title  Patient will identify cognitive-communication barriers and participate in developing functional compensatory strategies.    Status  Achieved      SLP LONG TERM GOAL #3   Title  Patient will complete complex executive function skills tasks with 80% accuracy.    Status  Partially Met      SLP LONG TERM GOAL #4   Title  Patient will demonstrate oral/written comprehension for abstract/complex verbal/written information via grammatical, fluent, and cogent sentences to complete abstract/complex linguistic tasks with 80% accuracy.    Status  Partially Met       Plan - 03/07/19 1448    Clinical Impression Statement  The patient has been receptive to identifying and defining cognitive barriers and impact on his everyday function.  He has actively participated in therapy.  He demonstrates insight into his deficits and is making effective changes.  He is talking significantly less and reports insight into why he was talking so voluminously.  He appears to be accepting  cognitive limitations and is adapting his activities to his abilities.  He has information regarding commercially available simple cognitive and language games and puzzles.  He is ready for discharge charge.    Speech Therapy Frequency  Other (comment)   Discharge   Potential Considerations  Ability to learn/carryover information;Family/community support;Previous level of function;Cooperation/participation level    SLP Home Exercise Plan  Provided    Consulted and Agree with Plan of Care  Patient       Patient will benefit from skilled therapeutic intervention in order to improve the following deficits and impairments:   Cognitive communication deficit    Problem List Patient Active Problem List   Diagnosis Date Noted  . Carotid atherosclerosis, bilateral 02/20/2019  . Abnormal Holter exam 12/14/2018  . Vitamin B12 deficiency 10/15/2018  . Memory impairment 10/12/2018  . Hx of major depression  10/12/2018  . Biliary dyskinesia   . Protein-calorie malnutrition, severe 08/20/2018  . Gastritis without bleeding   . Duodenitis   . Nausea and vomiting 08/18/2018  . Left bundle branch block 06/25/2018  . Second degree AV block, Mobitz type I 06/25/2018  . Adrenal adenoma 05/09/2017  . Pulmonary nodules 04/19/2017  . Medication monitoring encounter 04/19/2017  . BPH (benign prostatic hyperplasia) 04/19/2017  . Pain in wrist 01/20/2017  . 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 CAD (coronary artery disease) 03/25/2016  . Abnormal EKG 12/16/2015  . Hyperlipidemia 07/07/2015  . Essential hypertension 07/07/2015  . Hyperglycemia 07/07/2015   Leroy Sea, MS/CCC- SLP  Lou Miner 03/07/2019, 2:53 PM  [place] MAIN [facility] SERVICES [address] [place], [place], [address] Phone: [phone]   Fax:  [phone]   Name: Stephen Stein MRN: [id number] Date of Birth: 01/23/1941

## 2019-03-12 ENCOUNTER — Ambulatory Visit: Payer: PPO | Admitting: Speech Pathology

## 2019-03-12 ENCOUNTER — Other Ambulatory Visit: Payer: Self-pay

## 2019-03-12 ENCOUNTER — Ambulatory Visit: Payer: PPO

## 2019-03-12 DIAGNOSIS — M25512 Pain in left shoulder: Secondary | ICD-10-CM

## 2019-03-12 DIAGNOSIS — M25571 Pain in right ankle and joints of right foot: Secondary | ICD-10-CM

## 2019-03-12 DIAGNOSIS — M6281 Muscle weakness (generalized): Secondary | ICD-10-CM

## 2019-03-12 DIAGNOSIS — R2681 Unsteadiness on feet: Secondary | ICD-10-CM

## 2019-03-12 DIAGNOSIS — R41841 Cognitive communication deficit: Secondary | ICD-10-CM | POA: Diagnosis not present

## 2019-03-12 DIAGNOSIS — G8929 Other chronic pain: Secondary | ICD-10-CM

## 2019-03-12 NOTE — Therapy (Signed)
[place] MAIN [facility] SERVICES [address] [place], [place], [address] Phone: [phone]   Fax:  [phone]  Physical Therapy Treatment  Patient Details  Name: Stephen Stein MRN: [id number] Date of Birth: 02-07-1941 Referring Provider (PT): Dr. Sanda Klein   Encounter Date: 03/12/2019  PT End of Session - 03/12/19 0810    Visit Number  5    Number of Visits  25    Date for PT Re-Evaluation  05/09/19    Authorization Type  eval 02/14/19    PT Start Time  0807    PT Stop Time  0847    PT Time Calculation (min)  40 min    Equipment Utilized During Treatment  Gait belt    Activity Tolerance  Patient tolerated treatment well    Behavior During Therapy  [facility] for tasks assessed/performed       Past Medical History:  Diagnosis Date  . 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 Biliary dyskinesia    a.08/2018 s/p lap chole.  [name] BPH (benign prostatic hyperplasia)   . Carotid atherosclerosis, bilateral 02/20/2019   Feb 2020  . Coronary artery calcification seen on CT scan    a. 11/2015 Cardiac CT: Cor Ca2+ = 892 (80th %'ile). Diffuse Ca2+ in all 3 major coronary arteries; b. 02/2016 MV: HTN response to exercise. Abnl ecg w/ horizontal inflat ST depression, but no ischemia/infarct on imaging; c. 09/2018 MV: EF 64%, no ischemia. Very sm region of mild distal antsept defect, likely 2/2 lbbb.  . Hyperlipidemia   . Hypertension    For irregular heart beat  . Irregular heartbeat/Palpitations    a. Has been taking Atenolol for 20 years  . LBBB (left bundle branch block)   . Left ventricular hypokinesis    a. 09/2018 Echo: EF 45-50%, mod LVH, diff HK. Gr1 DD. Triv AI. Mildly dil Ao root. Mildly reduced RV fxn.  . Lightheadedness   . NSVT (nonsustained ventricular tachycardia) ([place])    a. 11/2018 Zio monitor - 13 beats of NSVT--asymptomatic.  . Orthostatic hypotension    a. 11/2018 pressures improved w/ midodrine/florinef but  constant lightheadedness persists.  . Sinus bradycardia    a. 11/2018 Zio monitor: Avg HR 57 (min 28). Intermittent Mobitz I.    Past Surgical History:  Procedure Laterality Date  . CHOLECYSTECTOMY N/A 09/13/2018   Procedure: LAPAROSCOPIC CHOLECYSTECTOMY;  Surgeon: Jules Husbands, MD;  Location: [facility] ORS;  Service: General;  Laterality: N/A;  . COLONOSCOPY  2008  . ESOPHAGOGASTRODUODENOSCOPY (EGD) WITH PROPOFOL N/A 08/20/2018   Procedure: ESOPHAGOGASTRODUODENOSCOPY (EGD) WITH PROPOFOL;  Surgeon: Lucilla Lame, MD;  Location: [facility] ENDOSCOPY;  Service: Endoscopy;  Laterality: N/A;  . TONSILLECTOMY AND ADENOIDECTOMY  1955    There were no vitals filed for this visit.  Subjective Assessment - 03/12/19 0809    Subjective  Pt doing ok today. His left shoulder is a little sore for no particular reason, but otherwise he is doing well.     Pertinent History  Pt reports that he has been sick for the last 6 months. He states that he had his gallbladder out and since then he has been having a lot of fatigue and feels like he is "in a fog." Pt reports that he has tried to stay active but it hasn't been helping. He is complaining of cognitive issues as well with difficulty remembering things that he otherwise was able to remember prior to the  surgery. He has been dragging his feet and "walking like an old man." He does complain of R sided tremor primarily. States that neurologist does not believe that he has Parkinson's. He denies feeling dizzy but feels more "air-headed." Denies any true vertigo. Pt states that he takes [facility] for his enlarged prostate however he still wakes up 4-5 times/night to urinate. He denies any snoring while sleeping. He reports decreased libido and erectile dysfunction since the surgery. Pt reports that his PCP has ordered "all kinds of lab tests" to try to determine the source of his fatigue. He stopped taking BP meds approximately 1 month ago with the consent of his MD. BP has  been stable since that time. He started taking Vitamin B12 due to low B12 levels and they are now improved. He reports approximately 6 falls in the last 6 months. Denies numbness,tingling, or focal weakness.    Currently in Pain?  No/denies        INTERVENTION THIS DATE  NEUROMUSCULAR RE-EDUCATION -STS from chair+airex, feet also on airex, no UE use, but 3 LOB requiring // bars for self support: 1 sets of 10 -Fwd Step Up 1x10 bilat c 7.5lb cable resistance bilat (4 sets total) -Lateral step ups with cable resistance pulling anteriorly 1x10 bilat (7.5lbs) -Lateral step ups with lateral cable resist 1x10 bilat 7.5lb resistance -Narrow Stance airex ball rotation 10x bilat -Four square Stepping: 4x CW, 4xCCW (supervision) VC to avoid border stepping.      PT Short Term Goals - 03/12/19 0813      PT SHORT TERM GOAL #1   Title  Pt will be independent with HEP in order to improve strength and balance in order to decrease fall risk and improve function at home and work.     Time  6    Period  Weeks    Status  New    Target Date  03/28/19        PT Long Term Goals - 03/12/19 0814      PT LONG TERM GOAL #1   Title  Pt will improve BERG by at least 3 points in order to demonstrate clinically significant improvement in balance.      Baseline  02/14/19: 52/56    Time  12    Period  Weeks    Status  New    Target Date  05/09/19      PT LONG TERM GOAL #2   Title  Pt will improve FGA by at least 4 points in order to demonstrate clinically significant improvement in balance and decreased risk for falls.    Baseline  02/14/19: 22/30    Time  12    Period  Weeks    Status  New    Target Date  05/09/19      PT LONG TERM GOAL #3   Title  Pt will improve ABC by at least 13% in order to demonstrate clinically significant improvement in balance confidence.     Baseline  02/14/19: 58.125%    Time  12    Period  Weeks    Status  New    Target Date  05/09/19      PT LONG TERM GOAL #4    Title  Pt will be able to balance with eyes closed on foam for at least 15 seconds in order to demonstrate improved balance in low light conditions to decrease risk for falls    Baseline  02/14/19: 2.8 seconds  Time  12    Period  Weeks    Status  New    Target Date  05/09/19            Plan - 03/12/19 8916    Clinical Impression Statement  Continued to progress stepping and balance activit.y Pt remains slightly impulsive at times, but with simple VC demonstrates better ability at waiting for full instructions and not rushign through balance activity. Pt able to complete entire sesion without any frank fatigue limitations or pain. Pt is making gradual but clear progress toward goals.     Rehab Potential  Good    Clinical Impairments Affecting Rehab Potential  chronic condition, arthritis    PT Frequency  2x / week    PT Duration  12 weeks    PT Treatment/Interventions  Cryotherapy;Electrical Stimulation;Ultrasound;Moist Heat;Iontophoresis 4mg /ml Dexamethasone;Therapeutic activities;Therapeutic exercise;Patient/family education;Neuromuscular re-education;Manual techniques;ADLs/Self Care Home Management;Aquatic Therapy;Canalith Repostioning;Traction;DME Instruction;Gait training;Stair training;Functional mobility training;Balance training;Passive range of motion;Dry needling;Energy conservation;Vestibular    PT Next Visit Plan  Initiate HEP, progress strength/balance    PT Home Exercise Plan  To be initiated at next visit    Consulted and Agree with Plan of Care  Patient       Patient will benefit from skilled therapeutic intervention in order to improve the following deficits and impairments:  Impaired perceived functional ability, Decreased balance  Visit Diagnosis: Unsteadiness on feet  Muscle weakness (generalized)  Pain in right ankle and joints of right foot  Chronic left shoulder pain     Problem List Patient Active Problem List   Diagnosis Date Noted  . Carotid  atherosclerosis, bilateral 02/20/2019  . Abnormal Holter exam 12/14/2018  . Vitamin B12 deficiency 10/15/2018  . Memory impairment 10/12/2018  . Hx of major depression 10/12/2018  . Biliary dyskinesia   . Protein-calorie malnutrition, severe 08/20/2018  . Gastritis without bleeding   . Duodenitis   . Nausea and vomiting 08/18/2018  . Left bundle branch block 06/25/2018  . Second degree AV block, Mobitz type I 06/25/2018  . Adrenal adenoma 05/09/2017  . Pulmonary nodules 04/19/2017  . Medication monitoring encounter 04/19/2017  . BPH (benign prostatic hyperplasia) 04/19/2017  . Pain in wrist 01/20/2017  . 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 CAD (coronary artery disease) 03/25/2016  . Abnormal EKG 12/16/2015  . Hyperlipidemia 07/07/2015  . Essential hypertension 07/07/2015  . Hyperglycemia 07/07/2015   8:53 AM, 03/12/19 Etta Grandchild, PT, DPT Physical Therapist - [place] [phone]     Etta Grandchild 03/12/2019, 8:52 AM  [place] MAIN [facility] SERVICES [address] [place], [place], [address] Phone: [phone]   Fax:  [phone]  Name: BIRNEY BELSHE MRN: [id number] Date of Birth: 05-Dec-1941

## 2019-03-14 ENCOUNTER — Encounter: Payer: PPO | Admitting: Speech Pathology

## 2019-03-15 ENCOUNTER — Ambulatory Visit: Payer: PPO | Admitting: Family Medicine

## 2019-03-15 ENCOUNTER — Ambulatory Visit: Payer: PPO

## 2019-03-19 ENCOUNTER — Encounter: Payer: PPO | Admitting: Speech Pathology

## 2019-03-19 ENCOUNTER — Ambulatory Visit: Payer: PPO

## 2019-03-20 NOTE — Therapy (Signed)
[place] MAIN [facility] SERVICES [address] [place], [place], [address] Phone: [phone]   Fax:  [phone]  Patient Details  Name: Stephen Stein MRN: [id number] Date of Birth: 05/16/1941 Referring Provider:  No ref. provider found  Encounter Date: 03/20/2019  Attempted phone call to patient twice this date to number listed in [facility]. There was no answer, and no opportunity to leave a voicemail. I took time to subsequently send an email to the patient explaining clinic closure and asking if patient needed any direction or updates regarding his home exercise program.   9:25 AM, 03/20/19 Etta Grandchild, PT, DPT Physical Therapist - [place] [phone]     Etta Grandchild 03/20/2019, 9:25 AM  [place] [address] [place], [place], [address] Phone: [phone]   Fax:  [phone]

## 2019-03-21 ENCOUNTER — Encounter: Payer: PPO | Admitting: Speech Pathology

## 2019-03-21 ENCOUNTER — Ambulatory Visit: Payer: PPO

## 2019-03-26 ENCOUNTER — Ambulatory Visit: Payer: PPO | Admitting: Physical Therapy

## 2019-03-26 ENCOUNTER — Encounter: Payer: PPO | Admitting: Speech Pathology

## 2019-03-26 ENCOUNTER — Telehealth: Payer: Self-pay

## 2019-03-26 NOTE — Telephone Encounter (Signed)
lmov for patient to call back.  Need to change to an EVISIT

## 2019-03-28 ENCOUNTER — Encounter: Payer: PPO | Admitting: Speech Pathology

## 2019-03-28 ENCOUNTER — Ambulatory Visit: Payer: PPO

## 2019-04-01 NOTE — Telephone Encounter (Signed)
Virtual Visit Pre-Appointment Phone Call  Steps For Call:  1. Confirm consent - "In the setting of the current Covid19 crisis, you are scheduled for a VIDEO visit with your provider on 04/22/2019 at 8:00AM.  Just as we do with many in-office visits, in order for you to participate in this visit, we must obtain consent.  If you'd like, I can send this to your mychart (if signed up) or email for you to review.  Otherwise, I can obtain your verbal consent now.  All virtual visits are billed to your insurance company just like a normal visit would be.  By agreeing to a virtual visit, we'd like you to understand that the technology does not allow for your provider to perform an examination, and thus may limit your provider's ability to fully assess your condition.  Finally, though the technology is pretty good, we cannot assure that it will always work on either your or our end, and in the setting of a video visit, we may have to convert it to a phone-only visit.  In either situation, we cannot ensure that we have a secure connection.  Are you willing to proceed?"  2. Give patient instructions for WebEx download to smartphone as below if video visit  3. Advise patient to be prepared with any vital sign or heart rhythm information, their current medicines, and a piece of paper and pen handy for any instructions they may receive the day of their visit  4. Inform patient they will receive a phone call 15 minutes prior to their appointment time (may be from unknown caller ID) so they should be prepared to answer  5. Confirm that appointment type is correct in Epic appointment notes (video vs telephone)    TELEPHONE CALL NOTE  Stephen Stein has been deemed a candidate for a follow-up tele-health visit to limit community exposure during the Covid-19 pandemic. I spoke with the patient via phone to ensure availability of phone/video source, confirm preferred email & phone number, and discuss instructions  and expectations.  I reminded Stephen Stein to be prepared with any vital sign and/or heart rhythm information that could potentially be obtained via home monitoring, at the time of his visit. I reminded Stephen Stein to expect a phone call at the time of his visit if his visit.  Did the patient verbally acknowledge consent to treatment? YES  Stephen Stein, [place] 04/01/2019 2:07 PM   DOWNLOADING THE [place], go to [employer] and type in WebEx in the search bar. [place] [employer], the blue/green circle. The app is free but as with any other app downloads, their phone may require them to verify saved payment information or Apple password. The patient does NOT have to create an account.  - If Android, ask patient to go to [employer] and type in WebEx in the search bar. [place] [employer], the blue/green circle. The app is free but as with any other app downloads, their phone may require them to verify saved payment information or Android password. The patient does NOT have to create an account.   CONSENT FOR TELE-HEALTH VISIT - PLEASE REVIEW  I hereby voluntarily request, consent and authorize [facility] and its employed or contracted physicians, physician assistants, nurse practitioners or other licensed health care professionals (the Practitioner), to provide me with telemedicine health care services (the "Services") as deemed necessary by the treating Practitioner. I acknowledge  and consent to receive the Services by the Practitioner via telemedicine. I understand that the telemedicine visit will involve communicating with the Practitioner through live audiovisual communication technology and the disclosure of certain medical information by electronic transmission. I acknowledge that I have been given the opportunity to request an in-person assessment or other available alternative prior to the telemedicine visit and am voluntarily  participating in the telemedicine visit.  I understand that I have the right to withhold or withdraw my consent to the use of telemedicine in the course of my care at any time, without affecting my right to future care or treatment, and that the Practitioner or I may terminate the telemedicine visit at any time. I understand that I have the right to inspect all information obtained and/or recorded in the course of the telemedicine visit and may receive copies of available information for a reasonable fee.  I understand that some of the potential risks of receiving the Services via telemedicine include:  [name] Delay or interruption in medical evaluation due to technological equipment failure or disruption; . Information transmitted may not be sufficient (e.g. poor resolution of images) to allow for appropriate medical decision making by the Practitioner; and/or  . In rare instances, security protocols could fail, causing a breach of personal health information.  Furthermore, I acknowledge that it is my responsibility to provide information about my medical history, conditions and care that is complete and accurate to the best of my ability. I acknowledge that Practitioner's advice, recommendations, and/or decision may be based on factors not within their control, such as incomplete or inaccurate data provided by me or distortions of diagnostic images or specimens that may result from electronic transmissions. I understand that the practice of medicine is not an exact science and that Practitioner makes no warranties or guarantees regarding treatment outcomes. I acknowledge that I will receive a copy of this consent concurrently upon execution via email to the email address I last provided but may also request a printed copy by calling the office of [place].    I understand that my insurance will be billed for this visit.   I have read or had this consent read to me. . I understand the contents of this  consent, which adequately explains the benefits and risks of the Services being provided via telemedicine.  . I have been provided ample opportunity to ask questions regarding this consent and the Services and have had my questions answered to my satisfaction. . I give my informed consent for the services to be provided through the use of telemedicine in my medical care  By participating in this telemedicine visit I agree to the above.

## 2019-04-02 ENCOUNTER — Ambulatory Visit: Payer: Self-pay

## 2019-04-02 ENCOUNTER — Encounter: Payer: Self-pay | Admitting: Speech Pathology

## 2019-04-02 ENCOUNTER — Ambulatory Visit: Payer: PPO | Admitting: Psychology

## 2019-04-05 ENCOUNTER — Ambulatory Visit: Payer: Self-pay

## 2019-04-05 ENCOUNTER — Encounter: Payer: Self-pay | Admitting: Speech Pathology

## 2019-04-08 ENCOUNTER — Encounter: Payer: Self-pay | Admitting: Family Medicine

## 2019-04-08 NOTE — Telephone Encounter (Signed)
Please respond to patient; his visit should be virtual, NOT in person

## 2019-04-09 ENCOUNTER — Encounter: Payer: Self-pay | Admitting: Speech Pathology

## 2019-04-09 ENCOUNTER — Ambulatory Visit: Payer: Self-pay

## 2019-04-09 NOTE — Therapy (Signed)
[place] MAIN [facility] SERVICES [address] [place], [place], [address] Phone: [phone]   Fax:  [phone]  Patient Details  Name: Stephen Stein MRN: [id number] Date of Birth: Nov 04, 1941 Referring Provider:  No ref. provider found  Encounter Date: 04/09/2019   The [facility] outpatient clinics are closed at this time due to COVID-19. The patient was contacted in regards to his therapy services. The patient declines any telehealth therapy services at this time. The patient is in agreement that he is safe and consents to being on hold for therapy services until the [facility] outpatient facilities reopen. At tht time the patient will be contacted to schedule an appointment to resume therapy services.     Lyndel Safe Huprich PT, DPT, GCS  Huprich,Jason 04/09/2019, 4:16 PM  [place] MAIN [facility] SERVICES [address] [place], [place], [address] Phone: [phone]   Fax:  [phone]

## 2019-04-11 ENCOUNTER — Other Ambulatory Visit: Payer: Self-pay

## 2019-04-11 ENCOUNTER — Telehealth: Payer: Self-pay | Admitting: Family Medicine

## 2019-04-11 ENCOUNTER — Encounter: Payer: Self-pay | Admitting: Speech Pathology

## 2019-04-11 ENCOUNTER — Encounter: Payer: Self-pay | Admitting: Family Medicine

## 2019-04-11 ENCOUNTER — Ambulatory Visit (INDEPENDENT_AMBULATORY_CARE_PROVIDER_SITE_OTHER): Payer: PPO | Admitting: Family Medicine

## 2019-04-11 ENCOUNTER — Ambulatory Visit: Payer: Self-pay

## 2019-04-11 VITALS — BP 130/82

## 2019-04-11 DIAGNOSIS — R42 Dizziness and giddiness: Secondary | ICD-10-CM

## 2019-04-11 DIAGNOSIS — E538 Deficiency of other specified B group vitamins: Secondary | ICD-10-CM

## 2019-04-11 DIAGNOSIS — I10 Essential (primary) hypertension: Secondary | ICD-10-CM

## 2019-04-11 DIAGNOSIS — R413 Other amnesia: Secondary | ICD-10-CM

## 2019-04-11 DIAGNOSIS — Z8659 Personal history of other mental and behavioral disorders: Secondary | ICD-10-CM

## 2019-04-11 DIAGNOSIS — E782 Mixed hyperlipidemia: Secondary | ICD-10-CM

## 2019-04-11 NOTE — Progress Notes (Signed)
BP 130/82 (BP Location: Left Arm, Patient Position: Sitting, Cuff Size: Normal)    Subjective:    Patient ID: Stephen Stein, male    DOB: [birth year], 78 y.o.   MRN: [id number]  HPI: Stephen Stein is a 78 y.o. male  Chief Complaint  Patient presents with  . Follow-up    HPI Virtual Visit via Telephone/Video Note   I connected with the patient via:  telephone I verified that I am speaking with the correct person using two identifiers.  Call started: 3:32 pm Call terminated: 3:52 pm Total length of call: 20 minutes and 8 seconds   I discussed the limitations, risks, and privacy concerns of performing an evaluation and management service by telephone and the availability of in-person appointments. I explained that he/she may be responsible for charges related to this service. The patient expressed understanding and agreed to proceed.  Provider location: home, upstairs office with door closed, earphones/headset on Patient location: home Additional participants: no one   Husband's wife has cousin has dizziness, uses clonazepam; he wondered if that might be an option; he still has dizziness and light-headedness Dr. Manuella Ghazi thought that possibly he had a stroke Waking up tired every morning; still having episodes of dizziness He has been through language therapy and physical therapy Referral from February for psychologist; will see him May 19th He completed speech therapy; Manuela Schwartz, he liked her very much He was in the middle of physical therapy when the Coronavirus hit, so he has not been able to get back in recently He still has a little trouble walking without bumping into things; that's not so bad, just the feeling tired all the time He does not hurt herself if he trips and falls; sometimes he doesn't raise his foot up and just mechanical trip hitting the step; no head injuries, no LOC He mentioned pizza and playing cards with friends; I cautioned about social distancing He is  confused a lot and not speaking well; his writing has gotten worse too I explained that he is at high risk for complications of CHENI-77 BP is well-controlled Heart doctors checked him out, two different doctors; it's not his heart he says High cholesterol; using rosuvastatin Using the sertraline, helping with mood; wife says that it is working better per patient's report I asked about suicidality; sertraline helps him keep his mind of that; he would not contemplate suicide and never would do anything to hurt himself; "no, I sure wouldn't" he assured me; wife is very supportive   Fall Risk  04/11/2019 02/04/2019 12/14/2018 10/12/2018 09/24/2018  Falls in the past year? 0 1 0 Yes No  Number falls in past yr: - 1 - 2 or more -  Injury with Fall? - 0 - No -  Comment - - - - -   Relevant past medical, surgical, family and social history reviewed Past Medical History:  Diagnosis Date  . 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 Biliary dyskinesia    a.08/2018 s/p lap chole.  [name] BPH (benign prostatic hyperplasia)   . Carotid atherosclerosis, bilateral 02/20/2019   Feb 2020  . Coronary artery calcification seen on CT scan    a. 11/2015 Cardiac CT: Cor Ca2+ = 892 (80th %'ile). Diffuse Ca2+ in all 3 major coronary arteries; b. 02/2016 MV: HTN response to exercise. Abnl ecg w/ horizontal inflat ST depression, but no ischemia/infarct on imaging; c. 09/2018 MV: EF 64%, no ischemia. Very sm  region of mild distal antsept defect, likely 2/2 lbbb.  . Hyperlipidemia   . Hypertension    For irregular heart beat  . Irregular heartbeat/Palpitations    a. Has been taking Atenolol for 20 years  . LBBB (left bundle branch block)   . Left ventricular hypokinesis    a. 09/2018 Echo: EF 45-50%, mod LVH, diff HK. Gr1 DD. Triv AI. Mildly dil Ao root. Mildly reduced RV fxn.  . Lightheadedness   . NSVT (nonsustained ventricular tachycardia) ([place])    a. 11/2018 Zio monitor - 13 beats of  NSVT--asymptomatic.  . Orthostatic hypotension    a. 11/2018 pressures improved w/ midodrine/florinef but constant lightheadedness persists.  . Sinus bradycardia    a. 11/2018 Zio monitor: Avg HR 57 (min 28). Intermittent Mobitz I.   Past Surgical History:  Procedure Laterality Date  . CHOLECYSTECTOMY N/A 09/13/2018   Procedure: LAPAROSCOPIC CHOLECYSTECTOMY;  Surgeon: Jules Husbands, MD;  Location: [facility] ORS;  Service: General;  Laterality: N/A;  . COLONOSCOPY  2008  . ESOPHAGOGASTRODUODENOSCOPY (EGD) WITH PROPOFOL N/A 08/20/2018   Procedure: ESOPHAGOGASTRODUODENOSCOPY (EGD) WITH PROPOFOL;  Surgeon: Lucilla Lame, MD;  Location: [facility] ENDOSCOPY;  Service: Endoscopy;  Laterality: N/A;  . TONSILLECTOMY AND ADENOIDECTOMY  1955   Family History  Problem Relation Age of Onset  . Cancer Mother        lung  . Heart disease Father   . Hearing loss Sister        coclear implant  . Hearing loss Brother   . Cancer Paternal Grandmother   . Cancer Son        colon cancer  . Colon cancer Neg Hx   . Stomach cancer Neg Hx    Social History   Tobacco Use  . Smoking status: Former Smoker    Last attempt to quit: 06/22/1992    Years since quitting: 26.8  . Smokeless tobacco: Former Systems developer    Quit date: 06/22/1992  Substance Use Topics  . Alcohol use: Yes    Alcohol/week: 1.0 standard drinks    Types: 1 Cans of beer per week    Comment: occasional  . Drug use: No     Office Visit from 04/11/2019 in [facility]  AUDIT-C Score  0      Interim medical history since last visit reviewed. Allergies and medications reviewed  Review of Systems Per HPI unless specifically indicated above     Objective:    BP 130/82 (BP Location: Left Arm, Patient Position: Sitting, Cuff Size: Normal)   Wt Readings from Last 3 Encounters:  02/04/19 186 lb 1.6 oz (84.4 kg)  01/24/19 187 lb (84.8 kg)  01/16/19 189 lb 12 oz (86.1 kg)    Physical Exam Pulmonary:     Effort: No respiratory  distress.  Neurological:     Mental Status: He is alert.  Psychiatric:        Speech: Speech is not rapid and pressured, delayed or slurred.     Results for orders placed or performed during the hospital encounter of 10/15/18  TSH  Result Value Ref Range   TSH 2.331 0.350 - 4.500 uIU/mL  Vitamin B12  Result Value Ref Range   Vitamin B-12 320 180 - 914 pg/mL  Cortisol  Result Value Ref Range   Cortisol, Plasma 11.6 ug/dL  ACTH  Result Value Ref Range   C206 ACTH 38.0 7.2 - 63.3 pg/mL      Assessment & Plan:   Problem List Items Addressed  This Visit      Cardiovascular and Mediastinum   Essential hypertension (Chronic)    controlled        Other   Vitamin B12 deficiency    Continue supp      Relevant Orders   CBC   B12   Memory impairment    Seeing neurologist      Hyperlipidemia    Continue statin      Relevant Orders   Lipid panel   Hx of major depression    Patient reports good results with sertraline; denies SI       Other Visit Diagnoses    Dizziness    -  Primary   I will be glad to ask neurologist if he thinks a benzo would be appropriate; will get back to patient once we have discussed   Relevant Orders   COMPLETE METABOLIC PANEL WITH GFR   CBC       Follow up plan: No follow-ups on file.  No orders of the defined types were placed in this encounter.   Orders Placed This Encounter  Procedures  . COMPLETE METABOLIC PANEL WITH GFR  . CBC  . B12  . Lipid panel

## 2019-04-11 NOTE — Telephone Encounter (Signed)
Ask Dr. Manuella Ghazi what he thinks about using clonazepam for episodic dizziness His wife's cousin uses that and he just wondered I'll call him

## 2019-04-16 ENCOUNTER — Telehealth: Payer: Self-pay | Admitting: Family Medicine

## 2019-04-16 ENCOUNTER — Ambulatory Visit: Payer: Self-pay

## 2019-04-16 ENCOUNTER — Encounter: Payer: Self-pay | Admitting: Speech Pathology

## 2019-04-16 NOTE — Assessment & Plan Note (Signed)
controlled 

## 2019-04-16 NOTE — Assessment & Plan Note (Signed)
Seeing neurologist.

## 2019-04-16 NOTE — Telephone Encounter (Signed)
I am unable to leave a voicemail for Dr. Manuella Ghazi after hours without it going to the on-call provider; I will have to call back during office hours Re: starting patient on low dose benzo to see if helpful for dizziness  ------------------------------------------------------------------------------  Stephen Stein, please call Dr. Trena Platt office and ask if he will call me on my personal cell phone about this patient and whether or not low dose benzo might help his dizziness; thank you

## 2019-04-16 NOTE — Assessment & Plan Note (Signed)
Continue statin. 

## 2019-04-16 NOTE — Assessment & Plan Note (Signed)
Continue supp

## 2019-04-16 NOTE — Assessment & Plan Note (Signed)
Patient reports good results with sertraline; denies SI

## 2019-04-17 NOTE — Telephone Encounter (Signed)
Left message with staff to have dr. Manuella Ghazi call on cell phone about this patient

## 2019-04-17 NOTE — Telephone Encounter (Signed)
Pt called to see if Dr. Sanda Klein was given the ok for Pt to try klonopin / please advise

## 2019-04-17 NOTE — Telephone Encounter (Signed)
There are two notes open about this; signing off on this one

## 2019-04-17 NOTE — Telephone Encounter (Signed)
I tried to call patient personally to let him know that I'm in favor of it, just wanting to clear with neurologist; I'll let him know when we get a chance to speak

## 2019-04-18 ENCOUNTER — Encounter: Payer: Self-pay | Admitting: Speech Pathology

## 2019-04-18 ENCOUNTER — Ambulatory Visit: Payer: Self-pay

## 2019-04-18 ENCOUNTER — Encounter: Payer: Self-pay | Admitting: Family Medicine

## 2019-04-18 MED ORDER — CLONAZEPAM 0.5 MG PO TABS: 0.2500 mg | ORAL_TABLET | Freq: Two times a day (BID) | ORAL | 0 refills | Status: DC | PRN

## 2019-04-18 NOTE — Addendum Note (Signed)
Addended by: LADA, Satira Anis on: 04/18/2019 05:23 PM   Modules accepted: Orders

## 2019-04-18 NOTE — Telephone Encounter (Signed)
Please call Dr. Trena Platt office again and see if he can give me just a quick call about whether or not 2 mg of diazepam BID or other low dose regimen of benzo would be worth a try; ask him to call me on my cell

## 2019-04-18 NOTE — Telephone Encounter (Signed)
See lengthy MyChart message PMP Aware reviewed Rx sent to pharmacy

## 2019-04-18 NOTE — Telephone Encounter (Signed)
He called me back Okay to use benzo Clonazepam 0.5 mg take half pill to start BID prn I thanked him for consultation ----------------------------------------------- I called patient to let him know we can certainly try the low dose benzo Reached identified voicemail; left message that I will send a MyChart message for him to read in a few minutes We'll try the medicine  MyChart message Cautioned about fall risk, worsening depression; stop and seek help right away if any problems Do not drink alcohol with this, within six hours

## 2019-04-18 NOTE — Telephone Encounter (Signed)
They will give him the message again

## 2019-04-18 NOTE — Telephone Encounter (Signed)
Dr. Manuella Ghazi messaged me that he would call after patient

## 2019-04-20 NOTE — Progress Notes (Signed)
Virtual Visit via Video Note   This visit type was conducted due to national recommendations for restrictions regarding the COVID-19 Pandemic (e.g. social distancing) in an effort to limit this patient's exposure and mitigate transmission in our community.  Due to his co-morbid illnesses, this patient is at least at moderate risk for complications without adequate follow up.  This format is felt to be most appropriate for this patient at this time.  All issues noted in this document were discussed and addressed.  A limited physical exam was performed with this format.  Please refer to the patient's chart for his consent to telehealth for [facility].   I connected with  Stephen Stein on 04/20/19 by a video enabled telemedicine application and verified that I am speaking with the correct person using two identifiers. I discussed the limitations of evaluation and management by telemedicine. The patient expressed understanding and agreed to proceed.   Evaluation Performed:  Follow-up visit  Date:  04/20/2019   ID:  Stephen Stein, Stephen Stein December 26, 1941, MRN [id number]  Patient Location:  [place] [address]   Provider location:   [facility], [place] office  PCP:  Arnetha Courser, MD  Cardiologist:  Patsy Baltimore   Chief Complaint:  Dizziness  History of Present Illness:    Stephen Stein is a 78 y.o. male who presents via audio/video conferencing for a telehealth visit today.   The patient does not symptoms concerning for COVID-19 infection (fever, chills, cough, or new SHORTNESS OF BREATH).   Patient has a past medical history of smoking history, stopped 20 years ago,  hyperlipidemia,  palpitations, treated with atenolol,  CT coronary calcium score with score  800,  Minor carotid intimal thickening and atherosclerosis. stress testing showing no significant ischemia  who presents for routine follow-up Of his coronary artery disease    On clonazepam, 0.25 BID On sertraline Helps with "air headedness" Does not help leg strength Able to get around the house,  If he takes time, no falls Feels sleepy Walks daily with dogs, one mile No palpitations  6-8-month course of rapid declining cognitive status in the context of a non-positional dizziness. Minor carotid dz on u/s 01/2019  nausea and emesis.   gallbladder disease and underwent cholecystectomy with a modest improvement in the GI symptoms.    His dizziness is non-positional.  He awakens with it.  It is not aggravated by movement nor standing nor relieved by sitting.  It often lasts most of the day.  It is associated with gait instability. progressive cognitive impairment.   Seen in clinic 09/25/2018: dizziness Dizzy 3 months ago 37 pound weight loss for N/V, diarrhea,  BP drop, mildly better on midorine 5 mg TID Worked for about 1 day and then stopped working it would seem Some dizzy supine, Much worse sitting and standing  Previously very active Was traveling, hiking Denies any significant chest pain or shortness of breath on exertion He thinks he might have dementia, memory is not quite what it used to be  Orthostatics done today heart rate 54 rising up to 80 with standing after 3 minutes Blood pressure 118/68 supine down to 109/66 sitting standing around 108/60 standing even after 3 minutes Felt lightheaded with sitting  Other past medical history reviewed CT coronary calcium score,800 He has three-vessel coronary artery disease, no significant aortic atherosclerosis  Previous stress test showing no significant ischemia.  Previous EKG, EKG on  today's visit shows normal sinus rhythm with rate 52 bpm, unable to exclude old anterior MI Prior EKG with primary care shows similar finding of possible old anterior MI, Finding of prolonged QT and T-wave abnormality is not particularly impressive  He reports having stress test 15 or 20 years ago  for irregular heartbeats, details unavailable Never had echocardiogram or cardiac catheterization  Prior CV studies:   The following studies were reviewed today:  Echocardiogram October 09, 2018 Ejection fraction 45 to 50%, moderate LVH  Stress test October 17, 2018 No significant ischemia, fraction estimated 65% Left bundle branch block  CT coronary calcium score,800 He has three-vessel coronary artery disease, no significant aortic atherosclerosis   Past Medical History:  Diagnosis Date  . 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 Biliary dyskinesia    a.08/2018 s/p lap chole.  [name] BPH (benign prostatic hyperplasia)   . Carotid atherosclerosis, bilateral 02/20/2019   Feb 2020  . Coronary artery calcification seen on CT scan    a. 11/2015 Cardiac CT: Cor Ca2+ = 892 (80th %'ile). Diffuse Ca2+ in all 3 major coronary arteries; b. 02/2016 MV: HTN response to exercise. Abnl ecg w/ horizontal inflat ST depression, but no ischemia/infarct on imaging; c. 09/2018 MV: EF 64%, no ischemia. Very sm region of mild distal antsept defect, likely 2/2 lbbb.  . Hyperlipidemia   . Hypertension    For irregular heart beat  . Irregular heartbeat/Palpitations    a. Has been taking Atenolol for 20 years  . LBBB (left bundle branch block)   . Left ventricular hypokinesis    a. 09/2018 Echo: EF 45-50%, mod LVH, diff HK. Gr1 DD. Triv AI. Mildly dil Ao root. Mildly reduced RV fxn.  . Lightheadedness   . NSVT (nonsustained ventricular tachycardia) ([place])    a. 11/2018 Zio monitor - 13 beats of NSVT--asymptomatic.  . Orthostatic hypotension    a. 11/2018 pressures improved w/ midodrine/florinef but constant lightheadedness persists.  . Sinus bradycardia    a. 11/2018 Zio monitor: Avg HR 57 (min 28). Intermittent Mobitz I.   Past Surgical History:  Procedure Laterality Date  . CHOLECYSTECTOMY N/A 09/13/2018   Procedure: LAPAROSCOPIC CHOLECYSTECTOMY;  Surgeon: Jules Husbands,  MD;  Location: [facility] ORS;  Service: General;  Laterality: N/A;  . COLONOSCOPY  2008  . ESOPHAGOGASTRODUODENOSCOPY (EGD) WITH PROPOFOL N/A 08/20/2018   Procedure: ESOPHAGOGASTRODUODENOSCOPY (EGD) WITH PROPOFOL;  Surgeon: Lucilla Lame, MD;  Location: [facility] ENDOSCOPY;  Service: Endoscopy;  Laterality: N/A;  . TONSILLECTOMY AND ADENOIDECTOMY  1955     No outpatient medications have been marked as taking for the 04/22/19 encounter (Appointment) with Minna Merritts, MD.     Allergies:   Patient has no known allergies.   Social History   Tobacco Use  . Smoking status: Former Smoker    Last attempt to quit: 06/22/1992    Years since quitting: 26.8  . Smokeless tobacco: Former Systems developer    Quit date: 06/22/1992  Substance Use Topics  . Alcohol use: Yes    Alcohol/week: 1.0 standard drinks    Types: 1 Cans of beer per week    Comment: occasional  . Drug use: No     Current Outpatient Medications on File Prior to Visit  Medication Sig Dispense Refill  . aspirin 81 MG chewable tablet Chew 81 mg by mouth daily.    . clonazePAM (KLONOPIN) 0.5 MG tablet Take 0.5 tablets (0.25 mg total) by mouth 2 (two)  times daily as needed. (for dizziness) 5 tablet 0  . Cyanocobalamin (VITAMIN B-12 PO) Take 1 tablet by mouth once daily    . meloxicam (MOBIC) 7.5 MG tablet Take 0.5-1 tablets (3.75-7.5 mg total) by mouth daily as needed for pain.    . rosuvastatin (CRESTOR) 10 MG tablet Take 1 tablet (10 mg total) by mouth daily. 90 tablet 1  . saw palmetto 160 MG capsule Take 160 mg by mouth 2 (two) times daily.    . sertraline (ZOLOFT) 50 MG tablet Take 25 mg by mouth daily. Take 25 mg one tablet daily for 2 weeks.  Increase 25 mg to 50 mg one tablet daily thereafter.     No current facility-administered medications on file prior to visit.      Family Hx: The patient's family history includes Cancer in his mother, paternal grandmother, and son; Hearing loss in his brother and sister; Heart disease in his father.  There is no history of Colon cancer or Stomach cancer.  ROS:   Please see the history of present illness.    Review of Systems  Constitutional: Negative.   Respiratory: Negative.   Cardiovascular: Negative.   Gastrointestinal: Negative.   Musculoskeletal: Negative.   Neurological: Positive for dizziness.  Psychiatric/Behavioral: Negative.   All other systems reviewed and are negative.    Labs/Other Tests and Data Reviewed:    Recent Labs: 08/19/2018: Hemoglobin 14.8; Platelets 190 09/05/2018: ALT 21; BUN 10; Creat 1.13; Potassium 3.7; Sodium 136 10/15/2018: TSH 2.331   Recent Lipid Panel Lab Results  Component Value Date/Time   CHOL 95 08/07/2018 08:55 AM   CHOL 110 06/01/2016 08:40 AM   TRIG 90 08/07/2018 08:55 AM   HDL 33 (L) 08/07/2018 08:55 AM   HDL 39 (L) 06/01/2016 08:40 AM   CHOLHDL 2.9 08/07/2018 08:55 AM   LDLCALC 45 08/07/2018 08:55 AM    Wt Readings from Last 3 Encounters:  02/04/19 186 lb 1.6 oz (84.4 kg)  01/24/19 187 lb (84.8 kg)  01/16/19 189 lb 12 oz (86.1 kg)     Exam:    Vital Signs: Vital signs may also be detailed in the HPI There were no vitals taken for this visit.  Wt Readings from Last 3 Encounters:  02/04/19 186 lb 1.6 oz (84.4 kg)  01/24/19 187 lb (84.8 kg)  01/16/19 189 lb 12 oz (86.1 kg)   Temp Readings from Last 3 Encounters:  02/04/19 98.7 F (37.1 C)  12/14/18 98.2 F (36.8 C)  10/12/18 97.9 F (36.6 C)   BP Readings from Last 3 Encounters:  04/11/19 130/82  02/19/19 135/72  02/14/19 (!) 144/72   Pulse Readings from Last 3 Encounters:  02/19/19 70  02/14/19 71  02/04/19 82    135/76, 67 pulse resp 16  Well nourished, well developed male in no acute distress. Constitutional:  oriented to person, place, and time. No distress.    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,  asymptomatic Left bundle branch block Stress test no ischemia Echocardiogram mildly  depressed ejection fraction likely secondary to left bundle  Mixed hyperlipidemia - Plan: EKG 12-Lead On Crestor  Numbers at goal  Coronary artery disease involving native coronary artery of native heart without angina pectoris - Plan: EKG 12-Lead stress test with no significant ischemia No further work-up, he prefers to stay on Crestor  Dizziness Neurologic issue, better with sertraline and clonazepam Notes from neurology indicating cognitive issues,  No  syncope Overall relatively functional on today's evaluation  Protein calorie malnutrition Better after gall bladder surgery Feels his weight is stable   COVID-19 Education: The signs and symptoms of COVID-19 were discussed with the patient and how to seek care for testing (follow up with PCP or arrange E-visit).  The importance of social distancing was discussed today.  Patient Risk:   After full review of this patients clinical status, I feel that they are at least moderate risk at this time.  Time:   Today, I have spent 25 minutes with the patient with telehealth technology discussing the cardiac and medical problems/diagnoses detailed above   10 min spent reviewing the chart prior to patient visit today   Medication Adjustments/Labs and Tests Ordered: Current medicines are reviewed at length with the patient today.  Concerns regarding medicines are outlined above.   Tests Ordered: No tests ordered   Medication Changes: No changes made   Disposition: Follow-up in 6 months   Signed, Ida Rogue, MD  04/20/2019 12:07 PM    [facility] [id number], [place], [place] [address]

## 2019-04-22 ENCOUNTER — Encounter: Payer: Self-pay | Admitting: Speech Pathology

## 2019-04-22 ENCOUNTER — Telehealth (INDEPENDENT_AMBULATORY_CARE_PROVIDER_SITE_OTHER): Payer: PPO | Admitting: Cardiovascular Disease

## 2019-04-22 ENCOUNTER — Telehealth: Payer: Self-pay | Admitting: Family Medicine

## 2019-04-22 ENCOUNTER — Other Ambulatory Visit: Payer: Self-pay

## 2019-04-22 DIAGNOSIS — G309 Alzheimer's disease, unspecified: Secondary | ICD-10-CM | POA: Diagnosis not present

## 2019-04-22 DIAGNOSIS — F329 Major depressive disorder, single episode, unspecified: Secondary | ICD-10-CM

## 2019-04-22 DIAGNOSIS — F0150 Vascular dementia without behavioral disturbance: Secondary | ICD-10-CM

## 2019-04-22 DIAGNOSIS — R42 Dizziness and giddiness: Secondary | ICD-10-CM

## 2019-04-22 DIAGNOSIS — I10 Essential (primary) hypertension: Secondary | ICD-10-CM | POA: Diagnosis not present

## 2019-04-22 DIAGNOSIS — I25118 Atherosclerotic heart disease of native coronary artery with other forms of angina pectoris: Secondary | ICD-10-CM | POA: Diagnosis not present

## 2019-04-22 DIAGNOSIS — E782 Mixed hyperlipidemia: Secondary | ICD-10-CM

## 2019-04-22 DIAGNOSIS — F0280 Dementia in other diseases classified elsewhere without behavioral disturbance: Secondary | ICD-10-CM | POA: Diagnosis not present

## 2019-04-22 DIAGNOSIS — Z8659 Personal history of other mental and behavioral disorders: Secondary | ICD-10-CM

## 2019-04-22 DIAGNOSIS — I441 Atrioventricular block, second degree: Secondary | ICD-10-CM

## 2019-04-22 DIAGNOSIS — E43 Unspecified severe protein-calorie malnutrition: Secondary | ICD-10-CM

## 2019-04-22 DIAGNOSIS — F32A Depression, unspecified: Secondary | ICD-10-CM

## 2019-04-22 MED ORDER — CLONAZEPAM 0.5 MG PO TABS: 0.2500 mg | ORAL_TABLET | Freq: Two times a day (BID) | ORAL | 0 refills | Status: DC | PRN

## 2019-04-22 NOTE — Telephone Encounter (Signed)
Pt.notified

## 2019-04-22 NOTE — Patient Instructions (Signed)

## 2019-04-22 NOTE — Telephone Encounter (Signed)
Copied from [place] [phone]. Topic: General - Inquiry >> Apr 22, 2019  9:49 AM Pauline Good wrote: Reason for CRM: wanted to let the doctor know the clonazepam he is taking is working. Please advise

## 2019-04-22 NOTE — Telephone Encounter (Signed)
Notations reviewed from Dr. Sanda Klein in detail, refill for 90 day supply is sent in. Reviewed case with Dr. Ancil Boozer, attending physician.  Please notify patient that this was sent in to the pharmacy.

## 2019-04-22 NOTE — Telephone Encounter (Signed)
Copied from [place] [id number]. Topic: General - Inquiry >> Apr 22, 2019  9:49 AM Pauline Good wrote: Reason for CRM: wanted to let the doctor know the clonazepam he is taking is working. Please advise

## 2019-04-23 ENCOUNTER — Telehealth: Payer: Self-pay

## 2019-04-23 NOTE — Telephone Encounter (Signed)
Provider notified and refills sent in.  Copied from [place] [phone]. Topic: General - Inquiry >> Apr 22, 2019  9:49 AM Pauline Good wrote: Reason for CRM: wanted to let the doctor know the clonazepam he is taking is working. Please advise

## 2019-04-25 ENCOUNTER — Ambulatory Visit: Payer: Self-pay

## 2019-05-14 ENCOUNTER — Ambulatory Visit: Payer: PPO | Admitting: Physical Therapy

## 2019-05-14 ENCOUNTER — Ambulatory Visit: Payer: PPO | Admitting: Psychology

## 2019-05-15 ENCOUNTER — Other Ambulatory Visit: Payer: Self-pay

## 2019-05-15 ENCOUNTER — Ambulatory Visit (INDEPENDENT_AMBULATORY_CARE_PROVIDER_SITE_OTHER): Payer: PPO | Admitting: Psychology

## 2019-05-15 ENCOUNTER — Encounter: Payer: Self-pay | Admitting: Physical Therapy

## 2019-05-15 ENCOUNTER — Ambulatory Visit: Payer: PPO | Attending: Neurology | Admitting: Physical Therapy

## 2019-05-15 ENCOUNTER — Ambulatory Visit: Payer: PPO | Admitting: Psychology

## 2019-05-15 DIAGNOSIS — G8929 Other chronic pain: Secondary | ICD-10-CM | POA: Diagnosis not present

## 2019-05-15 DIAGNOSIS — M25512 Pain in left shoulder: Secondary | ICD-10-CM | POA: Diagnosis not present

## 2019-05-15 DIAGNOSIS — M25511 Pain in right shoulder: Secondary | ICD-10-CM | POA: Insufficient documentation

## 2019-05-15 DIAGNOSIS — M6281 Muscle weakness (generalized): Secondary | ICD-10-CM | POA: Diagnosis not present

## 2019-05-15 DIAGNOSIS — F4320 Adjustment disorder, unspecified: Secondary | ICD-10-CM | POA: Diagnosis not present

## 2019-05-15 DIAGNOSIS — R2681 Unsteadiness on feet: Secondary | ICD-10-CM

## 2019-05-15 NOTE — Therapy (Signed)
[place] MAIN [facility] SERVICES [address] [place], [place], [address] Phone: [phone]   Fax:  [phone]  Physical Therapy Evaluation  Patient Details  Name: Stephen Stein MRN: [id number] Date of Birth: October 11, 1941 Referring Provider (PT): Enid Derry   Encounter Date: 05/15/2019  PT End of Session - 05/15/19 1008    Visit Number  1    Number of Visits  17    Date for PT Re-Evaluation  07/10/19    PT Start Time  1000    PT Stop Time  1045    PT Time Calculation (min)  45 min    Equipment Utilized During Treatment  Gait belt    Activity Tolerance  Patient tolerated treatment well    Behavior During Therapy  [facility] for tasks assessed/performed       Past Medical History:  Diagnosis Date  . 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 Biliary dyskinesia    a.08/2018 s/p lap chole.  [name] BPH (benign prostatic hyperplasia)   . Carotid atherosclerosis, bilateral 02/20/2019   Feb 2020  . Coronary artery calcification seen on CT scan    a. 11/2015 Cardiac CT: Cor Ca2+ = 892 (80th %'ile). Diffuse Ca2+ in all 3 major coronary arteries; b. 02/2016 MV: HTN response to exercise. Abnl ecg w/ horizontal inflat ST depression, but no ischemia/infarct on imaging; c. 09/2018 MV: EF 64%, no ischemia. Very sm region of mild distal antsept defect, likely 2/2 lbbb.  . Hyperlipidemia   . Hypertension    For irregular heart beat  . Irregular heartbeat/Palpitations    a. Has been taking Atenolol for 20 years  . LBBB (left bundle branch block)   . Left ventricular hypokinesis    a. 09/2018 Echo: EF 45-50%, mod LVH, diff HK. Gr1 DD. Triv AI. Mildly dil Ao root. Mildly reduced RV fxn.  . Lightheadedness   . NSVT (nonsustained ventricular tachycardia) ([place])    a. 11/2018 Zio monitor - 13 beats of NSVT--asymptomatic.  . Orthostatic hypotension    a. 11/2018 pressures improved w/ midodrine/florinef but constant lightheadedness persists.  .  Sinus bradycardia    a. 11/2018 Zio monitor: Avg HR 57 (min 28). Intermittent Mobitz I.    Past Surgical History:  Procedure Laterality Date  . CHOLECYSTECTOMY N/A 09/13/2018   Procedure: LAPAROSCOPIC CHOLECYSTECTOMY;  Surgeon: Jules Husbands, MD;  Location: [facility] ORS;  Service: General;  Laterality: N/A;  . COLONOSCOPY  2008  . ESOPHAGOGASTRODUODENOSCOPY (EGD) WITH PROPOFOL N/A 08/20/2018   Procedure: ESOPHAGOGASTRODUODENOSCOPY (EGD) WITH PROPOFOL;  Surgeon: Lucilla Lame, MD;  Location: [facility] ENDOSCOPY;  Service: Endoscopy;  Laterality: N/A;  . TONSILLECTOMY AND ADENOIDECTOMY  1955    There were no vitals filed for this visit.   Subjective Assessment - 05/15/19 1003    Subjective  Patient reports that he has balance deificts and he falls pretty often.     Pertinent History  Pt reports that he has been sick for the last 6 months. He states that he had his gallbladder out and since then he has been having a lot of fatigue and feels like he is "in a fog." Pt reports that he has tried to stay active but it hasn't been helping. He is complaining of cognitive issues as well with difficulty remembering things that he otherwise was able to remember prior to the surgery. He has been dragging his feet and "walking like an old man." He does complain  of R sided tremor primarily. States that neurologist does not believe that he has Parkinson's. He denies feeling dizzy but feels more "air-headed." Denies any true vertigo. Pt states that he takes [facility] for his enlarged prostate however he still wakes up 4-5 times/night to urinate. He denies any snoring while sleeping. He reports decreased libido and erectile dysfunction since the surgery. Pt reports that his PCP has ordered "all kinds of lab tests" to try to determine the source of his fatigue. He stopped taking BP meds approximately 1 month ago with the consent of his MD. BP has been stable since that time. He started taking Vitamin B12 due to low B12  levels and they are now improved. He reports approximately 6 falls in the last 6 months. Denies numbness,tingling, or focal weakness.    Currently in Pain?  No/denies         [facility] PT Assessment - 05/15/19 1004      Assessment   Medical Diagnosis  balance deficits    Referring Provider (PT)  Enid Derry    Onset Date/Surgical Date  09/14/19    Hand Dominance  Right    Prior Therapy  Yes, previous therapy for shoulder pain      Balance Screen   Has the patient fallen in the past 6 months  Yes    How many times?  8    Has the patient had a decrease in activity level because of a fear of falling?   Yes    Is the patient reluctant to leave their home because of a fear of falling?   Yes      [place]  Private residence    Living Arrangements  Spouse/significant other    Available Help at Discharge  Family    Type of [place] to enter    Entrance Stairs-Number of Steps  4    Entrance Stairs-Rails  [place]  Multi-level;Laundry or work area in basement;Full bath on main level      Prior Function   Level of Independence  Independent    Vocation  Retired    Therapist, art, Previously worked for [employer], outdoor activities, wood activities      Cognition   Overall Cognitive Status  Impaired/Different from baseline         PAIN: No reports of pain  POSTURE:WFL  PROM/AROM:WFL  STRENGTH:  Graded on a 0-5 scale Muscle Group Left Right                          Hip Flex 5/5 5/5  Hip Abd 5/5 5/5  Hip Add 5/5 5/5  Hip Ext 5/5 5/5  Hip IR/ER 5/5 5/5  Knee Flex 5/5 5/5  Knee Ext 5/5 5/5  Ankle DF 5/5 5/5  Ankle PF 4/5 4/5       MUSCULOSKELETAL: Tremor: Mild tremor noted in R hand Tone: Normal, no clonus  Cranial Nerves Shoulder shrug strength is intact    Sensation Grossly intact to light touch bilateral UEs/LEs    Coordination/Cerebellar Finger to Nose: WNL Heel to Shin: WNL Rapid alternating movements: WNL  GAIT:Independent on flat surfaces and steps   OUTCOME MEASURES: TEST Outcome Interpretation  5 times sit<>stand 8.5 sec >60 yo, >15 sec indicates increased risk for falls  10 meter  walk test       1.0          m/s <1.0 m/s indicates increased risk for falls; limited community ambulator  Timed up and Go      7        sec <14 sec indicates increased risk for falls  ABC scale  62 percent  Increased risk for falls  Berg Balance Assessment 52/56 <36/56 (100% risk for falls), 37-45 (80% risk for falls); 46-51 (>50% risk for falls); 52-55 (lower risk <25% of falls)        Treatment: Single leg heel raises x 20 BLE Tandem standing and head turns with CGA in parallel bars x 1 mins x 3 reps Tapping feet to 4 inch stool x 20  Head truns in the corner with modified tandem stand x 3 mins Cues for safety and HEP instruction       Objective measurements completed on examination: See above findings.              PT Education - 05/15/19 1007    Education Details  plan of care    Person(s) Educated  Patient    Methods  Explanation    Comprehension  Verbalized understanding       PT Short Term Goals - 05/15/19 1015      PT SHORT TERM GOAL #1   Title  Pt will be independent with HEP in order to improve strength and balance in order to decrease fall risk and improve function at home and work.     Time  4    Period  Weeks    Status  New    Target Date  06/12/19        PT Long Term Goals - 05/15/19 1009      PT LONG TERM GOAL #1   Title  Pt will improve BERG by at least 3 points in order to demonstrate clinically significant improvement in balance.      Time  8    Period  Weeks    Status  New    Target Date  07/10/19      PT LONG TERM GOAL #2   Title  Patient will increase ABC scale score >80% to demonstrate better functional mobility and better confidence with ADLs.      Time  8    Period  Weeks    Status  New    Target Date  07/10/19      PT LONG TERM GOAL #3   Title  Patient will be independent in home exercise program to improve strength/mobility for better functional independence with ADLs.    Time  8    Period  Weeks    Status  New    Target Date  07/10/19      PT LONG TERM GOAL #4   Title  Pt will be able to balance with eyes closed on foam for at least 15 seconds in order to demonstrate improved balance in low light conditions to decrease risk for falls    Time  8    Period  Weeks    Status  New    Target Date  07/10/19             Plan - 05/15/19 1016    Clinical Impression Statement   Pt is a pleasant 78 year-old male referred for difficulty with balance and weakness.Patient has weakness BLE PF, and no  or sensory impairments noted.[name] 5TSTS, TUG, and 22m  gait speed all grossly WNL. BERG is 52/56 with difficulty in tandem and single leg balance.Patient has difficulty performing head turns during ambulation, tandem gait, and eyes closed ambulation. ABC of 62% indicates decreased balance confidence.[name] Pt presents with deficits in balance and he will benefit from skilled PT services to address these deficits to decrease risk for future falls.     Clinical Decision Making  Low    Rehab Potential  Good    Clinical Impairments Affecting Rehab Potential  chronic condition, arthritis    PT Frequency  2x / week    PT Duration  8 weeks    PT Treatment/Interventions  Cryotherapy;Electrical Stimulation;Ultrasound;Moist Heat;Iontophoresis 4mg /ml Dexamethasone;Therapeutic activities;Therapeutic exercise;Patient/family education;Neuromuscular re-education;Manual techniques;ADLs/Self Care Home Management;Aquatic Therapy;Canalith Repostioning;Traction;DME Instruction;Gait training;Stair training;Functional mobility training;Balance training;Passive range of motion;Dry needling;Energy conservation;Vestibular    PT Next Visit Plan  Initiate HEP, progress  strength/balance    PT Home Exercise Plan  To be initiated at next visit    Consulted and Agree with Plan of Care  Patient       Patient will benefit from skilled therapeutic intervention in order to improve the following deficits and impairments:  Impaired perceived functional ability, Decreased balance  Visit Diagnosis: Unsteadiness on feet  Muscle weakness (generalized)  Chronic left shoulder pain  Chronic right shoulder pain     Problem List Patient Active Problem List   Diagnosis Date Noted  . Carotid atherosclerosis, bilateral 02/20/2019  . Abnormal Holter exam 12/14/2018  . Vitamin B12 deficiency 10/15/2018  . Memory impairment 10/12/2018  . Hx of major depression 10/12/2018  . Biliary dyskinesia   . Protein-calorie malnutrition, severe 08/20/2018  . Gastritis without bleeding   . Duodenitis   . Nausea and vomiting 08/18/2018  . Left bundle branch block 06/25/2018  . Second degree AV block, Mobitz type I 06/25/2018  . Adrenal adenoma 05/09/2017  . Pulmonary nodules 04/19/2017  . Medication monitoring encounter 04/19/2017  . BPH (benign prostatic hyperplasia) 04/19/2017  . Pain in wrist 01/20/2017  . 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 CAD (coronary artery disease) 03/25/2016  . Abnormal EKG 12/16/2015  . Hyperlipidemia 07/07/2015  . Essential hypertension 07/07/2015  . Hyperglycemia 07/07/2015    Alanson Puls, PT DPT 05/15/2019, 10:31 AM  [place] MAIN [facility] SERVICES [address] [place], [place], [address] Phone: [phone]   Fax:  [phone]  Name: Stephen Stein MRN: [id number] Date of Birth: 1941-09-14

## 2019-05-17 DIAGNOSIS — R42 Dizziness and giddiness: Secondary | ICD-10-CM | POA: Diagnosis not present

## 2019-05-17 DIAGNOSIS — R4189 Other symptoms and signs involving cognitive functions and awareness: Secondary | ICD-10-CM | POA: Diagnosis not present

## 2019-05-17 DIAGNOSIS — R2689 Other abnormalities of gait and mobility: Secondary | ICD-10-CM | POA: Diagnosis not present

## 2019-05-17 DIAGNOSIS — I10 Essential (primary) hypertension: Secondary | ICD-10-CM | POA: Diagnosis not present

## 2019-05-18 ENCOUNTER — Encounter: Payer: Self-pay | Admitting: Family Medicine

## 2019-05-22 ENCOUNTER — Ambulatory Visit: Payer: PPO | Admitting: Physical Therapy

## 2019-05-22 ENCOUNTER — Other Ambulatory Visit: Payer: Self-pay

## 2019-05-22 ENCOUNTER — Encounter: Payer: Self-pay | Admitting: Family Medicine

## 2019-05-22 DIAGNOSIS — R2681 Unsteadiness on feet: Secondary | ICD-10-CM

## 2019-05-22 DIAGNOSIS — G8929 Other chronic pain: Secondary | ICD-10-CM

## 2019-05-22 DIAGNOSIS — M25511 Pain in right shoulder: Secondary | ICD-10-CM

## 2019-05-22 DIAGNOSIS — M6281 Muscle weakness (generalized): Secondary | ICD-10-CM

## 2019-05-22 NOTE — Therapy (Signed)
[place] MAIN [facility] SERVICES [address] [place], [place], [address] Phone: [phone]   Fax:  [phone]  Physical Therapy Treatment  Patient Details  Name: Stephen Stein MRN: [id number] Date of Birth: 04-25-1941 Referring Provider (PT): Enid Derry   Encounter Date: 05/22/2019  PT End of Session - 05/22/19 1002    Visit Number  2    Number of Visits  17    Date for PT Re-Evaluation  07/10/19    PT Start Time  1000    PT Stop Time  1100    PT Time Calculation (min)  60 min    Equipment Utilized During Treatment  Gait belt    Activity Tolerance  Patient tolerated treatment well    Behavior During Therapy  [facility] for tasks assessed/performed       Past Medical History:  Diagnosis Date  . 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 Biliary dyskinesia    a.08/2018 s/p lap chole.  [name] BPH (benign prostatic hyperplasia)   . Carotid atherosclerosis, bilateral 02/20/2019   Feb 2020  . Coronary artery calcification seen on CT scan    a. 11/2015 Cardiac CT: Cor Ca2+ = 892 (80th %'ile). Diffuse Ca2+ in all 3 major coronary arteries; b. 02/2016 MV: HTN response to exercise. Abnl ecg w/ horizontal inflat ST depression, but no ischemia/infarct on imaging; c. 09/2018 MV: EF 64%, no ischemia. Very sm region of mild distal antsept defect, likely 2/2 lbbb.  . Hyperlipidemia   . Hypertension    For irregular heart beat  . Irregular heartbeat/Palpitations    a. Has been taking Atenolol for 20 years  . LBBB (left bundle branch block)   . Left ventricular hypokinesis    a. 09/2018 Echo: EF 45-50%, mod LVH, diff HK. Gr1 DD. Triv AI. Mildly dil Ao root. Mildly reduced RV fxn.  . Lightheadedness   . NSVT (nonsustained ventricular tachycardia) ([place])    a. 11/2018 Zio monitor - 13 beats of NSVT--asymptomatic.  . Orthostatic hypotension    a. 11/2018 pressures improved w/ midodrine/florinef but constant lightheadedness persists.  .  Sinus bradycardia    a. 11/2018 Zio monitor: Avg HR 57 (min 28). Intermittent Mobitz I.    Past Surgical History:  Procedure Laterality Date  . CHOLECYSTECTOMY N/A 09/13/2018   Procedure: LAPAROSCOPIC CHOLECYSTECTOMY;  Surgeon: Jules Husbands, MD;  Location: [facility] ORS;  Service: General;  Laterality: N/A;  . COLONOSCOPY  2008  . ESOPHAGOGASTRODUODENOSCOPY (EGD) WITH PROPOFOL N/A 08/20/2018   Procedure: ESOPHAGOGASTRODUODENOSCOPY (EGD) WITH PROPOFOL;  Surgeon: Lucilla Lame, MD;  Location: [facility] ENDOSCOPY;  Service: Endoscopy;  Laterality: N/A;  . TONSILLECTOMY AND ADENOIDECTOMY  1955    There were no vitals filed for this visit.  Subjective Assessment - 05/22/19 1001    Subjective  Patient reports that he has balance deificts and he falls pretty often.     Pertinent History  Pt reports that he has been sick for the last 6 months. He states that he had his gallbladder out and since then he has been having a lot of fatigue and feels like he is "in a fog." Pt reports that he has tried to stay active but it hasn't been helping. He is complaining of cognitive issues as well with difficulty remembering things that he otherwise was able to remember prior to the surgery. He has been dragging his feet and "walking like an old man." He does complain of  R sided tremor primarily. States that neurologist does not believe that he has Parkinson's. He denies feeling dizzy but feels more "air-headed." Denies any true vertigo. Pt states that he takes [facility] for his enlarged prostate however he still wakes up 4-5 times/night to urinate. He denies any snoring while sleeping. He reports decreased libido and erectile dysfunction since the surgery. Pt reports that his PCP has ordered "all kinds of lab tests" to try to determine the source of his fatigue. He stopped taking BP meds approximately 1 month ago with the consent of his MD. BP has been stable since that time. He started taking Vitamin B12 due to low B12 levels  and they are now improved. He reports approximately 6 falls in the last 6 months. Denies numbness,tingling, or focal weakness.    Limitations  Walking    How long can you walk comfortably?  any distance is uncomfortable    Diagnostic tests  MRI (see history)    Patient Stated Goals  Improve his balance and energy    Currently in Pain?  No/denies    Multiple Pain Sites  No       Ther-ex  Octane x 5 mins L 6  Quantum leg press 100 lbs x 20 x 3 sets Heel raises 100 lbs x 20 x 2 sets    Neuromuscular Re-education   forward lunges to BOSU  alternating LE x 15 each; Airex 6" step taps alternating LE x 10 each; Airex feet together and head turns x 2 mins Airex modified tandem and trunk rotation with UE clasp x 2 mins  TM walking . 8 m/ sec without UE x 5 mins    Pt educated throughout session about proper posture and technique with exercises. Improved exercise technique, movement at target joints, use of target muscles after min to mod verbal, visual, tactile cues.                        PT Education - 05/22/19 1001    Education Details  HEP    Person(s) Educated  Patient    Methods  Explanation    Comprehension  Verbalized understanding       PT Short Term Goals - 05/15/19 1015      PT SHORT TERM GOAL #1   Title  Pt will be independent with HEP in order to improve strength and balance in order to decrease fall risk and improve function at home and work.     Time  4    Period  Weeks    Status  New    Target Date  06/12/19        PT Long Term Goals - 05/15/19 1009      PT LONG TERM GOAL #1   Title  Pt will improve BERG by at least 3 points in order to demonstrate clinically significant improvement in balance.      Time  8    Period  Weeks    Status  New    Target Date  07/10/19      PT LONG TERM GOAL #2   Title  Patient will increase ABC scale score >80% to demonstrate better functional mobility and better confidence with ADLs.     Time  8     Period  Weeks    Status  New    Target Date  07/10/19      PT LONG TERM GOAL #3   Title  Patient will  be independent in home exercise program to improve strength/mobility for better functional independence with ADLs.    Time  8    Period  Weeks    Status  New    Target Date  07/10/19      PT LONG TERM GOAL #4   Title  Pt will be able to balance with eyes closed on foam for at least 15 seconds in order to demonstrate improved balance in low light conditions to decrease risk for falls    Time  8    Period  Weeks    Status  New    Target Date  07/10/19            Plan - 05/22/19 1002    Clinical Impression Statement  Patient required min verbal cueing during side stepping, and required CGA during all dynamic standing balance activities. Patient required occasional rest breaks between exercises due to fatigue. Patient tolerated exercise well. Patient will continue to benefit from skilled therapy in order to improve dynamic standing balance activities and increase gait speed to reduce risk for falls    Rehab Potential  Good    Clinical Impairments Affecting Rehab Potential  chronic condition, arthritis    PT Frequency  2x / week    PT Duration  12 weeks    PT Treatment/Interventions  Cryotherapy;Electrical Stimulation;Ultrasound;Moist Heat;Iontophoresis 4mg /ml Dexamethasone;Therapeutic activities;Therapeutic exercise;Patient/family education;Neuromuscular re-education;Manual techniques;ADLs/Self Care Home Management;Aquatic Therapy;Canalith Repostioning;Traction;DME Instruction;Gait training;Stair training;Functional mobility training;Balance training;Passive range of motion;Dry needling;Energy conservation;Vestibular    PT Next Visit Plan  Initiate HEP, progress strength/balance    PT Home Exercise Plan  To be initiated at next visit    Consulted and Agree with Plan of Care  Patient       Patient will benefit from skilled therapeutic intervention in order to improve the following  deficits and impairments:  Impaired perceived functional ability, Decreased balance  Visit Diagnosis: Unsteadiness on feet  Muscle weakness (generalized)  Chronic left shoulder pain  Chronic right shoulder pain     Problem List Patient Active Problem List   Diagnosis Date Noted  . Carotid atherosclerosis, bilateral 02/20/2019  . Abnormal Holter exam 12/14/2018  . Vitamin B12 deficiency 10/15/2018  . Memory impairment 10/12/2018  . Hx of major depression 10/12/2018  . Biliary dyskinesia   . Protein-calorie malnutrition, severe 08/20/2018  . Gastritis without bleeding   . Duodenitis   . Nausea and vomiting 08/18/2018  . Left bundle branch block 06/25/2018  . Second degree AV block, Mobitz type I 06/25/2018  . Adrenal adenoma 05/09/2017  . Pulmonary nodules 04/19/2017  . Medication monitoring encounter 04/19/2017  . BPH (benign prostatic hyperplasia) 04/19/2017  . Pain in wrist 01/20/2017  . 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 CAD (coronary artery disease) 03/25/2016  . Abnormal EKG 12/16/2015  . Hyperlipidemia 07/07/2015  . Essential hypertension 07/07/2015  . Hyperglycemia 07/07/2015    Alanson Puls , PT DPT 05/22/2019, 10:04 AM  [place] MAIN [facility] SERVICES [address] [place], [place], [address] Phone: [phone]   Fax:  [phone]  Name: Stephen Stein MRN: [id number] Date of Birth: [birth year]

## 2019-05-27 ENCOUNTER — Ambulatory Visit: Payer: Self-pay | Admitting: Cardiovascular Disease

## 2019-05-28 ENCOUNTER — Ambulatory Visit: Payer: PPO | Admitting: Physical Therapy

## 2019-05-28 ENCOUNTER — Other Ambulatory Visit: Payer: Self-pay

## 2019-05-28 ENCOUNTER — Telehealth: Payer: Self-pay | Admitting: Family Medicine

## 2019-05-28 ENCOUNTER — Encounter: Payer: Self-pay | Admitting: Physical Therapy

## 2019-05-28 ENCOUNTER — Ambulatory Visit: Payer: PPO | Attending: Neurology | Admitting: Physical Therapy

## 2019-05-28 DIAGNOSIS — G8929 Other chronic pain: Secondary | ICD-10-CM | POA: Diagnosis not present

## 2019-05-28 DIAGNOSIS — M6281 Muscle weakness (generalized): Secondary | ICD-10-CM | POA: Insufficient documentation

## 2019-05-28 DIAGNOSIS — M25512 Pain in left shoulder: Secondary | ICD-10-CM | POA: Diagnosis not present

## 2019-05-28 DIAGNOSIS — M25511 Pain in right shoulder: Secondary | ICD-10-CM | POA: Diagnosis not present

## 2019-05-28 DIAGNOSIS — R2681 Unsteadiness on feet: Secondary | ICD-10-CM | POA: Insufficient documentation

## 2019-05-28 NOTE — Therapy (Signed)
[place] MAIN [facility] SERVICES [address] [place], [place], [address] Phone: [phone]   Fax:  [phone]  Physical Therapy Treatment  Patient Details  Name: Stephen Stein MRN: [id number] Date of Birth: [birth year] Referring Provider (PT): Enid Derry   Encounter Date: 05/28/2019  PT End of Session - 05/28/19 1008    Visit Number  3    Number of Visits  17    Date for PT Re-Evaluation  07/10/19    PT Start Time  1000    PT Stop Time  1040    PT Time Calculation (min)  40 min    Equipment Utilized During Treatment  Gait belt    Activity Tolerance  Patient tolerated treatment well    Behavior During Therapy  [facility] for tasks assessed/performed       Past Medical History:  Diagnosis Date  . 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 Biliary dyskinesia    a.08/2018 s/p lap chole.  [name] BPH (benign prostatic hyperplasia)   . Carotid atherosclerosis, bilateral 02/20/2019   Feb 2020  . Coronary artery calcification seen on CT scan    a. 11/2015 Cardiac CT: Cor Ca2+ = 892 (80th %'ile). Diffuse Ca2+ in all 3 major coronary arteries; b. 02/2016 MV: HTN response to exercise. Abnl ecg w/ horizontal inflat ST depression, but no ischemia/infarct on imaging; c. 09/2018 MV: EF 64%, no ischemia. Very sm region of mild distal antsept defect, likely 2/2 lbbb.  . Hyperlipidemia   . Hypertension    For irregular heart beat  . Irregular heartbeat/Palpitations    a. Has been taking Atenolol for 20 years  . LBBB (left bundle branch block)   . Left ventricular hypokinesis    a. 09/2018 Echo: EF 45-50%, mod LVH, diff HK. Gr1 DD. Triv AI. Mildly dil Ao root. Mildly reduced RV fxn.  . Lightheadedness   . NSVT (nonsustained ventricular tachycardia) ([place])    a. 11/2018 Zio monitor - 13 beats of NSVT--asymptomatic.  . Orthostatic hypotension    a. 11/2018 pressures improved w/ midodrine/florinef but constant lightheadedness persists.  .  Sinus bradycardia    a. 11/2018 Zio monitor: Avg HR 57 (min 28). Intermittent Mobitz I.    Past Surgical History:  Procedure Laterality Date  . CHOLECYSTECTOMY N/A 09/13/2018   Procedure: LAPAROSCOPIC CHOLECYSTECTOMY;  Surgeon: Jules Husbands, MD;  Location: [facility] ORS;  Service: General;  Laterality: N/A;  . COLONOSCOPY  2008  . ESOPHAGOGASTRODUODENOSCOPY (EGD) WITH PROPOFOL N/A 08/20/2018   Procedure: ESOPHAGOGASTRODUODENOSCOPY (EGD) WITH PROPOFOL;  Surgeon: Lucilla Lame, MD;  Location: [facility] ENDOSCOPY;  Service: Endoscopy;  Laterality: N/A;  . TONSILLECTOMY AND ADENOIDECTOMY  1955    There were no vitals filed for this visit.  Subjective Assessment - 05/28/19 1007    Subjective  Patient reports that he has balance deificts and he falls pretty often. He has shoulder pain that is intermittent,    Pertinent History  Pt reports that he has been sick for the last 6 months. He states that he had his gallbladder out and since then he has been having a lot of fatigue and feels like he is "in a fog." Pt reports that he has tried to stay active but it hasn't been helping. He is complaining of cognitive issues as well with difficulty remembering things that he otherwise was able to remember prior to the surgery. He has been dragging his feet and "walking like an  old man." He does complain of R sided tremor primarily. States that neurologist does not believe that he has Parkinson's. He denies feeling dizzy but feels more "air-headed." Denies any true vertigo. Pt states that he takes [facility] for his enlarged prostate however he still wakes up 4-5 times/night to urinate. He denies any snoring while sleeping. He reports decreased libido and erectile dysfunction since the surgery. Pt reports that his PCP has ordered "all kinds of lab tests" to try to determine the source of his fatigue. He stopped taking BP meds approximately 1 month ago with the consent of his MD. BP has been stable since that time. He started  taking Vitamin B12 due to low B12 levels and they are now improved. He reports approximately 6 falls in the last 6 months. Denies numbness,tingling, or focal weakness.    Limitations  Walking    How long can you walk comfortably?  any distance is uncomfortable    Diagnostic tests  MRI (see history)    Patient Stated Goals  Improve his balance and energy    Currently in Pain?  Yes    Pain Score  7     Pain Location  Shoulder    Pain Orientation  Left    Pain Descriptors / Indicators  Aching    Pain Type  Chronic pain    Pain Onset  More than a month ago    Pain Frequency  Intermittent    Aggravating Factors   actiity       Ther-ex Octane x 5 mins L 6  Quantum leg press 100 lbs x 20 x 3 sets Heel raises 100 lbs x 20 x 2 sets   Neuromuscular Re-education Matrix fwd/bwd/side to side 22. 5 lbs x 3 reps Airex 6" step taps alternating LE x 10 each; Airex feet together and head turns x 2 mins Airex modified tandem and trunk rotation with UE clasp x 2 mins  1/2 foam tandem x 30 sec x 6 sets    Pt educated throughout session about proper posture and technique with exercises. Improved exercise technique, movement at target joints, use of target muscles after min to mod verbal, visual, tactile cues.                        PT Education - 05/28/19 1008    Education Details  HEP    Person(s) Educated  Patient    Methods  Explanation    Comprehension  Verbalized understanding;Need further instruction       PT Short Term Goals - 05/15/19 1015      PT SHORT TERM GOAL #1   Title  Pt will be independent with HEP in order to improve strength and balance in order to decrease fall risk and improve function at home and work.     Time  4    Period  Weeks    Status  New    Target Date  06/12/19        PT Long Term Goals - 05/15/19 1009      PT LONG TERM GOAL #1   Title  Pt will improve BERG by at least 3 points in order to demonstrate clinically significant  improvement in balance.      Time  8    Period  Weeks    Status  New    Target Date  07/10/19      PT LONG TERM GOAL #2   Title  Patient will increase ABC scale score >80% to demonstrate better functional mobility and better confidence with ADLs.     Time  8    Period  Weeks    Status  New    Target Date  07/10/19      PT LONG TERM GOAL #3   Title  Patient will be independent in home exercise program to improve strength/mobility for better functional independence with ADLs.    Time  8    Period  Weeks    Status  New    Target Date  07/10/19      PT LONG TERM GOAL #4   Title  Pt will be able to balance with eyes closed on foam for at least 15 seconds in order to demonstrate improved balance in low light conditions to decrease risk for falls    Time  8    Period  Weeks    Status  New    Target Date  07/10/19            Plan - 05/28/19 1008    Clinical Impression Statement  Patient required min verbal cueing during matrix machine stepping, and required CGA during all dynamic standing balance activities. Patient required occasional rest breaks between exercises due to fatigue. Patient tolerated exercise well. Patient will continue to benefit from skilled therapy in order to improve dynamic standing balance activities and increase gait speed to reduce risk for falls    Rehab Potential  Good    Clinical Impairments Affecting Rehab Potential  chronic condition, arthritis    PT Frequency  2x / week    PT Duration  12 weeks    PT Treatment/Interventions  Cryotherapy;Electrical Stimulation;Ultrasound;Moist Heat;Iontophoresis 4mg /ml Dexamethasone;Therapeutic activities;Therapeutic exercise;Patient/family education;Neuromuscular re-education;Manual techniques;ADLs/Self Care Home Management;Aquatic Therapy;Canalith Repostioning;Traction;DME Instruction;Gait training;Stair training;Functional mobility training;Balance training;Passive range of motion;Dry needling;Energy  conservation;Vestibular    PT Next Visit Plan  Initiate HEP, progress strength/balance    PT Home Exercise Plan  To be initiated at next visit    Consulted and Agree with Plan of Care  Patient       Patient will benefit from skilled therapeutic intervention in order to improve the following deficits and impairments:  Impaired perceived functional ability, Decreased balance  Visit Diagnosis: Unsteadiness on feet  Muscle weakness (generalized)  Chronic left shoulder pain  Chronic right shoulder pain     Problem List Patient Active Problem List   Diagnosis Date Noted  . Carotid atherosclerosis, bilateral 02/20/2019  . Abnormal Holter exam 12/14/2018  . Vitamin B12 deficiency 10/15/2018  . Memory impairment 10/12/2018  . Hx of major depression 10/12/2018  . Biliary dyskinesia   . Protein-calorie malnutrition, severe 08/20/2018  . Gastritis without bleeding   . Duodenitis   . Nausea and vomiting 08/18/2018  . Left bundle branch block 06/25/2018  . Second degree AV block, Mobitz type I 06/25/2018  . Adrenal adenoma 05/09/2017  . Pulmonary nodules 04/19/2017  . Medication monitoring encounter 04/19/2017  . BPH (benign prostatic hyperplasia) 04/19/2017  . Pain in wrist 01/20/2017  . 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 CAD (coronary artery disease) 03/25/2016  . Abnormal EKG 12/16/2015  . Hyperlipidemia 07/07/2015  . Essential hypertension 07/07/2015  . Hyperglycemia 07/07/2015    Alanson Puls, PT DPT 05/28/2019, 10:32 AM  [facility] MAIN [facility] SERVICES [address] [place], [place], [address] Phone: [phone]   Fax:  [phone]  Name: Stephen Stein MRN: [id number] Date of Birth: [birth year]

## 2019-05-28 NOTE — Telephone Encounter (Signed)
@  [place] Chronic Care Management   Outreach Note  05/28/2019 Name: Stephen Stein MRN: [id number] DOB: 1941/11/08  Referred by: Arnetha Courser, MD Reason for referral : Chronic Care Management (Initial CCM outreach call was unsuccessful)   An unsuccessful telephone outreach was attempted today. The patient was referred to the case management team by for assistance with chronic care management and care coordination.   Follow Up Plan: The care management team will reach out to the patient again over the next 7 days.   [place]  [email or web address]   ??[phone]

## 2019-05-30 ENCOUNTER — Ambulatory Visit: Payer: PPO | Admitting: Physical Therapy

## 2019-06-04 ENCOUNTER — Ambulatory Visit: Payer: PPO | Admitting: Physical Therapy

## 2019-06-04 ENCOUNTER — Telehealth: Payer: Self-pay | Admitting: Family Medicine

## 2019-06-04 NOTE — Chronic Care Management (AMB) (Signed)
Chronic Care Management   Note  06/04/2019 Name: [name] MRN: [id number] DOB: 1941/01/25  Stephen Stein is a 78 y.o. year old male who is a primary care patient of Lada, Satira Anis, MD. I reached out to Stephen Stein by phone today in response to a referral sent by [name]'s health plan.    [name] was given information about Chronic Care Management services today including:  1. CCM service includes personalized support from designated clinical staff supervised by his physician, including individualized plan of care and coordination with other care providers 2. 24/7 contact phone numbers for assistance for urgent and routine care needs. 3. Service will only be billed when office clinical staff spend 20 minutes or more in a month to coordinate care. 4. Only one practitioner may furnish and bill the service in a calendar month. 5. The patient may stop CCM services at any time (effective at the end of the month) by phone call to the office staff. 6. The patient will be responsible for cost sharing (co-pay) of up to 20% of the service fee (after annual deductible is met).  Patient did not agree to enrollment in care management services and does not wish to consider at this time.  Follow up plan: The patient has been provided with contact information for the chronic care management team and has been advised to call with any health related questions or concerns.   [place]  [email or web address]   ??[phone]

## 2019-06-06 ENCOUNTER — Ambulatory Visit: Payer: PPO | Admitting: Physical Therapy

## 2019-06-07 ENCOUNTER — Other Ambulatory Visit: Payer: Self-pay | Admitting: Family Medicine

## 2019-06-08 NOTE — Telephone Encounter (Signed)
Please schedule in the next 1-2 months for routine follow up.

## 2019-06-10 NOTE — Telephone Encounter (Signed)
lvm informing pt that prescription has been sent in to his pharmacy and asked him to return call to schedule an appt within the next month or two

## 2019-06-11 ENCOUNTER — Ambulatory Visit: Payer: PPO | Admitting: Physical Therapy

## 2019-06-13 ENCOUNTER — Ambulatory Visit: Payer: PPO | Admitting: Physical Therapy

## 2019-06-13 ENCOUNTER — Ambulatory Visit: Payer: PPO

## 2019-06-18 ENCOUNTER — Ambulatory Visit: Payer: PPO | Admitting: Physical Therapy

## 2019-06-20 ENCOUNTER — Ambulatory Visit: Payer: PPO | Admitting: Physical Therapy

## 2019-06-25 ENCOUNTER — Other Ambulatory Visit: Payer: Self-pay

## 2019-06-25 ENCOUNTER — Ambulatory Visit (INDEPENDENT_AMBULATORY_CARE_PROVIDER_SITE_OTHER): Payer: PPO | Admitting: Nurse Practitioner

## 2019-06-25 ENCOUNTER — Encounter: Payer: Self-pay | Admitting: Nurse Practitioner

## 2019-06-25 ENCOUNTER — Ambulatory Visit: Payer: PPO | Admitting: Physical Therapy

## 2019-06-25 VITALS — BP 118/70 | HR 68 | Temp 97.3°F | Resp 14 | Ht 70.0 in | Wt 189.3 lb

## 2019-06-25 DIAGNOSIS — G8929 Other chronic pain: Secondary | ICD-10-CM

## 2019-06-25 DIAGNOSIS — R413 Other amnesia: Secondary | ICD-10-CM

## 2019-06-25 DIAGNOSIS — M25512 Pain in left shoulder: Secondary | ICD-10-CM | POA: Diagnosis not present

## 2019-06-25 DIAGNOSIS — M19011 Primary osteoarthritis, right shoulder: Secondary | ICD-10-CM

## 2019-06-25 DIAGNOSIS — M25511 Pain in right shoulder: Secondary | ICD-10-CM | POA: Diagnosis not present

## 2019-06-25 DIAGNOSIS — I25118 Atherosclerotic heart disease of native coronary artery with other forms of angina pectoris: Secondary | ICD-10-CM

## 2019-06-25 DIAGNOSIS — I441 Atrioventricular block, second degree: Secondary | ICD-10-CM

## 2019-06-25 DIAGNOSIS — M19012 Primary osteoarthritis, left shoulder: Secondary | ICD-10-CM | POA: Diagnosis not present

## 2019-06-25 DIAGNOSIS — R42 Dizziness and giddiness: Secondary | ICD-10-CM

## 2019-06-25 DIAGNOSIS — E43 Unspecified severe protein-calorie malnutrition: Secondary | ICD-10-CM

## 2019-06-25 DIAGNOSIS — I700 Atherosclerosis of aorta: Secondary | ICD-10-CM | POA: Diagnosis not present

## 2019-06-25 NOTE — Progress Notes (Signed)
Name: Stephen Stein   MRN: [id number]    DOB: December 01, 1941   Date:06/25/2019       Progress Note  Subjective  Chief Complaint  Chief Complaint  Patient presents with  . Shoulder Pain    Bilateral    HPI  Patient endorses bilateral shoulder pain that is chronic and has been progressively worsening. States he hears clicking when he rotates shoulders. Pain is constant, takes 2 acetaminophen in the morning to help- has taken up to 6 pills a day. States he took meloxicam once in the last month and it worked about the same as acetaminophen did. Has limited ROM. He additionally takes glucoasmine-chondroitin OTC.  Hyperlipidemia Patient rx rosuvastatin 10mg  daily. Takes medications as prescribed with no missed doses a month.  Denies myalgias Lab Results  Component Value Date   CHOL 95 08/07/2018   HDL 33 (L) 08/07/2018   LDLCALC 45 08/07/2018   TRIG 90 08/07/2018   CHOLHDL 2.9 08/07/2018   Patient sees cardiologist for CAD with stable angina, second degree AV block.  Echocardiogram October 09, 2018: Ejection fraction 45 to 50%, moderate LVH Stress test October 17, 2018: No significant ischemia,fraction estimated 65% CT coronary calcium score,800: He has three-vessel coronary artery disease, no significant aortic atherosclerosis  Sees neurologist Dr. Manuella Ghazi and [place] PA-C for memory impairment and chronic dizziness. Dizziness still present but he has learned to manage with it through physical therapy. He is taking Aricept daily as prescribed, main issue is with short-term memory.   PHQ2/9: Depression screen [facility] 2/9 06/25/2019 02/04/2019 12/14/2018 10/12/2018 09/05/2018  Decreased Interest 0 0 0 0 0  Down, Depressed, Hopeless 0 0 0 0 1  PHQ - 2 Score 0 0 0 0 1  Altered sleeping 0 0 0 - 0  Tired, decreased energy 0 0 0 - 3  Change in appetite 0 0 0 - 3  Feeling bad or failure about yourself  0 0 0 - 1  Trouble concentrating 0 0 0 - 1  Moving slowly or fidgety/restless 0 0 0 - 1  Suicidal  thoughts 0 0 0 - 0  PHQ-9 Score 0 0 0 - 10  Difficult doing work/chores Not difficult at all Not difficult at all Not difficult at all - Somewhat difficult     PHQ reviewed. Negative  Patient Active Problem List   Diagnosis Date Noted  . Carotid atherosclerosis, bilateral 02/20/2019  . Abnormal Holter exam 12/14/2018  . Vitamin B12 deficiency 10/15/2018  . Memory impairment 10/12/2018  . Hx of major depression 10/12/2018  . Biliary dyskinesia   . Protein-calorie malnutrition, severe 08/20/2018  . Gastritis without bleeding   . Duodenitis   . Nausea and vomiting 08/18/2018  . Left bundle branch block 06/25/2018  . Second degree AV block, Mobitz type I 06/25/2018  . Adrenal adenoma 05/09/2017  . Pulmonary nodules 04/19/2017  . Medication monitoring encounter 04/19/2017  . BPH (benign prostatic hyperplasia) 04/19/2017  . Pain in wrist 01/20/2017  . 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 CAD (coronary artery disease) 03/25/2016  . Abnormal EKG 12/16/2015  . Hyperlipidemia 07/07/2015  . Essential hypertension 07/07/2015  . Hyperglycemia 07/07/2015    Past Medical History:  Diagnosis Date  . 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 Biliary dyskinesia    a.08/2018 s/p lap chole.  [name] BPH (benign prostatic hyperplasia)   . Carotid atherosclerosis, bilateral 02/20/2019   Feb 2020  . Coronary artery calcification  seen on CT scan    a. 11/2015 Cardiac CT: Cor Ca2+ = 892 (80th %'ile). Diffuse Ca2+ in all 3 major coronary arteries; b. 02/2016 MV: HTN response to exercise. Abnl ecg w/ horizontal inflat ST depression, but no ischemia/infarct on imaging; c. 09/2018 MV: EF 64%, no ischemia. Very sm region of mild distal antsept defect, likely 2/2 lbbb.  . Hyperlipidemia   . Hypertension    For irregular heart beat  . Irregular heartbeat/Palpitations    a. Has been taking Atenolol for 20 years  . LBBB (left bundle branch block)   . Left ventricular  hypokinesis    a. 09/2018 Echo: EF 45-50%, mod LVH, diff HK. Gr1 DD. Triv AI. Mildly dil Ao root. Mildly reduced RV fxn.  . Lightheadedness   . NSVT (nonsustained ventricular tachycardia) ([place])    a. 11/2018 Zio monitor - 13 beats of NSVT--asymptomatic.  . Orthostatic hypotension    a. 11/2018 pressures improved w/ midodrine/florinef but constant lightheadedness persists.  . Sinus bradycardia    a. 11/2018 Zio monitor: Avg HR 57 (min 28). Intermittent Mobitz I.    Past Surgical History:  Procedure Laterality Date  . CHOLECYSTECTOMY N/A 09/13/2018   Procedure: LAPAROSCOPIC CHOLECYSTECTOMY;  Surgeon: Jules Husbands, MD;  Location: [facility] ORS;  Service: General;  Laterality: N/A;  . COLONOSCOPY  2008  . ESOPHAGOGASTRODUODENOSCOPY (EGD) WITH PROPOFOL N/A 08/20/2018   Procedure: ESOPHAGOGASTRODUODENOSCOPY (EGD) WITH PROPOFOL;  Surgeon: Lucilla Lame, MD;  Location: [facility] ENDOSCOPY;  Service: Endoscopy;  Laterality: N/A;  . TONSILLECTOMY AND ADENOIDECTOMY  1955    Social History   Tobacco Use  . Smoking status: Former Smoker    Quit date: 06/22/1992    Years since quitting: 27.0  . Smokeless tobacco: Former Systems developer    Quit date: 06/22/1992  Substance Use Topics  . Alcohol use: Yes    Alcohol/week: 1.0 standard drinks    Types: 1 Cans of beer per week    Comment: occasional     Current Outpatient Medications:  .  aspirin 81 MG chewable tablet, Chew 81 mg by mouth daily., Disp: , Rfl:  .  clonazePAM (KLONOPIN) 0.5 MG tablet, Take 0.5 tablets (0.25 mg total) by mouth 2 (two) times daily as needed (dizziness). 50 tabs to last 90 days, Disp: 50 tablet, Rfl: 0 .  Cyanocobalamin (VITAMIN B-12 PO), Take 1 tablet by mouth once daily, Disp: , Rfl:  .  donepezil (ARICEPT) 5 MG tablet, Take 5 mg by mouth daily. , Disp: , Rfl:  .  meloxicam (MOBIC) 7.5 MG tablet, Take 0.5-1 tablets (3.75-7.5 mg total) by mouth daily as needed for pain., Disp: , Rfl:  .  rosuvastatin (CRESTOR) 10 MG tablet, TAKE 1 TABLET  BY MOUTH EVERY DAY, Disp: 90 tablet, Rfl: 1 .  saw palmetto 160 MG capsule, Take 160 mg by mouth 2 (two) times daily., Disp: , Rfl:  .  sertraline (ZOLOFT) 50 MG tablet, Take 25 mg by mouth daily. Take 25 mg one tablet daily for 2 weeks.  Increase 25 mg to 50 mg one tablet daily thereafter., Disp: , Rfl:   No Known Allergies  ROS    No other specific complaints in a complete review of systems (except as listed in HPI above).  Objective  Vitals:   06/25/19 1123  BP: 118/70  Pulse: 68  Resp: 14  Temp: (!) 97.3 F (36.3 C)  TempSrc: Tympanic  SpO2: 99%  Weight: 189 lb 4.8 oz (85.9 kg)  Height: 5\' 10"  (1.778 m)  Body mass index is 27.16 kg/m.  Nursing Note and Vital Signs reviewed.  Physical Exam Constitutional:      Appearance: Normal appearance. He is well-developed.  HENT:     Head: Normocephalic and atraumatic.     Right Ear: Hearing normal.     Left Ear: Hearing normal.  Eyes:     Conjunctiva/sclera: Conjunctivae normal.  Cardiovascular:     Rate and Rhythm: Normal rate.  Pulmonary:     Effort: Pulmonary effort is normal.  Musculoskeletal:     Right shoulder: He exhibits decreased range of motion, crepitus and pain. He exhibits no tenderness, no swelling and no deformity.     Left shoulder: He exhibits decreased range of motion, crepitus and pain. He exhibits no tenderness, no effusion, no deformity and no spasm.  Neurological:     Mental Status: He is alert and oriented to person, place, and time.  Psychiatric:        Speech: Speech normal.        Behavior: Behavior normal. Behavior is cooperative.        Thought Content: Thought content normal.        Judgment: Judgment normal.        No results found for this or any previous visit (from the past 48 hour(s)).  Assessment & Plan 1. Chronic pain of both shoulders Use acetaminophen 1000mg  every 8 hours as needed for pain (no more than 3000mg  daily). Can taking meloxicam in addition to acetaminophen  if needed for severe pain. - Ambulatory referral to Orthopedic Surgery  2. Primary osteoarthritis of left shoulder Use acetaminophen 1000mg  every 8 hours as needed for pain (no more than 3000mg  daily). Can taking meloxicam in addition to acetaminophen if needed for severe pain. - Ambulatory referral to Orthopedic Surgery  3. Primary osteoarthritis of right shoulder Use acetaminophen 1000mg  every 8 hours as needed for pain (no more than 3000mg  daily). Can taking meloxicam in addition to acetaminophen if needed for severe pain. - Ambulatory referral to Orthopedic Surgery  4. Protein-calorie malnutrition, severe ([place]) Resolved- will reorder blood work at follow-up per patient request  5. Aortic calcification (HCC) Continue crestor   6. Second degree AV block, Mobitz type I Stable follow-up with cards  7. Coronary artery disease of native artery of native heart with stable angina pectoris ([place]) Stable follow-up with cards 8. Dizziness Chronic, stable, follow-up with PT and neurology   9. Memory impairment Stable, continue Aricept, follow-up with neurology

## 2019-06-25 NOTE — Patient Instructions (Addendum)
- Use acetaminophen 1000mg  every 8 hours as needed for pain (no more than 3000mg  daily). Can taking meloxicam in addition to acetaminophen if needed for severe pain. - You should hear from orthopedic shortly  Osteoarthritis Osteoarthritis is a type of arthritis that affects tissue that covers the ends of bones in joints (cartilage). Cartilage acts as a cushion between the bones and helps them move smoothly. Osteoarthritis results when cartilage in the joints gets worn down. Osteoarthritis is sometimes called "wear and tear" arthritis. Osteoarthritis is the most common form of arthritis. It often occurs in older people. It is a condition that gets worse over time (a progressive condition). Joints that are most often affected by this condition are in:  Fingers.  Toes.  Hips.  Knees.  Spine, including neck and lower back. What are the causes? This condition is caused by age-related wearing down of cartilage that covers the ends of bones. What increases the risk? The following factors may make you more likely to develop this condition:  Older age.  Being overweight or obese.  Overuse of joints, such as in athletes.  Past injury of a joint.  Past surgery on a joint.  Family history of osteoarthritis. What are the signs or symptoms? The main symptoms of this condition are pain, swelling, and stiffness in the joint. The joint may lose its shape over time. Small pieces of bone or cartilage may break off and float inside of the joint, which may cause more pain and damage to the joint. Small deposits of bone (osteophytes) may grow on the edges of the joint. Other symptoms may include:  A grating or scraping feeling inside the joint when you move it.  Popping or creaking sounds when you move. Symptoms may affect one or more joints. Osteoarthritis in a major joint, such as your knee or hip, can make it painful to walk or exercise. If you have osteoarthritis in your hands, you might not be  able to grip items, twist your hand, or control small movements of your hands and fingers (fine motor skills). How is this diagnosed? This condition may be diagnosed based on:  Your medical history.  A physical exam.  Your symptoms.  X-rays of the affected joint(s).  Blood tests to rule out other types of arthritis. How is this treated? There is no cure for this condition, but treatment can help to control pain and improve joint function. Treatment plans may include:  A prescribed exercise program that allows for rest and joint relief. You may work with a physical therapist.  A weight control plan.  Pain relief techniques, such as: ? Applying heat and cold to the joint. ? Electric pulses delivered to nerve endings under the skin (transcutaneous electrical nerve stimulation, or TENS). ? Massage. ? Certain nutritional supplements.  NSAIDs or prescription medicines to help relieve pain.  Medicine to help relieve pain and inflammation (corticosteroids). This can be given by mouth (orally) or as an injection.  Assistive devices, such as a brace, wrap, splint, specialized glove, or cane.  Surgery, such as: ? An osteotomy. This is done to reposition the bones and relieve pain or to remove loose pieces of bone and cartilage. ? Joint replacement surgery. You may need this surgery if you have very bad (advanced) osteoarthritis. Follow these instructions at home: Activity  Rest your affected joints as directed by your health care provider.  Do not drive or use heavy machinery while taking prescription pain medicine.  Exercise as directed. Your health  care provider or physical therapist may recommend specific types of exercise, such as: ? Strengthening exercises. These are done to strengthen the muscles that support joints that are affected by arthritis. They can be performed with weights or with exercise bands to add resistance. ? Aerobic activities. These are exercises, such as  brisk walking or water aerobics, that get your heart pumping. ? Range-of-motion activities. These keep your joints easy to move. ? Balance and agility exercises. Managing pain, stiffness, and swelling   If directed, apply heat to the affected area as often as told by your health care provider. Use the heat source that your health care provider recommends, such as a moist heat pack or a heating pad. ? If you have a removable assistive device, remove it as told by your health care provider. ? Place a towel between your skin and the heat source. If your health care provider tells you to keep the assistive device on while you apply heat, place a towel between the assistive device and the heat source. ? Leave the heat on for 20-30 minutes. ? Remove the heat if your skin turns bright red. This is especially important if you are unable to feel pain, heat, or cold. You may have a greater risk of getting burned.  If directed, put ice on the affected joint: ? If you have a removable assistive device, remove it as told by your health care provider. ? Put ice in a plastic bag. ? Place a towel between your skin and the bag. If your health care provider tells you to keep the assistive device on during icing, place a towel between the assistive device and the bag. ? Leave the ice on for 20 minutes, 2-3 times a day. General instructions  Take over-the-counter and prescription medicines only as told by your health care provider.  Maintain a healthy weight. Follow instructions from your health care provider for weight control. These may include dietary restrictions.  Do not use any products that contain nicotine or tobacco, such as cigarettes and e-cigarettes. These can delay bone healing. If you need help quitting, ask your health care provider.  Use assistive devices as directed by your health care provider.  Keep all follow-up visits as told by your health care provider. This is important. Where to find  more information  [employer] of Arthritis and Musculoskeletal and Skin Diseases: www.niams.SouthEx[employer].es  [employer] on Aging: http://kim-miller.com/  American College of Rheumatology: www.rheumatology.org Contact a health care provider if:  Your skin turns red.  You develop a rash.  You have pain that gets worse.  You have a fever along with joint or muscle aches. Get help right away if:  You lose a lot of weight.  You suddenly lose your appetite.  You have night sweats. Summary  Osteoarthritis is a type of arthritis that affects tissue covering the ends of bones in joints (cartilage).  This condition is caused by age-related wearing down of cartilage that covers the ends of bones.  The main symptom of this condition is pain, swelling, and stiffness in the joint.  There is no cure for this condition, but treatment can help to control pain and improve joint function. This information is not intended to replace advice given to you by your health care provider. Make sure you discuss any questions you have with your health care provider. Document Released: 12/12/2005 Document Revised: 11/24/2017 Document Reviewed: 08/15/2016 Elsevier Patient Education  2020 [employer].

## 2019-06-27 ENCOUNTER — Ambulatory Visit: Payer: PPO | Admitting: Physical Therapy

## 2019-07-02 ENCOUNTER — Ambulatory Visit: Payer: PPO | Admitting: Physical Therapy

## 2019-07-02 DIAGNOSIS — M19011 Primary osteoarthritis, right shoulder: Secondary | ICD-10-CM | POA: Diagnosis not present

## 2019-07-02 DIAGNOSIS — M19012 Primary osteoarthritis, left shoulder: Secondary | ICD-10-CM | POA: Diagnosis not present

## 2019-07-04 ENCOUNTER — Other Ambulatory Visit: Payer: Self-pay

## 2019-07-04 ENCOUNTER — Ambulatory Visit (INDEPENDENT_AMBULATORY_CARE_PROVIDER_SITE_OTHER): Payer: PPO

## 2019-07-04 ENCOUNTER — Ambulatory Visit: Payer: PPO | Admitting: Physical Therapy

## 2019-07-04 VITALS — BP 112/60 | HR 69 | Temp 97.3°F | Resp 16 | Ht 70.0 in | Wt 190.0 lb

## 2019-07-04 DIAGNOSIS — Z0000 Encounter for general adult medical examination without abnormal findings: Secondary | ICD-10-CM | POA: Diagnosis not present

## 2019-07-04 NOTE — Patient Instructions (Signed)
Stephen Stein , Thank you for taking time to come for your Medicare Wellness Visit. I appreciate your ongoing commitment to your health goals. Please review the following plan we discussed and let me know if I can assist you in the future.   Screening recommendations/referrals: Colonoscopy: done 07/06/12 Recommended yearly ophthalmology/optometry visit for glaucoma screening and checkup Recommended yearly dental visit for hygiene and checkup  Vaccinations: Influenza vaccine: done 09/24/18 Pneumococcal vaccine: done 09/24/18 Tdap vaccine: done 06/21/11 Shingles vaccine: Shingrix discussed. Please contact your pharmacy for coverage information.   Conditions/risks identified: recommend continuing physical therapy and strengthening exercises to prevent falls  Next appointment: Please follow up in one year for your Medicare Annual Wellness visit.    Preventive Care 77 Years and Older, Male Preventive care refers to lifestyle choices and visits with your health care provider that can promote health and wellness. What does preventive care include?  A yearly physical exam. This is also called an annual well check.  Dental exams once or twice a year.  Routine eye exams. Ask your health care provider how often you should have your eyes checked.  Personal lifestyle choices, including:  Daily care of your teeth and gums.  Regular physical activity.  Eating a healthy diet.  Avoiding tobacco and drug use.  Limiting alcohol use.  Practicing safe sex.  Taking low doses of aspirin every day.  Taking vitamin and mineral supplements as recommended by your health care provider. What happens during an annual well check? The services and screenings done by your health care provider during your annual well check will depend on your age, overall health, lifestyle risk factors, and family history of disease. Counseling  Your health care provider may ask you questions about your:  Alcohol use.   Tobacco use.  Drug use.  Emotional well-being.  Home and relationship well-being.  Sexual activity.  Eating habits.  History of falls.  Memory and ability to understand (cognition).  Work and work Statistician. Screening  You may have the following tests or measurements:  Height, weight, and BMI.  Blood pressure.  Lipid and cholesterol levels. These may be checked every 5 years, or more frequently if you are over 23 years old.  Skin check.  Lung cancer screening. You may have this screening every year starting at age 55 if you have a 30-pack-year history of smoking and currently smoke or have quit within the past 15 years.  Fecal occult blood test (FOBT) of the stool. You may have this test every year starting at age 84.  Flexible sigmoidoscopy or colonoscopy. You may have a sigmoidoscopy every 5 years or a colonoscopy every 10 years starting at age 60.  Prostate cancer screening. Recommendations will vary depending on your family history and other risks.  Hepatitis C blood test.  Hepatitis B blood test.  Sexually transmitted disease (STD) testing.  Diabetes screening. This is done by checking your blood sugar (glucose) after you have not eaten for a while (fasting). You may have this done every 1-3 years.  Abdominal aortic aneurysm (AAA) screening. You may need this if you are a current or former smoker.  Osteoporosis. You may be screened starting at age 75 if you are at high risk. Talk with your health care provider about your test results, treatment options, and if necessary, the need for more tests. Vaccines  Your health care provider may recommend certain vaccines, such as:  Influenza vaccine. This is recommended every year.  Tetanus, diphtheria, and acellular pertussis (Tdap,  Td) vaccine. You may need a Td booster every 10 years.  Zoster vaccine. You may need this after age 77.  Pneumococcal 13-valent conjugate (PCV13) vaccine. One dose is recommended  after age 77.  Pneumococcal polysaccharide (PPSV23) vaccine. One dose is recommended after age 77. Talk to your health care provider about which screenings and vaccines you need and how often you need them. This information is not intended to replace advice given to you by your health care provider. Make sure you discuss any questions you have with your health care provider. Document Released: 01/08/2016 Document Revised: 08/31/2016 Document Reviewed: 10/13/2015 Elsevier Interactive Patient Education  2017 [place] Prevention in the Home Falls can cause injuries. They can happen to people of all ages. There are many things you can do to make your home safe and to help prevent falls. What can I do on the outside of my home?  Regularly fix the edges of walkways and driveways and fix any cracks.  Remove anything that might make you trip as you walk through a door, such as a raised step or threshold.  Trim any bushes or trees on the path to your home.  Use bright outdoor lighting.  Clear any walking paths of anything that might make someone trip, such as rocks or tools.  Regularly check to see if handrails are loose or broken. Make sure that both sides of any steps have handrails.  Any raised decks and porches should have guardrails on the edges.  Have any leaves, snow, or ice cleared regularly.  Use sand or salt on walking paths during winter.  Clean up any spills in your garage right away. This includes oil or grease spills. What can I do in the bathroom?  Use night lights.  Install grab bars by the toilet and in the tub and shower. Do not use towel bars as grab bars.  Use non-skid mats or decals in the tub or shower.  If you need to sit down in the shower, use a plastic, non-slip stool.  Keep the floor dry. Clean up any water that spills on the floor as soon as it happens.  Remove soap buildup in the tub or shower regularly.  Attach bath mats securely with  double-sided non-slip rug tape.  Do not have throw rugs and other things on the floor that can make you trip. What can I do in the bedroom?  Use night lights.  Make sure that you have a light by your bed that is easy to reach.  Do not use any sheets or blankets that are too big for your bed. They should not hang down onto the floor.  Have a firm chair that has side arms. You can use this for support while you get dressed.  Do not have throw rugs and other things on the floor that can make you trip. What can I do in the kitchen?  Clean up any spills right away.  Avoid walking on wet floors.  Keep items that you use a lot in easy-to-reach places.  If you need to reach something above you, use a strong step stool that has a grab bar.  Keep electrical cords out of the way.  Do not use floor polish or wax that makes floors slippery. If you must use wax, use non-skid floor wax.  Do not have throw rugs and other things on the floor that can make you trip. What can I do with my stairs?  Do not  leave any items on the stairs.  Make sure that there are handrails on both sides of the stairs and use them. Fix handrails that are broken or loose. Make sure that handrails are as long as the stairways.  Check any carpeting to make sure that it is firmly attached to the stairs. Fix any carpet that is loose or worn.  Avoid having throw rugs at the top or bottom of the stairs. If you do have throw rugs, attach them to the floor with carpet tape.  Make sure that you have a light switch at the top of the stairs and the bottom of the stairs. If you do not have them, ask someone to add them for you. What else can I do to help prevent falls?  Wear shoes that:  Do not have high heels.  Have rubber bottoms.  Are comfortable and fit you well.  Are closed at the toe. Do not wear sandals.  If you use a stepladder:  Make sure that it is fully opened. Do not climb a closed stepladder.  Make  sure that both sides of the stepladder are locked into place.  Ask someone to hold it for you, if possible.  Clearly mark and make sure that you can see:  Any grab bars or handrails.  First and last steps.  Where the edge of each step is.  Use tools that help you move around (mobility aids) if they are needed. These include:  Canes.  Walkers.  Scooters.  Crutches.  Turn on the lights when you go into a dark area. Replace any light bulbs as soon as they burn out.  Set up your furniture so you have a clear path. Avoid moving your furniture around.  If any of your floors are uneven, fix them.  If there are any pets around you, be aware of where they are.  Review your medicines with your doctor. Some medicines can make you feel dizzy. This can increase your chance of falling. Ask your doctor what other things that you can do to help prevent falls. This information is not intended to replace advice given to you by your health care provider. Make sure you discuss any questions you have with your health care provider. Document Released: 10/08/2009 Document Revised: 05/19/2016 Document Reviewed: 01/16/2015 Elsevier Interactive Patient Education  2017 [employer].

## 2019-07-04 NOTE — Progress Notes (Signed)
Subjective:   Stephen Stein is a 78 y.o. male who presents for an Initial Medicare Annual Wellness Visit.  Review of Systems   Cardiac Risk Factors include: advanced age (>13men, >67 women);dyslipidemia;male gender    Objective:    Today's Vitals   07/04/19 0823  BP: 112/60  Pulse: 69  Resp: 16  Temp: (!) 97.3 F (36.3 C)  TempSrc: Oral  SpO2: 97%  Weight: 190 lb (86.2 kg)  Height: 5\' 10"  (1.778 m)   Body mass index is 27.26 kg/m.  Advanced Directives 07/04/2019 05/15/2019 02/14/2019 01/29/2019 09/13/2018 09/01/2018 08/20/2018  Does Patient Have a Medical Advance Directive? Yes Yes Yes Yes Yes Yes Yes  Type of Paramedic of [place];Living will - - - Special educational needs teacher of [place];Living will -  Does patient want to make changes to medical advance directive? - - - - No - Patient declined - -  Copy of [place] in Chart? Yes - validated most recent copy scanned in chart (See row information) - - - No - copy requested - -  Would patient like information on creating a medical advance directive? - - - - - - -    Current Medications (verified) Outpatient Encounter Medications as of 07/04/2019  Medication Sig  . aspirin 81 MG chewable tablet Chew 81 mg by mouth daily.  . clonazePAM (KLONOPIN) 0.5 MG tablet Take 0.5 tablets (0.25 mg total) by mouth 2 (two) times daily as needed (dizziness). 50 tabs to last 90 days  . Cyanocobalamin (VITAMIN B-12 PO) Take 1 tablet by mouth once daily  . donepezil (ARICEPT) 5 MG tablet Take 5 mg by mouth daily.   [name] glucosamine-chondroitin 500-400 MG tablet Take 1 tablet by mouth daily.  . meloxicam (MOBIC) 7.5 MG tablet Take 0.5-1 tablets (3.75-7.5 mg total) by mouth daily as needed for pain. (Patient taking differently: Take 3.75-7.5 mg by mouth daily as needed for pain. Pt takes very rarely)  . rosuvastatin (CRESTOR) 10 MG tablet TAKE 1 TABLET BY MOUTH EVERY DAY  . saw palmetto 160 MG  capsule Take 160 mg by mouth 2 (two) times daily.  . sertraline (ZOLOFT) 50 MG tablet Take 25 mg by mouth daily. Take 25 mg one tablet daily for 2 weeks.  Increase 25 mg to 50 mg one tablet daily thereafter.   No facility-administered encounter medications on file as of 07/04/2019.     Allergies (verified) Patient has no known allergies.   History: Past Medical History:  Diagnosis Date  . 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 Biliary dyskinesia    a.08/2018 s/p lap chole.  [name] BPH (benign prostatic hyperplasia)   . Carotid atherosclerosis, bilateral 02/20/2019   Feb 2020  . Coronary artery calcification seen on CT scan    a. 11/2015 Cardiac CT: Cor Ca2+ = 892 (80th %'ile). Diffuse Ca2+ in all 3 major coronary arteries; b. 02/2016 MV: HTN response to exercise. Abnl ecg w/ horizontal inflat ST depression, but no ischemia/infarct on imaging; c. 09/2018 MV: EF 64%, no ischemia. Very sm region of mild distal antsept defect, likely 2/2 lbbb.  . Hyperlipidemia   . Hypertension    For irregular heart beat  . Irregular heartbeat/Palpitations    a. Has been taking Atenolol for 20 years  . LBBB (left bundle branch block)   . Left ventricular hypokinesis    a. 09/2018 Echo: EF 45-50%, mod LVH, diff  HK. Gr1 DD. Triv AI. Mildly dil Ao root. Mildly reduced RV fxn.  . Lightheadedness   . NSVT (nonsustained ventricular tachycardia) ([place])    a. 11/2018 Zio monitor - 13 beats of NSVT--asymptomatic.  . Orthostatic hypotension    a. 11/2018 pressures improved w/ midodrine/florinef but constant lightheadedness persists.  . Sinus bradycardia    a. 11/2018 Zio monitor: Avg HR 57 (min 28). Intermittent Mobitz I.   Past Surgical History:  Procedure Laterality Date  . CHOLECYSTECTOMY N/A 09/13/2018   Procedure: LAPAROSCOPIC CHOLECYSTECTOMY;  Surgeon: Jules Husbands, MD;  Location: [facility] ORS;  Service: General;  Laterality: N/A;  . COLONOSCOPY  2008  . ESOPHAGOGASTRODUODENOSCOPY  (EGD) WITH PROPOFOL N/A 08/20/2018   Procedure: ESOPHAGOGASTRODUODENOSCOPY (EGD) WITH PROPOFOL;  Surgeon: Lucilla Lame, MD;  Location: [facility] ENDOSCOPY;  Service: Endoscopy;  Laterality: N/A;  . TONSILLECTOMY AND ADENOIDECTOMY  1955   Family History  Problem Relation Age of Onset  . Cancer Mother        lung  . Heart disease Father   . Hearing loss Sister        coclear implant  . Hearing loss Brother   . Cancer Paternal Grandmother   . Cancer Son        colon cancer  . Colon cancer Neg Hx   . Stomach cancer Neg Hx    Social History   Socioeconomic History  . Marital status: Married    Spouse name: Not on file  . Number of children: 2  . Years of education: Not on file  . Highest education level: Bachelor's degree (e.g., BA, AB, BS)  Occupational History  . Not on file  Social Needs  . Financial resource strain: Not hard at all  . Food insecurity    Worry: Never true    Inability: Never true  . Transportation needs    Medical: No    Non-medical: No  Tobacco Use  . Smoking status: Former Smoker    Quit date: 06/22/1992    Years since quitting: 27.0  . Smokeless tobacco: Former Systems developer    Quit date: 06/22/1992  Substance and Sexual Activity  . Alcohol use: Yes    Alcohol/week: 1.0 standard drinks    Types: 1 Cans of beer per week    Comment: occasional  . Drug use: No  . Sexual activity: Not Currently  Lifestyle  . Physical activity    Days per week: 5 days    Minutes per session: 30 min  . Stress: Only a little  Relationships  . Social connections    Talks on phone: More than three times a week    Gets together: Three times a week    Attends religious service: More than 4 times per year    Active member of club or organization: No    Attends meetings of clubs or organizations: Never    Relationship status: Married  Other Topics Concern  . Not on file  Social History Narrative  . Not on file   Tobacco Counseling Counseling given: Not Answered   Clinical  Intake:  Pre-visit preparation completed: Yes  Pain : No/denies pain     BMI - recorded: 27.26 Nutritional Status: BMI 25 -29 Overweight Nutritional Risks: None Diabetes: No  How often do you need to have someone help you when you read instructions, pamphlets, or other written materials from your doctor or pharmacy?: 1 - Never  Interpreter Needed?: No  Information entered by :: Clemetine Marker LPN  Activities of  Daily Living In your present state of health, do you have any difficulty performing the following activities: 07/04/2019 06/25/2019  Hearing? Y Y  Comment does not wear his hearing aids -  Vision? N N  Comment wears hearing aids -  Difficulty concentrating or making decisions? Y Y  Comment short term memory -  Walking or climbing stairs? N N  Dressing or bathing? N N  Doing errands, shopping? N N  Preparing Food and eating ? N -  Using the Toilet? N -  In the past six months, have you accidently leaked urine? N -  Do you have problems with loss of bowel control? N -  Managing your Medications? N -  Managing your Finances? N -  Housekeeping or managing your Housekeeping? N -  Some recent data might be hidden     Immunizations and Health Maintenance Immunization History  Administered Date(s) Administered  . Influenza, High Dose Seasonal PF 09/16/2017, 09/24/2018  . Influenza-Unspecified 08/26/2014, 09/22/2015  . Pneumococcal Conjugate-13 11/24/2014, 09/22/2015  . Pneumococcal Polysaccharide-23 09/24/2018  . Tdap 06/21/2011   There are no preventive care reminders to display for this patient.  Patient Care Team: Lada, Satira Anis, MD as PCP - General (Family Medicine) Rockey Situ Kathlene November, MD as PCP - Cardiology (Cardiology) Minna Merritts, MD as Consulting Physician (Cardiology) Vladimir Crofts, MD as Consulting Physician (Neurology)  Indicate any recent Medical Services you may have received from other than [facility] providers in the past year (date may be  approximate).    Assessment:   This is a routine wellness examination for Abdelrahman.  Hearing/Vision screen  Hearing Screening   125Hz  250Hz  500Hz  1000Hz  2000Hz  3000Hz  4000Hz  6000Hz  8000Hz   Right ear:           Left ear:           Comments: Pt c/o hearing difficulty, has hearing aids but does not wear them  Vision Screening Comments: Annual vision screenings at [facility]  Dietary issues and exercise activities discussed: Current Exercise Habits: Home exercise routine, Type of exercise: walking, Time (Minutes): 25, Frequency (Times/Week): 7, Weekly Exercise (Minutes/Week): 175, Intensity: Moderate, Exercise limited by: cardiac condition(s)  Goals    . Increase physical activity     Pt would like to increase exercise to doing something daily       Depression Screen PHQ 2/9 Scores 07/04/2019 06/25/2019 02/04/2019 12/14/2018  PHQ - 2 Score 0 0 0 0  PHQ- 9 Score - 0 0 0    Fall Risk Fall Risk  07/04/2019 06/25/2019 04/11/2019 02/04/2019 12/14/2018  Falls in the past year? 1 1 0 1 0  Number falls in past yr: 1 1 - 1 -  Injury with Fall? 0 0 - 0 -  Comment - - - - -  Risk for fall due to : History of fall(s) - - - -  Follow up Falls prevention discussed - - - -    FALL RISK PREVENTION PERTAINING TO THE HOME:  Any stairs in or around the home? Yes  If so, do they handrails? Yes   Home free of loose throw rugs in walkways, pet beds, electrical cords, etc? Yes  Adequate lighting in your home to reduce risk of falls? Yes   ASSISTIVE DEVICES UTILIZED TO PREVENT FALLS:  Life alert? No  Use of a cane, walker or w/c? No  Grab bars in the bathroom? Yes  Shower chair or bench in shower? No  Elevated toilet seat or a  handicapped toilet? No   DME ORDERS:  DME order needed?  No   TIMED UP AND GO:  Was the test performed? Yes .  Length of time to ambulate 10 feet: 5 sec.   GAIT:  Appearance of gait: Gait stead-fast and without the use of an assistive device.  Education: Fall  risk prevention has been discussed.  Intervention(s) required? No    Cognitive Function:     6CIT Screen 07/04/2019 10/12/2018  What Year? 0 points 4 points  What month? 0 points 0 points  What time? 0 points 0 points  Count back from 20 0 points 0 points  Months in reverse 2 points 4 points  Repeat phrase 2 points 4 points  Total Score 4 12    Screening Tests Health Maintenance  Topic Date Due  . INFLUENZA VACCINE  07/27/2019  . TETANUS/TDAP  06/20/2021  . PNA vac Low Risk Adult  Completed    Qualifies for Shingles Vaccine? Yes . Due for Shingrix. Education has been provided regarding the importance of this vaccine. Pt has been advised to call insurance company to determine out of pocket expense. Advised may also receive vaccine at local pharmacy or Health Dept. Verbalized acceptance and understanding.  Tdap: Up to date   Flu Vaccine: Up to date  Pneumococcal Vaccine: Up to date  Cancer Screenings:  Colorectal Screening: Completed 07/06/12. No longer required.   Lung Cancer Screening: (Low Dose CT Chest recommended if Age 61-80 years, 30 pack-year currently smoking OR have quit w/in 15years.) does not qualify.    Additional Screening:  Hepatitis C Screening: no longer required  Vision Screening: Recommended annual ophthalmology exams for early detection of glaucoma and other disorders of the eye. Is the patient up to date with their annual eye exam?  Yes  Who is the provider or what is the name of the office in which the pt attends annual eye exams? [place] Screening: Recommended annual dental exams for proper oral hygiene  Community Resource Referral:  CRR required this visit?  No       Plan:    I have personally reviewed and addressed the Medicare Annual Wellness questionnaire and have noted the following in the patient's chart:  A. Medical and social history B. Use of alcohol, tobacco or illicit drugs  C. Current medications and  supplements D. Functional ability and status E.  Nutritional status F.  Physical activity G. Advance directives H. List of other physicians I.  Hospitalizations, surgeries, and ER visits in previous 12 months J.  [place] such as hearing and vision if needed, cognitive and depression L. Referrals and appointments   In addition, I have reviewed and discussed with patient certain preventive protocols, quality metrics, and best practice recommendations. A written personalized care plan for preventive services as well as general preventive health recommendations were provided to patient.   Signed,  Clemetine Marker, LPN Nurse Health Advisor   Nurse Notes: pt doing well and appreciative of visit today.

## 2019-07-05 DIAGNOSIS — M25612 Stiffness of left shoulder, not elsewhere classified: Secondary | ICD-10-CM | POA: Diagnosis not present

## 2019-07-05 DIAGNOSIS — M25511 Pain in right shoulder: Secondary | ICD-10-CM | POA: Diagnosis not present

## 2019-07-05 DIAGNOSIS — M25512 Pain in left shoulder: Secondary | ICD-10-CM | POA: Diagnosis not present

## 2019-07-05 DIAGNOSIS — M25611 Stiffness of right shoulder, not elsewhere classified: Secondary | ICD-10-CM | POA: Diagnosis not present

## 2019-07-08 DIAGNOSIS — M25611 Stiffness of right shoulder, not elsewhere classified: Secondary | ICD-10-CM | POA: Diagnosis not present

## 2019-07-08 DIAGNOSIS — M25512 Pain in left shoulder: Secondary | ICD-10-CM | POA: Diagnosis not present

## 2019-07-08 DIAGNOSIS — M25612 Stiffness of left shoulder, not elsewhere classified: Secondary | ICD-10-CM | POA: Diagnosis not present

## 2019-07-08 DIAGNOSIS — M25511 Pain in right shoulder: Secondary | ICD-10-CM | POA: Diagnosis not present

## 2019-07-09 ENCOUNTER — Ambulatory Visit: Payer: PPO | Admitting: Physical Therapy

## 2019-07-11 ENCOUNTER — Ambulatory Visit: Payer: PPO | Admitting: Physical Therapy

## 2019-07-16 ENCOUNTER — Ambulatory Visit: Payer: PPO | Admitting: Physical Therapy

## 2019-07-18 ENCOUNTER — Ambulatory Visit: Payer: PPO | Admitting: Physical Therapy

## 2019-08-01 DIAGNOSIS — D2230 Melanocytic nevi of unspecified part of face: Secondary | ICD-10-CM | POA: Diagnosis not present

## 2019-08-01 DIAGNOSIS — D1800 Hemangioma unspecified site: Secondary | ICD-10-CM | POA: Diagnosis not present

## 2019-08-01 DIAGNOSIS — L814 Other melanin hyperpigmentation: Secondary | ICD-10-CM | POA: Diagnosis not present

## 2019-08-01 DIAGNOSIS — L905 Scar conditions and fibrosis of skin: Secondary | ICD-10-CM | POA: Diagnosis not present

## 2019-08-01 DIAGNOSIS — L578 Other skin changes due to chronic exposure to nonionizing radiation: Secondary | ICD-10-CM | POA: Diagnosis not present

## 2019-08-01 DIAGNOSIS — D225 Melanocytic nevi of trunk: Secondary | ICD-10-CM | POA: Diagnosis not present

## 2019-08-01 DIAGNOSIS — Z85828 Personal history of other malignant neoplasm of skin: Secondary | ICD-10-CM

## 2019-08-01 DIAGNOSIS — D229 Melanocytic nevi, unspecified: Secondary | ICD-10-CM | POA: Diagnosis not present

## 2019-08-01 DIAGNOSIS — Z1283 Encounter for screening for malignant neoplasm of skin: Secondary | ICD-10-CM | POA: Diagnosis not present

## 2019-08-01 DIAGNOSIS — C44519 Basal cell carcinoma of skin of other part of trunk: Secondary | ICD-10-CM | POA: Diagnosis not present

## 2019-08-01 DIAGNOSIS — L821 Other seborrheic keratosis: Secondary | ICD-10-CM | POA: Diagnosis not present

## 2019-08-01 DIAGNOSIS — D692 Other nonthrombocytopenic purpura: Secondary | ICD-10-CM | POA: Diagnosis not present

## 2019-08-01 HISTORY — DX: Personal history of other malignant neoplasm of skin: Z85.828

## 2019-08-06 DIAGNOSIS — M19012 Primary osteoarthritis, left shoulder: Secondary | ICD-10-CM | POA: Diagnosis not present

## 2019-08-07 ENCOUNTER — Telehealth: Payer: Self-pay | Admitting: Cardiovascular Disease

## 2019-08-07 NOTE — Telephone Encounter (Signed)
° °  [facility] Pre-operative Risk Assessment    Request for surgical clearance:  1. What type of surgery is being performed? Lt TSA trumatch shoulder    2. When is this surgery scheduled? 09/11/2019   3. What type of clearance is required (medical clearance vs. Pharmacy clearance to hold med vs. Both)? Both   4. Are there any medications that need to be held prior to surgery and how long? None listed, please advise if patient needs to stop/restart any medication.  5. Practice name and name of physician performing surgery? [facility]    6. What is your office phone number [phone]    7.   What is your office fax number [phone]  8.   Anesthesia type (None, local, MAC, general) ? None listed    Ace Gins 08/07/2019, 3:13 PM  _________________________________________________________________   (provider comments below)

## 2019-08-08 ENCOUNTER — Other Ambulatory Visit: Payer: Self-pay | Admitting: Orthopedic Surgery

## 2019-08-08 ENCOUNTER — Telehealth: Payer: Self-pay | Admitting: Cardiovascular Disease

## 2019-08-08 DIAGNOSIS — M19012 Primary osteoarthritis, left shoulder: Secondary | ICD-10-CM

## 2019-08-08 NOTE — Telephone Encounter (Signed)
LBM 08/08/2019 12:23 PM

## 2019-08-08 NOTE — Telephone Encounter (Signed)
New message:     Patient retuning Stephen Stein call from yesterday. Please call patient.

## 2019-08-09 NOTE — Telephone Encounter (Signed)
   Primary Cardiologist: Ida Rogue, MD  Chart reviewed and patient contacted by phone today as part of pre-operative protocol coverage. Given past medical history and time since last visit, based on ACC/AHA guidelines, Stephen Stein would be at acceptable risk for the planned procedure without further cardiovascular testing.   OK to hold aspirin 3-5 days pre op if needed.  I will route this recommendation to the requesting party via Epic fax function and remove from pre-op pool.  Please call with questions.  Kerin Ransom, PA-C 08/09/2019, 9:36 AM

## 2019-08-14 ENCOUNTER — Other Ambulatory Visit: Payer: Self-pay

## 2019-08-14 ENCOUNTER — Ambulatory Visit
Admission: RE | Admit: 2019-08-14 | Discharge: 2019-08-14 | Disposition: A | Discharge status: Home / Self Care | Payer: PPO | Source: Ambulatory Visit | Attending: Orthopedic Surgery | Admitting: Orthopedic Surgery

## 2019-08-14 DIAGNOSIS — Z01818 Encounter for other preprocedural examination: Secondary | ICD-10-CM | POA: Diagnosis not present

## 2019-08-14 DIAGNOSIS — M19012 Primary osteoarthritis, left shoulder: Secondary | ICD-10-CM | POA: Diagnosis not present

## 2019-08-22 ENCOUNTER — Other Ambulatory Visit: Payer: Self-pay | Admitting: Orthopedic Surgery

## 2019-08-26 ENCOUNTER — Encounter: Payer: Self-pay | Admitting: Nurse Practitioner

## 2019-08-26 ENCOUNTER — Other Ambulatory Visit: Payer: Self-pay

## 2019-08-26 ENCOUNTER — Ambulatory Visit (INDEPENDENT_AMBULATORY_CARE_PROVIDER_SITE_OTHER): Payer: PPO | Admitting: Nurse Practitioner

## 2019-08-26 VITALS — BP 124/72 | HR 78 | Temp 97.1°F | Resp 14 | Ht 66.0 in | Wt 192.5 lb

## 2019-08-26 DIAGNOSIS — I700 Atherosclerosis of aorta: Secondary | ICD-10-CM | POA: Diagnosis not present

## 2019-08-26 DIAGNOSIS — M19012 Primary osteoarthritis, left shoulder: Secondary | ICD-10-CM | POA: Diagnosis not present

## 2019-08-26 DIAGNOSIS — M19011 Primary osteoarthritis, right shoulder: Secondary | ICD-10-CM | POA: Diagnosis not present

## 2019-08-26 DIAGNOSIS — Z5181 Encounter for therapeutic drug level monitoring: Secondary | ICD-10-CM

## 2019-08-26 DIAGNOSIS — I10 Essential (primary) hypertension: Secondary | ICD-10-CM

## 2019-08-26 DIAGNOSIS — E782 Mixed hyperlipidemia: Secondary | ICD-10-CM

## 2019-08-26 DIAGNOSIS — Z01818 Encounter for other preprocedural examination: Secondary | ICD-10-CM

## 2019-08-26 NOTE — Progress Notes (Signed)
Name: Stephen Stein   MRN: [id number]    DOB: 1941/02/28   Date:08/26/2019       Progress Note  Subjective  Chief Complaint  Chief Complaint  Patient presents with  . Follow-up  . surgical clearance    HPI  Patient presents for surgical clearance for total shoulder arthroplasty scheduled on 09/11/2019 with Dr. Kurtis Bushman MD. 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  Patient states he will need surgery in both shoulders however they will do his left shoulder as it is not his dominant hand first and after recovery consider the right shoulder.  He has been treating his shoulders with rest, taking acetaminophen-no more than 3000 mg in 1 day, and using supplements such as glucosamine and condroitin.  Patient has no known history of moderate or greater valvular stenonis or regurgitation, cardiac implantable electronic decide, pulmonary hypertension, congenital heart diease or severe systemic disease.  ECHO from 2019 shows trivial aortic & tricuspid regurgitation, EF was 45-50%. Cardiac clearance was provided by Dr. Rockey Situ- cardiology on 08/07/2019.  Can patient have(>4MET): climb a flight of stairs moderate recreational activity ( golfing, bowling, dancing) Yard work ( raking leaves, weeding, Research scientist (life sciences))    RCRI 0 points   Patient had a transient history of hypertension, is not currently on medication is controlled with diet and lifestyle modification. BP Readings from Last 3 Encounters:  08/26/19 124/72  07/04/19 112/60  06/25/19 118/70   Patient taking Crestor 10 mg daily for hyperlipidemia.  He has associated aortic and carotid atherosclerosis.  He has coronary artery disease and this is managed by Dr. Rockey Situ his cardiologist.  He takes 81 mg aspirin daily.  He has a second degree AV block type I and left bundle branch block, no chest pain, shortness of breath, lightheadedness, dizziness.  He is taking Zoloft 50 mg daily for depression which is currently in  remission.  He states overall he is doing well PHQ2/9: Depression screen [facility] 2/9 08/26/2019 07/04/2019 06/25/2019 02/04/2019 12/14/2018  Decreased Interest 0 0 0 0 0  Down, Depressed, Hopeless 0 0 0 0 0  PHQ - 2 Score 0 0 0 0 0  Altered sleeping 0 - 0 0 0  Tired, decreased energy 0 - 0 0 0  Change in appetite 0 - 0 0 0  Feeling bad or failure about yourself  0 - 0 0 0  Trouble concentrating 0 - 0 0 0  Moving slowly or fidgety/restless 0 - 0 0 0  Suicidal thoughts 0 - 0 0 0  PHQ-9 Score 0 - 0 0 0  Difficult doing work/chores Not difficult at all - Not difficult at all Not difficult at all Not difficult at all     PHQ reviewed. Negative  Patient Active Problem List   Diagnosis Date Noted  . Carotid atherosclerosis, bilateral 02/20/2019  . Abnormal Holter exam 12/14/2018  . Vitamin B12 deficiency 10/15/2018  . Memory impairment 10/12/2018  . Hx of major depression 10/12/2018  . Biliary dyskinesia   . Protein-calorie malnutrition, severe 08/20/2018  . Gastritis without bleeding   . Duodenitis   . Nausea and vomiting 08/18/2018  . Left bundle branch block 06/25/2018  . Second degree AV block, Mobitz type I 06/25/2018  . Aortic calcification ([place]) 06/15/2018  . Adrenal adenoma 05/09/2017  . Pulmonary nodules 04/19/2017  . Medication monitoring encounter 04/19/2017  . BPH (benign prostatic hyperplasia) 04/19/2017  . Pain in wrist 01/20/2017  . 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  CAD (coronary artery disease) 03/25/2016  . Abnormal EKG 12/16/2015  . Hyperlipidemia 07/07/2015  . Essential hypertension 07/07/2015  . Hyperglycemia 07/07/2015    Past Medical History:  Diagnosis Date  . 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 Biliary dyskinesia    a.08/2018 s/p lap chole.  [name] BPH (benign prostatic hyperplasia)   . Carotid atherosclerosis, bilateral 02/20/2019   Feb 2020  . Coronary artery calcification seen on CT scan    a. 11/2015  Cardiac CT: Cor Ca2+ = 892 (80th %'ile). Diffuse Ca2+ in all 3 major coronary arteries; b. 02/2016 MV: HTN response to exercise. Abnl ecg w/ horizontal inflat ST depression, but no ischemia/infarct on imaging; c. 09/2018 MV: EF 64%, no ischemia. Very sm region of mild distal antsept defect, likely 2/2 lbbb.  . Hyperlipidemia   . Hypertension    For irregular heart beat  . Irregular heartbeat/Palpitations    a. Has been taking Atenolol for 20 years  . LBBB (left bundle branch block)   . Left ventricular hypokinesis    a. 09/2018 Echo: EF 45-50%, mod LVH, diff HK. Gr1 DD. Triv AI. Mildly dil Ao root. Mildly reduced RV fxn.  . Lightheadedness   . NSVT (nonsustained ventricular tachycardia) ([place])    a. 11/2018 Zio monitor - 13 beats of NSVT--asymptomatic.  . Orthostatic hypotension    a. 11/2018 pressures improved w/ midodrine/florinef but constant lightheadedness persists.  . Sinus bradycardia    a. 11/2018 Zio monitor: Avg HR 57 (min 28). Intermittent Mobitz I.    Past Surgical History:  Procedure Laterality Date  . CHOLECYSTECTOMY N/A 09/13/2018   Procedure: LAPAROSCOPIC CHOLECYSTECTOMY;  Surgeon: Jules Husbands, MD;  Location: [facility] ORS;  Service: General;  Laterality: N/A;  . COLONOSCOPY  2008  . ESOPHAGOGASTRODUODENOSCOPY (EGD) WITH PROPOFOL N/A 08/20/2018   Procedure: ESOPHAGOGASTRODUODENOSCOPY (EGD) WITH PROPOFOL;  Surgeon: Lucilla Lame, MD;  Location[facility] ENDOSCOPY;  Service: Endoscopy;  Laterality: N/A;  . TONSILLECTOMY AND ADENOIDECTOMY  1955    Social History   Tobacco Use  . Smoking status: Former Smoker    Quit date: 06/22/1992    Years since quitting: 27.1  . Smokeless tobacco: Former Systems developer    Quit date: 06/22/1992  Substance Use Topics  . Alcohol use: Yes    Alcohol/week: 1.0 standard drinks    Types: 1 Cans of beer per week    Comment: occasional     Current Outpatient Medications:  .  aspirin 81 MG chewable tablet, Chew 81 mg by mouth daily., Disp: , Rfl:  .   clonazePAM (KLONOPIN) 0.5 MG tablet, Take 0.5 tablets (0.25 mg total) by mouth 2 (two) times daily as needed (dizziness). 50 tabs to last 90 days, Disp: 50 tablet, Rfl: 0 .  Cyanocobalamin (VITAMIN B-12 PO), Take 1 tablet by mouth once daily, Disp: , Rfl:  .  donepezil (ARICEPT) 5 MG tablet, Take 5 mg by mouth daily. , Disp: , Rfl:  .  glucosamine-chondroitin 500-400 MG tablet, Take 1 tablet by mouth daily., Disp: , Rfl:  .  meloxicam (MOBIC) 7.5 MG tablet, Take 0.5-1 tablets (3.75-7.5 mg total) by mouth daily as needed for pain. (Patient taking differently: Take 3.75-7.5 mg by mouth daily as needed for pain. Pt takes very rarely), Disp: , Rfl:  .  rosuvastatin (CRESTOR) 10 MG tablet, TAKE 1 TABLET BY MOUTH EVERY DAY, Disp: 90 tablet, Rfl: 1 .  saw palmetto 160 MG capsule, Take 160 mg by mouth  2 (two) times daily., Disp: , Rfl:  .  sertraline (ZOLOFT) 50 MG tablet, Take 25 mg by mouth daily. Take 25 mg one tablet daily for 2 weeks.  Increase 25 mg to 50 mg one tablet daily thereafter., Disp: , Rfl:   No Known Allergies  ROS    No other specific complaints in a complete review of systems (except as listed in HPI above).  Objective  Vitals:   08/26/19 0834  BP: 124/72  Pulse: 78  Resp: 14  Temp: (!) 97.1 F (36.2 C)  SpO2: 98%  Weight: 192 lb 8 oz (87.3 kg)  Height: 5\' 6"  (1.676 m)     Body mass index is 31.07 kg/m.  Nursing Note and Vital Signs reviewed.  Physical Exam Vitals signs reviewed.  Constitutional:      Appearance: He is well-developed.  HENT:     Head: Normocephalic and atraumatic.  Eyes:     Extraocular Movements: Extraocular movements intact.     Conjunctiva/sclera: Conjunctivae normal.     Pupils: Pupils are equal, round, and reactive to light.  Neck:     Musculoskeletal: Normal range of motion and neck supple.     Vascular: No carotid bruit.  Cardiovascular:     Pulses: Normal pulses.     Heart sounds: Normal heart sounds.  Pulmonary:     Effort:  Pulmonary effort is normal.     Breath sounds: Normal breath sounds.  Abdominal:     General: Bowel sounds are normal.     Palpations: Abdomen is soft.     Tenderness: There is no abdominal tenderness.  Musculoskeletal:        General: No deformity.     Right lower leg: No edema.     Left lower leg: No edema.  Skin:    General: Skin is warm and dry.     Capillary Refill: Capillary refill takes less than 2 seconds.  Neurological:     Mental Status: He is alert and oriented to person, place, and time.     GCS: GCS eye subscore is 4. GCS verbal subscore is 5. GCS motor subscore is 6.     Sensory: No sensory deficit.     Gait: Gait normal.  Psychiatric:        Mood and Affect: Mood normal.        Speech: Speech normal.        Behavior: Behavior normal.        Thought Content: Thought content normal.        Judgment: Judgment normal.       No results found for this or any previous visit (from the past 48 hour(s)).  Assessment & Plan   1. Preoperative examination Medically cleared- paperwork faxed to cardiologist for sign off on cardiac clearance   2. Bilateral shoulder region arthritis Continue follow-up with ortho- plan for surgery   3. Aortic calcification (HCC) Continue statin  - Lipid Profile  4. Mixed hyperlipidemia Continue meds - Lipid Profile  5. Essential hypertension Well controlled with out medications   6. Medication monitoring encounter - COMPLETE METABOLIC PANEL WITH GFR  Face-to-face time with patient was more than 25 minutes, >50% time spent counseling and coordination of care

## 2019-08-27 LAB — LIPID PANEL
Cholesterol: 135 mg/dL (ref <200)
HDL: 41 mg/dL (ref >=40)
LDL Cholesterol (Calc): 73 mg/dL (calc)
Non-HDL Cholesterol (Calc): 94 mg/dL (calc) (ref <130)
Total CHOL/HDL Ratio: 3.3 (calc) (ref <5.0)
Triglycerides: 133 mg/dL (ref <150)

## 2019-08-27 LAB — COMPLETE METABOLIC PANEL WITH GFR
AG Ratio: 1.6 (calc) (ref 1.0–2.5)
ALT: 24 U/L (ref 9–46)
AST: 21 U/L (ref 10–35)
Albumin: 4.2 g/dL (ref 3.6–5.1)
Alkaline phosphatase (APISO): 26 U/L — ABNORMAL LOW (ref 35–144)
BUN: 15 mg/dL (ref 7–25)
CO2: 29 mmol/L (ref 20–32)
Calcium: 10.1 mg/dL (ref 8.6–10.3)
Chloride: 104 mmol/L (ref 98–110)
Creat: 1.11 mg/dL (ref 0.70–1.18)
GFR, Est African American: 74 mL/min/{1.73_m2} (ref >=60)
GFR, Est Non African American: 64 mL/min/{1.73_m2} (ref >=60)
Globulin: 2.6 g/dL (calc) (ref 1.9–3.7)
Glucose, Bld: 106 mg/dL — ABNORMAL HIGH (ref 65–99)
Potassium: 4.8 mmol/L (ref 3.5–5.3)
Sodium: 139 mmol/L (ref 135–146)
Total Bilirubin: 0.5 mg/dL (ref 0.2–1.2)
Total Protein: 6.8 g/dL (ref 6.1–8.1)

## 2019-08-29 DIAGNOSIS — Z01818 Encounter for other preprocedural examination: Secondary | ICD-10-CM | POA: Diagnosis not present

## 2019-08-29 DIAGNOSIS — M25512 Pain in left shoulder: Secondary | ICD-10-CM | POA: Diagnosis not present

## 2019-08-29 DIAGNOSIS — M19012 Primary osteoarthritis, left shoulder: Secondary | ICD-10-CM | POA: Diagnosis not present

## 2019-08-29 DIAGNOSIS — M25612 Stiffness of left shoulder, not elsewhere classified: Secondary | ICD-10-CM | POA: Diagnosis not present

## 2019-09-06 ENCOUNTER — Other Ambulatory Visit: Payer: Self-pay

## 2019-09-06 ENCOUNTER — Encounter
Admission: RE | Admit: 2019-09-06 | Discharge: 2019-09-06 | Disposition: A | Discharge status: Home / Self Care | Payer: PPO | Source: Ambulatory Visit | Attending: Orthopedic Surgery | Admitting: Orthopedic Surgery

## 2019-09-06 DIAGNOSIS — Z20828 Contact with and (suspected) exposure to other viral communicable diseases: Secondary | ICD-10-CM | POA: Insufficient documentation

## 2019-09-06 DIAGNOSIS — M19012 Primary osteoarthritis, left shoulder: Secondary | ICD-10-CM | POA: Diagnosis not present

## 2019-09-06 DIAGNOSIS — Z01812 Encounter for preprocedural laboratory examination: Secondary | ICD-10-CM | POA: Insufficient documentation

## 2019-09-06 HISTORY — DX: Depression, unspecified: F32.A

## 2019-09-06 LAB — BASIC METABOLIC PANEL
Anion gap: 9 (ref 5–15)
BUN: 19 mg/dL (ref 8–23)
CO2: 24 mmol/L (ref 22–32)
Calcium: 9.5 mg/dL (ref 8.9–10.3)
Chloride: 105 mmol/L (ref 98–111)
Creatinine, Ser: 0.89 mg/dL (ref 0.61–1.24)
GFR calc Af Amer: >60 mL/min (ref >60)
GFR calc non Af Amer: >60 mL/min (ref >60)
Glucose, Bld: 101 mg/dL — ABNORMAL HIGH (ref 70–99)
Potassium: 4.3 mmol/L (ref 3.5–5.1)
Sodium: 138 mmol/L (ref 135–145)

## 2019-09-06 LAB — URINALYSIS, ROUTINE W REFLEX MICROSCOPIC
Bilirubin Urine: NEGATIVE
Glucose, UA: NEGATIVE mg/dL
Hgb urine dipstick: NEGATIVE
Ketones, ur: NEGATIVE mg/dL
Leukocytes,Ua: NEGATIVE
Nitrite: NEGATIVE
Protein, ur: NEGATIVE mg/dL
Specific Gravity, Urine: 1.018 (ref 1.005–1.030)
pH: 6 (ref 5.0–8.0)

## 2019-09-06 LAB — CBC
HCT: 40.6 % (ref 39.0–52.0)
Hemoglobin: 13.9 g/dL (ref 13.0–17.0)
MCH: 32.3 pg (ref 26.0–34.0)
MCHC: 34.2 g/dL (ref 30.0–36.0)
MCV: 94.4 fL (ref 80.0–100.0)
Platelets: 230 10*3/uL (ref 150–400)
RBC: 4.3 MIL/uL (ref 4.22–5.81)
RDW: 12.4 % (ref 11.5–15.5)
WBC: 7.5 10*3/uL (ref 4.0–10.5)
nRBC: 0 % (ref 0.0–0.2)

## 2019-09-06 LAB — SURGICAL PCR SCREEN
MRSA, PCR: NEGATIVE
Staphylococcus aureus: NEGATIVE

## 2019-09-06 LAB — PROTIME-INR
INR: 1.1 (ref 0.8–1.2)
Prothrombin Time: 13.7 seconds (ref 11.4–15.2)

## 2019-09-06 LAB — APTT: aPTT: 36 seconds (ref 24–36)

## 2019-09-06 NOTE — Patient Instructions (Signed)
Your procedure is scheduled on: Wednesday 09/11/19 Report to [facility]. To find out your arrival time please call [phone] between 1PM - 3PM on Tuesday 09/10/19.  Remember: Instructions that are not followed completely may result in serious medical risk, up to and including death, or upon the discretion of your surgeon and anesthesiologist your surgery may need to be rescheduled.     _X__ 1. Do not eat food after midnight the night before your procedure.                 No gum chewing or hard candies. You may drink clear liquids up to 2 hours                 before you are scheduled to arrive for your surgery- DO not drink clear                 liquids within 2 hours of the start of your surgery.                 Clear Liquids include:  water, apple juice without pulp, clear carbohydrate                 drink such as Clearfast or Gatorade, Black Coffee or Tea (Do not add                 anything to coffee or tea). Diabetics water only  __X__2.  On the morning of surgery brush your teeth with toothpaste and water, you                 may rinse your mouth with mouthwash if you wish.  Do not swallow any              toothpaste of mouthwash.     _X__ 3.  No Alcohol for 24 hours before or after surgery.   _X__ 4.  Do Not Smoke or use e-cigarettes For 24 Hours Prior to Your Surgery.                 Do not use any chewable tobacco products for at least 6 hours prior to                 surgery.  ____  5.  Bring all medications with you on the day of surgery if instructed.   __X__  6.  Notify your doctor if there is any change in your medical condition      (cold, fever, infections).     Do not wear jewelry, make-up, hairpins, clips or nail polish. Do not wear lotions, powders, or perfumes.  Do not shave 48 hours prior to surgery. Men may shave face and neck. Do not bring valuables to the hospital.    [facility] is not responsible for  any belongings or valuables.  Contacts, dentures/partials or body piercings may not be worn into surgery. Bring a case for your contacts, glasses or hearing aids, a denture cup will be supplied. Leave your suitcase in the car. After surgery it may be brought to your room. For patients admitted to the hospital, discharge time is determined by your treatment team.   Patients discharged the day of surgery will not be allowed to drive home.   Please read over the following fact sheets that you were given:   MRSA Information  __X__ Take these medicines the morning of surgery with A SIP OF WATER:  1. none  2.   3.   4.  5.  6.  ____ Fleet Enema (as directed)   __X__ Use CHG Soap/SAGE wipes as directed  ____ Use inhalers on the day of surgery  ____ Stop metformin/Janumet/Farxiga 2 days prior to surgery    ____ Take 1/2 of usual insulin dose the night before surgery. No insulin the morning          of surgery.   ____ Stop Blood Thinners Coumadin/Plavix/Xarelto/Pleta/Pradaxa/Eliquis/Effient/Aspirin  on   Or contact your Surgeon, Cardiologist or Medical Doctor regarding  ability to stop your blood thinners  __X__ Stop Anti-inflammatories 7 days before surgery such as Advil, Ibuprofen, Motrin,  BC or Goodies Powder, Naprosyn, Naproxen, Aleve, Aspirin, Meloxicam    __X__ Stop all herbal supplements, fish oil or vitamin E until after surgery. (glucosamine, saw palmetto and vitamin c)   ____ Bring C-Pap to the hospital.

## 2019-09-07 LAB — SARS CORONAVIRUS 2 (TAT 6-24 HRS): SARS Coronavirus 2: NEGATIVE

## 2019-09-10 MED ORDER — TRANEXAMIC ACID-NACL 1000-0.7 MG/100ML-% IV SOLN
1000.0000 mg | INTRAVENOUS | Status: AC
  Administered 2019-09-11: 09:00:00 1000 mg via INTRAVENOUS
  Filled 2019-09-10: qty 100

## 2019-09-11 ENCOUNTER — Encounter
Admission: RE | Disposition: A | Discharge status: Home / Self Care | Payer: Self-pay | Source: Home / Self Care | Attending: Orthopedic Surgery

## 2019-09-11 ENCOUNTER — Encounter: Payer: Self-pay | Admitting: Emergency Medicine

## 2019-09-11 ENCOUNTER — Inpatient Hospital Stay: Payer: PPO | Admitting: Anesthesiology

## 2019-09-11 ENCOUNTER — Inpatient Hospital Stay
Admission: RE | Admit: 2019-09-11 | Discharge: 2019-09-12 | DRG: 483 | Disposition: A | Discharge status: Home / Self Care | Payer: PPO | Attending: Orthopedic Surgery | Admitting: Orthopedic Surgery

## 2019-09-11 ENCOUNTER — Inpatient Hospital Stay: Payer: PPO

## 2019-09-11 ENCOUNTER — Other Ambulatory Visit: Payer: Self-pay

## 2019-09-11 DIAGNOSIS — Z96612 Presence of left artificial shoulder joint: Secondary | ICD-10-CM | POA: Diagnosis not present

## 2019-09-11 DIAGNOSIS — Z20828 Contact with and (suspected) exposure to other viral communicable diseases: Secondary | ICD-10-CM | POA: Diagnosis not present

## 2019-09-11 DIAGNOSIS — E785 Hyperlipidemia, unspecified: Secondary | ICD-10-CM | POA: Diagnosis not present

## 2019-09-11 DIAGNOSIS — N400 Enlarged prostate without lower urinary tract symptoms: Secondary | ICD-10-CM | POA: Diagnosis present

## 2019-09-11 DIAGNOSIS — Z471 Aftercare following joint replacement surgery: Secondary | ICD-10-CM | POA: Diagnosis not present

## 2019-09-11 DIAGNOSIS — Z87891 Personal history of nicotine dependence: Secondary | ICD-10-CM

## 2019-09-11 DIAGNOSIS — Z09 Encounter for follow-up examination after completed treatment for conditions other than malignant neoplasm: Secondary | ICD-10-CM

## 2019-09-11 DIAGNOSIS — G8918 Other acute postprocedural pain: Secondary | ICD-10-CM | POA: Diagnosis not present

## 2019-09-11 DIAGNOSIS — I10 Essential (primary) hypertension: Secondary | ICD-10-CM | POA: Diagnosis not present

## 2019-09-11 DIAGNOSIS — F329 Major depressive disorder, single episode, unspecified: Secondary | ICD-10-CM | POA: Diagnosis present

## 2019-09-11 DIAGNOSIS — M19012 Primary osteoarthritis, left shoulder: Principal | ICD-10-CM | POA: Diagnosis present

## 2019-09-11 DIAGNOSIS — M25512 Pain in left shoulder: Secondary | ICD-10-CM | POA: Diagnosis not present

## 2019-09-11 DIAGNOSIS — I6523 Occlusion and stenosis of bilateral carotid arteries: Secondary | ICD-10-CM | POA: Diagnosis present

## 2019-09-11 DIAGNOSIS — I447 Left bundle-branch block, unspecified: Secondary | ICD-10-CM | POA: Diagnosis present

## 2019-09-11 DIAGNOSIS — I2510 Atherosclerotic heart disease of native coronary artery without angina pectoris: Secondary | ICD-10-CM | POA: Diagnosis present

## 2019-09-11 HISTORY — PX: TOTAL SHOULDER ARTHROPLASTY: SHX126

## 2019-09-11 SURGERY — ARTHROPLASTY, SHOULDER, TOTAL
Anesthesia: General | Laterality: Left

## 2019-09-11 MED ORDER — KETOROLAC TROMETHAMINE 15 MG/ML IJ SOLN
7.5000 mg | Freq: Four times a day (QID) | INTRAMUSCULAR | Status: AC
  Administered 2019-09-11 (×2): 7.5 mg via INTRAVENOUS
  Filled 2019-09-11 (×2): qty 1

## 2019-09-11 MED ORDER — BUPIVACAINE-EPINEPHRINE (PF) 0.25% -1:200000 IJ SOLN
INTRAMUSCULAR | Status: AC
  Filled 2019-09-11: qty 30

## 2019-09-11 MED ORDER — BACITRACIN 50000 UNITS IM SOLR
INTRAMUSCULAR | Status: AC
  Filled 2019-09-11: qty 2

## 2019-09-11 MED ORDER — HYDROCODONE-ACETAMINOPHEN 7.5-325 MG PO TABS: 1.0000 | ORAL_TABLET | ORAL | Status: DC | PRN

## 2019-09-11 MED ORDER — LIDOCAINE HCL (PF) 1 % IJ SOLN
INTRAMUSCULAR | Status: AC
  Filled 2019-09-11: qty 5

## 2019-09-11 MED ORDER — SERTRALINE HCL 50 MG PO TABS
50.0000 mg | ORAL_TABLET | Freq: Every day | ORAL | Status: DC
  Administered 2019-09-11: 21:00:00 50 mg via ORAL
  Filled 2019-09-11: qty 1

## 2019-09-11 MED ORDER — MIDAZOLAM HCL 2 MG/2ML IJ SOLN
1.0000 mg | Freq: Once | INTRAMUSCULAR | Status: AC
  Administered 2019-09-11: 08:00:00 1 mg via INTRAVENOUS

## 2019-09-11 MED ORDER — CEFAZOLIN SODIUM-DEXTROSE 2-4 GM/100ML-% IV SOLN
2.0000 g | INTRAVENOUS | Status: AC
  Administered 2019-09-11: 09:00:00 2 g via INTRAVENOUS

## 2019-09-11 MED ORDER — HYDROCODONE-ACETAMINOPHEN 5-325 MG PO TABS: 1.0000 | ORAL_TABLET | ORAL | Status: DC | PRN

## 2019-09-11 MED ORDER — LACTATED RINGERS IV SOLN
INTRAVENOUS | Status: DC
  Administered 2019-09-11 (×2): via INTRAVENOUS

## 2019-09-11 MED ORDER — FENTANYL CITRATE (PF) 100 MCG/2ML IJ SOLN
INTRAMUSCULAR | Status: AC
  Administered 2019-09-11: 08:00:00 50 ug via INTRAVENOUS
  Filled 2019-09-11: qty 2

## 2019-09-11 MED ORDER — CHLORHEXIDINE GLUCONATE 4 % EX LIQD: 60.0000 mL | Freq: Once | CUTANEOUS | Status: DC

## 2019-09-11 MED ORDER — BUPIVACAINE HCL (PF) 0.5 % IJ SOLN
INTRAMUSCULAR | Status: AC
  Filled 2019-09-11: qty 10

## 2019-09-11 MED ORDER — ROSUVASTATIN CALCIUM 10 MG PO TABS
10.0000 mg | ORAL_TABLET | Freq: Every day | ORAL | Status: DC
  Administered 2019-09-12: 10:00:00 10 mg via ORAL
  Filled 2019-09-11: qty 1

## 2019-09-11 MED ORDER — ACETAMINOPHEN 325 MG PO TABS: 325.0000 mg | ORAL_TABLET | Freq: Four times a day (QID) | ORAL | Status: DC | PRN

## 2019-09-11 MED ORDER — DOCUSATE SODIUM 100 MG PO CAPS
100.0000 mg | ORAL_CAPSULE | Freq: Two times a day (BID) | ORAL | Status: DC
  Administered 2019-09-11 – 2019-09-12 (×2): 100 mg via ORAL
  Filled 2019-09-11 (×2): qty 1

## 2019-09-11 MED ORDER — SODIUM CHLORIDE FLUSH 0.9 % IV SOLN
INTRAVENOUS | Status: AC
  Filled 2019-09-11: qty 20

## 2019-09-11 MED ORDER — SODIUM CHLORIDE 0.9 % IR SOLN
Status: DC | PRN
  Administered 2019-09-11 (×2): 1000 mL

## 2019-09-11 MED ORDER — PROPOFOL 10 MG/ML IV BOLUS
INTRAVENOUS | Status: AC
  Filled 2019-09-11: qty 60

## 2019-09-11 MED ORDER — METOCLOPRAMIDE HCL 10 MG PO TABS: 5.0000 mg | ORAL_TABLET | Freq: Three times a day (TID) | ORAL | Status: DC | PRN

## 2019-09-11 MED ORDER — FAMOTIDINE 20 MG PO TABS
20.0000 mg | ORAL_TABLET | Freq: Once | ORAL | Status: AC
  Administered 2019-09-11: 07:00:00 20 mg via ORAL

## 2019-09-11 MED ORDER — NEOSTIGMINE METHYLSULFATE 10 MG/10ML IV SOLN
INTRAVENOUS | Status: DC | PRN
  Administered 2019-09-11: 4 mg via INTRAVENOUS

## 2019-09-11 MED ORDER — BUPIVACAINE LIPOSOME 1.3 % IJ SUSP
INTRAMUSCULAR | Status: AC
  Filled 2019-09-11: qty 20

## 2019-09-11 MED ORDER — NEOSTIGMINE METHYLSULFATE 10 MG/10ML IV SOLN
INTRAVENOUS | Status: AC
  Filled 2019-09-11: qty 1

## 2019-09-11 MED ORDER — VITAMIN B-12 1000 MCG PO TABS
1000.0000 ug | ORAL_TABLET | Freq: Every day | ORAL | Status: DC
  Administered 2019-09-12: 10:00:00 1000 ug via ORAL
  Filled 2019-09-11: qty 1

## 2019-09-11 MED ORDER — BUPIVACAINE-EPINEPHRINE (PF) 0.25% -1:200000 IJ SOLN
INTRAMUSCULAR | Status: DC | PRN
  Administered 2019-09-11: 10 mL

## 2019-09-11 MED ORDER — METOCLOPRAMIDE HCL 5 MG/ML IJ SOLN: 5.0000 mg | Freq: Three times a day (TID) | INTRAMUSCULAR | Status: DC | PRN

## 2019-09-11 MED ORDER — GLYCOPYRROLATE 0.2 MG/ML IJ SOLN
INTRAMUSCULAR | Status: DC | PRN
  Administered 2019-09-11: 0.6 mg via INTRAVENOUS

## 2019-09-11 MED ORDER — ACETAMINOPHEN 10 MG/ML IV SOLN
INTRAVENOUS | Status: DC | PRN
  Administered 2019-09-11: 1000 mg via INTRAVENOUS

## 2019-09-11 MED ORDER — FAMOTIDINE 20 MG PO TABS
ORAL_TABLET | ORAL | Status: AC
  Administered 2019-09-11: 07:00:00 20 mg via ORAL
  Filled 2019-09-11: qty 1

## 2019-09-11 MED ORDER — DIPHENHYDRAMINE HCL 12.5 MG/5ML PO ELIX: 12.5000 mg | ORAL_SOLUTION | ORAL | Status: DC | PRN

## 2019-09-11 MED ORDER — PHENOL 1.4 % MT LIQD
1.0000 | OROMUCOSAL | Status: DC | PRN
  Filled 2019-09-11: qty 177

## 2019-09-11 MED ORDER — BUPIVACAINE LIPOSOME 1.3 % IJ SUSP
INTRAMUSCULAR | Status: DC | PRN
  Administered 2019-09-11: 20 mL via PERINEURAL

## 2019-09-11 MED ORDER — GLYCOPYRROLATE 0.2 MG/ML IJ SOLN
INTRAMUSCULAR | Status: AC
  Filled 2019-09-11: qty 3

## 2019-09-11 MED ORDER — PROPOFOL 10 MG/ML IV BOLUS
INTRAVENOUS | Status: DC | PRN
  Administered 2019-09-11: 140 mg via INTRAVENOUS

## 2019-09-11 MED ORDER — SUCCINYLCHOLINE CHLORIDE 20 MG/ML IJ SOLN
INTRAMUSCULAR | Status: DC | PRN
  Administered 2019-09-11: 100 mg via INTRAVENOUS

## 2019-09-11 MED ORDER — MAGNESIUM HYDROXIDE 400 MG/5ML PO SUSP: 30.0000 mL | Freq: Every day | ORAL | Status: DC | PRN

## 2019-09-11 MED ORDER — ONDANSETRON HCL 4 MG/2ML IJ SOLN: 4.0000 mg | Freq: Once | INTRAMUSCULAR | Status: DC | PRN

## 2019-09-11 MED ORDER — FENTANYL CITRATE (PF) 100 MCG/2ML IJ SOLN: 25.0000 ug | INTRAMUSCULAR | Status: DC | PRN

## 2019-09-11 MED ORDER — ACETAMINOPHEN 10 MG/ML IV SOLN
INTRAVENOUS | Status: AC
  Filled 2019-09-11: qty 100

## 2019-09-11 MED ORDER — LIDOCAINE HCL (PF) 1 % IJ SOLN
INTRAMUSCULAR | Status: DC | PRN
  Administered 2019-09-11: 5 mL via SUBCUTANEOUS

## 2019-09-11 MED ORDER — ROCURONIUM BROMIDE 100 MG/10ML IV SOLN
INTRAVENOUS | Status: DC | PRN
  Administered 2019-09-11: 50 mg via INTRAVENOUS

## 2019-09-11 MED ORDER — CEFAZOLIN SODIUM-DEXTROSE 2-4 GM/100ML-% IV SOLN
INTRAVENOUS | Status: AC
  Filled 2019-09-11: qty 100

## 2019-09-11 MED ORDER — MIDAZOLAM HCL 2 MG/2ML IJ SOLN
INTRAMUSCULAR | Status: AC
  Administered 2019-09-11: 08:00:00 1 mg via INTRAVENOUS
  Filled 2019-09-11: qty 2

## 2019-09-11 MED ORDER — DONEPEZIL HCL 5 MG PO TABS
5.0000 mg | ORAL_TABLET | Freq: Every day | ORAL | Status: DC
  Administered 2019-09-11: 21:00:00 5 mg via ORAL
  Filled 2019-09-11: qty 1

## 2019-09-11 MED ORDER — LACTATED RINGERS IV SOLN
INTRAVENOUS | Status: DC
  Administered 2019-09-11: 21:00:00 via INTRAVENOUS

## 2019-09-11 MED ORDER — ONDANSETRON HCL 4 MG/2ML IJ SOLN
INTRAMUSCULAR | Status: DC | PRN
  Administered 2019-09-11: 4 mg via INTRAVENOUS

## 2019-09-11 MED ORDER — BUPIVACAINE HCL (PF) 0.5 % IJ SOLN
INTRAMUSCULAR | Status: DC | PRN
  Administered 2019-09-11: 10 mL via PERINEURAL

## 2019-09-11 MED ORDER — CEFAZOLIN SODIUM-DEXTROSE 2-4 GM/100ML-% IV SOLN
2.0000 g | Freq: Four times a day (QID) | INTRAVENOUS | Status: AC
  Administered 2019-09-11 – 2019-09-12 (×3): 2 g via INTRAVENOUS
  Filled 2019-09-11 (×3): qty 100

## 2019-09-11 MED ORDER — FENTANYL CITRATE (PF) 100 MCG/2ML IJ SOLN
INTRAMUSCULAR | Status: AC
  Filled 2019-09-11: qty 2

## 2019-09-11 MED ORDER — ONDANSETRON HCL 4 MG/2ML IJ SOLN: 4.0000 mg | Freq: Four times a day (QID) | INTRAMUSCULAR | Status: DC | PRN

## 2019-09-11 MED ORDER — ACETAMINOPHEN 500 MG PO TABS
500.0000 mg | ORAL_TABLET | Freq: Four times a day (QID) | ORAL | Status: AC
  Administered 2019-09-11 (×2): 500 mg via ORAL
  Filled 2019-09-11 (×2): qty 1

## 2019-09-11 MED ORDER — FENTANYL CITRATE (PF) 100 MCG/2ML IJ SOLN
INTRAMUSCULAR | Status: DC | PRN
  Administered 2019-09-11 (×2): 50 ug via INTRAVENOUS

## 2019-09-11 MED ORDER — ONDANSETRON HCL 4 MG PO TABS: 4.0000 mg | ORAL_TABLET | Freq: Four times a day (QID) | ORAL | Status: DC | PRN

## 2019-09-11 MED ORDER — DEXAMETHASONE SODIUM PHOSPHATE 10 MG/ML IJ SOLN
INTRAMUSCULAR | Status: DC | PRN
  Administered 2019-09-11: 8 mg via INTRAVENOUS

## 2019-09-11 MED ORDER — LIDOCAINE HCL (CARDIAC) PF 100 MG/5ML IV SOSY
PREFILLED_SYRINGE | INTRAVENOUS | Status: DC | PRN
  Administered 2019-09-11: 60 mg via INTRAVENOUS

## 2019-09-11 MED ORDER — MORPHINE SULFATE (PF) 2 MG/ML IV SOLN: 0.5000 mg | INTRAVENOUS | Status: DC | PRN

## 2019-09-11 MED ORDER — ALUM & MAG HYDROXIDE-SIMETH 200-200-20 MG/5ML PO SUSP: 30.0000 mL | ORAL | Status: DC | PRN

## 2019-09-11 MED ORDER — FENTANYL CITRATE (PF) 100 MCG/2ML IJ SOLN
50.0000 ug | Freq: Once | INTRAMUSCULAR | Status: AC
  Administered 2019-09-11: 08:00:00 50 ug via INTRAVENOUS

## 2019-09-11 MED ORDER — MENTHOL 3 MG MT LOZG
1.0000 | LOZENGE | OROMUCOSAL | Status: DC | PRN
  Filled 2019-09-11: qty 9

## 2019-09-11 SURGICAL SUPPLY — 62 items
APL PRP STRL LF DISP 70% ISPRP (MISCELLANEOUS) ×2
BLADE BOVIE TIP EXT 4 (BLADE) ×2 IMPLANT
BLADE PHOTON ILLUMINATED (MISCELLANEOUS) IMPLANT
BLADE SAW 1 (BLADE) ×2 IMPLANT
BODY UNITE ANATOMIC SZ12 (Miscellaneous) ×1 IMPLANT
BOWL CEMENT MIX W/ADAPTER (MISCELLANEOUS) ×2 IMPLANT
BRUSH SCRUB EZ  4% CHG (MISCELLANEOUS) ×1
BRUSH SCRUB EZ 4% CHG (MISCELLANEOUS) ×2 IMPLANT
CANISTER SUCT 1200ML W/VALVE (MISCELLANEOUS) ×2 IMPLANT
CANISTER SUCT 3000ML PPV (MISCELLANEOUS) ×4 IMPLANT
CEMENT BONE 1-PACK (Cement) ×2 IMPLANT
CHLORAPREP W/TINT 26 (MISCELLANEOUS) ×4 IMPLANT
COVER BACK TABLE REUSABLE LG (DRAPES) ×2 IMPLANT
COVER WAND RF STERILE (DRAPES) ×2 IMPLANT
CRADLE LAMINECT ARM (MISCELLANEOUS) ×4 IMPLANT
DRAPE 3/4 80X56 (DRAPES) ×4 IMPLANT
DRAPE INCISE IOBAN 66X60 STRL (DRAPES) ×4 IMPLANT
DRAPE SPLIT 6X30 W/TAPE (DRAPES) ×4 IMPLANT
DRAPE U-SHAPE 47X51 STRL (DRAPES) ×4 IMPLANT
DRSG TEGADERM 6X8 (GAUZE/BANDAGES/DRESSINGS) ×2 IMPLANT
ELECT REM PT RETURN 9FT ADLT (ELECTROSURGICAL) ×2
ELECTRODE REM PT RTRN 9FT ADLT (ELECTROSURGICAL) ×1 IMPLANT
GAUZE SPONGE 4X4 12PLY STRL (GAUZE/BANDAGES/DRESSINGS) IMPLANT
GAUZE XEROFORM 1X8 LF (GAUZE/BANDAGES/DRESSINGS) ×2 IMPLANT
GLENOID ANCHOR PEG CROSSLK 48 (Orthopedic Implant) ×1 IMPLANT
GLOVE INDICATOR 8.0 STRL GRN (GLOVE) ×2 IMPLANT
GLOVE SURG ORTHO 8.0 STRL STRW (GLOVE) ×2 IMPLANT
GOWN STRL REUS W/ TWL LRG LVL3 (GOWN DISPOSABLE) ×1 IMPLANT
GOWN STRL REUS W/ TWL XL LVL3 (GOWN DISPOSABLE) ×1 IMPLANT
GOWN STRL REUS W/TWL LRG LVL3 (GOWN DISPOSABLE) ×2
GOWN STRL REUS W/TWL XL LVL3 (GOWN DISPOSABLE) ×2
HEAD HUMERAL STD 52X15 (Head) ×1 IMPLANT
HOOD PEEL AWAY FLYTE STAYCOOL (MISCELLANEOUS) ×6 IMPLANT
IV NS 1000ML (IV SOLUTION) ×2
IV NS 1000ML BAXH (IV SOLUTION) ×1 IMPLANT
KIT STABILIZATION SHOULDER (MISCELLANEOUS) ×2 IMPLANT
KIT TURNOVER KIT A (KITS) ×2 IMPLANT
MASK FACE SPIDER DISP (MASK) ×2 IMPLANT
MAT ABSORB  FLUID 56X50 GRAY (MISCELLANEOUS) ×1
MAT ABSORB FLUID 56X50 GRAY (MISCELLANEOUS) ×1 IMPLANT
NDL REVERSE CUT 1/2 CRC (NEEDLE) ×1 IMPLANT
NDL SAFETY ECLIPSE 18X1.5 (NEEDLE) IMPLANT
NEEDLE HYPO 18GX1.5 SHARP (NEEDLE)
NEEDLE REVERSE CUT 1/2 CRC (NEEDLE) ×2 IMPLANT
NS IRRIG 1000ML POUR BTL (IV SOLUTION) ×2 IMPLANT
PACK ARTHROSCOPY SHOULDER (MISCELLANEOUS) ×2 IMPLANT
PIN METAGLENE 2.5 (PIN) ×1 IMPLANT
PULSAVAC PLUS IRRIG FAN TIP (DISPOSABLE) ×2
SLING ARM LRG DEEP (SOFTGOODS) ×2 IMPLANT
SPONGE LAP 18X18 RF (DISPOSABLE) ×2 IMPLANT
STAPLER SKIN PROX 35W (STAPLE) ×2 IMPLANT
STEM UNITE SZ12 (Stem) ×1 IMPLANT
STRAP SAFETY 5IN WIDE (MISCELLANEOUS) ×2 IMPLANT
SUT TICRON 2-0 30IN 311381 (SUTURE) IMPLANT
SUT VIC AB 0 CT2 27 (SUTURE) ×2 IMPLANT
SUT VIC AB 2-0 CT1 18 (SUTURE) ×2 IMPLANT
SUT VIC AB PLUS 45CM 1-MO-4 (SUTURE) IMPLANT
SYR 10ML LL (SYRINGE) ×2 IMPLANT
SYRINGE IRR TOOMEY STRL 70CC (SYRINGE) ×2 IMPLANT
TAPE SUT 30 1/2 CRC GREEN (SUTURE) ×4 IMPLANT
TIP FAN IRRIG PULSAVAC PLUS (DISPOSABLE) ×1 IMPLANT
WATER STERILE IRR 1000ML POUR (IV SOLUTION) ×4 IMPLANT

## 2019-09-11 NOTE — Progress Notes (Signed)
Pt educated on how to use incentive spirometry and blew up to 750cc without difficulty

## 2019-09-11 NOTE — Progress Notes (Signed)
Pt denies pain or discomfort. Tolerates PO fluids and feeds self with tray set up help only. Intermittent confusion noted at times. Oriented without difficulty .

## 2019-09-11 NOTE — H&P (Signed)
The patient has been re-examined, and the chart reviewed, and there have been no interval changes to the documented history and physical.  Plan a left total shoulder today. ? ?Anesthesia is consulted regarding a peripheral nerve block for post-operative pain. ? ?The risks, benefits, and alternatives have been discussed at length, and the patient is willing to proceed.   ? ?

## 2019-09-11 NOTE — Transfer of Care (Signed)
Immediate Anesthesia Transfer of Care Note  Patient: Stephen Stein  Procedure(s) Performed: TOTAL SHOULDER ARTHROPLASTY (Left )  Patient Location: PACU  Anesthesia Type:GA combined with regional for post-op pain  Level of Consciousness: awake, oriented and patient cooperative  Airway & Oxygen Therapy: Patient Spontanous Breathing and Patient connected to face mask oxygen  Post-op Assessment: Report given to RN and Post -op Vital signs reviewed and stable  Post vital signs: Reviewed and stable  Last Vitals:  Vitals Value Taken Time  BP 121/86 09/11/19 1104  Temp    Pulse 99 09/11/19 1107  Resp 25 09/11/19 1107  SpO2 100 % 09/11/19 1107  Vitals shown include unvalidated device data.  Last Pain:  Vitals:   09/11/19 0713  TempSrc: Tympanic  PainSc: 0-No pain         Complications: No apparent anesthesia complications

## 2019-09-11 NOTE — Progress Notes (Signed)
PT voided 50cc to urinal

## 2019-09-11 NOTE — Anesthesia Procedure Notes (Signed)
Procedure Name: Intubation Date/Time: 09/11/2019 8:40 AM Performed by: Lowry Bowl, CRNA Pre-anesthesia Checklist: Patient identified, Emergency Drugs available, Suction available and Patient being monitored Patient Re-evaluated:Patient Re-evaluated prior to induction Oxygen Delivery Method: Circle system utilized Preoxygenation: Pre-oxygenation with 100% oxygen Induction Type: IV induction and Cricoid Pressure applied Ventilation: Mask ventilation without difficulty Laryngoscope Size: McGraph and 4 Grade View: Grade I Tube type: Oral Tube size: 7.0 mm Number of attempts: 1 Airway Equipment and Method: Stylet and Video-laryngoscopy Placement Confirmation: ETT inserted through vocal cords under direct vision,  positive ETCO2 and breath sounds checked- equal and bilateral Secured at: 22 cm Tube secured with: Tape Dental Injury: Teeth and Oropharynx as per pre-operative assessment  Difficulty Due To: Difficulty was anticipated and Difficult Airway- due to anterior larynx

## 2019-09-11 NOTE — Op Note (Signed)
09/11/2019  10:53 AM  Patient:   Stephen Stein  Pre-Op Diagnosis:   Degenerative joint disease, left shoulder.  Post-Op Diagnosis:   Same.  Procedure:   Left total shoulder arthroplasty.  Surgeon:   Kurtis Bushman, MD  Assistant:  Carlynn Spry, PA-C  Anesthesia:   General endotracheal intubation with an interscalene block.  Findings:   As above. The rotator cuff was in satisfactory condition.  Complications:   None  EBL:  50 cc  Drains:   None  Closure:   Staples  Implants:   [employer] Unite system with a standard stem size 12 mm  With a 135 degree porocoat proximal body size 12, a 52 mm x 15 mm humeral head, and a 48 mm cemented glenoid component.  Brief Clinical Note:   The patient is a 78 year old man with chronic and severe left shoulder pain. He has failed conservative measures. The patient presents at this time for a left total shoulder arthroplasty.  Procedure:   The patient was brought into the operating room and lain in the supine position on the OR table. After adequate IV sedation was achieved, an interscalene block was placed by the anesthesiologist. The patient then underwent general endotracheal intubation and anesthesia before being repositioned in the beach chair position using the beach chair positioner. A Foley catheter was placed by the nurse. The left shoulder and upper extremity were prepped with ChloraPrep solution before being draped sterilely. Preoperative antibiotics were administered. A standard anterior approach to the shoulder was made through an approximately 4-5 inch incision. The incision was carried down through the subcutaneous tissues to expose the deltopectoral fascia. The interval between the deltoid and pectoralis muscles was identified and this plane developed, retracting the cephalic vein laterally with the deltoid muscle. The conjoined tendon was identified. The lateral margin was dissected and the Kolbel self-retraining retractor inserted. The  "three sisters" were identified and cauterized. Bursal tissues were removed to improve visualization. The subscapularis tendon was released from its attachment to the lesser tuberosity 1 cm proximal to its insertion and several tagging sutures placed. The inferior capsule was released with care after identifying and protecting the axillary nerve. The proximal humeral cut was made at approximately 30 of retroversion using the extra-medullary guide.   Attention was directed to the glenoid. The labrum was debrided circumferentially before the center of the glenoid was marked with electrocautery. The small and medium sizers were positioned and it was elected to proceed with a small glenoid component. The guidewire was drilled into the glenoid neck using the appropriate guide. After verifying its position, the glenoid was lightly reamed with the butterfly reamer before the centralizing reamer was used. The small peripheral peg guide was positioned and each of the three pegs drilled sequentially, leaving a peg in place so as to minimize shifting of the guide. The bony surfaces were prepared for cementing by irrigating them thoroughly with bacitracin saline solution using the jet lavage system. Meanwhile, cement was mixed on the back table. When it was ready, some cement was injected into each of the three peg holes using a Toomey syringe and additional cement applied to the posterior aspect of the glenoid component. The component was impacted into place and the excess cement was removed. Pressure was maintained on the glenoid until the cement hardened.  Attention was directed to the humeral side. The humeral canal was reamed sequentially beginning with the end-cutting reamer then progressing up to a 48 mm reamer. This provided  excellent circumferential chatter. The canal was prepared using a brosteotome. A trial reduction performed. The arm demonstrated excellent range of motion as the hand could be brought across  the chest to the opposite shoulder and brought to the top of the patient's head and to the patient's ear. The shoulder remained stable throughout this range of motion, and was stable with abduction and external rotation. The joint was dislocated and the trial components removed. The final components were impacted into place with care taken to maintain the appropriate version. Again, the [facility] taper locking mechanism was verified using manual distraction. The shoulder was relocated and again placed through a range of motion with the findings as described above.  The wound was copiously irrigated with bacitracin saline solution using the jet lavage system. The subscapularis tendon was reapproximated using 65mm cottony Dacron sutures. The deltopectoral interval was closed using #0 Vicryl interrupted sutures before the subcutaneous tissues were closed using 2-0 Vicryl interrupted sutures. The skin was closed using staples. A sterile occlusive dressing was applied to the wound before the arm was placed into a sling. The patient was then transferred back to a hospital bed before being awakened, extubated, and returned to the recovery room in satisfactory condition after tolerating the procedure well.

## 2019-09-11 NOTE — Anesthesia Procedure Notes (Signed)
Anesthesia Regional Block: Interscalene brachial plexus block   Pre-Anesthetic Checklist: ,, timeout performed, Correct Patient, Correct Site, Correct Laterality, Correct Procedure, Correct Position, site marked, Risks and benefits discussed,  Surgical consent,  Pre-op evaluation,  At surgeon's request and post-op pain management  Laterality: Left and Upper  Prep: chloraprep       Needles:  Injection technique: Single-shot  Needle Type: Stimiplex     Needle Length: 5cm  Needle Gauge: 22     Additional Needles:   Procedures:,,,, ultrasound used (permanent image in chart),,,,  Narrative:  Start time: 09/11/2019 8:15 AM End time: 09/11/2019 8:24 AM Injection made incrementally with aspirations every 5 mL.  Performed by: Personally  Anesthesiologist: Martha Clan, MD  Additional Notes: Functioning IV was confirmed and monitors were applied.  A 88mm 22ga Stimuplex needle was used. Sterile prep and drape,hand hygiene and sterile gloves were used.  Negative aspiration and negative test dose prior to incremental administration of local anesthetic. The patient tolerated the procedure well.

## 2019-09-11 NOTE — Anesthesia Postprocedure Evaluation (Signed)
Anesthesia Post Note  Patient: Stephen Stein  Procedure(s) Performed: TOTAL SHOULDER ARTHROPLASTY (Left )  Patient location during evaluation: PACU Anesthesia Type: General Level of consciousness: awake and alert and oriented Pain management: pain level controlled Vital Signs Assessment: post-procedure vital signs reviewed and stable Respiratory status: spontaneous breathing, nonlabored ventilation and respiratory function stable Cardiovascular status: blood pressure returned to baseline and stable Postop Assessment: no signs of nausea or vomiting Anesthetic complications: no     Last Vitals:  Vitals:   09/11/19 1149 09/11/19 1239  BP: (!) 100/58 118/72  Pulse: 79 80  Resp: (!) 21 15  Temp: (!) 36.1 C 36.6 C  SpO2: 97% 96%    Last Pain:  Vitals:   09/11/19 1239  TempSrc: Oral  PainSc:                  Dagoberto Nealy

## 2019-09-11 NOTE — Anesthesia Post-op Follow-up Note (Signed)
Anesthesia QCDR form completed.        

## 2019-09-11 NOTE — Anesthesia Preprocedure Evaluation (Signed)
Anesthesia Evaluation  Patient identified by MRN, date of birth, ID band Patient awake    Reviewed: Allergy & Precautions, NPO status , Patient's Chart, lab work & pertinent test results  History of Anesthesia Complications Negative for: history of anesthetic complications  Airway Mallampati: II       Dental  (+) Missing, Chipped, Partial Upper, Dental Advidsory Given   Pulmonary neg sleep apnea, neg COPD, former smoker,           Cardiovascular hypertension, Pt. 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 No date: BPH (benign prostatic hyperplasia) 02/20/2019: Carotid atherosclerosis, bilateral     Comment:  Feb 2020 No date: Coronary artery calcification seen on CT scan     Comment:  a. 11/2015 Cardiac CT: Cor Ca2+ = 892 (80th %'ile).               Diffuse Ca2+ in all 3 major coronary arteries; b. 02/2016               MV: HTN response to exercise. Abnl ecg w/ horizontal               inflat ST depression, but no ischemia/infarct on imaging;              c. 09/2018 MV: EF 64%, no ischemia. Very sm region of               mild distal antsept defect, likely 2/2 lbbb. No date: Depression No date: Hyperlipidemia No date: Hypertension     Comment:  For irregular heart beat No date: Irregular heartbeat/Palpitations     Comment:  a. Has been taking Atenolol for 20 years No date: LBBB (left bundle branch block) No date: Left ventricular  hypokinesis     Comment:  a. 09/2018 Echo: EF 45-50%, mod LVH, diff HK. Gr1 DD.               Triv AI. Mildly dil Ao root. Mildly reduced RV fxn. No date: Lightheadedness No date: NSVT (nonsustained ventricular tachycardia) ([place])     Comment:  a. 11/2018 Zio monitor - 13 beats of NSVT--asymptomatic. No date: Orthostatic hypotension     Comment:  a. 11/2018 pressures improved w/ midodrine/florinef but               constant lightheadedness persists. No date: Sinus bradycardia     Comment:  a. 11/2018 Zio monitor: Avg HR 57 (min 28). Intermittent              [place] I.   Reproductive/Obstetrics                             Anesthesia Physical  Anesthesia Plan  ASA: III  Anesthesia Plan: General   Post-op Pain Management:  Regional for Post-op pain   Induction: Intravenous  PONV Risk Score and Plan: 2 and Ondansetron, Dexamethasone  and Treatment may vary due to age or medical condition  Airway Management Planned: Oral ETT  Additional Equipment:   Intra-op Plan:   Post-operative Plan: Extubation in OR  Informed Consent: I have reviewed the patients History and Physical, chart, labs and discussed the procedure including the risks, benefits and alternatives for the proposed anesthesia with the patient or authorized representative who has indicated his/her understanding and acceptance.     Dental Advisory Given  Plan Discussed with: Anesthesiologist and CRNA  Anesthesia Plan Comments:         Anesthesia Quick Evaluation

## 2019-09-12 LAB — HEMOGLOBIN AND HEMATOCRIT, BLOOD
HCT: 33.9 % — ABNORMAL LOW (ref 39.0–52.0)
Hemoglobin: 11.7 g/dL — ABNORMAL LOW (ref 13.0–17.0)

## 2019-09-12 MED ORDER — HYDROCODONE-ACETAMINOPHEN 5-325 MG PO TABS: 1.0000 | ORAL_TABLET | ORAL | 0 refills | Status: DC | PRN

## 2019-09-12 NOTE — Discharge Instructions (Signed)
Wear sling at all times, including sleep.  You will need to use the sling for a total of 4 weeks following surgery. ° °Do not try and lift your arm up or away from your body for any reason.  ° °Keep the dressing dry.  You may remove bandage in 3 days.  You may place a Band-Aid over top of the incision. ° °May shower once dressing is removed in 3 days.  Remove sling carefully only for showers, leaving arm down by your side while in the shower. ° °+++ Make sure to take some pain medication this evening before you fall asleep, in preparation for the nerve block wearing off in the middle of the night. ° °If the the pain medication causes itching, or is too strong, try taking a single tablet at a time, or combining with Benadryl. ° °You may be most comfortable sleeping in a recliner.  If you do sleep in near bed, placed pillows behind the shoulder that have the operation to support it. °

## 2019-09-12 NOTE — Evaluation (Signed)
Physical Therapy Evaluation Patient Details Name: Stephen Stein MRN: [id number] DOB: January 30, 1941 Today's Date: 09/12/2019   History of Present Illness  78yo male s/p L TSA (09/11/19). PMHx includes CVA (mild coordination, balance, STM deficits remain), dysrhythmias, CAD, adrenal adenoma, HLD, and LBBB.  Clinical Impression  Upon evaluation, patient alert and oriented; follows commands and eager for mobility tasks.  Mildly impulsive, but easily redirectable.  L UE immobilized in sling; pain well-controlled.  Mild fine motor deficits reported in R UE (residual from previous CVA); otherwise, strength and ROM grossly [facility] for basic transfers and gait.  Higher-level balance deficits noted with gait efforts; intermittent R lateral sway/LOB during dynamic gait components (requiring LE step strategy or UE support for recovery).  Notably improved with use of SPC for additional support. Patient voicing optimal comfort/confidence with use of SPC; do recommend continued use immediately upon discharge. Patient voiced understanding/agreement. Would benefit from skilled PT to address above deficits and promote optimal return to PLOF; recommend transition to home with outpatient PT follow up to address L shoulder rehab and higher-level dynamic balance deficits.    Follow Up Recommendations Outpatient PT    Equipment Recommendations  Cane(patient to get from home)    Recommendations for Other Services       Precautions / Restrictions Precautions Precautions: Fall;Shoulder Type of Shoulder Precautions: LUE NWBing, no shoulder ROM, elbow/wrist/hand ROM ok, sling Restrictions Weight Bearing Restrictions: Yes LUE Weight Bearing: Non weight bearing      Mobility  Bed Mobility               General bed mobility comments: seated in recliner beginning/end of treatment session  Transfers Overall transfer level: Modified independent Equipment used: None Transfers: Sit to/from Stand            General transfer comment: fair strength/power bilat LEs, intermittent use of R UE for lift off and external stabilization  Ambulation/Gait   Gait Distance (Feet): 200 Feet Assistive device: None       General Gait Details: reciprocal stepping pattern with fair step height/length, good trunk rotation and arm swing; intermittent sway/LOB to R with dynamic gait components, use of LE step strategy and cga from therapist for recovery  Stairs            Wheelchair Mobility    Modified Rankin (Stroke Patients Only)       Balance Overall balance assessment: Needs assistance Sitting-balance support: No upper extremity supported;Feet supported Sitting balance-Leahy Scale: Good     Standing balance support: No upper extremity supported Standing balance-Leahy Scale: Fair Standing balance comment: self-corrects, heavily reliant on LE step strategy or UE support to recover                             Pertinent Vitals/Pain Pain Assessment: Faces Faces Pain Scale: Hurts a little bit Pain Location: L shoulder Pain Descriptors / Indicators: Aching    Home Living Family/patient expects to be discharged to:: Private residence Living Arrangements: Spouse/significant other Available Help at Discharge: Family;Available 24 hours/day Type of Home: House Home Access: Stairs to enter Entrance Stairs-Rails: Psychiatric nurse of Steps: 3 Home Layout: Laundry or work area in basement;One level Home Equipment: None      Prior Function Level of Independence: Independent   Gait / Transfers Assistance Needed: Indep with mobility, however endorses mild balance deficits and reports his wife tells him he needs to use a cane     Comments:  Pt endorses several falls in past 12 months 2/2 poor balance     Hand Dominance   Dominant Hand: Right    Extremity/Trunk Assessment   Upper Extremity Assessment Upper Extremity Assessment: (R UE grossly WFL, does  endorse some residual fine motor deficits in hand (difficulty writing); L UE in sling/immobilized, wrist and hand WFL)    Lower Extremity Assessment Lower Extremity Assessment: Overall WFL for tasks assessed(grossly at least 4+/5 throughout; no focal weakness appreciated)       Communication   Communication: No difficulties  Cognition Arousal/Alertness: Awake/alert Behavior During Therapy: WFL for tasks assessed/performed Overall Cognitive Status: No family/caregiver present to determine baseline cognitive functioning                                 General Comments: Alert and oriented x4; follows simple commands; mildly impulsive at times      General Comments      Exercises Other Exercises Other Exercises: 200' with SPC, cga/close sup-min cuing for can position and sequencing; improved safety/stability noted Other Exercises: Additional 200' with R loftstrand, cga/close sup-difficulty with swing/advancement of loftstrand; prefers [facility]   Assessment/Plan    PT Assessment Patient needs continued PT services  PT Problem List Decreased activity tolerance;Decreased balance;Decreased mobility;Decreased safety awareness;Pain;Decreased range of motion;Decreased strength       PT Treatment Interventions DME instruction;Gait training;Stair training;Functional mobility training;Therapeutic activities;Therapeutic exercise;Patient/family education;Balance training    PT Goals (Current goals can be found in the Care Plan section)  Acute Rehab PT Goals Patient Stated Goal: to return home PT Goal Formulation: With patient Time For Goal Achievement: 09/26/19 Potential to Achieve Goals: Good    Frequency 7X/week   Barriers to discharge        Co-evaluation               AM-PAC PT "6 Clicks" Mobility  Outcome Measure Help needed turning from your back to your side while in a flat bed without using bedrails?: None Help needed moving from lying on your back to  sitting on the side of a flat bed without using bedrails?: None Help needed moving to and from a bed to a chair (including a wheelchair)?: None Help needed standing up from a chair using your arms (e.g., wheelchair or bedside chair)?: None Help needed to walk in hospital room?: A Little Help needed climbing 3-5 steps with a railing? : A Little 6 Click Score: 22    End of Session Equipment Utilized During Treatment: Gait belt Activity Tolerance: Patient tolerated treatment well Patient left: in chair;with call bell/phone within reach;with chair alarm set Nurse Communication: Mobility status PT Visit Diagnosis: Muscle weakness (generalized) (M62.81);Difficulty in walking, not elsewhere classified (R26.2)    Time: [id number] PT Time Calculation (min) (ACUTE ONLY): 23 min   Charges:   PT Evaluation $PT Eval Moderate Complexity: 1 Mod PT Treatments $Gait Training: 8-22 mins        Nishan Ovens H. Owens Shark, PT, DPT, NCS 09/12/19, 3:09 PM [phone]

## 2019-09-12 NOTE — Evaluation (Signed)
Occupational Therapy Evaluation Patient Details Name: Stephen Stein MRN: [id number] DOB: 1941/03/03 Today's Date: 09/12/2019    History of Present Illness 78yo male s/p L TSA (09/11/19). PMHx includes CVA (mild coordination, balance, STM deficits remain), dysrhythmias, CAD, adrenal adenoma, HLD, and LBBB.   Clinical Impression   Patient was seen for an OT evaluation this date. Pt lives with his wife and 2 large dogs in a multi level home with bed/bath on main, 3 steps with bilat rails to enter, and 1 step to get in/out of living room. Prior to surgery, pt was active and generally independent with ADL and functional mobility. Wife manages medication and drives. Pt has not driven since his CVA approx a year ago, per pt. Pt reports mild coordination, balance, and STM deficits since CVA. Multiple falls in past year, yet pt does not feel he needs an AD for mobility. Pt has orders for LUE to be immobilized and will be NWBing per MD. Patient presents with impaired strength/ROM, pain, and sensation to LUE with block not completely resolved yet. These impairments result in a decreased ability to perform self care tasks requiring min-mod assist for UB/LB dressing and bathing and CGA for functional mobility with mild balance deficits noted along with decr safety awareness. Pt instructed in sling mgt, ROM exercises for LUE (with instructions for no shoulder exercises until full sensation has returned), LUE precautions, adaptive strategies for bathing/dressing/toileting/grooming, positioning and considerations for sleep, pet care considerations, and home/routines modifications to maximize falls prevention, safety, and independence. Handout provided to support recall and carryover. OT adjusted sling and polar care to improve comfort, optimize positioning, and to maximize skin integrity/safety. Pt verbalized understanding of all education/training provided. Pt will benefit from skilled OT services to address these  limitations and improve independence in daily tasks. Recommend follow up therapy services per surgeon to continue therapy to maximize return to PLOF, address home/routines modifications and safety, minimize falls risk, and minimize caregiver burden.      Follow Up Recommendations  Follow surgeon's recommendation for DC plan and follow-up therapies    Equipment Recommendations  None recommended by OT    Recommendations for Other Services PT consult     Precautions / Restrictions Precautions Precautions: Fall;Shoulder Type of Shoulder Precautions: LUE NWBing, no shoulder ROM, elbow/wrist/hand ROM ok, sling Shoulder Interventions: Shoulder sling/immobilizer;At all times;Off for dressing/bathing/exercises Precaution Booklet Issued: Yes (comment) Restrictions Weight Bearing Restrictions: Yes LUE Weight Bearing: Non weight bearing      Mobility Bed Mobility Overal bed mobility: Needs Assistance Bed Mobility: Supine to Sit     Supine to sit: Supervision        Transfers Overall transfer level: Needs assistance Equipment used: None Transfers: Sit to/from Stand Sit to Stand: Min guard         General transfer comment: mildly unsteady, does better when he stands initially and does not attempt to move quickly    Balance Overall balance assessment: Needs assistance Sitting-balance support: No upper extremity supported;Feet supported Sitting balance-Leahy Scale: Good     Standing balance support: No upper extremity supported Standing balance-Leahy Scale: Fair Standing balance comment: mildly unsteady but able to self correct                           ADL either performed or assessed with clinical judgement   ADL Overall ADL's : Needs assistance/impaired  General ADL Comments: 2/2 LUE impairments and decr functional use, pt requires Min-Mod A for UB ADL, Min A for LB ADL, sup-CGA for functional transfers      Vision Baseline Vision/History: Wears glasses Wears Glasses: At all times Patient Visual Report: No change from baseline Vision Assessment?: No apparent visual deficits     Perception     Praxis      Pertinent Vitals/Pain Pain Assessment: No/denies pain     Hand Dominance Right   Extremity/Trunk Assessment Upper Extremity Assessment Upper Extremity Assessment: LUE deficits/detail(RUE WFL) LUE Deficits / Details: in sling, decr sensation to thumb, full sensation to rest of hand/fingers, pt reports block still  "mostly" in place LUE: Unable to fully assess due to immobilization LUE Sensation: decreased light touch;decreased proprioception LUE Coordination: decreased gross motor;decreased fine motor   Lower Extremity Assessment Lower Extremity Assessment: Overall WFL for tasks assessed;Defer to PT evaluation   Cervical / Trunk Assessment Cervical / Trunk Assessment: Normal   Communication Communication Communication: No difficulties   Cognition Arousal/Alertness: Awake/alert Behavior During Therapy: WFL for tasks assessed/performed Overall Cognitive Status: History of cognitive impairments - at baseline                                 General Comments: mild STM deficits noted and pt endorses from previous CVA, alert and oriented, follows commands well, mild decreased safety awareness   General Comments       Exercises Other Exercises Other Exercises: Pt instructed in shoulder precautions, sling mgt, hemi techniques for dressing/bathing, falls prevention strategies, pet care considerations, and positioning of LUE in sitting and for sleep; handout provided to support recall and carryover Other Exercises: Pt amb to bathroom with CGA and transferred to toilet with CGA   Shoulder Instructions      Home Living Family/patient expects to be discharged to:: Private residence Living Arrangements: Spouse/significant other Available Help at Discharge:  Family;Available 24 hours/day Type of Home: House Home Access: Stairs to enter [employer] of Steps: 3 Entrance Stairs-Rails: Right;Left Home Layout: Laundry or work area in basement;One level     Bathroom Shower/Tub: Teacher, early years/pre: Standard                Prior Functioning/Environment Level of Independence: Needs assistance  Gait / Transfers Assistance Needed: Indep with mobility, however endorses mild balance deficits and reports his wife tells him he needs to use a cane ADL's / Homemaking Assistance Needed: Indep with ADL, no longer drives, wife manages medications   Comments: Pt endorses several falls in past 12 months 2/2 poor balance        OT Problem List: Decreased strength;Decreased coordination;Decreased range of motion;Decreased cognition;Decreased safety awareness;Decreased knowledge of use of DME or AE;Impaired balance (sitting and/or standing);Impaired UE functional use;Decreased knowledge of precautions      OT Treatment/Interventions: Self-care/ADL training;Therapeutic exercise;Therapeutic activities;Cognitive remediation/compensation;DME and/or AE instruction;Patient/family education;Balance training    OT Goals(Current goals can be found in the care plan section) Acute Rehab OT Goals Patient Stated Goal: go home OT Goal Formulation: With patient Time For Goal Achievement: 09/26/19 Potential to Achieve Goals: Good ADL Goals Pt Will Perform Upper Body Dressing: with caregiver independent in assisting;sitting Pt Will Perform Lower Body Dressing: with caregiver independent in assisting;sit to/from stand Pt Will Transfer to Toilet: with supervision;ambulating;regular height toilet(LRAD for amb) Additional ADL Goal #1: Pt will independently instruct family/caregiver in shoulder sling mgt  OT Frequency: Min 2X/week   Barriers to D/C:            Co-evaluation              AM-PAC OT "6 Clicks" Daily Activity      Outcome Measure Help from another person eating meals?: A Little Help from another person taking care of personal grooming?: A Little Help from another person toileting, which includes using toliet, bedpan, or urinal?: A Little Help from another person bathing (including washing, rinsing, drying)?: A Little Help from another person to put on and taking off regular upper body clothing?: A Lot Help from another person to put on and taking off regular lower body clothing?: A Little 6 Click Score: 17   End of Session Equipment Utilized During Treatment: Gait belt Nurse Communication: Other (comment)(need PT order; pt in bathroom - let RN and nurse tech know)  Activity Tolerance: Patient tolerated treatment well Patient left: Other (comment)(in bathroom seated on toilet for BM w/ instructions to pull call bell when ready)  OT Visit Diagnosis: Other abnormalities of gait and mobility (R26.89);Repeated falls (R29.6)                Time: [id number] OT Time Calculation (min): 39 min Charges:  OT General Charges $OT Visit: 1 Visit OT Evaluation $OT Eval Low Complexity: 1 Low OT Treatments $Self Care/Home Management : 8-22 mins $Therapeutic Activity: 8-22 mins  Jeni Salles, MPH, MS, OTR/L ascom [phone] 09/12/19, 9:36 AM

## 2019-09-12 NOTE — TOC Transition Note (Signed)
Transition of Care [facility]) - CM/SW Discharge Note   Patient Details  Name: Stephen Stein MRN: [id number] Date of Birth: 23-Jun-1941  [facility] of [facility] CM/SW Contact:  Leslee Suire, Lenice Llamas Phone Number: [phone]  09/12/2019, 11:29 AM   Clinical Narrative: PT is recommending per surgeon. Per PT patient can do outpatient PT. Clinical Social Worker (CSW) contacted surgeon's office [place] and was told patient has an outpatient PT appointment scheduled for Wednesday 9/23 at 8:30 am at [employer] office. CSW met with patient and made him aware of above. Patient was alert and oriented X4 and was sitting up in the chair at bedside. CSW introduced self and explained role of CSW department. Patient is agreeable to outpatient PT and is aware of his appointment next week. Patient reported that he lives in [place] with his wife [name]. Per patient his wife will provide 24/7 supervision for him at home. Patient reported that his wife will get him a cane. Per PT there are no other DME needs. Patient reported no other needs or concerns. RN aware of above. CSW sent MD a secure message making him aware of above. Please reconsult if future social work needs arise. CSW signing off.      Final next level of care: OP Rehab Barriers to Discharge: Barriers Resolved   Patient Goals and CMS Choice Patient states their goals for this hospitalization and ongoing recovery are:: To go home.      Discharge Placement                       Discharge Plan and Services In-house Referral: Clinical Social Work              DME Arranged: N/A         HH Arranged: NA          Social Determinants of Health (SDOH) Interventions     Readmission Risk Interventions Readmission Risk Prevention Plan 08/18/2018  Post Dischage Appt Complete  Medication Screening Complete  Transportation Screening Complete  PCP follow-up Complete  Some recent data might be hidden

## 2019-09-12 NOTE — Discharge Summary (Signed)
Physician Discharge Summary  Patient ID: Stephen Stein MRN: [id number] DOB/AGE: 77-Sep-1942 77 y.o.  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 Biliary dyskinesia    a.08/2018 s/p lap chole.  [name] BPH (benign prostatic hyperplasia)   . Carotid atherosclerosis, bilateral 02/20/2019   Feb 2020  . Coronary artery calcification seen on CT scan    a. 11/2015 Cardiac CT: Cor Ca2+ = 892 (80th %'ile). Diffuse Ca2+ in all 3 major coronary arteries; b. 02/2016 MV: HTN response to exercise. Abnl ecg w/ horizontal inflat ST depression, but no ischemia/infarct on imaging; c. 09/2018 MV: EF 64%, no ischemia. Very sm region of mild distal antsept defect, likely 2/2 lbbb.  . Depression   . Hyperlipidemia   . Hypertension    For irregular heart beat  . Irregular heartbeat/Palpitations    a. Has been taking Atenolol for 20 years  . LBBB (left bundle branch block)   . Left ventricular hypokinesis    a. 09/2018 Echo: EF 45-50%, mod LVH, diff HK. Gr1 DD. Triv AI. Mildly dil Ao root. Mildly reduced RV fxn.  . Lightheadedness   . NSVT (nonsustained ventricular tachycardia) ([place])    a. 11/2018 Zio monitor - 13 beats of NSVT--asymptomatic.  . Orthostatic hypotension    a. 11/2018 pressures improved w/ midodrine/florinef but constant lightheadedness persists.  . Sinus bradycardia    a. 11/2018 Zio monitor: Avg HR 57 (min 28). Intermittent Mobitz I.    Surgeries: Procedure(s): TOTAL SHOULDER ARTHROPLASTY on 09/11/2019   Consultants (if any):   Discharged Condition: Improved  Hospital Course: Stephen Stein is an 78 y.o. 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    He was given perioperative antibiotics:  Anti-infectives (From admission, onward)   Start     Dose/Rate Route Frequency Ordered Stop   09/11/19 1400  ceFAZolin (ANCEF) IVPB 2g/100 mL premix     2 g 200 mL/hr over 30 Minutes Intravenous Every 6 hours 09/11/19 1209 09/12/19 0300   09/11/19 1040  50,000 units bacitracin in 0.9% normal saline 250 mL irrigation  Status:  Discontinued       As needed 09/11/19 1041 09/11/19 1058   09/11/19 0730  ceFAZolin (ANCEF) IVPB 2g/100 mL premix     2 g 200 mL/hr over 30 Minutes Intravenous On call to O.R. 09/11/19 0716 09/11/19 0900   09/11/19 0717  ceFAZolin (ANCEF) 2-4 GM/100ML-% IVPB    Note to Pharmacy: Lyman Bishop   : cabinet override      09/11/19 0717 09/11/19 0900    .  He was given sequential compression devices, early ambulation, and aspirin for DVT prophylaxis.  He benefited maximally from the hospital stay and there were no complications.    Recent vital signs:  Vitals:   09/12/19 0807 09/12/19 1216  BP: 110/62 120/61  Pulse: 63 74  Resp: 16   Temp: 97.6 F (36.4 C) 98 F (36.7 C)  SpO2: 97% 99%    Recent laboratory studies:  Lab Results  Component Value Date  HGB 11.7 (L) 09/12/2019   HGB 13.9 09/06/2019   HGB 14.8 08/19/2018   Lab Results  Component Value Date   WBC 7.5 09/06/2019   PLT 230 09/06/2019   Lab Results  Component Value Date   INR 1.1 09/06/2019   Lab Results  Component Value Date   NA 138 09/06/2019   K 4.3 09/06/2019   CL 105 09/06/2019   CO2 24 09/06/2019   BUN 19 09/06/2019   CREATININE 0.89 09/06/2019   GLUCOSE 101 (H) 09/06/2019    Discharge Medications:   Allergies as of 09/12/2019   No Known Allergies     Medication List    TAKE these medications   acetaminophen 500 MG tablet Commonly known as:  TYLENOL Take 1,000 mg by mouth every 6 (six) hours as needed for moderate pain.   aspirin 81 MG chewable tablet Chew 81 mg by mouth daily.   clonazePAM 0.5 MG tablet Commonly known as: KLONOPIN Take 0.5 tablets (0.25 mg total) by mouth 2 (two) times daily as needed (dizziness). 50 tabs to last 90 days   donepezil 5 MG tablet Commonly known as: ARICEPT Take 5 mg by mouth at bedtime.   glucosamine-chondroitin 500-400 MG tablet Take 1 tablet by mouth daily.   HYDROcodone-acetaminophen 5-325 MG tablet Commonly known as: NORCO/VICODIN Take 1 tablet by mouth every 4 (four) hours as needed for moderate pain (pain score 4-6).   meloxicam 7.5 MG tablet Commonly known as: MOBIC Take 0.5-1 tablets (3.75-7.5 mg total) by mouth daily as needed for pain. What changed: additional instructions   multivitamin with minerals Tabs tablet Take 1 tablet by mouth daily.   rosuvastatin 10 MG tablet Commonly known as: CRESTOR TAKE 1 TABLET BY MOUTH EVERY DAY What changed: when to take this   saw palmetto 160 MG capsule Take 160 mg by mouth daily.   sertraline 50 MG tablet Commonly known as: ZOLOFT Take 50 mg by mouth at bedtime.   vitamin B-12 1000 MCG tablet Commonly known as: CYANOCOBALAMIN Take 1,000 mcg by mouth daily.   vitamin C 500 MG tablet Commonly known as: ASCORBIC ACID Take 500 mg by mouth daily.       Diagnostic Studies: Ct Shoulder Left Wo Contrast  Result Date: 08/14/2019 CLINICAL DATA:  Severe osteoarthritis of the left glenohumeral joint. Pain and limited range of motion. Pre operative evaluation. EXAM: CT OF THE UPPER LEFT EXTREMITY WITHOUT CONTRAST TECHNIQUE: Multidetector CT imaging of the upper left extremity was performed according to the standard protocol. COMPARISON:  Radiographs dated 12/14/2015 FINDINGS: Bones/Joint/Cartilage There are severe osteoarthritic changes of the glenohumeral joint. Marked joint space narrowing. Extensive marginal osteophytes on the  humeral head as well as on the glenoid. Multiple subcortical cysts in the humeral head and glenoid. Slight AC joint arthropathy. Muscles and Tendons No atrophy of the muscles of the rotator cuff. Soft tissues Normal. Note is made of coronary artery calcifications and slight aortic atherosclerosis. IMPRESSION: Severe osteoarthritic changes of the glenohumeral joint. Electronically Signed   By: Lorriane Shire M.D.   On: 08/14/2019 11:46   Dg Shoulder Left Port  Result Date: 09/11/2019 CLINICAL DATA:  Status post total shoulder arthroplasty on the left. EXAM: LEFT SHOULDER - 1 VIEW COMPARISON:  12/14/2015 FINDINGS: The patient has undergone total shoulder arthroplasty on the left. There are overlying skin staples and expected postsurgical changes including subcutaneous gas and soft tissue edema. The alignment appears to be near anatomic. There is no periprosthetic fracture. IMPRESSION: Status post left total  shoulder arthroplasty without complicating features. Electronically Signed   By: Constance Holster M.D.   On: 09/11/2019 11:53   [place] Or Nerve Block-image Only ([facility])  Result Date: 09/11/2019 There is no interpretation for this exam.  This order is for images obtained during a surgical procedure.  Please See "Surgeries" Tab for more information regarding the procedure.    Disposition: Discharge disposition: 01-Home or Self Care            Signed: Lovell Sheehan ,MD 09/12/2019, 12:45 PM

## 2019-09-18 DIAGNOSIS — R2689 Other abnormalities of gait and mobility: Secondary | ICD-10-CM | POA: Diagnosis not present

## 2019-09-18 DIAGNOSIS — M25512 Pain in left shoulder: Secondary | ICD-10-CM | POA: Diagnosis not present

## 2019-09-18 DIAGNOSIS — M25612 Stiffness of left shoulder, not elsewhere classified: Secondary | ICD-10-CM | POA: Diagnosis not present

## 2019-09-18 DIAGNOSIS — R42 Dizziness and giddiness: Secondary | ICD-10-CM | POA: Diagnosis not present

## 2019-09-18 DIAGNOSIS — R4189 Other symptoms and signs involving cognitive functions and awareness: Secondary | ICD-10-CM | POA: Diagnosis not present

## 2019-09-20 DIAGNOSIS — M25612 Stiffness of left shoulder, not elsewhere classified: Secondary | ICD-10-CM | POA: Diagnosis not present

## 2019-09-20 DIAGNOSIS — M25512 Pain in left shoulder: Secondary | ICD-10-CM | POA: Diagnosis not present

## 2019-09-20 DIAGNOSIS — Z09 Encounter for follow-up examination after completed treatment for conditions other than malignant neoplasm: Secondary | ICD-10-CM | POA: Diagnosis not present

## 2019-09-24 DIAGNOSIS — M25512 Pain in left shoulder: Secondary | ICD-10-CM | POA: Diagnosis not present

## 2019-09-24 DIAGNOSIS — M25612 Stiffness of left shoulder, not elsewhere classified: Secondary | ICD-10-CM | POA: Diagnosis not present

## 2019-09-27 DIAGNOSIS — M25612 Stiffness of left shoulder, not elsewhere classified: Secondary | ICD-10-CM | POA: Diagnosis not present

## 2019-09-27 DIAGNOSIS — M25512 Pain in left shoulder: Secondary | ICD-10-CM | POA: Diagnosis not present

## 2019-10-01 DIAGNOSIS — M25612 Stiffness of left shoulder, not elsewhere classified: Secondary | ICD-10-CM | POA: Diagnosis not present

## 2019-10-01 DIAGNOSIS — M25512 Pain in left shoulder: Secondary | ICD-10-CM | POA: Diagnosis not present

## 2019-10-03 DIAGNOSIS — M25612 Stiffness of left shoulder, not elsewhere classified: Secondary | ICD-10-CM | POA: Diagnosis not present

## 2019-10-03 DIAGNOSIS — M25512 Pain in left shoulder: Secondary | ICD-10-CM | POA: Diagnosis not present

## 2019-10-08 DIAGNOSIS — M25512 Pain in left shoulder: Secondary | ICD-10-CM | POA: Diagnosis not present

## 2019-10-08 DIAGNOSIS — M25612 Stiffness of left shoulder, not elsewhere classified: Secondary | ICD-10-CM | POA: Diagnosis not present

## 2019-10-11 DIAGNOSIS — M25612 Stiffness of left shoulder, not elsewhere classified: Secondary | ICD-10-CM | POA: Diagnosis not present

## 2019-10-11 DIAGNOSIS — M25512 Pain in left shoulder: Secondary | ICD-10-CM | POA: Diagnosis not present

## 2019-10-16 DIAGNOSIS — M25512 Pain in left shoulder: Secondary | ICD-10-CM | POA: Diagnosis not present

## 2019-10-16 DIAGNOSIS — M25612 Stiffness of left shoulder, not elsewhere classified: Secondary | ICD-10-CM | POA: Diagnosis not present

## 2019-10-24 ENCOUNTER — Telehealth: Payer: Self-pay | Admitting: Family Medicine

## 2019-10-24 DIAGNOSIS — M25512 Pain in left shoulder: Secondary | ICD-10-CM | POA: Diagnosis not present

## 2019-10-24 DIAGNOSIS — Z96612 Presence of left artificial shoulder joint: Secondary | ICD-10-CM | POA: Diagnosis not present

## 2019-10-24 DIAGNOSIS — M25612 Stiffness of left shoulder, not elsewhere classified: Secondary | ICD-10-CM | POA: Diagnosis not present

## 2019-10-24 NOTE — Chronic Care Management (AMB) (Signed)
°  Chronic Care Management   Outreach Note  10/24/2019 Name: Stephen Stein MRN: [id number] DOB: Aug 01, 1941  Referred by: Delsa Grana, PA-C Reason for referral : Chronic Care Management (Initial CCM outreach was unsuccessful. )   An unsuccessful telephone outreach was attempted today. The patient was referred to the case management team by for assistance with care management and care coordination.   Follow Up Plan: A HIPPA compliant phone message was left for the patient providing contact information and requesting a return call.  The care management team will reach out to the patient again over the next 7 days.  If patient returns call to provider office, please advise to call [place] at [place]  [email or web address]   ??[phone]

## 2019-10-28 NOTE — Chronic Care Management (AMB) (Signed)
°  Chronic Care Management   Outreach Note  10/28/2019 Name: Stephen Stein MRN: [id number] DOB: 20-Jan-1941  Referred by: Delsa Grana, PA-C Reason for referral : Chronic Care Management (Initial CCM outreach was unsuccessful. ) and Chronic Care Management (Second CCM outreach was unsuccessful. )   A second unsuccessful telephone outreach was attempted today. The patient was referred to the case management team for assistance with care management and care coordination.   Follow Up Plan: A HIPPA compliant phone message was left for the patient providing contact information and requesting a return call.  The care management team will reach out to the patient again over the next 7 days.  If patient returns call to provider office, please advise to call [place]  at [place]  [email or web address]   ??[phone]

## 2019-10-31 DIAGNOSIS — M25512 Pain in left shoulder: Secondary | ICD-10-CM | POA: Diagnosis not present

## 2019-10-31 DIAGNOSIS — M25612 Stiffness of left shoulder, not elsewhere classified: Secondary | ICD-10-CM | POA: Diagnosis not present

## 2019-11-07 DIAGNOSIS — M25512 Pain in left shoulder: Secondary | ICD-10-CM | POA: Diagnosis not present

## 2019-11-07 DIAGNOSIS — M25612 Stiffness of left shoulder, not elsewhere classified: Secondary | ICD-10-CM | POA: Diagnosis not present

## 2019-11-11 NOTE — Chronic Care Management (AMB) (Signed)
Chronic Care Management   Note  11/11/2019 Name: Stephen Stein MRN: [id number] DOB: Aug 30, 1941  Stephen Stein is a 78 y.o. year old male who is a primary care patient of Delsa Grana, [place]. I reached out to Stephen Stein by phone today in response to a referral sent by [name]'s health plan.     [name] was given information about Chronic Care Management services today including:  1. CCM service includes personalized support from designated clinical staff supervised by his physician, including individualized plan of care and coordination with other care providers 2. 24/7 contact phone numbers for assistance for urgent and routine care needs. 3. Service will only be billed when office clinical staff spend 20 minutes or more in a month to coordinate care. 4. Only one practitioner may furnish and bill the service in a calendar month. 5. The patient may stop CCM services at any time (effective at the end of the month) by phone call to the office staff. 6. The patient will be responsible for cost sharing (co-pay) of up to 20% of the service fee (after annual deductible is met).  Patient did not agree to services and wishes to discuss with Delsa Grana PA first before deciding about enrollment in care management services.   Follow up plan: The care management team is available to follow up with the patient after provider conversation with the patient regarding recommendation for care management engagement and subsequent re-referral to the care management team.   [place], [place], [place] [address] Direct Dial: [email or web address]  Website: [email or web address]

## 2019-11-12 DIAGNOSIS — M25512 Pain in left shoulder: Secondary | ICD-10-CM | POA: Diagnosis not present

## 2019-11-12 DIAGNOSIS — M25612 Stiffness of left shoulder, not elsewhere classified: Secondary | ICD-10-CM | POA: Diagnosis not present

## 2019-11-14 DIAGNOSIS — M25612 Stiffness of left shoulder, not elsewhere classified: Secondary | ICD-10-CM | POA: Diagnosis not present

## 2019-11-14 DIAGNOSIS — M25512 Pain in left shoulder: Secondary | ICD-10-CM | POA: Diagnosis not present

## 2019-11-20 DIAGNOSIS — M25612 Stiffness of left shoulder, not elsewhere classified: Secondary | ICD-10-CM | POA: Diagnosis not present

## 2019-11-20 DIAGNOSIS — M25512 Pain in left shoulder: Secondary | ICD-10-CM | POA: Diagnosis not present

## 2019-11-25 ENCOUNTER — Ambulatory Visit (INDEPENDENT_AMBULATORY_CARE_PROVIDER_SITE_OTHER): Payer: PPO | Admitting: Family Medicine

## 2019-11-25 ENCOUNTER — Encounter: Payer: Self-pay | Admitting: Family Medicine

## 2019-11-25 ENCOUNTER — Other Ambulatory Visit: Payer: Self-pay

## 2019-11-25 VITALS — BP 126/80 | HR 93 | Temp 98.1°F | Resp 14 | Ht 66.0 in | Wt 198.3 lb

## 2019-11-25 DIAGNOSIS — E43 Unspecified severe protein-calorie malnutrition: Secondary | ICD-10-CM | POA: Diagnosis not present

## 2019-11-25 DIAGNOSIS — E782 Mixed hyperlipidemia: Secondary | ICD-10-CM

## 2019-11-25 DIAGNOSIS — E538 Deficiency of other specified B group vitamins: Secondary | ICD-10-CM | POA: Diagnosis not present

## 2019-11-25 DIAGNOSIS — D649 Anemia, unspecified: Secondary | ICD-10-CM | POA: Diagnosis not present

## 2019-11-25 DIAGNOSIS — R202 Paresthesia of skin: Secondary | ICD-10-CM

## 2019-11-25 MED ORDER — TAMSULOSIN HCL 0.4 MG PO CAPS: 0.4000 mg | ORAL_CAPSULE | Freq: Every day | ORAL | 3 refills | Status: DC

## 2019-11-25 MED ORDER — ROSUVASTATIN CALCIUM 10 MG PO TABS: 10.0000 mg | ORAL_TABLET | Freq: Every day | ORAL | 1 refills | Status: DC

## 2019-11-25 NOTE — Progress Notes (Signed)
Name: Stephen Stein   MRN: [id number]    DOB: Dec 06, 1941   Date:11/25/2019       Progress Note  Chief Complaint  Patient presents with  . Follow-up  . Medication Refill  . Hyperlipidemia     Subjective:   Stephen Stein is a 78 y.o. male, presents to clinic for routine follow up on the conditions listed above.  Cardiology - sees [place], yearly   Neuro - Dr. Manuella Ghazi for dizziness, depression, memory and cognitive changes he is on Aricept 5 mg daily and also has been encouraged to take B12 supplement  Hyperlipidemia: Current Medication Regimen:  crestor 10 mg  Last Lipids: Lab Results  Component Value Date   CHOL 135 08/26/2019   HDL 41 08/26/2019   LDLCALC 73 08/26/2019   TRIG 133 08/26/2019   CHOLHDL 3.3 08/26/2019   - Current Diet: healthy diet - Denies: Chest pain, shortness of breath, myalgias. - Documented aortic atherosclerosis? Yes - Risk factors for atherosclerosis: hypercholesterolemia   Neuropathy - b/l LE foot pain tingling, he has good sensation, feels stiff, he denies any color change skin or hair changes denies any pallor, also denies swelling Per last Neuro eval note -  1. 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 Some of his gait impairment, ataxia was concerning for parkinsonism, last visit his orthostatic hypotension was stable and he was encouraged to continue oral supplements for low B12   Patient Active Problem List   Diagnosis Date Noted  . Status post total shoulder replacement, left 09/11/2019  . Carotid atherosclerosis, bilateral 02/20/2019  . Vitamin B12 deficiency 10/15/2018  . Memory impairment 10/12/2018  .  Hx of major depression 10/12/2018  . Protein-calorie malnutrition, severe 08/20/2018  . Left bundle branch block 06/25/2018  . Second degree AV block, Mobitz type I 06/25/2018  . Aortic calcification ([place]) 06/15/2018  . Adrenal adenoma 05/09/2017  . Pulmonary nodules 04/19/2017  . BPH (benign prostatic hyperplasia) 04/19/2017  . 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 CAD (coronary artery disease) 03/25/2016  . Hyperlipidemia 07/07/2015  . Essential hypertension 07/07/2015    Past Surgical History:  Procedure Laterality Date  . CHOLECYSTECTOMY N/A 09/13/2018   Procedure: LAPAROSCOPIC CHOLECYSTECTOMY;  Surgeon: Jules Husbands, MD;  Location: [facility] ORS;  Service: General;  Laterality: N/A;  . COLONOSCOPY  2008  . ESOPHAGOGASTRODUODENOSCOPY (EGD) WITH PROPOFOL N/A 08/20/2018   Procedure: ESOPHAGOGASTRODUODENOSCOPY (EGD) WITH PROPOFOL;  Surgeon: Lucilla Lame, MD;  Location: [facility] ENDOSCOPY;  Service: Endoscopy;  Laterality: N/A;  . TONSILLECTOMY AND ADENOIDECTOMY  1955  . TOTAL SHOULDER ARTHROPLASTY Left 09/11/2019   Procedure: TOTAL SHOULDER ARTHROPLASTY;  Surgeon: Lovell Sheehan, MD;  Location: [facility] ORS;  Service: Orthopedics;  Laterality: Left;    Family History  Problem Relation Age of Onset  . Cancer Mother        lung  . Heart disease Father   . Hearing loss Sister        coclear implant  . Hearing loss Brother   . Cancer Paternal Grandmother   . Cancer Son        colon cancer  . Colon cancer  Neg Hx   . Stomach cancer Neg Hx     Social History   Socioeconomic History  . Marital status: Married    Spouse name: Not on file  . Number of children: 2  . Years of education: Not on file  . Highest education level: Bachelor's degree (e.g., BA, AB, BS)  Occupational History  . Not on file  Social Needs  . Financial resource strain: Not hard at all  . Food insecurity    Worry: Never true    Inability: Never true  . Transportation needs    Medical: No     Non-medical: No  Tobacco Use  . Smoking status: Former Smoker    Quit date: 06/22/1992    Years since quitting: 27.4  . Smokeless tobacco: Former Systems developer    Quit date: 06/22/1992  Substance and Sexual Activity  . Alcohol use: Yes    Alcohol/week: 1.0 standard drinks    Types: 1 Cans of beer per week    Comment: occasional  . Drug use: No  . Sexual activity: Not Currently  Lifestyle  . Physical activity    Days per week: 5 days    Minutes per session: 30 min  . Stress: Only a little  Relationships  . Social connections    Talks on phone: More than three times a week    Gets together: Three times a week    Attends religious service: More than 4 times per year    Active member of club or organization: No    Attends meetings of clubs or organizations: Never    Relationship status: Married  . Intimate partner violence    Fear of current or ex partner: No    Emotionally abused: No    Physically abused: No    Forced sexual activity: No  Other Topics Concern  . Not on file  Social History Narrative  . Not on file     Current Outpatient Medications:  .  acetaminophen (TYLENOL) 500 MG tablet, Take 1,000 mg by mouth every 6 (six) hours as needed for moderate pain., Disp: , Rfl:  .  aspirin 81 MG chewable tablet, Chew 81 mg by mouth daily., Disp: , Rfl:  .  donepezil (ARICEPT) 5 MG tablet, Take 5 mg by mouth at bedtime. , Disp: , Rfl:  .  glucosamine-chondroitin 500-400 MG tablet, Take 1 tablet by mouth daily., Disp: , Rfl:  .  Multiple Vitamin (MULTIVITAMIN WITH MINERALS) TABS tablet, Take 1 tablet by mouth daily., Disp: , Rfl:  .  rosuvastatin (CRESTOR) 10 MG tablet, TAKE 1 TABLET BY MOUTH EVERY DAY (Patient taking differently: Take 10 mg by mouth at bedtime. ), Disp: 90 tablet, Rfl: 1 .  saw palmetto 160 MG capsule, Take 160 mg by mouth daily. , Disp: , Rfl:  .  sertraline (ZOLOFT) 50 MG tablet, Take 50 mg by mouth at bedtime. , Disp: , Rfl:  .  vitamin B-12 (CYANOCOBALAMIN) 1000  MCG tablet, Take 1,000 mcg by mouth daily. , Disp: , Rfl:  .  vitamin C (ASCORBIC ACID) 500 MG tablet, Take 500 mg by mouth daily., Disp: , Rfl:  .  meloxicam (MOBIC) 7.5 MG tablet, Take 0.5-1 tablets (3.75-7.5 mg total) by mouth daily as needed for pain. (Patient not taking: Reported on 11/25/2019), Disp: , Rfl:   No Known Allergies  I personally reviewed active problem list, medication list, allergies, family history, social history, health maintenance, notes from last encounter, lab results, imaging with the patient/caregiver  today.  Review of Systems  Constitutional: Negative.   HENT: Negative.   Eyes: Negative.   Respiratory: Negative.   Cardiovascular: Negative.   Gastrointestinal: Negative.   Endocrine: Negative.   Genitourinary: Negative.   Musculoskeletal: Negative.   Skin: Negative.   Allergic/Immunologic: Negative.   Neurological: Negative.   Hematological: Negative.   Psychiatric/Behavioral: Negative.   All other systems reviewed and are negative.    Objective:    Vitals:   11/25/19 1337  BP: 126/80  Pulse: 93  Resp: 14  Temp: 98.1 F (36.7 C)  SpO2: 94%  Weight: 198 lb 4.8 oz (89.9 kg)  Height: 5\' 6"  (1.676 m)    Body mass index is 32.01 kg/m.  Physical Exam Vitals signs and nursing note reviewed.  Constitutional:      General: He is not in acute distress.    Appearance: Normal appearance. He is well-developed. He is not ill-appearing, toxic-appearing or diaphoretic.     Interventions: Face mask in place.  HENT:     Head: Normocephalic and atraumatic.     Jaw: No trismus.     Right Ear: External ear normal.     Left Ear: External ear normal.  Eyes:     General: Lids are normal. No scleral icterus.    Conjunctiva/sclera: Conjunctivae normal.     Pupils: Pupils are equal, round, and reactive to light.  Neck:     Musculoskeletal: Normal range of motion and neck supple.     Trachea: Trachea and phonation normal. No tracheal deviation.   Cardiovascular:     Rate and Rhythm: Normal rate and regular rhythm.     Pulses: Normal pulses.          Radial pulses are 2+ on the right side and 2+ on the left side.       Posterior tibial pulses are 2+ on the right side and 2+ on the left side.     Heart sounds: Normal heart sounds. No murmur. No friction rub. No gallop.   Pulmonary:     Effort: Pulmonary effort is normal. No respiratory distress.     Breath sounds: Normal breath sounds. No stridor. No wheezing, rhonchi or rales.  Abdominal:     General: Bowel sounds are normal. There is no distension.     Palpations: Abdomen is soft.     Tenderness: There is no abdominal tenderness. There is no guarding or rebound.  Musculoskeletal: Normal range of motion.     Right lower leg: No edema.     Left lower leg: No edema.  Skin:    General: Skin is warm and dry.     Capillary Refill: Capillary refill takes less than 2 seconds.     Coloration: Skin is not jaundiced.     Findings: No rash.     Nails: There is no clubbing.   Neurological:     Mental Status: He is alert. Mental status is at baseline.     Cranial Nerves: No dysarthria or facial asymmetry.     Motor: No tremor or abnormal muscle tone.     Comments: Baseline per wife who answers some questions for pt  Psychiatric:        Mood and Affect: Mood normal.        Speech: Speech normal.        Behavior: Behavior normal. Behavior is cooperative.     Comments: Pleasant mood      Recent Results (from the past 2160 hour(s))  APTT  Status: None   Collection Time: 09/06/19 11:07 AM  Result Value Ref Range   aPTT 36 24 - 36 seconds    Comment: Performed at [facility], [address] [place], [place] [id number]  Basic metabolic panel     Status: Abnormal   Collection Time: 09/06/19 11:07 AM  Result Value Ref Range   Sodium 138 135 - 145 mmol/L   Potassium 4.3 3.5 - 5.1 mmol/L   Chloride 105 98 - 111 mmol/L   CO2 24 22 - 32 mmol/L   Glucose, Bld 101 (H) 70 -  99 mg/dL   BUN 19 8 - 23 mg/dL   Creatinine, Ser 0.89 0.61 - 1.24 mg/dL   Calcium 9.5 8.9 - 10.3 mg/dL   GFR calc non Af Amer >60 >60 mL/min   GFR calc Af Amer >60 >60 mL/min   Anion gap 9 5 - 15    Comment: Performed at [facility], [address] [place], [place] [address]  CBC     Status: None   Collection Time: 09/06/19 11:07 AM  Result Value Ref Range   WBC 7.5 4.0 - 10.5 K/uL   RBC 4.30 4.22 - 5.81 MIL/uL   Hemoglobin 13.9 13.0 - 17.0 g/dL   HCT 40.6 39.0 - 52.0 %   MCV 94.4 80.0 - 100.0 fL   MCH 32.3 26.0 - 34.0 pg   MCHC 34.2 30.0 - 36.0 g/dL   RDW 12.4 11.5 - 15.5 %   Platelets 230 150 - 400 K/uL   nRBC 0.0 0.0 - 0.2 %    Comment: Performed at [facility], [address] [place], [place] [address]  Protime-INR     Status: None   Collection Time: 09/06/19 11:07 AM  Result Value Ref Range   Prothrombin Time 13.7 11.4 - 15.2 seconds   INR 1.1 0.8 - 1.2    Comment: (NOTE) INR goal varies based on device and disease states. Performed at [facility], [address] [place], [place] [address]   Urinalysis, Routine w reflex microscopic     Status: Abnormal   Collection Time: 09/06/19 11:07 AM  Result Value Ref Range   Color, Urine YELLOW (A) YELLOW   APPearance CLEAR (A) CLEAR   Specific Gravity, Urine 1.018 1.005 - 1.030   pH 6.0 5.0 - 8.0   Glucose, UA NEGATIVE NEGATIVE mg/dL   Hgb urine dipstick NEGATIVE NEGATIVE   Bilirubin Urine NEGATIVE NEGATIVE   Ketones, ur NEGATIVE NEGATIVE mg/dL   Protein, ur NEGATIVE NEGATIVE mg/dL   Nitrite NEGATIVE NEGATIVE   Leukocytes,Ua NEGATIVE NEGATIVE    Comment: Performed at [facility], [address], [place], [place] [address]  Surgical pcr screen     Status: None   Collection Time: 09/06/19 11:12 AM   Specimen: Nasal Mucosa; Nasal Swab  Result Value Ref Range   MRSA, PCR NEGATIVE NEGATIVE   Staphylococcus aureus NEGATIVE NEGATIVE    Comment: (NOTE) The Xpert SA Assay (FDA  approved for NASAL specimens in patients 70 years of age and older), is one component of a comprehensive surveillance program. It is not intended to diagnose infection nor to guide or monitor treatment. Performed at [facility], [place], [place] [address]   SARS CORONAVIRUS 2 (TAT 6-24 HRS) Nasopharyngeal Nasopharyngeal Swab     Status: None   Collection Time: 09/06/19  1:03 PM   Specimen: Nasopharyngeal Swab  Result Value Ref Range   SARS Coronavirus 2  NEGATIVE NEGATIVE    Comment: (NOTE) SARS-CoV-2 target nucleic acids are NOT DETECTED. The SARS-CoV-2 RNA is generally detectable in upper and lower respiratory specimens during the acute phase of infection. Negative results do not preclude SARS-CoV-2 infection, do not rule out co-infections with other pathogens, and should not be used as the sole basis for treatment or other patient management decisions. Negative results must be combined with clinical observations, patient history, and epidemiological information. The expected result is Negative. Fact Sheet for Patients: SugarRoll.be Fact Sheet for Healthcare Providers: https://www.woods-mathews.com/ This test is not yet approved or cleared by the [place] FDA and  has been authorized for detection and/or diagnosis of SARS-CoV-2 by FDA under an Emergency Use Authorization (EUA). This EUA will remain  in effect (meaning this test can be used) for the duration of the COVID-19 declaration under Section 56 4(b)(1) of the Act, 21 U.S.C. section 360bbb-3(b)(1), unless the authorization is terminated or revoked sooner. Performed at [facility], [place] [address], [place], [place] [address]   Hemoglobin and hematocrit, blood     Status: Abnormal   Collection Time: 09/12/19  4:05 AM  Result Value Ref Range   Hemoglobin 11.7 (L) 13.0 - 17.0 g/dL   HCT 33.9 (L) 39.0 - 52.0 %    Comment: Performed at [facility], [address] [place], [place] [address]    Diabetic Foot Exam: Diabetic Foot Exam - Simple   No data filed       PHQ2/9: Depression screen [facility] 2/9 11/25/2019 08/26/2019 07/04/2019 06/25/2019 02/04/2019  Decreased Interest 0 0 0 0 0  Down, Depressed, Hopeless 0 0 0 0 0  PHQ - 2 Score 0 0 0 0 0  Altered sleeping 0 0 - 0 0  Tired, decreased energy 0 0 - 0 0  Change in appetite 0 0 - 0 0  Feeling bad or failure about yourself  0 0 - 0 0  Trouble concentrating 0 0 - 0 0  Moving slowly or fidgety/restless 0 0 - 0 0  Suicidal thoughts 0 0 - 0 0  PHQ-9 Score 0 0 - 0 0  Difficult doing work/chores Not difficult at all Not difficult at all - Not difficult at all Not difficult at all  Some recent data might be hidden    phq 9 is negative, per Dr. Manuella Ghazi, on meds, mood good  Fall Risk: Fall Risk  11/25/2019 08/26/2019 07/04/2019 06/25/2019 04/11/2019  Falls in the past year? 1 1 1 1  0  Number falls in past yr: 1 1 1 1  -  Injury with Fall? 0 0 0 0 -  Comment - - - - -  Risk for fall due to : - - History of fall(s) - -  Follow up - - Falls prevention discussed - -      Functional Status Survey: Is the patient deaf or have difficulty hearing?: Yes Does the patient have difficulty seeing, even when wearing glasses/contacts?: Yes Does the patient have difficulty concentrating, remembering, or making decisions?: No Does the patient have difficulty walking or climbing stairs?: No Does the patient have difficulty dressing or bathing?: No Does the patient have difficulty doing errands alone such as visiting a doctor's office or shopping?: No    Assessment & Plan:   1. Protein-calorie malnutrition, severe ([place]) Patient reported history of weight loss however over the past several months he has been having stable weight even gaining some back - CMP w GFR  2. Mixed  hyperlipidemia Compliant with medication without any myalgias, check labs - rosuvastatin (CRESTOR) 10 MG tablet;  Take 1 tablet (10 mg total) by mouth at bedtime.  Dispense: 90 tablet; Refill: 1 - CMP w GFR - Lipid Panel  3. Anemia, unspecified type Some labs after surgery showed some anemia we will recheck, smear and differential with indices due to B12 deficiency - CBC +diff and smear  4. Vitamin B12 deficiency On oral supplement per neuro - Vitamin B12 - CBC +diff and smear  5. Paresthesia of lower extremity Rule out any iron deficiency if indices indicate, narrow is primarily handling his gait instability, paresthesias and cognitive decline - Vitamin B12 - CBC +diff and smear   Discussed with the patient and his wife that for his cholesterol medications would like to see him about twice a year to check his liver function and lipid panel  Delsa Grana, PA-C 11/25/19 1:53 PM

## 2019-11-26 DIAGNOSIS — M25612 Stiffness of left shoulder, not elsewhere classified: Secondary | ICD-10-CM | POA: Diagnosis not present

## 2019-11-26 DIAGNOSIS — M25512 Pain in left shoulder: Secondary | ICD-10-CM | POA: Diagnosis not present

## 2019-11-26 LAB — COMPLETE METABOLIC PANEL WITH GFR
AG Ratio: 1.5 (calc) (ref 1.0–2.5)
ALT: 21 U/L (ref 9–46)
AST: 21 U/L (ref 10–35)
Albumin: 4.3 g/dL (ref 3.6–5.1)
Alkaline phosphatase (APISO): 33 U/L — ABNORMAL LOW (ref 35–144)
BUN: 20 mg/dL (ref 7–25)
CO2: 27 mmol/L (ref 20–32)
Calcium: 10.1 mg/dL (ref 8.6–10.3)
Chloride: 102 mmol/L (ref 98–110)
Creat: 1.13 mg/dL (ref 0.70–1.18)
GFR, Est African American: 72 mL/min/{1.73_m2} (ref >=60)
GFR, Est Non African American: 62 mL/min/{1.73_m2} (ref >=60)
Globulin: 2.9 g/dL (calc) (ref 1.9–3.7)
Glucose, Bld: 92 mg/dL (ref 65–99)
Potassium: 4.7 mmol/L (ref 3.5–5.3)
Sodium: 139 mmol/L (ref 135–146)
Total Bilirubin: 0.3 mg/dL (ref 0.2–1.2)
Total Protein: 7.2 g/dL (ref 6.1–8.1)

## 2019-11-26 LAB — LIPID PANEL
Cholesterol: 142 mg/dL (ref <200)
HDL: 43 mg/dL (ref >=40)
LDL Cholesterol (Calc): 74 mg/dL (calc)
Non-HDL Cholesterol (Calc): 99 mg/dL (calc) (ref <130)
Total CHOL/HDL Ratio: 3.3 (calc) (ref <5.0)
Triglycerides: 168 mg/dL — ABNORMAL HIGH (ref <150)

## 2019-11-26 LAB — CBC (INCLUDES DIFF/PLT) WITH PATHOLOGIST REVIEW
Absolute Monocytes: 916 cells/uL (ref 200–950)
Basophils Absolute: 59 cells/uL (ref 0–200)
Basophils Relative: 0.7 %
Eosinophils Absolute: 521 cells/uL — ABNORMAL HIGH (ref 15–500)
Eosinophils Relative: 6.2 %
HCT: 41.7 % (ref 38.5–50.0)
Hemoglobin: 14.3 g/dL (ref 13.2–17.1)
Lymphs Abs: 2335 cells/uL (ref 850–3900)
MCH: 31.7 pg (ref 27.0–33.0)
MCHC: 34.3 g/dL (ref 32.0–36.0)
MCV: 92.5 fL (ref 80.0–100.0)
MPV: 9.9 fL (ref 7.5–12.5)
Monocytes Relative: 10.9 %
Neutro Abs: 4570 cells/uL (ref 1500–7800)
Neutrophils Relative %: 54.4 %
Platelets: 279 10*3/uL (ref 140–400)
RBC: 4.51 10*6/uL (ref 4.20–5.80)
RDW: 12.4 % (ref 11.0–15.0)
Total Lymphocyte: 27.8 %
WBC: 8.4 10*3/uL (ref 3.8–10.8)

## 2019-11-26 LAB — VITAMIN B12: Vitamin B-12: 1094 pg/mL (ref 200–1100)

## 2019-11-27 ENCOUNTER — Encounter: Payer: Self-pay | Admitting: Family Medicine

## 2019-11-28 DIAGNOSIS — M25612 Stiffness of left shoulder, not elsewhere classified: Secondary | ICD-10-CM | POA: Diagnosis not present

## 2019-11-28 DIAGNOSIS — M25512 Pain in left shoulder: Secondary | ICD-10-CM | POA: Diagnosis not present

## 2019-12-02 IMAGING — DX DG SHOULDER 1V*L*
3 series · 3 of 3 positions shown · non-contrast
Comparison: 12/14/2015

CLINICAL DATA: Status post total shoulder arthroplasty on the left.

EXAM:
LEFT SHOULDER - 1 VIEW

[shoulder ap]
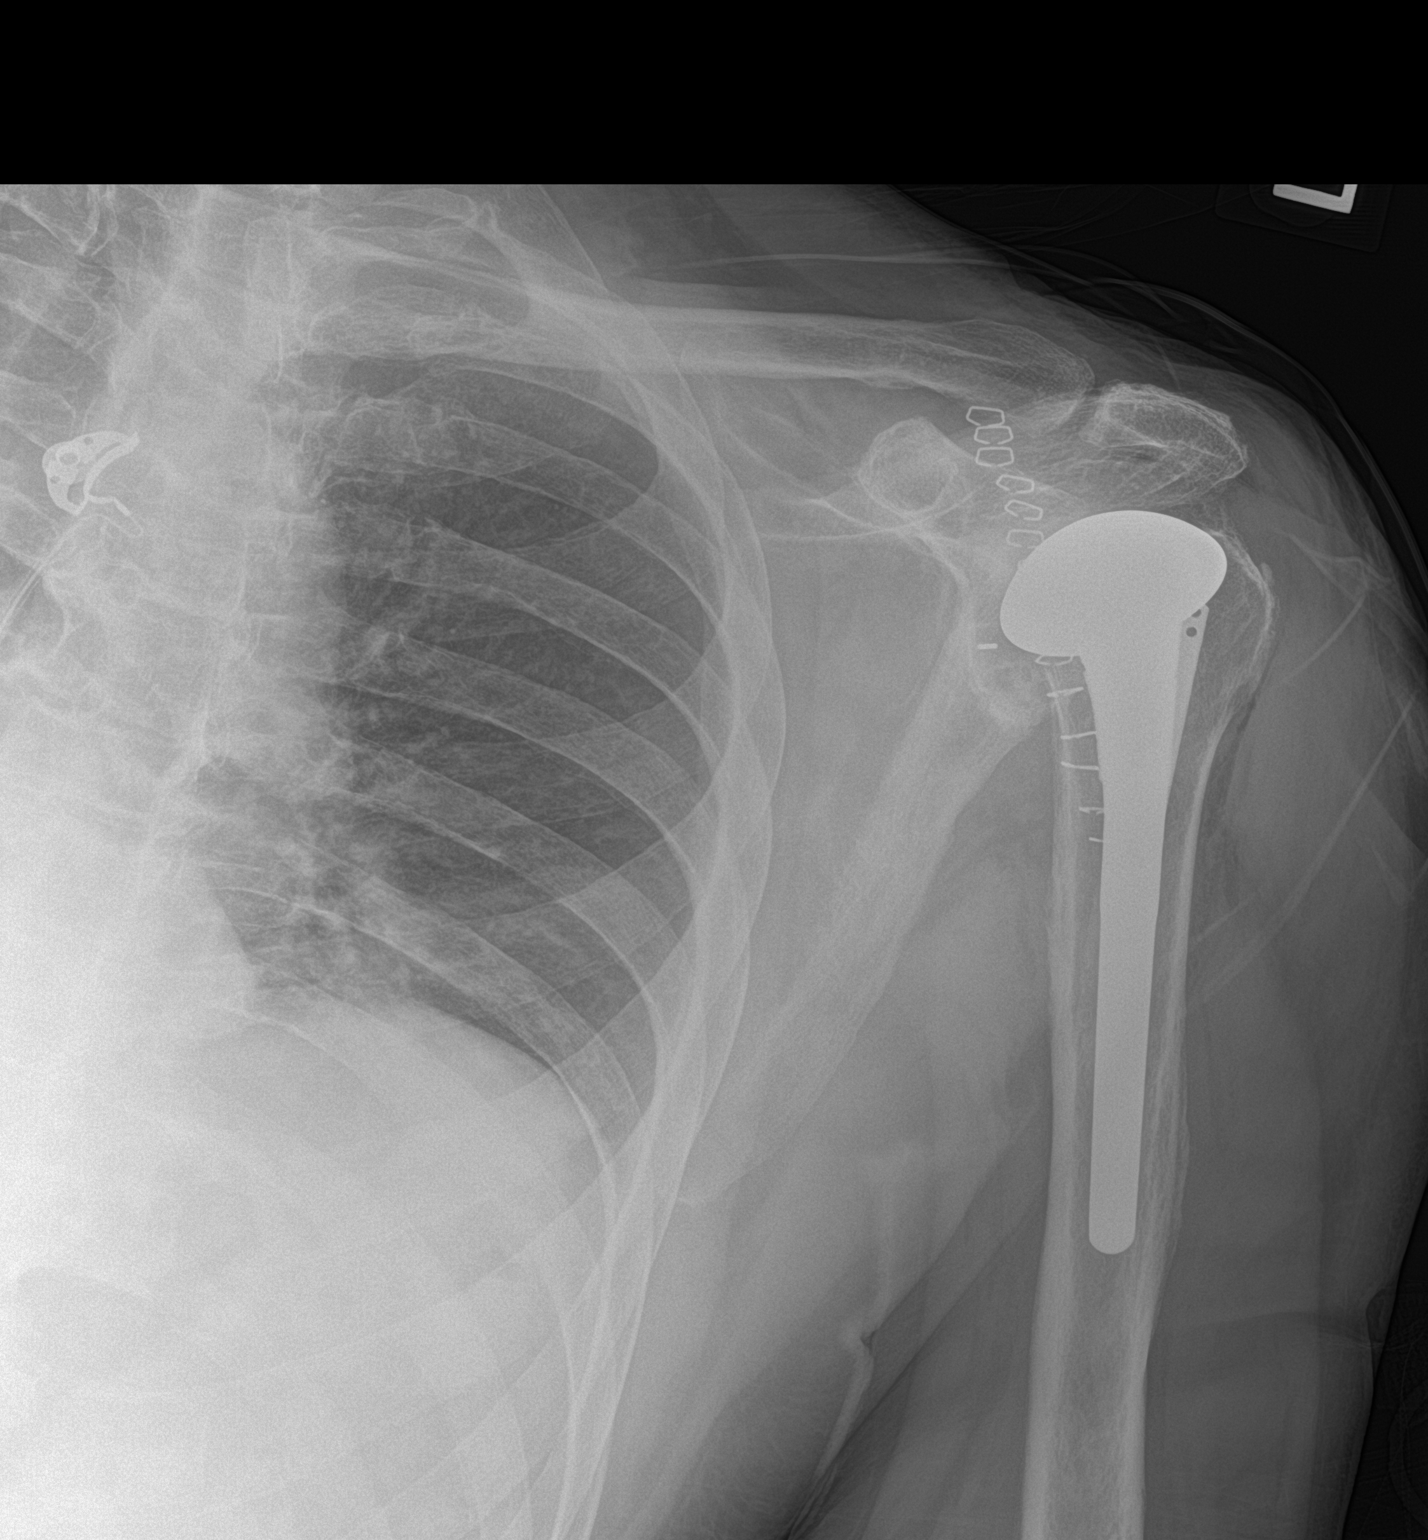

[shoulder obl]
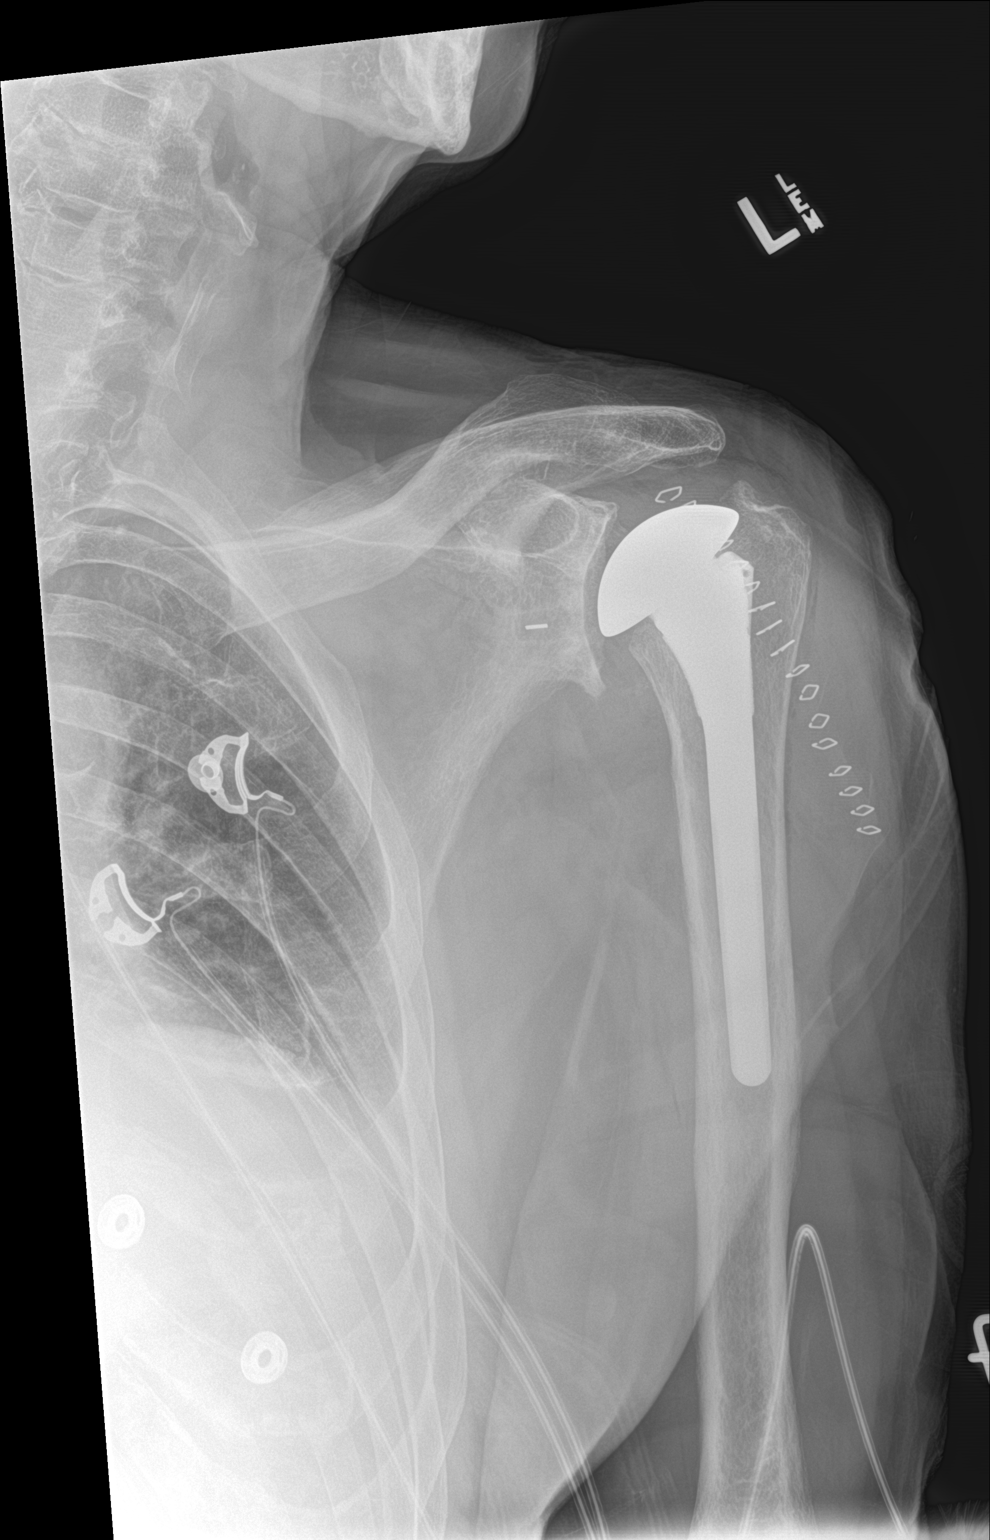

[shoulder axial]
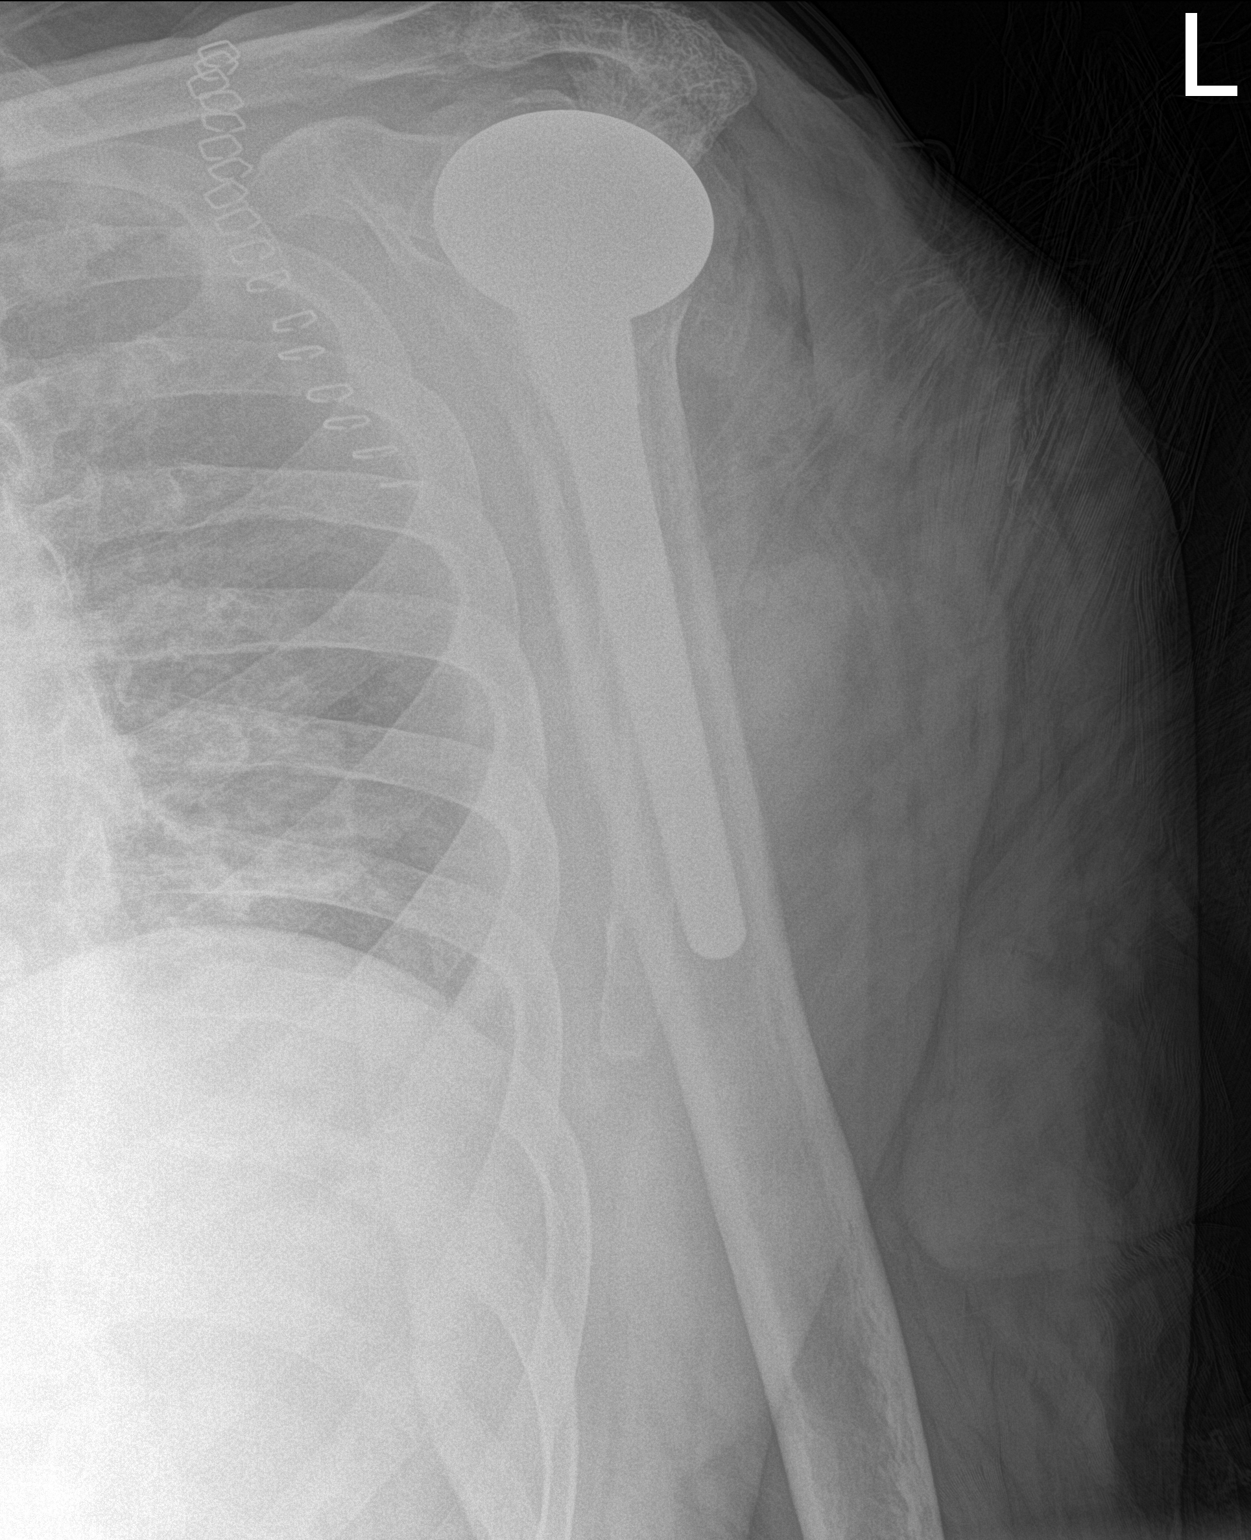

[3 of 3 positions shown; findings below may reference images not displayed]

FINDINGS: The patient has undergone total shoulder arthroplasty on the left.
There are overlying skin staples and expected postsurgical changes
including subcutaneous gas and soft tissue edema. The alignment
appears to be near anatomic. There is no periprosthetic fracture.
IMPRESSION: Status post left total shoulder arthroplasty without complicating
features.

## 2019-12-03 DIAGNOSIS — M25512 Pain in left shoulder: Secondary | ICD-10-CM | POA: Diagnosis not present

## 2019-12-03 DIAGNOSIS — M25612 Stiffness of left shoulder, not elsewhere classified: Secondary | ICD-10-CM | POA: Diagnosis not present

## 2019-12-11 DIAGNOSIS — S4350XA Sprain of unspecified acromioclavicular joint, initial encounter: Secondary | ICD-10-CM | POA: Diagnosis not present

## 2019-12-11 DIAGNOSIS — Z96612 Presence of left artificial shoulder joint: Secondary | ICD-10-CM | POA: Diagnosis not present

## 2020-02-16 ENCOUNTER — Other Ambulatory Visit: Payer: Self-pay | Admitting: Family Medicine

## 2020-02-20 ENCOUNTER — Other Ambulatory Visit: Payer: Self-pay

## 2020-02-20 ENCOUNTER — Other Ambulatory Visit
Admission: RE | Admit: 2020-02-20 | Discharge: 2020-02-20 | Disposition: A | Discharge status: Home / Self Care | Payer: PPO | Source: Ambulatory Visit | Attending: Pulmonary Disease | Admitting: Pulmonary Disease

## 2020-02-20 ENCOUNTER — Encounter: Payer: Self-pay | Admitting: Pulmonary Disease

## 2020-02-20 ENCOUNTER — Telehealth: Payer: Self-pay

## 2020-02-20 ENCOUNTER — Ambulatory Visit: Payer: PPO | Admitting: Pulmonary Disease

## 2020-02-20 VITALS — BP 108/68 | HR 70 | Temp 98.0°F | Ht 70.0 in | Wt 205.2 lb

## 2020-02-20 DIAGNOSIS — R05 Cough: Secondary | ICD-10-CM | POA: Insufficient documentation

## 2020-02-20 DIAGNOSIS — R059 Cough, unspecified: Secondary | ICD-10-CM

## 2020-02-20 DIAGNOSIS — J3089 Other allergic rhinitis: Secondary | ICD-10-CM

## 2020-02-20 LAB — CBC WITH DIFFERENTIAL/PLATELET
Abs Immature Granulocytes: 0.05 10*3/uL (ref 0.00–0.07)
Basophils Absolute: 0.1 10*3/uL (ref 0.0–0.1)
Basophils Relative: 1 %
Eosinophils Absolute: 0.5 10*3/uL (ref 0.0–0.5)
Eosinophils Relative: 6 %
HCT: 39.9 % (ref 39.0–52.0)
Hemoglobin: 13.7 g/dL (ref 13.0–17.0)
Immature Granulocytes: 1 %
Lymphocytes Relative: 26 %
Lymphs Abs: 2 10*3/uL (ref 0.7–4.0)
MCH: 32.1 pg (ref 26.0–34.0)
MCHC: 34.3 g/dL (ref 30.0–36.0)
MCV: 93.4 fL (ref 80.0–100.0)
Monocytes Absolute: 0.8 10*3/uL (ref 0.1–1.0)
Monocytes Relative: 11 %
Neutro Abs: 4.3 10*3/uL (ref 1.7–7.7)
Neutrophils Relative %: 55 %
Platelets: 211 10*3/uL (ref 150–400)
RBC: 4.27 MIL/uL (ref 4.22–5.81)
RDW: 12.6 % (ref 11.5–15.5)
WBC: 7.6 10*3/uL (ref 4.0–10.5)
nRBC: 0 % (ref 0.0–0.2)

## 2020-02-20 MED ORDER — AZELASTINE-FLUTICASONE 137-50 MCG/ACT NA SUSP: 1.0000 | Freq: Two times a day (BID) | NASAL | 3 refills | Status: DC

## 2020-02-20 MED ORDER — AZELASTINE HCL 0.1 % NA SOLN: 1.0000 | Freq: Two times a day (BID) | NASAL | 12 refills | Status: DC

## 2020-02-20 NOTE — Patient Instructions (Signed)
We will get some blood work to retest with regards to allergies.  Trial of Dymista (nasal spray) 1 spray to each nostril twice a day.  Follow-up in 4 to 6 weeks time.  Call sooner should any new difficulties arise.

## 2020-02-20 NOTE — Telephone Encounter (Signed)
Azelastine, one spray ea. Nostril BID

## 2020-02-20 NOTE — Progress Notes (Signed)
    Assessment & Plan:  1. Cough (Primary)  2. Perennial allergic rhinitis   Patient Instructions  We will get some blood work to retest with regards to allergies.  Trial of Dymista  (nasal spray) 1 spray to each nostril twice a day.  Follow-up in 4 to 6 weeks time.  Call sooner should any new difficulties arise.  Please note: late entry documentation due to logistical difficulties during COVID-19 pandemic. This note is filed for information purposes only, and is not intended to be used for billing, nor does it represent the full scope/nature of the visit in question. Please see any associated scanned media linked to date of encounter for additional pertinent information.  Subjective:    HPI: Stephen Stein is a 79 y.o. male presenting to the pulmonology clinic on 02/20/2020 with report of: Pulmonary Consult (Coughing up light yellow mucos for about a year. Pt denies any sob, wheezing, fever, chills, or sweats.)     Outpatient Encounter Medications as of 02/20/2020  Medication Sig   acetaminophen  (TYLENOL ) 500 MG tablet Take 1,000 mg by mouth every 6 (six) hours as needed for moderate pain.   [DISCONTINUED] aspirin  81 MG chewable tablet Chew 81 mg by mouth daily. (Patient not taking: Reported on 10/20/2020)   [DISCONTINUED] donepezil  (ARICEPT ) 5 MG tablet Take 5 mg by mouth at bedtime.  (Patient not taking: Reported on 10/20/2020)   [DISCONTINUED] glucosamine-chondroitin 500-400 MG tablet Take 1 tablet by mouth daily. (Patient not taking: Reported on 06/13/2022)   [DISCONTINUED] meloxicam  (MOBIC ) 7.5 MG tablet Take 0.5-1 tablets (3.75-7.5 mg total) by mouth daily as needed for pain. (Patient not taking: Reported on 06/21/2021)   [DISCONTINUED] Multiple Vitamin (MULTIVITAMIN WITH MINERALS) TABS tablet Take 1 tablet by mouth daily. (Patient not taking: Reported on 06/13/2022)   [DISCONTINUED] rosuvastatin  (CRESTOR ) 10 MG tablet Take 1 tablet (10 mg total) by mouth at bedtime.    [DISCONTINUED] saw palmetto 160 MG capsule Take 160 mg by mouth daily.    [DISCONTINUED] sertraline  (ZOLOFT ) 50 MG tablet Take 50 mg by mouth at bedtime.    [DISCONTINUED] tamsulosin  (FLOMAX ) 0.4 MG CAPS capsule Take 1 capsule (0.4 mg total) by mouth at bedtime.   [DISCONTINUED] vitamin B-12 (CYANOCOBALAMIN ) 1000 MCG tablet Take 1,000 mcg by mouth daily.  (Patient not taking: Reported on 10/21/2021)   [DISCONTINUED] vitamin C (ASCORBIC ACID) 500 MG tablet Take 500 mg by mouth daily. (Patient not taking: Reported on 06/21/2021)   [DISCONTINUED] Azelastine -Fluticasone  137-50 MCG/ACT SUSP Place 1 spray into the nose in the morning and at bedtime.   No facility-administered encounter medications on file as of 02/20/2020.      Objective:   Vitals:   02/20/20 0928  BP: 108/68  Pulse: 70  Temp: 98 F (36.7 C)  Height: 5' 10 (1.778 m)  Weight: 205 lb 3.2 oz (93.1 kg)  SpO2: 98% Comment: on ra  TempSrc: Temporal  BMI (Calculated): 29.44     Physical exam documentation is limited by delayed entry of information.

## 2020-02-20 NOTE — Telephone Encounter (Signed)
Pts insurance will not cover the dymista.The preferred alternatives are azelastine 0.1%/137MCG or fluticasone prop 1mcg. Please advise.

## 2020-02-22 LAB — ALLERGENS W/TOTAL IGE AREA 2
Alternaria Alternata IgE: 1.76 kU/L — AB
Aspergillus Fumigatus IgE: <0.1 kU/L
Bermuda Grass IgE: <0.1 kU/L
Cat Dander IgE: <0.1 kU/L
Cedar, Mountain IgE: <0.1 kU/L
Cladosporium Herbarum IgE: <0.1 kU/L
Cockroach, German IgE: <0.1 kU/L
Common Silver Birch IgE: <0.1 kU/L
Cottonwood IgE: <0.1 kU/L
D Farinae IgE: 0.89 kU/L — AB
D Pteronyssinus IgE: 1.01 kU/L — AB
Dog Dander IgE: <0.1 kU/L
Elm, American IgE: <0.1 kU/L
IgE (Immunoglobulin E), Serum: 302 IU/mL (ref 6–495)
Johnson Grass IgE: <0.1 kU/L
Maple/Box Elder IgE: <0.1 kU/L
Mouse Urine IgE: <0.1 kU/L
Oak, White IgE: <0.1 kU/L
Pecan, Hickory IgE: <0.1 kU/L
Penicillium Chrysogen IgE: 0.1 kU/L — AB
Pigweed, Rough IgE: <0.1 kU/L
Ragweed, Short IgE: 2.39 kU/L — AB
Sheep Sorrel IgE Qn: 0.12 kU/L — AB
Timothy Grass IgE: <0.1 kU/L
White Mulberry IgE: <0.1 kU/L

## 2020-03-09 DIAGNOSIS — S42002A Fracture of unspecified part of left clavicle, initial encounter for closed fracture: Secondary | ICD-10-CM | POA: Diagnosis not present

## 2020-03-09 DIAGNOSIS — S63295A Dislocation of distal interphalangeal joint of left ring finger, initial encounter: Secondary | ICD-10-CM | POA: Diagnosis not present

## 2020-03-09 DIAGNOSIS — Z96612 Presence of left artificial shoulder joint: Secondary | ICD-10-CM | POA: Diagnosis not present

## 2020-03-12 DIAGNOSIS — S63255A Unspecified dislocation of left ring finger, initial encounter: Secondary | ICD-10-CM | POA: Diagnosis not present

## 2020-03-18 ENCOUNTER — Other Ambulatory Visit: Payer: Self-pay | Admitting: Family Medicine

## 2020-03-19 DIAGNOSIS — R4189 Other symptoms and signs involving cognitive functions and awareness: Secondary | ICD-10-CM | POA: Diagnosis not present

## 2020-03-19 DIAGNOSIS — E538 Deficiency of other specified B group vitamins: Secondary | ICD-10-CM | POA: Diagnosis not present

## 2020-03-24 DIAGNOSIS — S63255D Unspecified dislocation of left ring finger, subsequent encounter: Secondary | ICD-10-CM | POA: Diagnosis not present

## 2020-03-24 DIAGNOSIS — Z96612 Presence of left artificial shoulder joint: Secondary | ICD-10-CM | POA: Diagnosis not present

## 2020-03-25 ENCOUNTER — Ambulatory Visit: Payer: PPO | Admitting: Primary Care

## 2020-04-09 ENCOUNTER — Other Ambulatory Visit: Payer: Self-pay | Admitting: Family Medicine

## 2020-04-24 ENCOUNTER — Ambulatory Visit (INDEPENDENT_AMBULATORY_CARE_PROVIDER_SITE_OTHER): Payer: PPO | Admitting: Nurse Practitioner

## 2020-04-24 ENCOUNTER — Other Ambulatory Visit: Payer: Self-pay

## 2020-04-24 ENCOUNTER — Encounter: Payer: Self-pay | Admitting: Nurse Practitioner

## 2020-04-24 VITALS — BP 110/66 | HR 64 | Ht 70.0 in | Wt 207.1 lb

## 2020-04-24 DIAGNOSIS — R42 Dizziness and giddiness: Secondary | ICD-10-CM

## 2020-04-24 DIAGNOSIS — I2510 Atherosclerotic heart disease of native coronary artery without angina pectoris: Secondary | ICD-10-CM

## 2020-04-24 DIAGNOSIS — I440 Atrioventricular block, first degree: Secondary | ICD-10-CM

## 2020-04-24 DIAGNOSIS — E782 Mixed hyperlipidemia: Secondary | ICD-10-CM | POA: Diagnosis not present

## 2020-04-24 DIAGNOSIS — R001 Bradycardia, unspecified: Secondary | ICD-10-CM | POA: Diagnosis not present

## 2020-04-24 NOTE — Patient Instructions (Signed)
Medication Instructions:  Your physician recommends that you continue on your current medications as directed. Please refer to the Current Medication list given to you today.  *If you need a refill on your cardiac medications before your next appointment, please call your pharmacy*   Lab Work: None ordered  If you have labs (blood work) drawn today and your tests are completely normal, you will receive your results only by: [name] MyChart Message (if you have MyChart) OR . A paper copy in the mail If you have any lab test that is abnormal or we need to change your treatment, we will call you to review the results.   Testing/Procedures: None ordered    Follow-Up: At [facility], you and your health needs are our priority.  As part of our continuing mission to provide you with exceptional heart care, we have created designated Provider Care Teams.  These Care Teams include your primary Cardiologist (physician) and Advanced Practice Providers (APPs -  Physician Assistants and Nurse Practitioners) who all work together to provide you with the care you need, when you need it.  We recommend signing up for the patient portal called "MyChart".  Sign up information is provided on this After Visit Summary.  MyChart is used to connect with patients for Virtual Visits (Telemedicine).  Patients are able to view lab/test results, encounter notes, upcoming appointments, etc.  Non-urgent messages can be sent to your provider as well.   To learn more about what you can do with MyChart, go to [email or web address].    Your next appointment:   12 month(s)  The format for your next appointment:   In Person  Provider:     You may see Ida Rogue, MD or Murray Hodgkins, NP  Christell Faith, PA-C  Marrianne Mood, PA-C    Other Instructions

## 2020-04-24 NOTE — Progress Notes (Signed)
Office Visit    Patient Name: Stephen Stein Date of Encounter: 04/24/2020  Primary Care Provider:  Delsa Grana, PA-C Primary Cardiologist:  Ida Rogue, MD  Chief Complaint    79 year old male with a history of hypertension, hyperlipidemia, palpitations, coronary artery calcification with low risk stress testing, BPH, confusion, biliary dyskinesia/cholecystitis status post cholecystectomy, and orthostatic hypotension with lightheadedness, who presents for follow-up related to lightheadedness.  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 08/2018 s/p lap chole.   BPH (benign prostatic hyperplasia)    Carotid atherosclerosis, bilateral    a. 01/2019 Carotid U/S: <50% bilat ICA stenosis.   Coronary artery calcification seen on CT scan    a. 11/2015 Cardiac CT: Cor Ca2+ = 892 (80th %'ile). Diffuse Ca2+ in all 3 major coronary arteries; b. 02/2016 MV: HTN response to exercise. Abnl ecg w/ horizontal inflat ST depression, but no ischemia/infarct on imaging; c. 09/2018 MV: EF 64%, no ischemia. Very sm region of mild distal antsept defect, likely 2/2 lbbb.   Depression    Hyperlipidemia    Hypertension    For irregular heart beat   Irregular heartbeat/Palpitations    a. Has been taking Atenolol for 20 years   LBBB (left bundle branch block)    Left ventricular hypokinesis    a. 09/2018 Echo: EF 45-50%, mod LVH, diff HK. Gr1 DD. Triv AI. Mildly dil Ao root. Mildly reduced RV fxn.   Lightheadedness    NSVT (nonsustained ventricular tachycardia) ([place])    a. 11/2018 Zio monitor - 13 beats of NSVT--asymptomatic.   Orthostatic hypotension    a. 11/2018 pressures improved w/ midodrine/florinef.   Sinus bradycardia    a. 11/2018 Zio monitor: Avg HR 57 (min 28). Intermittent Mobitz I.   Past Surgical History:  Procedure Laterality Date   CHOLECYSTECTOMY N/A  09/13/2018   Procedure: LAPAROSCOPIC CHOLECYSTECTOMY;  Surgeon: Jules Husbands, MD;  Location: [facility];  Service: General;  Laterality: N/A;   COLONOSCOPY  2008   ESOPHAGOGASTRODUODENOSCOPY (EGD) WITH PROPOFOL N/A 08/20/2018   Procedure: ESOPHAGOGASTRODUODENOSCOPY (EGD) WITH PROPOFOL;  Surgeon: Lucilla Lame, MD;  Location: [facility];  Service: Endoscopy;  Laterality: N/A;   TONSILLECTOMY AND ADENOIDECTOMY  1955   TOTAL SHOULDER ARTHROPLASTY Left 09/11/2019   Procedure: TOTAL SHOULDER ARTHROPLASTY;  Surgeon: Lovell Sheehan, MD;  Location: [facility];  Service: Orthopedics;  Laterality: Left;    Allergies  No Known Allergies  History of Present Illness    79 year old male with above past medical history including hypertension, hyperlipidemia, palpitations, BPH, coronary calcifications, orthostatic hypotension with lightheadedness, confusion, and biliary dyskinesia/cholecystitis status post cholecystectomy.  He was previously evaluated w/ coronary CT with calcium scoring in December 2016, revealing a calcium score of 892, placing him in the 80th percentile.  This was followed by stress testing March 2017 which was low risk and nonischemic.  In July 2019, he was noted to have left bundle branch block and Mobitz 1 heart block.  He was asymptomatic and hemodynamically stable.  Beta-blocker dose was reduced.  Echocardiogram in October 2019 showed slight reduction LV function with an EF of 45-50% with diffuse hypokinesis, grade 1 diastolic dysfunction, mild reduced RV function.  In the setting of mild LV dysfunction, stress testing was undertaken Lexiscan Myoview was nonischemic.  In the setting of orthostatic symptoms and lightheadedness, he was  placed on Florinef and subsequently midodrine.  Zi0 monitoring showed nocturnal bradycardia and 13 beats of nonsustained VT. midodrine was discontinued in the setting of restless legs.  As he reported constant lightheadedness regardless of positioning, blood  pressure, or heart rate, it became clear that symptoms were noncardiac.  He followed up with Dr. Caryl Comes January 2020 and Florinef was discontinued.  He was last seen via telemedicine visit in April 2020, at which time he reported improved lightheadedness on clonazepam and sertraline therapy.  Over the past year, he notes very stable symptoms and says that overall, he is doing quite well.  He still experiences lightheadedness and needs to steady himself after initially standing but his activity tolerance is much improved.  He will fall up to about twice a month due to unsteadiness, if he gets up too quickly.  He has not sustained any severe injuries but has been dealing with left shoulder pain status post prior replacement.  He denies chest pain, dyspnea, palpitations, PND, orthopnea, syncope, edema, or early satiety.  Home Medications    Prior to Admission medications   Medication Sig Start Date End Date Taking? Authorizing Provider  acetaminophen (TYLENOL) 500 MG tablet Take 1,000 mg by mouth every 6 (six) hours as needed for moderate pain.    [provider]  aspirin 81 MG chewable tablet Chew 81 mg by mouth daily.    [provider]  azelastine (ASTELIN) 0.1 % nasal spray Place 1 spray into both nostrils 2 (two) times daily. Use in each nostril as directed 02/20/20   Tyler Pita, MD  Azelastine-Fluticasone 137-50 MCG/ACT SUSP Place 1 spray into the nose in the morning and at bedtime. 02/20/20   Tyler Pita, MD  donepezil (ARICEPT) 5 MG tablet Take 5 mg by mouth at bedtime.  05/17/19   [provider]  glucosamine-chondroitin 500-400 MG tablet Take 1 tablet by mouth daily.    [provider]  meloxicam (MOBIC) 7.5 MG tablet Take 0.5-1 tablets (3.75-7.5 mg total) by mouth daily as needed for pain. 12/14/18   Arnetha Courser, MD  Multiple Vitamin (MULTIVITAMIN WITH MINERALS) TABS tablet Take 1 tablet by mouth daily.    [provider]    rosuvastatin (CRESTOR) 10 MG tablet Take 1 tablet (10 mg total) by mouth at bedtime. 11/25/19   Delsa Grana, PA-C  saw palmetto 160 MG capsule Take 160 mg by mouth daily.     [provider]  sertraline (ZOLOFT) 50 MG tablet Take 50 mg by mouth at bedtime.  01/09/19   [provider]  tamsulosin (FLOMAX) 0.4 MG CAPS capsule TAKE 1 CAPSULE BY MOUTH EVERYDAY AT BEDTIME 03/18/20   Delsa Grana, PA-C  vitamin B-12 (CYANOCOBALAMIN) 1000 MCG tablet Take 1,000 mcg by mouth daily.     [provider]  vitamin C (ASCORBIC ACID) 500 MG tablet Take 500 mg by mouth daily.    [provider]    Review of Systems    Chronic lightheadedness which is overall much improved.  He does fall up to 2 times per month but has not suffered any severe trauma.  He denies chest pain, palpitations, dyspnea, PND, orthopnea, syncope, edema, or early satiety.  All other systems reviewed and are otherwise negative except as noted above.  Physical Exam    VS:  BP 110/66 (BP Location: Left Arm, Patient Position: Sitting, Cuff Size: Large)    Pulse 64    Ht 5\' 10"  (1.778 m)    Wt  207 lb 2 oz (94 kg)    SpO2 98%    BMI 29.72 kg/m  , BMI Body mass index is 29.72 kg/m. GEN: Well nourished, well developed, in no acute distress. HEENT: normal. Neck: Supple, no JVD, carotid bruits, or masses. Cardiac: RRR, no murmurs, rubs, or gallops. No clubbing, cyanosis, edema.  Radials/PT 2+ and equal bilaterally.  Respiratory:  Respirations regular and unlabored, clear to auscultation bilaterally. GI: Soft, nontender, nondistended, BS + x 4. MS: no deformity or atrophy. Skin: warm and dry, no rash. Neuro:  Strength and sensation are intact. Psych: Normal affect.  Accessory Clinical Findings    ECG personally reviewed by me today -regular sinus rhythm, 64, first-degree AV block, left axis deviation, left bundle branch block- no acute changes.  Lab Results  Component Value Date   WBC 7.6 02/20/2020    HGB 13.7 02/20/2020   HCT 39.9 02/20/2020   MCV 93.4 02/20/2020   PLT 211 02/20/2020   Lab Results  Component Value Date   CREATININE 1.13 11/25/2019   BUN 20 11/25/2019   NA 139 11/25/2019   K 4.7 11/25/2019   CL 102 11/25/2019   CO2 27 11/25/2019   Lab Results  Component Value Date   ALT 21 11/25/2019   AST 21 11/25/2019   ALKPHOS 23 (L) 08/18/2018   BILITOT 0.3 11/25/2019   Lab Results  Component Value Date   CHOL 142 11/25/2019   HDL 43 11/25/2019   LDLCALC 74 11/25/2019   TRIG 168 (H) 11/25/2019   CHOLHDL 3.3 11/25/2019    Lab Results  Component Value Date   HGBA1C 5.8 (H) 08/07/2018    Assessment & Plan    1.  Lightheadedness: Patient with previous history of nonpositional lightheadedness that was persistent in nature in the absence of clear orthostasis or arrhythmias on prior event monitoring.  He did not previously respond to midodrine or Florinef.  He now follows with neurology closely and symptoms have been well managed on sertraline therapy.  He was prescribed Sinemet at one point but did not think that it helped very much.  He does sometimes fall if he gets up too quickly but both he and his wife point out that he just needs to slow down to remember to steady himself.  No further cardiac work-up warranted.  2.  Sinus bradycardia with first-degree AV block: No significant arrhythmias associated with lightheadedness on previous monitoring.  Heart rate/rhythm stable today.  Avoiding AV nodal blocking agents.  Patient reports very good energy and activity tolerance.  3.  Coronary artery calcification: Negative stress testing in late 2019.  He is on aspirin and statin therapy.  4.  Hyperlipidemia: LDL 70 30 October 2019 on statin therapy.    5.  Disposition: Patient is doing really well over the past year.  Follow-up in 1 year or sooner if necessary.   Murray Hodgkins, NP 04/24/2020, 10:22 AM

## 2020-05-13 ENCOUNTER — Other Ambulatory Visit: Payer: Self-pay | Admitting: Family Medicine

## 2020-05-13 DIAGNOSIS — E782 Mixed hyperlipidemia: Secondary | ICD-10-CM

## 2020-05-21 DIAGNOSIS — H2513 Age-related nuclear cataract, bilateral: Secondary | ICD-10-CM | POA: Diagnosis not present

## 2020-07-07 ENCOUNTER — Ambulatory Visit: Payer: PPO

## 2020-07-08 ENCOUNTER — Other Ambulatory Visit: Payer: Self-pay | Admitting: Family Medicine

## 2020-07-29 ENCOUNTER — Other Ambulatory Visit: Payer: Self-pay

## 2020-07-29 ENCOUNTER — Ambulatory Visit: Payer: PPO | Admitting: Dermatology

## 2020-07-29 ENCOUNTER — Encounter: Payer: Self-pay | Admitting: Dermatology

## 2020-07-29 DIAGNOSIS — D1800 Hemangioma unspecified site: Secondary | ICD-10-CM

## 2020-07-29 DIAGNOSIS — D485 Neoplasm of uncertain behavior of skin: Secondary | ICD-10-CM

## 2020-07-29 DIAGNOSIS — D821 Di George's syndrome: Secondary | ICD-10-CM | POA: Diagnosis not present

## 2020-07-29 DIAGNOSIS — Z85828 Personal history of other malignant neoplasm of skin: Secondary | ICD-10-CM | POA: Diagnosis not present

## 2020-07-29 DIAGNOSIS — B079 Viral wart, unspecified: Secondary | ICD-10-CM | POA: Diagnosis not present

## 2020-07-29 DIAGNOSIS — L720 Epidermal cyst: Secondary | ICD-10-CM | POA: Diagnosis not present

## 2020-07-29 DIAGNOSIS — D692 Other nonthrombocytopenic purpura: Secondary | ICD-10-CM | POA: Diagnosis not present

## 2020-07-29 DIAGNOSIS — Z1283 Encounter for screening for malignant neoplasm of skin: Secondary | ICD-10-CM | POA: Diagnosis not present

## 2020-07-29 DIAGNOSIS — L820 Inflamed seborrheic keratosis: Secondary | ICD-10-CM

## 2020-07-29 DIAGNOSIS — D492 Neoplasm of unspecified behavior of bone, soft tissue, and skin: Secondary | ICD-10-CM

## 2020-07-29 DIAGNOSIS — L578 Other skin changes due to chronic exposure to nonionizing radiation: Secondary | ICD-10-CM

## 2020-07-29 DIAGNOSIS — L739 Follicular disorder, unspecified: Secondary | ICD-10-CM | POA: Diagnosis not present

## 2020-07-29 NOTE — Patient Instructions (Signed)

## 2020-07-29 NOTE — Progress Notes (Signed)
Follow-Up Visit   Subjective  Stephen Stein is a 79 y.o. male who presents for the following: check spot (R face, ~109yr, no symptoms), hx BCC (R chest parasternal), and Annual Exam (Upper body skin exam). The patient presents for Upper Body Skin Exam (UBSE) for skin cancer screening and mole check.  The following portions of the chart were reviewed this encounter and updated as appropriate:  Tobacco  Allergies  Meds  Problems  Med Hx  Surg Hx  Fam Hx     Review of Systems:  No other skin or systemic complaints except as noted in HPI or Assessment and Plan.  Objective  Well appearing patient in no apparent distress; mood and affect are within normal limits.  All skin waist up examined.  Objective  Right cheek: 0.6cm stuck on waxy pap  Objective  R chest parasternal: Well healed scar with no evidence of recurrence.   Objective  Upper back: Cystic pap  Objective  Right Lower Back x 1: Erythematous keratotic or waxy stuck-on papule or plaque.    Assessment & Plan    Actinic Damage - diffuse scaly erythematous macules with underlying dyspigmentation - Recommend daily broad spectrum sunscreen SPF 30+ to sun-exposed areas, reapply every 2 hours as needed.  - Call for new or changing lesions.  Hemangiomas - Red papules - Discussed benign nature - Observe - Call for any changes  Seborrheic Keratoses - Stuck-on, waxy, tan-brown papules and plaques  - Discussed benign etiology and prognosis. - Observe - Call for any changes  Skin cancer screening performed today.  Purpura - Violaceous macules and patches - Benign - Related to age, sun damage and/or use of blood thinners - Observe - Can use OTC arnica containing moisturizer such as Dermend Bruise Formula if desired - Call for worsening or other concerns  Neoplasm of skin Right cheek  Epidermal / dermal shaving  Lesion diameter (cm):  0.6 Informed consent: discussed and consent obtained   Timeout:  patient name, date of birth, surgical site, and procedure verified   Procedure prep:  Patient was prepped and draped in usual sterile fashion Prep type:  Isopropyl alcohol Anesthesia: the lesion was anesthetized in a standard fashion   Anesthetic:  1% lidocaine w/ epinephrine 1-100,000 buffered w/ 8.4% NaHCO3 Instrument used: flexible razor blade   Hemostasis achieved with: pressure, aluminum chloride and electrodesiccation   Outcome: patient tolerated procedure well   Post-procedure details: sterile dressing applied and wound care instructions given   Dressing type: bandage and petrolatum    Specimen 1 - Surgical pathology Differential Diagnosis: D48.5 ISK r/o CA Check Margins: No 0.6cm stuck on waxy pap  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  Clear, observe for changes   Epidermal cyst Upper back  Benign, Observe  Inflamed seborrheic keratosis Right Lower Back x 1  Destruction of lesion - Right Lower Back x 1 Complexity: simple   Destruction method: cryotherapy   Informed consent: discussed and consent obtained   Timeout:  patient name, date of birth, surgical site, and procedure verified Lesion destroyed using liquid nitrogen: Yes   Region frozen until ice ball extended beyond lesion: Yes   Outcome: patient tolerated procedure well with no complications   Post-procedure details: wound care instructions given    Return in about 1 year (around 07/29/2021) for TBSE, hx BCC.  I, Othelia Pulling, RMA, am acting as scribe for Sarina Ser, MD .  Documentation: I have reviewed the above documentation for accuracy  and completeness, and I agree with the above.  Sarina Ser, MD

## 2020-08-05 ENCOUNTER — Telehealth: Payer: Self-pay

## 2020-08-05 NOTE — Telephone Encounter (Signed)
-----   Message from Ralene Bathe, MD sent at 08/05/2020 11:38 AM EDT ----- Skin , right cheek VERRUCA VULGARIS, IRRITATED, AND SEBACEOUS GLAND HYPERPLASIA  Benign viral wart with enlarged oil gland No further treatment necessary

## 2020-08-05 NOTE — Telephone Encounter (Signed)
Left message for patient to call office for results/hd 

## 2020-08-11 ENCOUNTER — Telehealth: Payer: Self-pay

## 2020-08-11 NOTE — Telephone Encounter (Signed)
Patient informed of pathology results 

## 2020-08-11 NOTE — Telephone Encounter (Signed)
-----   Message from Ralene Bathe, MD sent at 08/05/2020 11:38 AM EDT ----- Skin , right cheek VERRUCA VULGARIS, IRRITATED, AND SEBACEOUS GLAND HYPERPLASIA  Benign viral wart with enlarged oil gland No further treatment necessary

## 2020-09-28 ENCOUNTER — Other Ambulatory Visit: Payer: Self-pay | Admitting: Family Medicine

## 2020-09-29 DIAGNOSIS — R4189 Other symptoms and signs involving cognitive functions and awareness: Secondary | ICD-10-CM | POA: Diagnosis not present

## 2020-09-29 DIAGNOSIS — I10 Essential (primary) hypertension: Secondary | ICD-10-CM | POA: Diagnosis not present

## 2020-09-29 DIAGNOSIS — R2689 Other abnormalities of gait and mobility: Secondary | ICD-10-CM | POA: Diagnosis not present

## 2020-10-20 ENCOUNTER — Other Ambulatory Visit: Payer: Self-pay

## 2020-10-20 ENCOUNTER — Encounter: Payer: Self-pay | Admitting: Family Medicine

## 2020-10-20 ENCOUNTER — Ambulatory Visit (INDEPENDENT_AMBULATORY_CARE_PROVIDER_SITE_OTHER): Payer: PPO | Admitting: Family Medicine

## 2020-10-20 VITALS — BP 120/72 | HR 80 | Temp 98.1°F | Resp 16 | Ht 70.0 in | Wt 209.0 lb

## 2020-10-20 DIAGNOSIS — I10 Essential (primary) hypertension: Secondary | ICD-10-CM

## 2020-10-20 DIAGNOSIS — Z5181 Encounter for therapeutic drug level monitoring: Secondary | ICD-10-CM

## 2020-10-20 DIAGNOSIS — E663 Overweight: Secondary | ICD-10-CM

## 2020-10-20 DIAGNOSIS — I472 Ventricular tachycardia: Secondary | ICD-10-CM | POA: Insufficient documentation

## 2020-10-20 DIAGNOSIS — E43 Unspecified severe protein-calorie malnutrition: Secondary | ICD-10-CM | POA: Diagnosis not present

## 2020-10-20 DIAGNOSIS — R399 Unspecified symptoms and signs involving the genitourinary system: Secondary | ICD-10-CM

## 2020-10-20 DIAGNOSIS — R7303 Prediabetes: Secondary | ICD-10-CM

## 2020-10-20 DIAGNOSIS — G20 Parkinson's disease: Secondary | ICD-10-CM

## 2020-10-20 DIAGNOSIS — G20A1 Parkinson's disease without dyskinesia, without mention of fluctuations: Secondary | ICD-10-CM | POA: Insufficient documentation

## 2020-10-20 DIAGNOSIS — I4729 Other ventricular tachycardia: Secondary | ICD-10-CM | POA: Insufficient documentation

## 2020-10-20 DIAGNOSIS — I25118 Atherosclerotic heart disease of native coronary artery with other forms of angina pectoris: Secondary | ICD-10-CM

## 2020-10-20 DIAGNOSIS — E538 Deficiency of other specified B group vitamins: Secondary | ICD-10-CM | POA: Diagnosis not present

## 2020-10-20 DIAGNOSIS — I6523 Occlusion and stenosis of bilateral carotid arteries: Secondary | ICD-10-CM

## 2020-10-20 DIAGNOSIS — I700 Atherosclerosis of aorta: Secondary | ICD-10-CM

## 2020-10-20 DIAGNOSIS — E782 Mixed hyperlipidemia: Secondary | ICD-10-CM | POA: Diagnosis not present

## 2020-10-20 MED ORDER — TAMSULOSIN HCL 0.4 MG PO CAPS: ORAL_CAPSULE | ORAL | 1 refills | Status: DC

## 2020-10-20 MED ORDER — ROSUVASTATIN CALCIUM 10 MG PO TABS: ORAL_TABLET | ORAL | 3 refills | Status: DC

## 2020-10-20 NOTE — Progress Notes (Signed)
Name: Stephen Stein   MRN: [id number]    DOB: 05-30-1941   Date:10/20/2020       Progress Note  Chief Complaint  Patient presents with  . Hyperlipidemia  . Follow-up     Subjective:   Stephen Stein is a 79 y.o. male, presents to clinic for routine f/up, has not been seen in about one year.  Was having difficulty last year with malnutrition and weight loss Weight now stable, increasing and BMI in overweight category Wt Readings from Last 5 Encounters:  10/20/20 209 lb (94.8 kg)  04/24/20 207 lb 2 oz (94 kg)  02/20/20 205 lb 3.2 oz (93.1 kg)  11/25/19 198 lb 4.8 oz (89.9 kg)  09/11/19 193 lb 12.6 oz (87.9 kg)   BMI Readings from Last 5 Encounters:  10/20/20 29.99 kg/m  04/24/20 29.72 kg/m  02/20/20 29.44 kg/m  11/25/19 32.01 kg/m  09/11/19 27.81 kg/m   Hyperlipidemia: Currently treated with crestor 10 mg, pt reports good med compliance Last Lipids: Lab Results  Component Value Date   CHOL 142 11/25/2019   HDL 43 11/25/2019   [place] 74 11/25/2019   TRIG 168 (H) 11/25/2019   CHOLHDL 3.3 11/25/2019   - Denies: Chest pain, shortness of breath, myalgias, claudication  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 No hemodynamically significant ICA stenosis. Degree of narrowing less than 50% bilaterally by ultrasound criteria.  Hx of CAD and PSVT - cannot find cardiology consults? ECHO 10/09/2018 by Dr. Saunders Revel ECHO LVEF 45-50% Per cardiology - last f/up OV with Ignacia Bayley 04/24/2020: . Hyperlipidemia   . Hypertension    For irregular heart beat  . Irregular heartbeat/Palpitations    a. Has been taking Atenolol for 20 years  . LBBB (left bundle branch block)   . Left ventricular hypokinesis    a. 09/2018 Echo: EF 45-50%, mod LVH, diff HK. Gr1 DD. Triv AI. Mildly dil Ao root. Mildly reduced RV fxn.  . Lightheadedness   . NSVT (nonsustained ventricular  tachycardia) ([place])    a. 11/2018 Zio monitor - 13 beats of NSVT--asymptomatic.  . Orthostatic hypotension    a. 11/2018 pressures improved w/ midodrine/florinef.  . Sinus bradycardia    a. 11/2018 Zio monitor: Avg HR 57 (min 28). Intermittent Mobitz I.   He does see EP now - Dr. Caryl Comes Complete cardiac hx: 79 year old male with above past medical history including hypertension, hyperlipidemia, palpitations, BPH, coronary calcifications, orthostatic hypotension with lightheadedness, confusion, and biliary dyskinesia/cholecystitis status post cholecystectomy.  He was previously evaluated w/ coronary CT with calcium scoring in December 2016, revealing a calcium score of 892, placing him in the 80th percentile.  This was followed by stress testing March 2017 which was low risk and nonischemic.  In July 2019, he was noted to have left bundle branch block and Mobitz 1 heart block.  He was asymptomatic and hemodynamically stable.  Beta-blocker dose was reduced.  Echocardiogram in October 2019 showed slight reduction LV function with an EF of 45-50% with diffuse hypokinesis, grade 1 diastolic dysfunction, mild reduced RV function.  In the setting of mild LV dysfunction, stress testing was undertaken Lexiscan Myoview was nonischemic.  In the setting of orthostatic symptoms and lightheadedness, he was placed on Florinef and subsequently midodrine.  Zi0 monitoring showed nocturnal bradycardia and 13 beats of nonsustained VT. midodrine was discontinued in the setting of restless legs.  As he reported constant lightheadedness regardless of positioning,  blood pressure, or heart rate, it became clear that symptoms were noncardiac.  He followed up with Dr. Caryl Comes January 2020 and Florinef was discontinued.  He was last seen via telemedicine visit in April 2020, at which time he reported improved lightheadedness on clonazepam and sertraline therapy.  Over the past year, he notes very stable symptoms and says that  overall, he is doing quite well.  He still experiences lightheadedness and needs to steady himself after initially standing but his activity tolerance is much improved.  He will fall up to about twice a month due to unsteadiness, if he gets up too quickly.  He has not sustained any severe injuries but has been dealing with left shoulder pain status post prior replacement.  He denies chest pain, dyspnea, palpitations, PND, orthopnea, syncope, edema, or early satiety.   Seeing Neurology for gait impairment, cognitive impairment Dx with parkinsons disease - tx with sinemet, dose recently increased Gait, balance, tremor and orthostatic hypotension are sx he's had for years, he denies any change to postural lightheadedness/dizziness  BPH with LUTS - asks for refill on flomax, has seen urology in the past, no SE with taking flomax at bedtime, denies hypotension, lightheadedness or syncope with taking flomax at bedtime.      Current Outpatient Medications:  .  acetaminophen (TYLENOL) 500 MG tablet, Take 1,000 mg by mouth every 6 (six) hours as needed for moderate pain., Disp: , Rfl:  .  Azelastine-Fluticasone 137-50 MCG/ACT SUSP, Place 1 spray into the nose in the morning and at bedtime., Disp: 23 g, Rfl: 3 .  carbidopa-levodopa (SINEMET CR) 50-200 MG tablet, Take by mouth., Disp: , Rfl:  .  glucosamine-chondroitin 500-400 MG tablet, Take 1 tablet by mouth daily., Disp: , Rfl:  .  meloxicam (MOBIC) 7.5 MG tablet, Take 0.5-1 tablets (3.75-7.5 mg total) by mouth daily as needed for pain., Disp: , Rfl:  .  Multiple Vitamin (MULTIVITAMIN WITH MINERALS) TABS tablet, Take 1 tablet by mouth daily., Disp: , Rfl:  .  rosuvastatin (CRESTOR) 10 MG tablet, TAKE 1 TABLET BY MOUTH EVERYDAY AT BEDTIME, Disp: 90 tablet, Rfl: 1 .  saw palmetto 160 MG capsule, Take 160 mg by mouth daily. , Disp: , Rfl:  .  sertraline (ZOLOFT) 50 MG tablet, Take 50 mg by mouth at bedtime. , Disp: , Rfl:  .  vitamin B-12  (CYANOCOBALAMIN) 1000 MCG tablet, Take 1,000 mcg by mouth daily. , Disp: , Rfl:  .  vitamin C (ASCORBIC ACID) 500 MG tablet, Take 500 mg by mouth daily., Disp: , Rfl:  .  aspirin 81 MG chewable tablet, Chew 81 mg by mouth daily. (Patient not taking: Reported on 10/20/2020), Disp: , Rfl:  .  donepezil (ARICEPT) 5 MG tablet, Take 5 mg by mouth at bedtime.  (Patient not taking: Reported on 10/20/2020), Disp: , Rfl:  .  tamsulosin (FLOMAX) 0.4 MG CAPS capsule, TAKE 1 CAPSULE BY MOUTH EVERYDAY AT BEDTIME (Patient not taking: Reported on 10/20/2020), Disp: 90 capsule, Rfl: 1  Patient Active Problem List   Diagnosis Date Noted  . NSVT (nonsustained ventricular tachycardia) ([place]) 10/20/2020  . Status post total shoulder replacement, left 09/11/2019  . Carotid atherosclerosis, bilateral 02/20/2019  . Vitamin B12 deficiency 10/15/2018  . Memory impairment 10/12/2018  . Hx of major depression 10/12/2018  . Protein-calorie malnutrition, severe 08/20/2018  . Left bundle branch block 06/25/2018  . Second degree AV block, Mobitz type I 06/25/2018  . Aortic calcification ([place]) 06/15/2018  . Adrenal adenoma 05/09/2017  .  Pulmonary nodules 04/19/2017  . BPH (benign prostatic hyperplasia) 04/19/2017  . 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 Coronary artery disease of native artery of native heart with stable angina pectoris ([place]) 03/25/2016  . Hyperlipidemia 07/07/2015  . Essential hypertension 07/07/2015    Past Surgical History:  Procedure Laterality Date  . CHOLECYSTECTOMY N/A 09/13/2018   Procedure: LAPAROSCOPIC CHOLECYSTECTOMY;  Surgeon: Jules Husbands, MD;  Location: [facility] ORS;  Service: General;  Laterality: N/A;  . COLONOSCOPY  2008  . ESOPHAGOGASTRODUODENOSCOPY (EGD) WITH PROPOFOL N/A 08/20/2018   Procedure: ESOPHAGOGASTRODUODENOSCOPY (EGD) WITH PROPOFOL;  Surgeon: Lucilla Lame, MD;  Location: [facility] ENDOSCOPY;  Service: Endoscopy;  Laterality: N/A;  . TONSILLECTOMY AND ADENOIDECTOMY   1955  . TOTAL SHOULDER ARTHROPLASTY Left 09/11/2019   Procedure: TOTAL SHOULDER ARTHROPLASTY;  Surgeon: Lovell Sheehan, MD;  Location: [facility] ORS;  Service: Orthopedics;  Laterality: Left;    Family History  Problem Relation Age of Onset  . Cancer Mother        lung  . Heart disease Father   . Hearing loss Sister        coclear implant  . Hearing loss Brother   . Cancer Paternal Grandmother   . Cancer Son        colon cancer  . Colon cancer Neg Hx   . Stomach cancer Neg Hx     Social History   Tobacco Use  . Smoking status: Former Smoker    Packs/day: 1.50    Years: 35.00    Pack years: 52.50    Types: Cigarettes    Quit date: 06/22/1992    Years since quitting: 28.3  . Smokeless tobacco: Former Systems developer    Quit date: 06/22/1992  Vaping Use  . Vaping Use: Never used  Substance Use Topics  . Alcohol use: Yes    Alcohol/week: 1.0 standard drink    Types: 1 Cans of beer per week    Comment: occasional  . Drug use: No     No Known Allergies  Health Maintenance  Topic Date Due  . Hepatitis C Screening  Never done  . URINE MICROALBUMIN  Never done  . COVID-19 Vaccine (2 - Moderna 2-dose series) 02/04/2020  . INFLUENZA VACCINE  07/26/2020  . TETANUS/TDAP  06/20/2021  . PNA vac Low Risk Adult  Completed    Chart Review Today: I personally reviewed active problem list, medication list, allergies, family history, social history, health maintenance, notes from last encounter, lab results, imaging with the patient/caregiver today.   Review of Systems  10 Systems reviewed and are negative for acute change except as noted in the HPI.  Objective:   Vitals:   10/20/20 1029  BP: 120/72  Pulse: 80  Resp: 16  Temp: 98.1 F (36.7 C)  TempSrc: Oral  SpO2: 99%  Weight: 209 lb (94.8 kg)  Height: 5\' 10"  (1.778 m)    Body mass index is 29.99 kg/m.  Physical Exam Vitals and nursing note reviewed.  Constitutional:      General: He is not in acute distress.     Appearance: Normal appearance. He is well-developed. He is not ill-appearing, toxic-appearing or diaphoretic.     Interventions: Face mask in place.  HENT:     Head: Normocephalic and atraumatic.     Jaw: No trismus.     Right Ear: External ear normal.     Left Ear: External ear normal.  Eyes:     General: Lids are normal. No scleral icterus.  Right eye: No discharge.        Left eye: No discharge.     Conjunctiva/sclera: Conjunctivae normal.     Pupils: Pupils are equal, round, and reactive to light.  Neck:     Trachea: Trachea and phonation normal. No tracheal deviation.  Cardiovascular:     Rate and Rhythm: Normal rate and regular rhythm.     Pulses: Normal pulses.          Radial pulses are 2+ on the right side and 2+ on the left side.       Posterior tibial pulses are 2+ on the right side and 2+ on the left side.     Heart sounds: Normal heart sounds. No murmur heard.  No friction rub. No gallop.   Pulmonary:     Effort: Pulmonary effort is normal. No respiratory distress.     Breath sounds: Normal breath sounds. No stridor. No wheezing, rhonchi or rales.  Abdominal:     General: Bowel sounds are normal. There is no distension.     Palpations: Abdomen is soft.     Tenderness: There is no abdominal tenderness. There is no guarding or rebound.  Musculoskeletal:        General: Normal range of motion.     Cervical back: Normal range of motion and neck supple.     Right lower leg: No edema.     Left lower leg: No edema.  Skin:    General: Skin is warm and dry.     Coloration: Skin is not jaundiced.     Findings: No rash.     Nails: There is no clubbing.  Neurological:     Mental Status: He is alert. Mental status is at baseline.     Cranial Nerves: No dysarthria or facial asymmetry.     Motor: No tremor or abnormal muscle tone.     Gait: Gait normal.     Comments: Baseline per wife who answers some questions for pt  Psychiatric:        Mood and Affect: Mood normal.         Behavior: Behavior is cooperative.        Cognition and Memory: Memory is impaired.     Comments: Pleasant mood         Assessment & Plan:   1. Mixed hyperlipidemia Compliant with statins, no SE or concerns, due for labs - COMPLETE METABOLIC PANEL WITH GFR - Lipid panel - rosuvastatin (CRESTOR) 10 MG tablet; TAKE 1 TABLET BY MOUTH EVERYDAY AT BEDTIME  Dispense: 90 tablet; Refill: 3  2. Vitamin B12 deficiency Screen cbc, pt has continued supplement - CBC with Differential/Platelet  3. Aortic calcification (HCC) On statin, monitoring - COMPLETE METABOLIC PANEL WITH GFR - Lipid panel  4. Essential hypertension Stable, well controlled, BP at goal today - COMPLETE METABOLIC PANEL WITH GFR  5. Medication monitoring encounter - CBC with Differential/Platelet - COMPLETE METABOLIC PANEL WITH GFR - Lipid panel - Hemoglobin A1c  6. NSVT (nonsustained ventricular tachycardia) ([place]) Per cardiology, not currently symptomatic  7. Protein-calorie malnutrition, severe (HCC) Improving weight, stabilizing  8. Coronary artery disease of native artery of native heart with stable angina pectoris [facility]) Per cardiology, on statin and asa - COMPLETE METABOLIC PANEL WITH GFR - Lipid panel  9. Carotid atherosclerosis, bilateral On statin, stable, monitoring - COMPLETE METABOLIC PANEL WITH GFR - Lipid panel  10. Prediabetes Recheck, monitoring - COMPLETE METABOLIC PANEL WITH GFR - Lipid panel - Hemoglobin A1c  11. Overweight (BMI 25.0-29.9) Discussed continued healthy diet and exercise as able  12. Lower urinary tract symptoms (LUTS) Refilled meds for urinary sx - tamsulosin (FLOMAX) 0.4 MG CAPS capsule; TAKE 1 CAPSULE BY MOUTH EVERYDAY AT BEDTIME  Dispense: 90 capsule; Refill: 1  13. Parkinson's disease ([place]) Per specialist - pt on sinemet - carbidopa-levodopa (SINEMET CR) 50-200 MG tablet; Take by mouth.   Return in about 6 months (around 04/20/2021) for Routine  follow-up.   Delsa Grana, PA-C 10/20/20 10:44 AM

## 2020-10-21 LAB — LIPID PANEL
Cholesterol: 131 mg/dL (ref <200)
HDL: 37 mg/dL — ABNORMAL LOW (ref >=40)
LDL Cholesterol (Calc): 64 mg/dL (calc)
Non-HDL Cholesterol (Calc): 94 mg/dL (calc) (ref <130)
Total CHOL/HDL Ratio: 3.5 (calc) (ref <5.0)
Triglycerides: 241 mg/dL — ABNORMAL HIGH (ref <150)

## 2020-10-21 LAB — CBC WITH DIFFERENTIAL/PLATELET
Absolute Monocytes: 950 cells/uL (ref 200–950)
Basophils Absolute: 53 cells/uL (ref 0–200)
Basophils Relative: 0.7 %
Eosinophils Absolute: 631 cells/uL — ABNORMAL HIGH (ref 15–500)
Eosinophils Relative: 8.3 %
HCT: 40.9 % (ref 38.5–50.0)
Hemoglobin: 13.8 g/dL (ref 13.2–17.1)
Lymphs Abs: 1376 cells/uL (ref 850–3900)
MCH: 32.1 pg (ref 27.0–33.0)
MCHC: 33.7 g/dL (ref 32.0–36.0)
MCV: 95.1 fL (ref 80.0–100.0)
MPV: 9.9 fL (ref 7.5–12.5)
Monocytes Relative: 12.5 %
Neutro Abs: 4590 cells/uL (ref 1500–7800)
Neutrophils Relative %: 60.4 %
Platelets: 216 10*3/uL (ref 140–400)
RBC: 4.3 10*6/uL (ref 4.20–5.80)
RDW: 12.6 % (ref 11.0–15.0)
Total Lymphocyte: 18.1 %
WBC: 7.6 10*3/uL (ref 3.8–10.8)

## 2020-10-21 LAB — COMPLETE METABOLIC PANEL WITH GFR
AG Ratio: 1.5 (calc) (ref 1.0–2.5)
ALT: 8 U/L — ABNORMAL LOW (ref 9–46)
AST: 19 U/L (ref 10–35)
Albumin: 4.1 g/dL (ref 3.6–5.1)
Alkaline phosphatase (APISO): 29 U/L — ABNORMAL LOW (ref 35–144)
BUN: 20 mg/dL (ref 7–25)
CO2: 30 mmol/L (ref 20–32)
Calcium: 9.9 mg/dL (ref 8.6–10.3)
Chloride: 103 mmol/L (ref 98–110)
Creat: 1.08 mg/dL (ref 0.70–1.18)
GFR, Est African American: 76 mL/min/{1.73_m2} (ref >=60)
GFR, Est Non African American: 65 mL/min/{1.73_m2} (ref >=60)
Globulin: 2.7 g/dL (calc) (ref 1.9–3.7)
Glucose, Bld: 106 mg/dL (ref 65–139)
Potassium: 5.2 mmol/L (ref 3.5–5.3)
Sodium: 139 mmol/L (ref 135–146)
Total Bilirubin: 0.4 mg/dL (ref 0.2–1.2)
Total Protein: 6.8 g/dL (ref 6.1–8.1)

## 2020-10-21 LAB — HEMOGLOBIN A1C
Hgb A1c MFr Bld: 5.7 % of total Hgb — ABNORMAL HIGH (ref <5.7)
Mean Plasma Glucose: 117 (calc)
eAG (mmol/L): 6.5 (calc)

## 2020-10-23 DIAGNOSIS — H2511 Age-related nuclear cataract, right eye: Secondary | ICD-10-CM | POA: Diagnosis not present

## 2020-10-26 DIAGNOSIS — S43102A Unspecified dislocation of left acromioclavicular joint, initial encounter: Secondary | ICD-10-CM | POA: Diagnosis not present

## 2020-10-26 DIAGNOSIS — M19011 Primary osteoarthritis, right shoulder: Secondary | ICD-10-CM | POA: Diagnosis not present

## 2020-10-26 DIAGNOSIS — Z96612 Presence of left artificial shoulder joint: Secondary | ICD-10-CM | POA: Diagnosis not present

## 2020-10-28 ENCOUNTER — Telehealth: Payer: Self-pay | Admitting: Family Medicine

## 2020-10-28 NOTE — Telephone Encounter (Signed)
Copied from [place] [phone]. Topic: Medicare AWV >> Oct 28, 2020  1:05 PM Cher Nakai R wrote: Reason for CRM:  No answer unable to leave message for patient to call back and schedule the Medicare Annual Wellness Visit (AWV) in office or virtual  Last AWV 07/04/2019  Please schedule at anytime with [place].  40 minute appointment  Any questions, please contact me at [phone]

## 2020-11-09 DIAGNOSIS — M25612 Stiffness of left shoulder, not elsewhere classified: Secondary | ICD-10-CM | POA: Diagnosis not present

## 2020-11-09 DIAGNOSIS — M25512 Pain in left shoulder: Secondary | ICD-10-CM | POA: Diagnosis not present

## 2020-11-24 DIAGNOSIS — M25612 Stiffness of left shoulder, not elsewhere classified: Secondary | ICD-10-CM | POA: Diagnosis not present

## 2020-11-24 DIAGNOSIS — M25512 Pain in left shoulder: Secondary | ICD-10-CM | POA: Diagnosis not present

## 2020-11-25 DIAGNOSIS — H2513 Age-related nuclear cataract, bilateral: Secondary | ICD-10-CM | POA: Diagnosis not present

## 2020-11-25 DIAGNOSIS — Z9841 Cataract extraction status, right eye: Secondary | ICD-10-CM

## 2020-11-25 DIAGNOSIS — E7800 Pure hypercholesterolemia, unspecified: Secondary | ICD-10-CM | POA: Diagnosis not present

## 2020-11-25 HISTORY — DX: Cataract extraction status, left eye: Z98.41

## 2020-11-30 DIAGNOSIS — M25612 Stiffness of left shoulder, not elsewhere classified: Secondary | ICD-10-CM | POA: Diagnosis not present

## 2020-11-30 DIAGNOSIS — M25512 Pain in left shoulder: Secondary | ICD-10-CM | POA: Diagnosis not present

## 2020-12-01 ENCOUNTER — Encounter: Payer: Self-pay | Admitting: Ophthalmology

## 2020-12-01 DIAGNOSIS — M19011 Primary osteoarthritis, right shoulder: Secondary | ICD-10-CM | POA: Diagnosis not present

## 2020-12-01 DIAGNOSIS — Z96612 Presence of left artificial shoulder joint: Secondary | ICD-10-CM | POA: Diagnosis not present

## 2020-12-03 ENCOUNTER — Ambulatory Visit (INDEPENDENT_AMBULATORY_CARE_PROVIDER_SITE_OTHER): Payer: PPO

## 2020-12-03 ENCOUNTER — Other Ambulatory Visit: Payer: Self-pay

## 2020-12-03 VITALS — BP 128/78 | HR 74 | Temp 97.5°F | Resp 16 | Ht 70.0 in | Wt 217.1 lb

## 2020-12-03 DIAGNOSIS — Z0000 Encounter for general adult medical examination without abnormal findings: Secondary | ICD-10-CM

## 2020-12-03 NOTE — Progress Notes (Signed)
Subjective:   Stephen Stein is a 79 y.o. male who presents for Medicare Annual/Subsequent preventive examination.  Review of Systems     Cardiac Risk Factors include: advanced age (>64men, >79 women);dyslipidemia;obesity (BMI >30kg/m2);male gender     Objective:    Today's Vitals   12/03/20 1538 12/03/20 1541  BP: 128/78   Pulse: 74   Resp: 16   Temp: (!) 97.5 F (36.4 C)   TempSrc: Oral   SpO2: 99%   Weight: 217 lb 1.6 oz (98.5 kg)   Height: 5\' 10"  (1.778 m)   PainSc:  3    Body mass index is 31.15 kg/m.  Advanced Directives 12/03/2020 09/11/2019 09/06/2019 07/04/2019 05/15/2019 02/14/2019 01/29/2019  Does Patient Have a Medical Advance Directive? Yes Yes Yes Yes Yes Yes Yes  Type of Paramedic of [place];Living will [place];Living will [place];Living will [place];Living will - - -  Does patient want to make changes to medical advance directive? - No - Patient declined No - Patient declined - - - -  Copy of [place] in Chart? Yes - validated most recent copy scanned in chart (See row information) No - copy requested No - copy requested Yes - validated most recent copy scanned in chart (See row information) - - -  Would patient like information on creating a medical advance directive? - - - - - - -    Current Medications (verified) Outpatient Encounter Medications as of 12/03/2020  Medication Sig  . acetaminophen (TYLENOL) 500 MG tablet Take 1,000 mg by mouth every 6 (six) hours as needed for moderate pain.  [name] azelastine (ASTELIN) 0.1 % nasal spray Place 2 sprays into both nostrils 2 (two) times daily.  . carbidopa-levodopa (SINEMET CR) 50-200 MG tablet Take by mouth.  . carbidopa-levodopa (SINEMET IR) 25-100 MG tablet Take 1.5 tablets by mouth 3 (three) times daily.  [name] donepezil (ARICEPT) 10 MG tablet Take 10 mg by mouth at bedtime.  [name] glucosamine-chondroitin 500-400 MG  tablet Take 1 tablet by mouth daily.  . meloxicam (MOBIC) 7.5 MG tablet Take 0.5-1 tablets (3.75-7.5 mg total) by mouth daily as needed for pain.  . Multiple Vitamin (MULTIVITAMIN WITH MINERALS) TABS tablet Take 1 tablet by mouth daily.  . rosuvastatin (CRESTOR) 10 MG tablet TAKE 1 TABLET BY MOUTH EVERYDAY AT BEDTIME  . saw palmetto 160 MG capsule Take 160 mg by mouth daily.   . sertraline (ZOLOFT) 100 MG tablet Take 100 mg by mouth daily.  . tamsulosin (FLOMAX) 0.4 MG CAPS capsule TAKE 1 CAPSULE BY MOUTH EVERYDAY AT BEDTIME  . vitamin B-12 (CYANOCOBALAMIN) 1000 MCG tablet Take 1,000 mcg by mouth daily.   . vitamin C (ASCORBIC ACID) 500 MG tablet Take 500 mg by mouth daily.  . [DISCONTINUED] Azelastine-Fluticasone 137-50 MCG/ACT SUSP Place 1 spray into the nose in the morning and at bedtime.  . [DISCONTINUED] donepezil (ARICEPT) 5 MG tablet Take 5 mg by mouth at bedtime.  (Patient not taking: Reported on 10/20/2020)  . [DISCONTINUED] sertraline (ZOLOFT) 50 MG tablet Take 50 mg by mouth at bedtime.    No facility-administered encounter medications on file as of 12/03/2020.    Allergies (verified) Patient has no known allergies.   History: Past Medical History:  Diagnosis Date  . 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 Biliary dyskinesia    a. 08/2018 s/p lap chole.  [name]  BPH (benign prostatic hyperplasia)   . Carotid atherosclerosis, bilateral    a. 01/2019 Carotid U/S: <50% bilat ICA stenosis.  . Coronary artery calcification seen on CT scan    a. 11/2015 Cardiac CT: Cor Ca2+ = 892 (80th %'ile). Diffuse Ca2+ in all 3 major coronary arteries; b. 02/2016 MV: HTN response to exercise. Abnl ecg w/ horizontal inflat ST depression, but no ischemia/infarct on imaging; c. 09/2018 MV: EF 64%, no ischemia. Very sm region of mild distal antsept defect, likely 2/2 lbbb.  . Depression   . Hx of basal cell carcinoma 08/01/2019   R chest parasternal  . Hyperlipidemia   .  Hypertension    For irregular heart beat  . Irregular heartbeat/Palpitations    a. Has been taking Atenolol for 20 years  . LBBB (left bundle branch block)   . Left ventricular hypokinesis    a. 09/2018 Echo: EF 45-50%, mod LVH, diff HK. Gr1 DD. Triv AI. Mildly dil Ao root. Mildly reduced RV fxn.  . Lightheadedness   . NSVT (nonsustained ventricular tachycardia) ([place])    a. 11/2018 Zio monitor - 13 beats of NSVT--asymptomatic.  . Orthostatic hypotension    a. 11/2018 pressures improved w/ midodrine/florinef.  . Parkinson's disease ([place])   . Sinus bradycardia    a. 11/2018 Zio monitor: Avg HR 57 (min 28). Intermittent Mobitz I.   Past Surgical History:  Procedure Laterality Date  . CHOLECYSTECTOMY N/A 09/13/2018   Procedure: LAPAROSCOPIC CHOLECYSTECTOMY;  Surgeon: Jules Husbands, MD;  Location: [facility] ORS;  Service: General;  Laterality: N/A;  . COLONOSCOPY  2008  . ESOPHAGOGASTRODUODENOSCOPY (EGD) WITH PROPOFOL N/A 08/20/2018   Procedure: ESOPHAGOGASTRODUODENOSCOPY (EGD) WITH PROPOFOL;  Surgeon: Lucilla Lame, MD;  Location: [facility] ENDOSCOPY;  Service: Endoscopy;  Laterality: N/A;  . TONSILLECTOMY AND ADENOIDECTOMY  1955  . 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 Cancer Mother        lung  . Heart disease Father   . Hearing loss Sister        coclear implant  . Hearing loss Brother   . Cancer Paternal Grandmother   . Cancer Son        colon cancer  . Colon cancer Neg Hx   . Stomach cancer Neg Hx    Social History   Socioeconomic History  . Marital status: Married    Spouse name: Not on file  . Number of children: 2  . Years of education: Not on file  . Highest education level: Bachelor's degree (e.g., BA, AB, BS)  Occupational History  . Not on file  Tobacco Use  . Smoking status:  Former Smoker    Packs/day: 1.50    Years: 35.00    Pack years: 52.50    Types: Cigarettes    Quit date: 06/22/1992    Years since quitting: 28.4  . Smokeless tobacco: Former Systems developer    Quit date: 06/22/1992  Vaping Use  . Vaping Use: Never used  Substance and Sexual Activity  . Alcohol use: Yes    Alcohol/week: 1.0 standard drink    Types: 1 Cans of beer per week    Comment: occasional  . Drug use: No  . Sexual activity: Not Currently  Other Topics Concern  . Not on file  Social History Narrative  . Not on file  Social Determinants of Health   Financial Resource Strain: Low Risk   . Difficulty of Paying Living Expenses: Not hard at all  Food Insecurity: No Food Insecurity  . Worried About Charity fundraiser in the Last Year: Never true  . Ran Out of Food in the Last Year: Never true  Transportation Needs: No Transportation Needs  . Lack of Transportation (Medical): No  . Lack of Transportation (Non-Medical): No  Physical Activity: Sufficiently Active  . Days of Exercise per Week: 5 days  . Minutes of Exercise per Session: 30 min  Stress: No Stress Concern Present  . Feeling of Stress : Not at all  Social Connections: Moderately Integrated  . Frequency of Communication with Friends and Family: More than three times a week  . Frequency of Social Gatherings with Friends and Family: Three times a week  . Attends Religious Services: More than 4 times per year  . Active Member of Clubs or Organizations: No  . Attends Archivist Meetings: Never  . Marital Status: Married    Tobacco Counseling Counseling given: Not Answered   Clinical Intake:  Pre-visit preparation completed: Yes  Pain : 0-10 Pain Score: 3  Pain Type: Chronic pain Pain Location: Shoulder Pain Orientation: Right,Left Pain Descriptors / Indicators: Aching,Sore Pain Onset: More than a month ago Pain Frequency: Intermittent     BMI - recorded: 31.15 Nutritional Status: BMI > 30   Obese Nutritional Risks: None Diabetes: No  How often do you need to have someone help you when you read instructions, pamphlets, or other written materials from your doctor or pharmacy?: 1 - Never    Interpreter Needed?: No  Information entered by :: Clemetine Marker LPN   Activities of Daily Living In your present state of health, do you have any difficulty performing the following activities: 12/03/2020 10/20/2020  Hearing? Tempie Donning  Comment wears hearing aids -  Vision? Y Y  Difficulty concentrating or making decisions? Tempie Donning  Walking or climbing stairs? Y Y  Dressing or bathing? N N  Doing errands, shopping? N Y  Conservation officer, nature and eating ? N -  Using the Toilet? N -  In the past six months, have you accidently leaked urine? N -  Do you have problems with loss of bowel control? N -  Managing your Medications? N -  Managing your Finances? N -  Housekeeping or managing your Housekeeping? N -  Some recent data might be hidden    Patient Care Team: Delsa Grana, PA-C as PCP - General (Family Medicine) Minna Merritts, MD as PCP - Cardiology (Cardiology) Minna Merritts, MD as Consulting Physician (Cardiology) Vladimir Crofts, MD as Consulting Physician (Neurology)  Indicate any recent Medical Services you may have received from other than [facility] providers in the past year (date may be approximate).     Assessment:   This is a routine wellness examination for Yassine.  Hearing/Vision screen  Hearing Screening   125Hz  250Hz  500Hz  1000Hz  2000Hz  3000Hz  4000Hz  6000Hz  8000Hz   Right ear:           Left ear:           Comments: Pt wears hearing aids maintained by [employer]   Vision Screening Comments: [facility]  Dietary issues and exercise activities discussed: Current Exercise Habits: Home exercise routine, Type of exercise: walking, Time (Minutes): 30, Frequency (Times/Week): 5, Weekly Exercise (Minutes/Week): 150, Intensity: Mild, Exercise  limited by: neurologic condition(s);orthopedic condition(s)  Goals    . Increase physical activity     Pt would like to increase exercise to doing something daily       Depression Screen PHQ 2/9 Scores 12/03/2020 10/20/2020 11/25/2019 08/26/2019 07/04/2019 06/25/2019 02/04/2019  PHQ - 2 Score 0 2 0 0 0 0 0  PHQ- 9 Score - - 0 0 - 0 0    Fall Risk Fall Risk  12/03/2020 10/20/2020 11/25/2019 08/26/2019 07/04/2019  Falls in the past year? 1 1 1 1 1   Number falls in past yr: 1 1 1 1 1   Injury with Fall? 0 0 0 0 0  Comment - - - - -  Risk for fall due to : History of fall(s);Impaired balance/gait History of fall(s) - - History of fall(s)  Follow up Falls prevention discussed Falls evaluation completed - - Falls prevention discussed    FALL RISK PREVENTION PERTAINING TO THE HOME:  Any stairs in or around the home? Yes  If so, are there any without handrails? No  Home free of loose throw rugs in walkways, pet beds, electrical cords, etc? Yes  Adequate lighting in your home to reduce risk of falls? Yes   ASSISTIVE DEVICES UTILIZED TO PREVENT FALLS:  Life alert? No  Use of a cane, walker or w/c? Yes - occasional use of cane  Grab bars in the bathroom? Yes  Shower chair or bench in shower? No  Elevated toilet seat or a handicapped toilet? Yes   TIMED UP AND GO:  Was the test performed? Yes .  Length of time to ambulate 10 feet: 5 sec.   Gait steady and fast without use of assistive device  Cognitive Function: Patient has current diagnosis of cognitive impairment. Patient is followed by neurology for ongoing assessment.        6CIT Screen 07/04/2019 10/12/2018  What Year? 0 points 4 points  What month? 0 points 0 points  What time? 0 points 0 points  Count back from 20 0 points 0 points  Months in reverse 2 points 4 points  Repeat phrase 2 points 4 points  Total Score 4 12    Immunizations Immunization History  Administered Date(s) Administered  . Influenza, High Dose Seasonal  PF 09/16/2017, 09/24/2018, 08/29/2019, 09/29/2020  . Influenza-Unspecified 08/26/2014, 09/22/2015  . Moderna SARS-COVID-2 Vaccination 01/07/2020  . Pneumococcal Conjugate-13 11/24/2014, 09/22/2015  . Pneumococcal Polysaccharide-23 09/24/2018  . Tdap 06/21/2011    TDAP status: Up to date  Flu Vaccine status: Up to date  Pneumococcal vaccine status: Up to date  Covid-19 vaccine status: Completed vaccines. Pt advised to bring vaccine record to next appt   Qualifies for Shingles Vaccine? Yes   Zostavax completed No   Shingrix Completed?: No.    Education has been provided regarding the importance of this vaccine. Patient has been advised to call insurance company to determine out of pocket expense if they have not yet received this vaccine. Advised may also receive vaccine at local pharmacy or Health Dept. Verbalized acceptance and understanding.  Screening Tests Health Maintenance  Topic Date Due  . Hepatitis C Screening  Never done  . URINE MICROALBUMIN  Never done  . COVID-19 Vaccine (2 - Moderna risk 4-dose series) 02/04/2020  . TETANUS/TDAP  06/20/2021  . INFLUENZA VACCINE  Completed  . PNA vac Low Risk Adult  Completed    Health Maintenance  Health Maintenance Due  Topic Date Due  . Hepatitis C Screening  Never done  . URINE MICROALBUMIN  Never done  .  COVID-19 Vaccine (2 - Moderna risk 4-dose series) 02/04/2020    Colorectal cancer screening: No longer required.   Lung Cancer Screening: (Low Dose CT Chest recommended if Age 36-80 years, 30 pack-year currently smoking OR have quit w/in 15years.) does not qualify.   Additional Screening:  Hepatitis C Screening: does qualify; postponed  Vision Screening: Recommended annual ophthalmology exams for early detection of glaucoma and other disorders of the eye. Is the patient up to date with their annual eye exam?  Yes  Who is the provider or what is the name of the office in which the patient attends annual eye exams?  [place] Screening: Recommended annual dental exams for proper oral hygiene  Community Resource Referral / Chronic Care Management: CRR required this visit?  No   CCM required this visit?  No      Plan:     I have personally reviewed and noted the following in the patient's chart:   . Medical and social history . Use of alcohol, tobacco or illicit drugs  . Current medications and supplements . Functional ability and status . Nutritional status . Physical activity . Advanced directives . List of other physicians . Hospitalizations, surgeries, and ER visits in previous 12 months . Vitals . Screenings to include cognitive, depression, and falls . Referrals and appointments  In addition, I have reviewed and discussed with patient certain preventive protocols, quality metrics, and best practice recommendations. A written personalized care plan for preventive services as well as general preventive health recommendations were provided to patient.     Clemetine Marker, LPN   [id number]   Nurse Notes: pt doing well; has upcoming cataract surgery this month.

## 2020-12-03 NOTE — Patient Instructions (Signed)
Stephen Stein , Thank you for taking time to come for your Medicare Wellness Visit. I appreciate your ongoing commitment to your health goals. Please review the following plan we discussed and let me know if I can assist you in the future.   Screening recommendations/referrals: Colonoscopy: no longer required Recommended yearly ophthalmology/optometry visit for glaucoma screening and checkup Recommended yearly dental visit for hygiene and checkup  Vaccinations: Influenza vaccine: done 09/29/20 Pneumococcal vaccine: done 09/24/18 Tdap vaccine: done 06/21/11 Shingles vaccine: Shingrix discussed. Please contact your pharmacy for coverage information.  Covid-19: please bring a copy of your vaccine record to your next appointment   Conditions/risks identified: Recommend fall prevention in the home by continuing balance therapy exercises.  Next appointment: Follow up in one year for your annual wellness visit.   Preventive Care 78 Years and Older, Male Preventive care refers to lifestyle choices and visits with your health care provider that can promote health and wellness. What does preventive care include?  A yearly physical exam. This is also called an annual well check.  Dental exams once or twice a year.  Routine eye exams. Ask your health care provider how often you should have your eyes checked.  Personal lifestyle choices, including:  Daily care of your teeth and gums.  Regular physical activity.  Eating a healthy diet.  Avoiding tobacco and drug use.  Limiting alcohol use.  Practicing safe sex.  Taking low doses of aspirin every day.  Taking vitamin and mineral supplements as recommended by your health care provider. What happens during an annual well check? The services and screenings done by your health care provider during your annual well check will depend on your age, overall health, lifestyle risk factors, and family history of disease. Counseling  Your health care  provider may ask you questions about your:  Alcohol use.  Tobacco use.  Drug use.  Emotional well-being.  Home and relationship well-being.  Sexual activity.  Eating habits.  History of falls.  Memory and ability to understand (cognition).  Work and work Statistician. Screening  You may have the following tests or measurements:  Height, weight, and BMI.  Blood pressure.  Lipid and cholesterol levels. These may be checked every 5 years, or more frequently if you are over 64 years old.  Skin check.  Lung cancer screening. You may have this screening every year starting at age 47 if you have a 30-pack-year history of smoking and currently smoke or have quit within the past 15 years.  Fecal occult blood test (FOBT) of the stool. You may have this test every year starting at age 26.  Flexible sigmoidoscopy or colonoscopy. You may have a sigmoidoscopy every 5 years or a colonoscopy every 10 years starting at age 54.  Prostate cancer screening. Recommendations will vary depending on your family history and other risks.  Hepatitis C blood test.  Hepatitis B blood test.  Sexually transmitted disease (STD) testing.  Diabetes screening. This is done by checking your blood sugar (glucose) after you have not eaten for a while (fasting). You may have this done every 1-3 years.  Abdominal aortic aneurysm (AAA) screening. You may need this if you are a current or former smoker.  Osteoporosis. You may be screened starting at age 57 if you are at high risk. Talk with your health care provider about your test results, treatment options, and if necessary, the need for more tests. Vaccines  Your health care provider may recommend certain vaccines, such as:  Influenza  vaccine. This is recommended every year.  Tetanus, diphtheria, and acellular pertussis (Tdap, Td) vaccine. You may need a Td booster every 10 years.  Zoster vaccine. You may need this after age 46.  Pneumococcal  13-valent conjugate (PCV13) vaccine. One dose is recommended after age 22.  Pneumococcal polysaccharide (PPSV23) vaccine. One dose is recommended after age 2. Talk to your health care provider about which screenings and vaccines you need and how often you need them. This information is not intended to replace advice given to you by your health care provider. Make sure you discuss any questions you have with your health care provider. Document Released: 01/08/2016 Document Revised: 08/31/2016 Document Reviewed: 10/13/2015 Elsevier Interactive Patient Education  2017 [place] Prevention in the Home Falls can cause injuries. They can happen to people of all ages. There are many things you can do to make your home safe and to help prevent falls. What can I do on the outside of my home?  Regularly fix the edges of walkways and driveways and fix any cracks.  Remove anything that might make you trip as you walk through a door, such as a raised step or threshold.  Trim any bushes or trees on the path to your home.  Use bright outdoor lighting.  Clear any walking paths of anything that might make someone trip, such as rocks or tools.  Regularly check to see if handrails are loose or broken. Make sure that both sides of any steps have handrails.  Any raised decks and porches should have guardrails on the edges.  Have any leaves, snow, or ice cleared regularly.  Use sand or salt on walking paths during winter.  Clean up any spills in your garage right away. This includes oil or grease spills. What can I do in the bathroom?  Use night lights.  Install grab bars by the toilet and in the tub and shower. Do not use towel bars as grab bars.  Use non-skid mats or decals in the tub or shower.  If you need to sit down in the shower, use a plastic, non-slip stool.  Keep the floor dry. Clean up any water that spills on the floor as soon as it happens.  Remove soap buildup in the  tub or shower regularly.  Attach bath mats securely with double-sided non-slip rug tape.  Do not have throw rugs and other things on the floor that can make you trip. What can I do in the bedroom?  Use night lights.  Make sure that you have a light by your bed that is easy to reach.  Do not use any sheets or blankets that are too big for your bed. They should not hang down onto the floor.  Have a firm chair that has side arms. You can use this for support while you get dressed.  Do not have throw rugs and other things on the floor that can make you trip. What can I do in the kitchen?  Clean up any spills right away.  Avoid walking on wet floors.  Keep items that you use a lot in easy-to-reach places.  If you need to reach something above you, use a strong step stool that has a grab bar.  Keep electrical cords out of the way.  Do not use floor polish or wax that makes floors slippery. If you must use wax, use non-skid floor wax.  Do not have throw rugs and other things on the floor that can  make you trip. What can I do with my stairs?  Do not leave any items on the stairs.  Make sure that there are handrails on both sides of the stairs and use them. Fix handrails that are broken or loose. Make sure that handrails are as long as the stairways.  Check any carpeting to make sure that it is firmly attached to the stairs. Fix any carpet that is loose or worn.  Avoid having throw rugs at the top or bottom of the stairs. If you do have throw rugs, attach them to the floor with carpet tape.  Make sure that you have a light switch at the top of the stairs and the bottom of the stairs. If you do not have them, ask someone to add them for you. What else can I do to help prevent falls?  Wear shoes that:  Do not have high heels.  Have rubber bottoms.  Are comfortable and fit you well.  Are closed at the toe. Do not wear sandals.  If you use a stepladder:  Make sure that it  is fully opened. Do not climb a closed stepladder.  Make sure that both sides of the stepladder are locked into place.  Ask someone to hold it for you, if possible.  Clearly mark and make sure that you can see:  Any grab bars or handrails.  First and last steps.  Where the edge of each step is.  Use tools that help you move around (mobility aids) if they are needed. These include:  Canes.  Walkers.  Scooters.  Crutches.  Turn on the lights when you go into a dark area. Replace any light bulbs as soon as they burn out.  Set up your furniture so you have a clear path. Avoid moving your furniture around.  If any of your floors are uneven, fix them.  If there are any pets around you, be aware of where they are.  Review your medicines with your doctor. Some medicines can make you feel dizzy. This can increase your chance of falling. Ask your doctor what other things that you can do to help prevent falls. This information is not intended to replace advice given to you by your health care provider. Make sure you discuss any questions you have with your health care provider. Document Released: 10/08/2009 Document Revised: 05/19/2016 Document Reviewed: 01/16/2015 Elsevier Interactive Patient Education  2017 [employer].

## 2020-12-07 ENCOUNTER — Other Ambulatory Visit: Payer: Self-pay

## 2020-12-07 ENCOUNTER — Other Ambulatory Visit
Admission: RE | Admit: 2020-12-07 | Discharge: 2020-12-07 | Disposition: A | Discharge status: Home / Self Care | Payer: PPO | Source: Ambulatory Visit | Attending: Ophthalmology | Admitting: Ophthalmology

## 2020-12-07 DIAGNOSIS — Z01812 Encounter for preprocedural laboratory examination: Secondary | ICD-10-CM | POA: Diagnosis not present

## 2020-12-07 DIAGNOSIS — Z20822 Contact with and (suspected) exposure to covid-19: Secondary | ICD-10-CM | POA: Diagnosis not present

## 2020-12-07 LAB — SARS CORONAVIRUS 2 (TAT 6-24 HRS): SARS Coronavirus 2: NEGATIVE

## 2020-12-07 NOTE — Discharge Instructions (Signed)

## 2020-12-08 DIAGNOSIS — M25512 Pain in left shoulder: Secondary | ICD-10-CM | POA: Diagnosis not present

## 2020-12-08 DIAGNOSIS — M25612 Stiffness of left shoulder, not elsewhere classified: Secondary | ICD-10-CM | POA: Diagnosis not present

## 2020-12-09 ENCOUNTER — Ambulatory Visit: Payer: PPO | Admitting: Anesthesiology

## 2020-12-09 ENCOUNTER — Other Ambulatory Visit: Payer: Self-pay

## 2020-12-09 ENCOUNTER — Ambulatory Visit
Admission: RE | Admit: 2020-12-09 | Discharge: 2020-12-09 | Disposition: A | Discharge status: Home / Self Care | Payer: PPO | Attending: Ophthalmology | Admitting: Ophthalmology

## 2020-12-09 ENCOUNTER — Encounter: Payer: Self-pay | Admitting: Ophthalmology

## 2020-12-09 ENCOUNTER — Encounter
Admission: RE | Disposition: A | Discharge status: Home / Self Care | Payer: Self-pay | Source: Home / Self Care | Attending: Ophthalmology

## 2020-12-09 DIAGNOSIS — H2512 Age-related nuclear cataract, left eye: Secondary | ICD-10-CM | POA: Diagnosis not present

## 2020-12-09 DIAGNOSIS — H5703 Miosis: Secondary | ICD-10-CM | POA: Diagnosis not present

## 2020-12-09 DIAGNOSIS — Z87891 Personal history of nicotine dependence: Secondary | ICD-10-CM | POA: Diagnosis not present

## 2020-12-09 DIAGNOSIS — H25812 Combined forms of age-related cataract, left eye: Secondary | ICD-10-CM | POA: Diagnosis not present

## 2020-12-09 DIAGNOSIS — Z79899 Other long term (current) drug therapy: Secondary | ICD-10-CM | POA: Diagnosis not present

## 2020-12-09 HISTORY — DX: Parkinson's disease: G20

## 2020-12-09 HISTORY — DX: Parkinson's disease without dyskinesia, without mention of fluctuations: G20.A1

## 2020-12-09 HISTORY — PX: CATARACT EXTRACTION W/PHACO: SHX586

## 2020-12-09 SURGERY — PHACOEMULSIFICATION, CATARACT, WITH IOL INSERTION
Anesthesia: Monitor Anesthesia Care | Site: Eye | Laterality: Left

## 2020-12-09 MED ORDER — NA HYALUR & NA CHOND-NA HYALUR 0.4-0.35 ML IO KIT
PACK | INTRAOCULAR | Status: DC | PRN
  Administered 2020-12-09: 1 mL via INTRAOCULAR

## 2020-12-09 MED ORDER — FENTANYL CITRATE (PF) 100 MCG/2ML IJ SOLN
INTRAMUSCULAR | Status: DC | PRN
  Administered 2020-12-09: 50 ug via INTRAVENOUS

## 2020-12-09 MED ORDER — ARMC OPHTHALMIC DILATING DROPS
1.0000 "application " | OPHTHALMIC | Status: DC | PRN
  Administered 2020-12-09 (×3): 1 via OPHTHALMIC

## 2020-12-09 MED ORDER — CEFUROXIME OPHTHALMIC INJECTION 1 MG/0.1 ML
INJECTION | OPHTHALMIC | Status: DC | PRN
  Administered 2020-12-09: 0.1 mL via INTRACAMERAL

## 2020-12-09 MED ORDER — LACTATED RINGERS IV SOLN: INTRAVENOUS | Status: DC

## 2020-12-09 MED ORDER — LIDOCAINE HCL (PF) 2 % IJ SOLN
INTRAOCULAR | Status: DC | PRN
  Administered 2020-12-09: 1 mL

## 2020-12-09 MED ORDER — EPINEPHRINE PF 1 MG/ML IJ SOLN
INTRAOCULAR | Status: DC | PRN
  Administered 2020-12-09: 10:00:00 58 mL via OPHTHALMIC

## 2020-12-09 MED ORDER — TETRACAINE HCL 0.5 % OP SOLN
1.0000 [drp] | OPHTHALMIC | Status: DC | PRN
  Administered 2020-12-09 (×3): 1 [drp] via OPHTHALMIC

## 2020-12-09 MED ORDER — MIDAZOLAM HCL 2 MG/2ML IJ SOLN
INTRAMUSCULAR | Status: DC | PRN
  Administered 2020-12-09: .5 mg via INTRAVENOUS

## 2020-12-09 MED ORDER — BRIMONIDINE TARTRATE-TIMOLOL 0.2-0.5 % OP SOLN
OPHTHALMIC | Status: DC | PRN
  Administered 2020-12-09: 1 [drp] via OPHTHALMIC

## 2020-12-09 SURGICAL SUPPLY — 23 items
CANNULA ANT/CHMB 27G (MISCELLANEOUS) ×1 IMPLANT
CANNULA ANT/CHMB 27GA (MISCELLANEOUS) ×2 IMPLANT
GLOVE SURG LX 7.5 STRW (GLOVE) ×1
GLOVE SURG LX STRL 7.5 STRW (GLOVE) ×1 IMPLANT
GLOVE SURG TRIUMPH 8.0 PF LTX (GLOVE) ×2 IMPLANT
GOWN STRL REUS W/ TWL LRG LVL3 (GOWN DISPOSABLE) ×2 IMPLANT
GOWN STRL REUS W/TWL LRG LVL3 (GOWN DISPOSABLE) ×4
LENS IOL TECNIS EYHANCE 19.5 (Intraocular Lens) ×1 IMPLANT
MARKER SKIN DUAL TIP RULER LAB (MISCELLANEOUS) ×2 IMPLANT
NDL CAPSULORHEX 25GA (NEEDLE) ×1 IMPLANT
NDL FILTER BLUNT 18X1 1/2 (NEEDLE) ×2 IMPLANT
NEEDLE CAPSULORHEX 25GA (NEEDLE) ×2 IMPLANT
NEEDLE FILTER BLUNT 18X 1/2SAF (NEEDLE) ×2
NEEDLE FILTER BLUNT 18X1 1/2 (NEEDLE) ×2 IMPLANT
PACK CATARACT BRASINGTON (MISCELLANEOUS) ×2 IMPLANT
PACK EYE AFTER SURG (MISCELLANEOUS) ×2 IMPLANT
PACK OPTHALMIC (MISCELLANEOUS) ×2 IMPLANT
RING MALYGIN (MISCELLANEOUS) ×1 IMPLANT
SOLUTION OPHTHALMIC SALT (MISCELLANEOUS) ×2 IMPLANT
SYR 3ML LL SCALE MARK (SYRINGE) ×4 IMPLANT
SYR TB 1ML LUER SLIP (SYRINGE) ×2 IMPLANT
WATER STERILE IRR 250ML POUR (IV SOLUTION) ×2 IMPLANT
WIPE NON LINTING 3.25X3.25 (MISCELLANEOUS) ×2 IMPLANT

## 2020-12-09 NOTE — Anesthesia Preprocedure Evaluation (Signed)
Anesthesia Evaluation  Patient identified by MRN, date of birth, ID band Patient awake    Airway Mallampati: II  TM Distance: >3 FB Neck ROM: Full    Dental no notable dental hx.    Pulmonary former smoker,    Pulmonary exam normal        Cardiovascular Exercise Tolerance: Good negative cardio ROS Normal cardiovascular exam     Neuro/Psych Dementia Parkinson's disease    GI/Hepatic negative GI ROS, Neg liver ROS,   Endo/Other  negative endocrine ROS  Renal/GU negative Renal ROS     Musculoskeletal   Abdominal   Peds  Hematology negative hematology ROS (+)   Anesthesia Other Findings   Reproductive/Obstetrics                             Anesthesia Physical Anesthesia Plan  ASA: III  Anesthesia Plan: MAC   Post-op Pain Management:    Induction: Intravenous  PONV Risk Score and Plan: 1 and Midazolam, TIVA and Treatment may vary due to age or medical condition  Airway Management Planned: Nasal Cannula and Natural Airway  Additional Equipment: None  Intra-op Plan:   Post-operative Plan:   Informed Consent: I have reviewed the patients History and Physical, chart, labs and discussed the procedure including the risks, benefits and alternatives for the proposed anesthesia with the patient or authorized representative who has indicated his/her understanding and acceptance.       Plan Discussed with: CRNA  Anesthesia Plan Comments:         Anesthesia Quick Evaluation

## 2020-12-09 NOTE — Transfer of Care (Signed)
Immediate Anesthesia Transfer of Care Note  Patient: Stephen Stein  Procedure(s) Performed: CATARACT EXTRACTION PHACO AND INTRAOCULAR LENS PLACEMENT ([facility]) LEFT 6.94 01:09.7 9.9%  (Left Eye)  Patient Location: PACU  Anesthesia Type: MAC  Level of Consciousness: awake, alert  and patient cooperative  Airway and Oxygen Therapy: Patient Spontanous Breathing and Patient connected to supplemental oxygen  Post-op Assessment: Post-op Vital signs reviewed, Patient's Cardiovascular Status Stable, Respiratory Function Stable, Patent Airway and No signs of Nausea or vomiting  Post-op Vital Signs: Reviewed and stable  Complications: No complications documented.

## 2020-12-09 NOTE — Anesthesia Postprocedure Evaluation (Signed)
Anesthesia Post Note  Patient: Stephen Stein  Procedure(s) Performed: CATARACT EXTRACTION PHACO AND INTRAOCULAR LENS PLACEMENT ([facility]) LEFT 6.94 01:09.7 9.9%  (Left Eye)     Patient location during evaluation: PACU Anesthesia Type: MAC Level of consciousness: awake and alert Pain management: pain level controlled Vital Signs Assessment: post-procedure vital signs reviewed and stable Respiratory status: spontaneous breathing Cardiovascular status: blood pressure returned to baseline Postop Assessment: no apparent nausea or vomiting, adequate PO intake and no headache Anesthetic complications: no   No complications documented.  Adele Barthel [name]

## 2020-12-09 NOTE — Op Note (Signed)
OPERATIVE NOTE  Stephen Stein [id number] 12/09/2020  PREOPERATIVE DIAGNOSIS:   Nuclear sclerotic cataract left eye with miotic pupil      H25.12   POSTOPERATIVE DIAGNOSIS:   Nuclear sclerotic cataract left eye with miotic pupil.     PROCEDURE:  Phacoemulsification with posterior chamber intraocular lens implantation of the left eye which required pupil stretching with the Malyugin pupil expansion device  Ultrasound time: Procedure(s): CATARACT EXTRACTION PHACO AND INTRAOCULAR LENS PLACEMENT ([facility]) LEFT 6.94 01:09.7 9.9%  (Left)  LENS:   Implant Name Type Inv. Item Serial No. Manufacturer Lot No. LRB No. Used Action  LENS IOL TECNIS EYHANCE 19.5 - [id number] Intraocular Lens LENS IOL TECNIS EYHANCE 19.5 [id number] JOHNSON   Left 1 Implanted          SURGEON:  Wyonia Hough, MD   ANESTHESIA: Topical with tetracaine drops and 2% Xylocaine jelly, augmented with 1% preservative-free intracameral lidocaine.   COMPLICATIONS:  None.   DESCRIPTION OF PROCEDURE:  The patient was identified in the holding room and transported to the operating room and placed in the supine position under the operating microscope.  The left eye was identified as the operative eye and it was prepped and draped in the usual sterile ophthalmic fashion.   A 1 millimeter clear-corneal paracentesis was made at the 1:30 position.  The anterior chamber was filled with Viscoat viscoelastic.  0.5 ml of preservative-free 1% lidocaine was injected into the anterior chamber.  A 2.4 millimeter keratome was used to make a near-clear corneal incision at the 10:30 position.  A Malyugin pupil expander was then placed through the main incision and into the anterior chamber of the eye.  The edge of the iris was secured on the lip of the pupil expander and it was released, thereby expanding the pupil to approximately 6 millimeters for completion of the cataract surgery.  Additional Viscoat was placed in the anterior chamber.  A  cystotome and capsulorrhexis forceps were used to make a curvilinear capsulorrhexis.   Balanced salt solution was used to hydrodissect and hydrodelineate the lens nucleus.   Phacoemulsification was used in stop and chop fashion to remove the lens, nucleus and epinucleus.  The remaining cortex was aspirated using the irrigation aspiration handpiece.  Additional Provisc was placed into the eye to distend the capsular bag for lens placement.  A lens was then injected into the capsular bag.  The pupil expanding ring was removed using a Kuglen hook and insertion device. The remaining viscoelastic was aspirated from the capsular bag and the anterior chamber.  The anterior chamber was filled with balanced salt solution to inflate to a physiologic pressure.   Wounds were hydrated with balanced salt solution.  The anterior chamber was inflated to a physiologic pressure with balanced salt solution.  No wound leaks were noted. Cefuroxime 0.1 ml of a 10mg /ml solution was injected into the anterior chamber for a dose of 1 mg of intracameral antibiotic at the completion of the case.   Timolol and Brimonidine drops were applied to the eye.  The patient was taken to the recovery room in stable condition without complications of anesthesia or surgery.  [name] 12/09/2020, 9:47 AM

## 2020-12-09 NOTE — H&P (Signed)

## 2020-12-09 NOTE — Anesthesia Procedure Notes (Signed)
Procedure Name: MAC Date/Time: 12/09/2020 9:31 AM Performed by: Silvana Newness, CRNA Pre-anesthesia Checklist: Patient identified, Emergency Drugs available, Suction available, Patient being monitored and Timeout performed Patient Re-evaluated:Patient Re-evaluated prior to induction Oxygen Delivery Method: Nasal cannula Placement Confirmation: positive ETCO2

## 2020-12-10 ENCOUNTER — Encounter: Payer: Self-pay | Admitting: Ophthalmology

## 2020-12-14 ENCOUNTER — Other Ambulatory Visit: Payer: Self-pay

## 2020-12-14 ENCOUNTER — Encounter: Payer: Self-pay | Admitting: Ophthalmology

## 2020-12-21 ENCOUNTER — Other Ambulatory Visit
Admission: RE | Admit: 2020-12-21 | Discharge: 2020-12-21 | Disposition: A | Discharge status: Home / Self Care | Payer: PPO | Source: Ambulatory Visit | Attending: Ophthalmology | Admitting: Ophthalmology

## 2020-12-21 ENCOUNTER — Other Ambulatory Visit: Payer: Self-pay

## 2020-12-21 DIAGNOSIS — M25512 Pain in left shoulder: Secondary | ICD-10-CM | POA: Diagnosis not present

## 2020-12-21 DIAGNOSIS — Z20822 Contact with and (suspected) exposure to covid-19: Secondary | ICD-10-CM | POA: Insufficient documentation

## 2020-12-21 DIAGNOSIS — Z01812 Encounter for preprocedural laboratory examination: Secondary | ICD-10-CM | POA: Insufficient documentation

## 2020-12-21 DIAGNOSIS — M25612 Stiffness of left shoulder, not elsewhere classified: Secondary | ICD-10-CM | POA: Diagnosis not present

## 2020-12-21 LAB — SARS CORONAVIRUS 2 (TAT 6-24 HRS): SARS Coronavirus 2: NEGATIVE

## 2020-12-22 ENCOUNTER — Other Ambulatory Visit: Payer: PPO

## 2020-12-22 NOTE — Discharge Instructions (Signed)

## 2020-12-22 NOTE — Anesthesia Preprocedure Evaluation (Addendum)
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   Neuro/Psych PSYCHIATRIC DISORDERS Depression  Neuromuscular disease (Parkinson disease)    GI/Hepatic negative GI ROS,   Endo/Other  negative endocrine ROS  Renal/GU negative Renal ROS     Musculoskeletal   Abdominal   Peds  Hematology negative hematology ROS (+)   Anesthesia Other Findings   Reproductive/Obstetrics                            Anesthesia Physical Anesthesia Plan  ASA: III  Anesthesia Plan: MAC   Post-op Pain Management:    Induction: Intravenous  PONV Risk Score and Plan: 1 and TIVA, Midazolam and Treatment may vary due to age or medical condition  Airway Management Planned: Nasal Cannula  Additional Equipment:   Intra-op Plan:   Post-operative Plan:   Informed Consent: I have reviewed the patients History and Physical, chart, labs and discussed the procedure including the risks, benefits and alternatives for the proposed anesthesia with the patient or authorized representative who has indicated his/her understanding and acceptance.       Plan Discussed with: CRNA  Anesthesia Plan Comments:        Anesthesia Quick Evaluation

## 2020-12-23 ENCOUNTER — Encounter: Payer: Self-pay | Admitting: Ophthalmology

## 2020-12-23 ENCOUNTER — Encounter
Admission: RE | Disposition: A | Discharge status: Home / Self Care | Payer: Self-pay | Source: Home / Self Care | Attending: Ophthalmology

## 2020-12-23 ENCOUNTER — Other Ambulatory Visit: Payer: Self-pay

## 2020-12-23 ENCOUNTER — Ambulatory Visit: Payer: PPO | Admitting: Anesthesiology

## 2020-12-23 ENCOUNTER — Ambulatory Visit
Admission: RE | Admit: 2020-12-23 | Discharge: 2020-12-23 | Disposition: A | Discharge status: Home / Self Care | Payer: PPO | Attending: Ophthalmology | Admitting: Ophthalmology

## 2020-12-23 DIAGNOSIS — H5703 Miosis: Secondary | ICD-10-CM | POA: Insufficient documentation

## 2020-12-23 DIAGNOSIS — H25811 Combined forms of age-related cataract, right eye: Secondary | ICD-10-CM | POA: Diagnosis not present

## 2020-12-23 DIAGNOSIS — Z87891 Personal history of nicotine dependence: Secondary | ICD-10-CM | POA: Insufficient documentation

## 2020-12-23 DIAGNOSIS — Z79899 Other long term (current) drug therapy: Secondary | ICD-10-CM | POA: Diagnosis not present

## 2020-12-23 DIAGNOSIS — H2511 Age-related nuclear cataract, right eye: Secondary | ICD-10-CM | POA: Diagnosis not present

## 2020-12-23 HISTORY — PX: CATARACT EXTRACTION W/PHACO: SHX586

## 2020-12-23 SURGERY — PHACOEMULSIFICATION, CATARACT, WITH IOL INSERTION
Anesthesia: Monitor Anesthesia Care | Laterality: Right

## 2020-12-23 MED ORDER — CEFUROXIME OPHTHALMIC INJECTION 1 MG/0.1 ML
INJECTION | OPHTHALMIC | Status: DC | PRN
  Administered 2020-12-23: 0.1 mL via INTRACAMERAL

## 2020-12-23 MED ORDER — ARMC OPHTHALMIC DILATING DROPS
1.0000 "application " | OPHTHALMIC | Status: DC | PRN
  Administered 2020-12-23 (×3): 1 via OPHTHALMIC

## 2020-12-23 MED ORDER — EPINEPHRINE PF 1 MG/ML IJ SOLN
INTRAOCULAR | Status: DC | PRN
  Administered 2020-12-23: 12:00:00 73 mL via OPHTHALMIC

## 2020-12-23 MED ORDER — BRIMONIDINE TARTRATE-TIMOLOL 0.2-0.5 % OP SOLN
OPHTHALMIC | Status: DC | PRN
  Administered 2020-12-23: 1 [drp] via OPHTHALMIC

## 2020-12-23 MED ORDER — LACTATED RINGERS IV SOLN: INTRAVENOUS | Status: DC

## 2020-12-23 MED ORDER — LIDOCAINE HCL (PF) 2 % IJ SOLN
INTRAOCULAR | Status: DC | PRN
  Administered 2020-12-23: 12:00:00 1 mL

## 2020-12-23 MED ORDER — NA HYALUR & NA CHOND-NA HYALUR 0.4-0.35 ML IO KIT
PACK | INTRAOCULAR | Status: DC | PRN
  Administered 2020-12-23: 1 mL via INTRAOCULAR

## 2020-12-23 MED ORDER — TETRACAINE HCL 0.5 % OP SOLN
1.0000 [drp] | OPHTHALMIC | Status: DC | PRN
  Administered 2020-12-23 (×3): 1 [drp] via OPHTHALMIC

## 2020-12-23 MED ORDER — FENTANYL CITRATE (PF) 100 MCG/2ML IJ SOLN
INTRAMUSCULAR | Status: DC | PRN
  Administered 2020-12-23: 50 ug via INTRAVENOUS
  Administered 2020-12-23 (×2): 25 ug via INTRAVENOUS

## 2020-12-23 MED ORDER — MIDAZOLAM HCL 2 MG/2ML IJ SOLN
INTRAMUSCULAR | Status: DC | PRN
  Administered 2020-12-23: 2 mg via INTRAVENOUS

## 2020-12-23 MED ORDER — ONDANSETRON HCL 4 MG/2ML IJ SOLN: 4.0000 mg | Freq: Once | INTRAMUSCULAR | Status: DC | PRN

## 2020-12-23 MED ORDER — ACETAMINOPHEN 160 MG/5ML PO SOLN: 325.0000 mg | ORAL | Status: DC | PRN

## 2020-12-23 MED ORDER — ACETAMINOPHEN 325 MG PO TABS: 650.0000 mg | ORAL_TABLET | Freq: Once | ORAL | Status: DC | PRN

## 2020-12-23 SURGICAL SUPPLY — 28 items
CANNULA ANT/CHMB 27G (MISCELLANEOUS) ×1 IMPLANT
CANNULA ANT/CHMB 27GA (MISCELLANEOUS) ×2 IMPLANT
GLOVE SURG LX 7.5 STRW (GLOVE) ×2
GLOVE SURG LX STRL 7.5 STRW (GLOVE) ×1 IMPLANT
GLOVE SURG TRIUMPH 8.0 PF LTX (GLOVE) ×2 IMPLANT
GOWN STRL REUS W/ TWL LRG LVL3 (GOWN DISPOSABLE) ×2 IMPLANT
GOWN STRL REUS W/TWL LRG LVL3 (GOWN DISPOSABLE) ×4
LENS IOL TECNIS EYHANCE 18.5 (Intraocular Lens) ×1 IMPLANT
MARKER SKIN DUAL TIP RULER LAB (MISCELLANEOUS) ×2 IMPLANT
NDL CAPSULORHEX 25GA (NEEDLE) ×1 IMPLANT
NDL FILTER BLUNT 18X1 1/2 (NEEDLE) ×2 IMPLANT
NDL RETROBULBAR .5 NSTRL (NEEDLE) IMPLANT
NEEDLE CAPSULORHEX 25GA (NEEDLE) ×2 IMPLANT
NEEDLE FILTER BLUNT 18X 1/2SAF (NEEDLE) ×2
NEEDLE FILTER BLUNT 18X1 1/2 (NEEDLE) ×2 IMPLANT
PACK CATARACT BRASINGTON (MISCELLANEOUS) ×2 IMPLANT
PACK EYE AFTER SURG (MISCELLANEOUS) ×2 IMPLANT
PACK OPTHALMIC (MISCELLANEOUS) ×2 IMPLANT
RING MALYGIN 7.0 (MISCELLANEOUS) ×1 IMPLANT
SOLUTION OPHTHALMIC SALT (MISCELLANEOUS) ×2 IMPLANT
SUT ETHILON 10-0 CS-B-6CS-B-6 (SUTURE)
SUT VICRYL  9 0 (SUTURE)
SUT VICRYL 9 0 (SUTURE) IMPLANT
SUTURE EHLN 10-0 CS-B-6CS-B-6 (SUTURE) IMPLANT
SYR 3ML LL SCALE MARK (SYRINGE) ×4 IMPLANT
SYR TB 1ML LUER SLIP (SYRINGE) ×2 IMPLANT
WATER STERILE IRR 250ML POUR (IV SOLUTION) ×2 IMPLANT
WIPE NON LINTING 3.25X3.25 (MISCELLANEOUS) ×2 IMPLANT

## 2020-12-23 NOTE — Anesthesia Postprocedure Evaluation (Signed)
Anesthesia Post Note  Patient: Stephen Stein  Procedure(s) Performed: CATARACT EXTRACTION PHACO AND INTRAOCULAR LENS PLACEMENT ([place]) RIGHT MALYUGIN (Right )     Patient location during evaluation: PACU Anesthesia Type: MAC Level of consciousness: awake and alert, oriented and patient cooperative Pain management: pain level controlled Vital Signs Assessment: post-procedure vital signs reviewed and stable Respiratory status: spontaneous breathing, nonlabored ventilation and respiratory function stable Cardiovascular status: blood pressure returned to baseline and stable Postop Assessment: adequate PO intake Anesthetic complications: no   No complications documented.  Darrin Nipper

## 2020-12-23 NOTE — Transfer of Care (Signed)
Immediate Anesthesia Transfer of Care Note  Patient: Stephen Stein  Procedure(s) Performed: CATARACT EXTRACTION PHACO AND INTRAOCULAR LENS PLACEMENT ([place]) RIGHT MALYUGIN (Right )  Patient Location: PACU  Anesthesia Type: MAC  Level of Consciousness: awake, alert  and patient cooperative  Airway and Oxygen Therapy: Patient Spontanous Breathing and Patient connected to supplemental oxygen  Post-op Assessment: Post-op Vital signs reviewed, Patient's Cardiovascular Status Stable, Respiratory Function Stable, Patent Airway and No signs of Nausea or vomiting  Post-op Vital Signs: Reviewed and stable  Complications: No complications documented.

## 2020-12-23 NOTE — Op Note (Signed)
OPERATIVE NOTE  Stephen Stein [id number] 12/23/2020   PREOPERATIVE DIAGNOSIS:    Nuclear Sclerotic Cataract Right eye with miotic pupil.        H25.11  POSTOPERATIVE DIAGNOSIS: Nuclear Sclerotic Cataract Right eye with miotic pupil.          PROCEDURE:  Phacoemusification with posterior chamber intraocular lens placement of the right eye which required pupil stretching with the Malyugin pupil expansion device. Ultrasound time: Procedure(s) with comments: CATARACT EXTRACTION PHACO AND INTRAOCULAR LENS PLACEMENT ([facility]) RIGHT MALYUGIN (Right) - 8.44 1:14.6 11.3%  LENS:   Implant Name Type Inv. Item Serial No. Manufacturer Lot No. LRB No. Used Action  LENS IOL TECNIS EYHANCE 18.5 - [id number] Intraocular Lens LENS IOL TECNIS EYHANCE 18.5 [id number] JOHNSON   Right 1 Implanted     SURGEON:  Deirdre Evener, MD   ANESTHESIA:  Topical with tetracaine drops and 2% Xylocaine jelly, augmented with 1% preservative-free intracameral lidocaine.   COMPLICATIONS:  None.   DESCRIPTION OF PROCEDURE:  The patient was identified in the holding room and transported to the operating room and placed in the supine position under the operating microscope. Theright eye was identified as the operative eye and it was prepped and draped in the usual sterile ophthalmic fashion.   A 1 millimeter clear-corneal paracentesis was made at the 12:00 position.  0.5 ml of preservative-free 1% lidocaine was injected into the anterior chamber. The anterior chamber was filled with Viscoat viscoelastic.  A 2.4 millimeter keratome was used to make a near-clear corneal incision at the 9:00 position. A Malyugin pupil expander was then placed through the main incision and into the anterior chamber of the eye.  The edge of the iris was secured on the lip of the pupil expander and it was released, thereby expanding the pupil to approximately 7 millimeters for completion of the cataract surgery.  Additional Viscoat was placed  in the anterior chamber.  A cystotome and capsulorrhexis forceps were used to make a curvilinear capsulorrhexis.   Balanced salt solution was used to hydrodissect and hydrodelineate the lens nucleus.   Phacoemulsification was used in stop and chop fashion to remove the lens, nucleus and epinucleus.  The remaining cortex was aspirated using the irrigation aspiration handpiece.  Additional Provisc was placed into the eye to distend the capsular bag for lens placement.  A lens was then injected into the capsular bag.  The pupil expanding ring was removed using a Kuglen hook and insertion device. The remaining viscoelastic was aspirated from the capsular bag and the anterior chamber.  The anterior chamber was filled with balanced salt solution to inflate to a physiologic pressure.  Wounds were hydrated with balanced salt solution.  The anterior chamber was inflated to a physiologic pressure with balanced salt solution.  No wound leaks were noted.Cefuroxime 0.1 ml of a 10mg /ml solution was injected into the anterior chamber for a dose of 1 mg of intracameral antibiotic at the completion of the case. Timolol and Brimonidine drops were applied to the eye.  The patient was taken to the recovery room in stable condition without complications of anesthesia or surgery.  [name] 12/23/2020, 11:50 AM

## 2020-12-23 NOTE — H&P (Signed)

## 2020-12-24 ENCOUNTER — Encounter: Payer: Self-pay | Admitting: Ophthalmology

## 2021-01-21 DIAGNOSIS — Z23 Encounter for immunization: Secondary | ICD-10-CM | POA: Diagnosis not present

## 2021-02-16 ENCOUNTER — Other Ambulatory Visit: Payer: Self-pay | Admitting: Family Medicine

## 2021-02-16 DIAGNOSIS — R399 Unspecified symptoms and signs involving the genitourinary system: Secondary | ICD-10-CM

## 2021-03-29 DIAGNOSIS — M19011 Primary osteoarthritis, right shoulder: Secondary | ICD-10-CM | POA: Diagnosis not present

## 2021-03-30 DIAGNOSIS — R4189 Other symptoms and signs involving cognitive functions and awareness: Secondary | ICD-10-CM | POA: Diagnosis not present

## 2021-03-30 DIAGNOSIS — R2689 Other abnormalities of gait and mobility: Secondary | ICD-10-CM | POA: Diagnosis not present

## 2021-03-30 DIAGNOSIS — G20 Parkinson's disease: Secondary | ICD-10-CM | POA: Diagnosis not present

## 2021-03-30 DIAGNOSIS — I10 Essential (primary) hypertension: Secondary | ICD-10-CM | POA: Diagnosis not present

## 2021-04-20 ENCOUNTER — Encounter: Payer: Self-pay | Admitting: Cardiovascular Disease

## 2021-04-20 ENCOUNTER — Ambulatory Visit (INDEPENDENT_AMBULATORY_CARE_PROVIDER_SITE_OTHER): Payer: PPO | Admitting: Family Medicine

## 2021-04-20 ENCOUNTER — Other Ambulatory Visit: Payer: Self-pay

## 2021-04-20 ENCOUNTER — Encounter: Payer: Self-pay | Admitting: Family Medicine

## 2021-04-20 VITALS — BP 118/62 | HR 65 | Temp 97.9°F | Resp 16 | Ht 70.0 in | Wt 214.3 lb

## 2021-04-20 DIAGNOSIS — Z01818 Encounter for other preprocedural examination: Secondary | ICD-10-CM

## 2021-04-20 DIAGNOSIS — D692 Other nonthrombocytopenic purpura: Secondary | ICD-10-CM | POA: Insufficient documentation

## 2021-04-20 DIAGNOSIS — F320 Major depressive disorder, single episode, mild: Secondary | ICD-10-CM | POA: Insufficient documentation

## 2021-04-20 DIAGNOSIS — I25118 Atherosclerotic heart disease of native coronary artery with other forms of angina pectoris: Secondary | ICD-10-CM

## 2021-04-20 DIAGNOSIS — R6889 Other general symptoms and signs: Secondary | ICD-10-CM | POA: Diagnosis not present

## 2021-04-20 DIAGNOSIS — I441 Atrioventricular block, second degree: Secondary | ICD-10-CM

## 2021-04-20 DIAGNOSIS — E782 Mixed hyperlipidemia: Secondary | ICD-10-CM

## 2021-04-20 DIAGNOSIS — G20 Parkinson's disease: Secondary | ICD-10-CM | POA: Diagnosis not present

## 2021-04-20 DIAGNOSIS — R7303 Prediabetes: Secondary | ICD-10-CM

## 2021-04-20 DIAGNOSIS — I10 Essential (primary) hypertension: Secondary | ICD-10-CM | POA: Diagnosis not present

## 2021-04-20 DIAGNOSIS — R399 Unspecified symptoms and signs involving the genitourinary system: Secondary | ICD-10-CM

## 2021-04-20 DIAGNOSIS — R5383 Other fatigue: Secondary | ICD-10-CM | POA: Diagnosis not present

## 2021-04-20 DIAGNOSIS — Z5181 Encounter for therapeutic drug level monitoring: Secondary | ICD-10-CM

## 2021-04-20 DIAGNOSIS — Z1159 Encounter for screening for other viral diseases: Secondary | ICD-10-CM

## 2021-04-20 DIAGNOSIS — R001 Bradycardia, unspecified: Secondary | ICD-10-CM | POA: Diagnosis not present

## 2021-04-20 DIAGNOSIS — I700 Atherosclerosis of aorta: Secondary | ICD-10-CM

## 2021-04-20 DIAGNOSIS — D821 Di George's syndrome: Secondary | ICD-10-CM | POA: Insufficient documentation

## 2021-04-20 MED ORDER — TAMSULOSIN HCL 0.4 MG PO CAPS
0.4000 mg | ORAL_CAPSULE | Freq: Every day | ORAL | 3 refills | Status: DC
Start: 2021-04-20 — End: 2022-05-10

## 2021-04-20 NOTE — Progress Notes (Signed)
Name: Stephen Stein   MRN: [id number]    DOB: 1941/09/24   Date:04/20/2021       Progress Note  Chief Complaint  Patient presents with  . Follow-up  . Hyperlipidemia  . Hypertension     Subjective:   Stephen Stein is a 80 y.o. male, presents to clinic for rotuine f/up  Sept shoulder surgery - needs pre op clearance for shoulder replacement surgery Did other shoulder 07/2019 - no paperwork in hand today Last time he had forms and needed cardiology clearance as well. Ortho wanted PCP to be aware of mobic use - pt regularly manages pain with tylenol 500 mg TID prn and when he does not physical activity or shoulder pain flares up he holds tylenol and will use mobic 7.5 mg BID prn for a week, then switch back No GI sx Good renal function - labs reviewed below, will recheck today and monitor, currently no other meds managing pain Lab Results  Component Value Date   CREATININE 1.08 10/20/2020   Lab Results  Component Value Date   BUN 20 10/20/2020   Lab Results  Component Value Date   GFRNONAA 65 10/20/2020     Pt states he is doing overall well, he notes possible small progression of parkinson's sx, which he is seeing neuro for and on sinemet.  He gets "swimmy headed" with exertion, cognitive changes/forgetfulness, no recent falls  HR slow during exam today - reviewed last VS and last several ECG's Pulse Readings from Last 3 Encounters:  04/20/21 81  12/23/20 73  12/09/20 73  On exam HR 53  Pt denies syncope, CP, SOB, palpitations, diaphoresis He has very poor exercise tolerance- feels near syncopal/swimmy headed Previously had rhythm issues and was on atenolol, but he states those resolved Pt of Dr. Donivan Scull Hx of PSVT, LBBB, 1st degree block and 2nd degree block in the past that resolved ECHO reviewed: Moderate LVH, LVEF 45-50%, diffuse hypokinesis - grade 1 DD Study Conclusions   - Left ventricle: The cavity size was normal. Wall thickness was  increased in a  pattern of moderate LVH. Systolic function was  mildly reduced. The estimated ejection fraction was in the range  of 45% to 50%. Diffuse hypokinesis. Doppler parameters are  consistent with abnormal left ventricular relaxation (grade 1  diastolic dysfunction).  - Aortic valve: There was trivial regurgitation.  - Aortic root: The aortic root was mildly dilated.  - Right ventricle: The cavity size was normal. Systolic function  was mildly reduced.   Heart Monitor results from 12/16/209:  Rise Mu, PA-C  12/16/2018 1:30 PM EST      Heart monitor showed NSR with an average heart rate of 57 bpm. Minimum heart rate of 28 bpm. LBBB seen (known since 06/2018). Patient had 2 runs of NSVT vs SVT with the fastest being 4 beats with a max rate of 164 bpm and the longest lasting 13 beats with an average rate of 121 bpm. One single patient triggered event was associated Mobitiz type I. Idioventricular rhythm was present. QRS morphology was noted to change during the study.   Did patient not trigger the device or was he only symptomatic with one episode?  Patient was noted in 2016 to have multi-vessel coronary artery calcium on calcium score CT. Chest CT from 2018 showed significant coronary artery calcium of the left main, LAD, LCx, and RCA.   Given patient's symptoms of dizziness, new LBBB dating back to 06/2018, NSVT vs SVT,  idioventricular rhythm, and QRS morphology changes in the setting of a low EF I recommend LHC and referral to EP.   Please schedule patient to see me this week to discuss the above. Please go ahead and refer to EP for the above. I will discuss cath risks and benefits in the office with him.   Will forward to Dr. Rockey Situ for [place].   Hyperlipidemia: Currently treated with crestor, pt reports good med compliance Last Lipids: Lab Results  Component Value Date   CHOL 131 10/20/2020   HDL 37 (L) 10/20/2020   LDLCALC 64 10/20/2020   TRIG 241 (H) 10/20/2020    CHOLHDL 3.5 10/20/2020   - Denies: Chest pain, shortness of breath, myalgias, claudication  Prediabetes: Recent pertinent labs: Lab Results  Component Value Date   HGBA1C 5.7 (H) 10/20/2020   HGBA1C 5.8 (H) 08/07/2018   HGBA1C 5.6 10/19/2017   Hypertension:  Currently managed on diet/lifestyle BP w initial VS 120/62 Blood pressure today is well controlled. BP Readings from Last 3 Encounters:  04/20/21 118/62  12/23/20 115/68  12/09/20 120/79   Pt denies CP, SOB, LE edema, palpitation, Ha's, visual disturbances, hypotension, syncope.  Depression screen [facility] 2/9 04/20/2021 12/03/2020 10/20/2020  Decreased Interest 1 0 1  Down, Depressed, Hopeless 1 0 1  PHQ - 2 Score 2 0 2  Altered sleeping 1 - -  Tired, decreased energy 1 - -  Change in appetite 0 - -  Feeling bad or failure about yourself  0 - -  Trouble concentrating 1 - -  Moving slowly or fidgety/restless 0 - -  Suicidal thoughts 0 - -  PHQ-9 Score 5 - -  Difficult doing work/chores Somewhat difficult - -  Some recent data might be hidden   On zoloft 50 mg daily - pt states mood is good, phq reviewed and mildly positive - waking up to urinate, doesn't feel suicidal or down/depressed    Current Outpatient Medications:  .  acetaminophen (TYLENOL) 500 MG tablet, Take 1,000 mg by mouth every 6 (six) hours as needed for moderate pain., Disp: , Rfl:  .  azelastine (ASTELIN) 0.1 % nasal spray, Place 2 sprays into both nostrils 2 (two) times daily., Disp: , Rfl:  .  glucosamine-chondroitin 500-400 MG tablet, Take 1 tablet by mouth daily., Disp: , Rfl:  .  meloxicam (MOBIC) 7.5 MG tablet, Take 0.5-1 tablets (3.75-7.5 mg total) by mouth daily as needed for pain., Disp: , Rfl:  .  Multiple Vitamin (MULTIVITAMIN WITH MINERALS) TABS tablet, Take 1 tablet by mouth daily., Disp: , Rfl:  .  rosuvastatin (CRESTOR) 10 MG tablet, TAKE 1 TABLET BY MOUTH EVERYDAY AT BEDTIME, Disp: 90 tablet, Rfl: 3 .  sertraline (ZOLOFT) 100 MG tablet,  Take 100 mg by mouth daily., Disp: , Rfl:  .  tamsulosin (FLOMAX) 0.4 MG CAPS capsule, TAKE 1 CAPSULE BY MOUTH EVERYDAY AT BEDTIME, Disp: 90 capsule, Rfl: 1 .  vitamin B-12 (CYANOCOBALAMIN) 1000 MCG tablet, Take 1,000 mcg by mouth daily. , Disp: , Rfl:  .  vitamin C (ASCORBIC ACID) 500 MG tablet, Take 500 mg by mouth daily., Disp: , Rfl:  .  carbidopa-levodopa (SINEMET CR) 50-200 MG tablet, Take by mouth., Disp: , Rfl:  .  carbidopa-levodopa (SINEMET IR) 25-100 MG tablet, Take 1.5 tablets by mouth 3 (three) times daily., Disp: , Rfl:  .  donepezil (ARICEPT) 10 MG tablet, Take 10 mg by mouth at bedtime. (Patient not taking: Reported on 04/20/2021), Disp: , Rfl:  Patient Active Problem List   Diagnosis Date Noted  . NSVT (nonsustained ventricular tachycardia) ([place]) 10/20/2020  . Parkinson's disease ([place]) 10/20/2020  . Status post total shoulder replacement, left 09/11/2019  . Carotid atherosclerosis, bilateral 02/20/2019  . Vitamin B12 deficiency 10/15/2018  . Memory impairment 10/12/2018  . Hx of major depression 10/12/2018  . Protein-calorie malnutrition, severe 08/20/2018  . Left bundle branch block 06/25/2018  . Second degree AV block, Mobitz type I 06/25/2018  . Aortic calcification ([place]) 06/15/2018  . Adrenal adenoma 05/09/2017  . Pulmonary nodules 04/19/2017  . BPH (benign prostatic hyperplasia) 04/19/2017  . 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 Coronary artery disease of native artery of native heart with stable angina pectoris ([place]) 03/25/2016  . Hyperlipidemia 07/07/2015  . Essential hypertension 07/07/2015    Past Surgical History:  Procedure Laterality Date  . CATARACT EXTRACTION W/PHACO Left 12/09/2020   Procedure: CATARACT EXTRACTION PHACO AND INTRAOCULAR LENS PLACEMENT (IOC) LEFT 6.94 01:09.7 9.9% ;  Surgeon: Leandrew Koyanagi, MD;  Location: [place];  Service: Ophthalmology;  Laterality: Left;  . CATARACT EXTRACTION W/PHACO Right 12/23/2020    Procedure: CATARACT EXTRACTION PHACO AND INTRAOCULAR LENS PLACEMENT ([place]) RIGHT MALYUGIN;  Surgeon: Leandrew Koyanagi, MD;  Location: [place];  Service: Ophthalmology;  Laterality: Right;  8.44 1:14.6 11.3%  . CHOLECYSTECTOMY N/A 09/13/2018   Procedure: LAPAROSCOPIC CHOLECYSTECTOMY;  Surgeon: Jules Husbands, MD;  Location: [facility];  Service: General;  Laterality: N/A;  . COLONOSCOPY  2008  . ESOPHAGOGASTRODUODENOSCOPY (EGD) WITH PROPOFOL N/A 08/20/2018   Procedure: ESOPHAGOGASTRODUODENOSCOPY (EGD) WITH PROPOFOL;  Surgeon: Lucilla Lame, MD;  Location: [facility]COPY;  Service: Endoscopy;  Laterality: N/A;  . TONSILLECTOMY AND ADENOIDECTOMY  1955  . TOTAL SHOULDER ARTHROPLASTY Left 09/11/2019   Procedure: TOTAL SHOULDER ARTHROPLASTY;  Surgeon: Lovell Sheehan, MD;  Location: [facility];  Service: Orthopedics;  Laterality: Left;    Family History  Problem Relation Age of Onset  . Cancer Mother        lung  . Heart disease Father   . Hearing loss Sister        coclear implant  . Hearing loss Brother   . Cancer Paternal Grandmother   . Cancer Son        colon cancer  . Colon cancer Neg Hx   . Stomach cancer Neg Hx     Social History   Tobacco Use  . Smoking status: Former Smoker    Packs/day: 1.50    Years: 35.00    Pack years: 52.50    Types: Cigarettes    Quit date: 06/22/1992    Years since quitting: 28.8  . Smokeless tobacco: Former Systems developer    Quit date: 06/22/1992  Vaping Use  . Vaping Use: Never used  Substance Use Topics  . Alcohol use: Yes    Alcohol/week: 1.0 standard drink    Types: 1 Cans of beer per week    Comment: occasional  . Drug use: No     No Known Allergies  Health Maintenance  Topic Date Due  . Hepatitis C Screening  Never done  . COVID-19 Vaccine (2 - Moderna risk 4-dose series) 02/04/2020  . TETANUS/TDAP  06/20/2021  . INFLUENZA VACCINE  07/26/2021  . PNA vac Low Risk Adult  Completed  . HPV VACCINES  Aged Out    Chart Review  Today: I personally reviewed active problem list, medication list, allergies, family history, social history, health maintenance, notes from last encounter, lab results, imaging with  the patient/caregiver today.   Review of Systems  All other systems reviewed and are negative for acute change except as noted in the HPI.  Objective:   Vitals:   04/20/21 1042  BP: 120/62  Pulse: 81  Resp: 16  Temp: 97.9 F (36.6 C)  SpO2: 99%  Weight: 214 lb 4.8 oz (97.2 kg)  Height: 5\' 10"  (1.778 m)    Body mass index is 30.75 kg/m.  Physical Exam Vitals and nursing note reviewed.  Constitutional:      General: He is not in acute distress.    Appearance: Normal appearance. He is well-developed. He is obese. He is not ill-appearing, toxic-appearing or diaphoretic.     Interventions: Face mask in place.  HENT:     Head: Normocephalic and atraumatic.     Jaw: No trismus.     Right Ear: External ear normal.     Left Ear: External ear normal.  Eyes:     General: Lids are normal. No scleral icterus.       Right eye: No discharge.        Left eye: No discharge.     Conjunctiva/sclera: Conjunctivae normal.  Neck:     Trachea: Trachea and phonation normal. No tracheal deviation.  Cardiovascular:     Rate and Rhythm: Bradycardia present. Rhythm regularly irregular.     Pulses: Normal pulses.          Radial pulses are 2+ on the right side and 2+ on the left side.     Heart sounds: No murmur heard. No friction rub. No gallop.   Pulmonary:     Effort: Pulmonary effort is normal. No respiratory distress.     Breath sounds: Normal breath sounds. No stridor. No wheezing, rhonchi or rales.  Abdominal:     General: Bowel sounds are normal. There is no distension.     Palpations: Abdomen is soft.     Tenderness: There is no abdominal tenderness. There is no guarding.  Musculoskeletal:     Right lower leg: No edema.     Left lower leg: No edema.  Skin:    General: Skin is warm and dry.      Coloration: Skin is not jaundiced.     Findings: No rash.     Nails: There is no clubbing.  Neurological:     Mental Status: He is alert. Mental status is at baseline.     Cranial Nerves: No dysarthria or facial asymmetry.     Motor: No tremor or abnormal muscle tone.     Gait: Gait normal.  Psychiatric:        Mood and Affect: Mood normal.        Speech: Speech normal.        Behavior: Behavior normal. Behavior is cooperative.     ECG interpretation   Date: 04/20/21  Rate: 42  Rhythm: sinus rhythm with 2nd degree a-v block  QRS Axis: left axis  Intervals: abnormal - 2nd degree A-V block mobitz type 1   ST/T Wave abnormalities: normal - no ST elevation or depression noted, no T-wave abnormalities noted  Conduction Disutrbances:  LBBB, 2nd degree heart block - mobitz type 1   Old EKG Reviewed:  Reviewed 01/24/2019 change - was LBBB with 1st degree block rate 72, 04/24/2020 change - sinus rhythm with 1st degree block and LBBB rate 64 -  significant changes noted       Assessment & Plan:     ICD-10-CM  1. Lower urinary tract symptoms (LUTS)  R39.9 tamsulosin (FLOMAX) 0.4 MG CAPS capsule    Urinalysis, Routine w reflex microscopic   nighttime waking 3-4 x some nights, but not every night, he doesn't want to change meds, will work on lifestyle   2. Mixed hyperlipidemia  99991111 COMPLETE METABOLIC PANEL WITH GFR   last labs reviewed, well controlled, good crestor compliance w/o SE or concerns  3. Essential hypertension  99991111 COMPLETE METABOLIC PANEL WITH GFR   BP well controlled today and at goal, not currently on any meds   4. Preop general physical exam  Z01.818 CBC with Differential/Platelet    COMPLETE METABOLIC PANEL WITH GFR    Hemoglobin A1c    Urinalysis, Routine w reflex microscopic    Albumin   no paperwork - labs ordered, pt will need cardiac clearance as well for upcoming surgery  5. Bradycardia  0000000 COMPLETE METABOLIC PANEL WITH GFR    Magnesium    TSH    EKG  12-Lead   HR slow and irregular today in exam room, last several VS HR normal - ECG done, HR improves with mild activity/ambulation, BP normal   6. Second degree heart block  [id number] COMPLETE METABOLIC PANEL WITH GFR    Magnesium    TSH   2nd degree - Mobitz 1 - hx of the same - reached out to cardiology, new relative to last several ECGs, but pt did have in the past 2019- see other note for plan  7. 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 Parkinson's disease ([place]) Chronic G20    managed by neurology, possible slow progression of sx reported by pt, however pt looks better than past visits - chart and meds reviewed  9. Aortic calcification (HCC) Chronic I70.0    on statin, monitoring  10. Coronary artery disease of native artery of native heart with stable angina pectoris (HCC) Chronic I25.118    poor endurance and exertional fatigue/near syncope, no CP, diaphoresis, SOB - follows with cardiology - Dr. Rockey Situ appt tomorrow  11. Medication monitoring encounter  Z51.81 CBC with Differential/Platelet    COMPLETE METABOLIC PANEL WITH GFR    Hemoglobin A1c    Urinalysis, Routine w reflex microscopic  12. Prediabetes  R73.03 Hemoglobin A1c  13. Current mild episode of major depressive disorder, unspecified whether recurrent (HCC)  F32.0    on zoloft, pt pleasant, no concerns with meds, phq reviewed, mildly positive, but mood good, no SI  14. Encounter for hepatitis C screening test for low risk patient  Z11.59 Hepatitis C antibody   Abnormal ECG and vital signs today, added roughly 20 min more to visit to do ECG, review more of chart hx/cardiac history, add additional note, communicate with pt's cardiologist and arrange a follow up plan. More than 45 min spent with the pt today in the exam room for appointment for routine follow up, preop clearance, and irregular HR/bradycardia and 2nd degree heart block. 15+ chart review, and 15+ for  chart documentation. Personally reviewed multiple ECGs and cardiac testing, cardiology results/notes/visits as noted above. Pt was ensured that he was hemodynamically stable with repeated VS and with return and ER precautions prior to him leaving today with his wife, who was waiting in the waiting room.     Return in about 6 months (around 10/20/2021) for Routine follow-up.   Delsa Grana, PA-C 04/20/21 10:59 AM

## 2021-04-20 NOTE — Patient Instructions (Signed)
Your heart rate has been slow today, but it seems to speed up when you get active and your blood pressure has been good - just don't hesitate or wait to get checked out if you feel faint, short of breath, have chest pain or clammy cold sweats or if your heart rate is <50 with some of these symptoms.  Follow up with Dr. Rockey Situ tomorrow.     Second-Degree Atrioventricular Block Second-degree atrioventricular (AV) block is a condition that can cause the heart to miss beats. In this condition, the electrical signals that travel from the heart's upper chambers (atria) to its lower chambers (ventricles) move too slowly or are interrupted. There are two types of second-degree AV block:  Mobitz type 1 block. This type does not cause symptoms. It rarely requires treatment.  Mobitz type 2 block. This type is more serious. It results in more missed beats and a very irregular heartbeat. It can lead to third-degree AV block, or complete heart block, meaning that no signal exists between the atria and ventricles. Mobitz type 2 is a dangerous condition that can lead to fainting or cardiac arrest. Mobitz type 2 block often requires a pacemaker. What are the causes? This condition may be caused by:  Any condition that damages the electrical pathway that controls the heart's rate and rhythm, such as a heart attack or infection of the heart.  Overstimulation of the nerve that slows down the heart rate (vagus nerve). This cause is common among well-conditioned athletes.  Some medicines that slow down the heart rate, such as beta blockers or calcium channel blockers.  Surgery that damages the heart. Some people are born with this condition (congenital heart block), but most people develop it over time. What increases the risk? The risk for this condition increases with age. You are also more likely to develop this condition if you have:  A history of heart attack.  Heart failure.  Coronary heart  disease.  Inflammation of heart muscle (myocarditis).  Disease of heart muscle (cardiomyopathy).  Infection of the heart valves (endocarditis).  Infections or diseases that affect the heart. These include: ? Lyme disease. ? Sarcoidosis. ? Hemochromatosis. ? Rheumatic fever. ? Certain muscle disorders. Babies are more likely to be born with heart block if:  The baby's mother has an autoimmune disease, such as lupus.  The baby is born with a heart defect that affects the heart's structure.  A parent was born with a heart defect. What are the signs or symptoms? Symptoms of this condition include:  Tiredness.  Shortness of breath.  Dizziness.  Light-headedness.  Fainting.  Chest pain. How is this diagnosed? This condition may be diagnosed based on:  A physical exam.  Your medical history.  A measurement of your pulse or heartbeat.  Tests. These may include: ? An electrocardiogram (ECG). This checks for problems with electrical activity in your heart. ? Ambulatory cardiac monitoring. This is a portable ECG that you wear. It checks your heart's rhythm. ? An electrophysiology (EP) study. Long, thin tubes (catheters) are placed in your heart. The catheters give information about your heart's electrical signals. How is this treated? Treatment for this condition depends on the type of block you have and how severe it is. Treatment may involve:  Treating an underlying condition, such as heart disease.  Changing or stopping any heart medicines that may have caused heart block.  Having a permanent pacemaker placed in your chest. A pacemaker uses electrical pulses to help the heart beat  normally. It is usually placed under the skin on your chest. You will likely need a pacemaker if you have Mobitz type 2 block. Follow these instructions at home: Alcohol use  Do not drink alcohol if: ? Your health care provider tells you not to drink. ? You are pregnant, may be pregnant,  or are planning to become pregnant.  If you drink alcohol: ? Limit how much you use to:  0-1 drink a day for women.  0-2 drinks a day for men. ? Be aware of how much alcohol is in your drink. In the [place] one drink equals one 12 oz bottle of beer (355 mL), one 5 oz glass of wine (148 mL), or one 1 oz glass of hard liquor (44 mL). General instructions  Take over-the-counter and prescription medicines only as told by your health care provider.  Follow your health care provider's recommendations to help reduce your risk of heart disease. These may include: ? Exercising for at least 30 minutes on 5 or more days each week (150 minutes). Ask your health care provider what type of exercise is safe for you. ? Eating a heart-healthy diet with fruits and vegetables, whole grains, low-fat dairy products, and lean proteins like poultry and eggs. Your health care provider or dietitian can help you make healthy choices. ? Maintaining a healthy weight.  Do not use any products that contain nicotine or tobacco, such as cigarettes, e-cigarettes. and chewing tobacco. If you need help quitting, ask your health care provider.  Keep all follow-up visits as told by your health care provider. This is important.   Where to find more information  American Heart Association: www.heart.org  National Heart, Lung, and Blood Institute: https://wilson-eaton.com/ Contact a health care provider if you:  Feel like your heart is skipping beats.  Feel more tired than normal.  Have swelling in your lower legs or your feet. Get help right away if you:  Have symptoms that change or get worse.  Develop new symptoms.  Have chest pain, especially if the pain: ? Feels like crushing or pressure. ? Spreads to your arms, back, neck, or jaw.  Feel short of breath.  Feel light-headed or weak.  Faint. These symptoms may represent a serious problem that is an emergency. Do not wait to see if the symptoms will go away. Get  medical help right away. Call your local emergency services (911 in the [place] Do not drive yourself to the hospital. Summary  Second-degree atrioventricular (AV) block is a type of heart block that can cause the heart to miss beats. In this condition, the electrical signals that control heart rate move too slowly or are interrupted.  You will likely need a pacemaker if you have the more serious type of AV block (Mobitz type 2 block).  Get help right away if you have symptoms that change or get worse, have chest pain, feel short of breath or light-headed or weak, faint, or develop new symptoms. This information is not intended to replace advice given to you by your health care provider. Make sure you discuss any questions you have with your health care provider. Document Revised: 10/21/2019 Document Reviewed: 10/21/2019 Elsevier Patient Education  [place].

## 2021-04-20 NOTE — Progress Notes (Signed)
HR during exam slow - bradycardic 53 manual  ECG done - rate 42, sinus rhythm with 2nd degree heart block, Mobitz 1 - [place] Pt's HR higher when entering clinic and getting VS - 81, when seated on exam table and at rest rate 52-60's, layed down for ECG rate 42  Sent secure message to Dr. Rockey Situ about ECG -  Further chart review - noted he had same dx July 2019, ECG reviewed Pt's BP and HR rechecked prior to leaving exam room today - VSS Vitals:   04/20/21 1042 04/20/21 1150  BP: 120/62 118/62  Pulse: 81 65  Temp: 97.9 F (36.6 C)   Resp: 16   Height: 5\' 10"  (1.778 m)   Weight: 214 lb 4.8 oz (97.2 kg)   SpO2: 99%   BMI (Calculated): 30.75    Reviewed with pt and his wife ER precautions - handout on AVS given and reviewed, they verbalized understanding.  Delsa Grana, PA-C 04/20/21 12:16 PM Pt ambulated out of clinic after getting labs done, ambulating independently, pleasant, chatting, NAD, reviewed AVS with him again as he went to check out. No response through private messenger from Dr. Rockey Situ - explained that pt has f/up, no hypotension, VSS, pt asx today, he does have exercise intolerance and near syncope for many months which he has been told is secondary to parkinson's.

## 2021-04-20 NOTE — Progress Notes (Signed)
Date:  04/20/2021   ID:  Stephen Stein, DOB 1941/10/21, MRN [id number]  Patient Location:  [place] [id number]   Provider location:   [facility], [place] office  PCP:  Delsa Grana, PA-C  Cardiologist:  Patsy [place]  Chief Complaint  Patient presents with  . 12 month follow up     Patient was at his PCP yesterday and was told to discuss his abnormal EKG that was performed yesterday. Patient c/o feeling fatigue. Medications reviewed by the patient verbally.      History of Present Illness:    Stephen Stein is a 80 y.o. male with  past medical history of smoking history, stopped 20 years ago,  hyperlipidemia,  palpitations, treated with atenolol,  CT coronary calcium score with score  800,  Minor carotid intimal thickening and atherosclerosis. stress testing showing no significant ischemia  who presents for routine follow-up Of his coronary artery disease   Seen yesterday by primary care, noted to have bradycardia, EKG showing secondary AV block type I, left bundle branch block, rate in the 40s  On evaluation today reports no significant change in the way he feels, still active Has chronic fatigue which has been ongoing for several years  Previously seen by Dr. 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  Prior CV studies:   The following studies were reviewed today:  Echocardiogram October 09, 2018 Ejection fraction 45 to 50%, moderate LVH  Stress test October 17, 2018 No significant ischemia, fraction estimated 65% Left bundle branch block  CT coronary calcium score,800 He has three-vessel coronary artery disease, no significant aortic atherosclerosis   Past Medical History:  Diagnosis Date  . 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 Biliary dyskinesia    a. 08/2018 s/p lap chole.  [name] BPH (benign prostatic hyperplasia)   . Carotid atherosclerosis, bilateral    a. 01/2019 Carotid U/S: <50% bilat ICA stenosis.  . Coronary artery calcification seen on CT scan    a. 11/2015 Cardiac CT: Cor Ca2+ = 892 (80th %'ile). Diffuse Ca2+ in all 3 major coronary arteries; b. 02/2016 MV: HTN response to exercise. Abnl ecg w/ horizontal inflat ST depression, but no ischemia/infarct on imaging; c. 09/2018 MV: EF 64%, no ischemia. Very sm region of mild distal antsept defect, likely 2/2 lbbb.  . Depression   . Hx of basal cell carcinoma 08/01/2019   R chest parasternal  . Hyperlipidemia   . Hypertension    For irregular heart beat  . Irregular heartbeat/Palpitations    a. Has been taking Atenolol for 20 years  . LBBB (left bundle branch block)   . Left ventricular hypokinesis    a. 09/2018 Echo: EF 45-50%, mod LVH, diff HK. Gr1 DD. Triv  AI. Mildly dil Ao root. Mildly reduced RV fxn.  . Lightheadedness   . NSVT (nonsustained ventricular tachycardia) ([place])    a. 11/2018 Zio monitor - 13 beats of NSVT--asymptomatic.  . Orthostatic hypotension    a. 11/2018 pressures improved w/ midodrine/florinef.  . Parkinson's disease ([place])   . Sinus bradycardia    a. 11/2018 Zio monitor: Avg HR 57 (min 28). Intermittent Mobitz I.   Past Surgical History:  Procedure Laterality Date  . CATARACT EXTRACTION  W/PHACO Left 12/09/2020   Procedure: CATARACT EXTRACTION PHACO AND INTRAOCULAR LENS PLACEMENT (IOC) LEFT 6.94 01:09.7 9.9% ;  Surgeon: Leandrew Koyanagi, MD;  Location: [place];  Service: Ophthalmology;  Laterality: Left;  . CATARACT EXTRACTION W/PHACO Right 12/23/2020   Procedure: CATARACT EXTRACTION PHACO AND INTRAOCULAR LENS PLACEMENT ([place]) RIGHT MALYUGIN;  Surgeon: Leandrew Koyanagi, MD;  Location: [place];  Service: Ophthalmology;  Laterality: Right;  8.44 1:14.6 11.3%  . CHOLECYSTECTOMY N/A 09/13/2018   Procedure: LAPAROSCOPIC CHOLECYSTECTOMY;  Surgeon: Jules Husbands, MD;  Location: [facility];  Service: General;  Laterality: N/A;  . COLONOSCOPY  2008  . ESOPHAGOGASTRODUODENOSCOPY (EGD) WITH PROPOFOL N/A 08/20/2018   Procedure: ESOPHAGOGASTRODUODENOSCOPY (EGD) WITH PROPOFOL;  Surgeon: Lucilla Lame, MD;  Location: [facility];  Service: Endoscopy;  Laterality: N/A;  . TONSILLECTOMY AND ADENOIDECTOMY  1955  . TOTAL SHOULDER ARTHROPLASTY Left 09/11/2019   Procedure: TOTAL SHOULDER ARTHROPLASTY;  Surgeon: Lovell Sheehan, MD;  Location: [facility];  Service: Orthopedics;  Laterality: Left;     No outpatient medications have been marked as taking for the 04/21/21 encounter (Appointment) with Minna Merritts, MD.     Allergies:   Patient has no known allergies.   Social History   Tobacco Use  . Smoking status: Former Smoker    Packs/day: 1.50    Years: 35.00    Pack years: 52.50    Types: Cigarettes    Quit date: 06/22/1992    Years since quitting: 28.8  . Smokeless tobacco: Former Systems developer    Quit date: 06/22/1992  Vaping Use  . Vaping Use: Never used  Substance Use Topics  . Alcohol use: Yes    Alcohol/week: 1.0 standard drink    Types: 1 Cans of beer per week    Comment: occasional  . Drug use: No     Current Outpatient Medications on File Prior to Visit  Medication Sig Dispense Refill  . acetaminophen (TYLENOL) 500 MG tablet Take 1,000 mg by  mouth every 6 (six) hours as needed for moderate pain.    [name] azelastine (ASTELIN) 0.1 % nasal spray Place 2 sprays into both nostrils 2 (two) times daily.    . carbidopa-levodopa (SINEMET CR) 50-200 MG tablet Take by mouth.    . carbidopa-levodopa (SINEMET IR) 25-100 MG tablet Take 1.5 tablets by mouth 3 (three) times daily.    [name] donepezil (ARICEPT) 10 MG tablet Take 10 mg by mouth at bedtime. (Patient not taking: Reported on 04/20/2021)    . glucosamine-chondroitin 500-400 MG tablet Take 1 tablet by mouth daily.    . meloxicam (MOBIC) 7.5 MG tablet Take 0.5-1 tablets (3.75-7.5 mg total) by mouth daily as needed for pain.    . Multiple Vitamin (MULTIVITAMIN WITH MINERALS) TABS tablet Take 1 tablet by mouth daily.    . rosuvastatin (CRESTOR) 10 MG tablet TAKE 1 TABLET BY MOUTH EVERYDAY AT BEDTIME 90 tablet 3  . sertraline (ZOLOFT) 100 MG tablet Take 100 mg by mouth daily.    . tamsulosin (FLOMAX)  0.4 MG CAPS capsule Take 1 capsule (0.4 mg total) by mouth at bedtime. 90 capsule 3  . vitamin B-12 (CYANOCOBALAMIN) 1000 MCG tablet Take 1,000 mcg by mouth daily.     . vitamin C (ASCORBIC ACID) 500 MG tablet Take 500 mg by mouth daily.     No current facility-administered medications on file prior to visit.     Family Hx: The patient's family history includes Cancer in his mother, paternal grandmother, and son; Hearing loss in his brother and sister; Heart disease in his father. There is no history of Colon cancer or Stomach cancer.  ROS:   Please see the history of present illness.    Review of Systems  Constitutional: Negative.   Respiratory: Negative.   Cardiovascular: Negative.   Gastrointestinal: Negative.   Musculoskeletal: Negative.   Neurological: Positive for dizziness.  Psychiatric/Behavioral: Negative.   All other systems reviewed and are negative.    Labs/Other Tests and Data Reviewed:    Recent Labs: 10/20/2020: ALT 8; BUN 20; Creat 1.08; Potassium 5.2; Sodium 139 04/20/2021:  Hemoglobin 13.6; Platelets 218   Recent Lipid Panel Lab Results  Component Value Date/Time   CHOL 131 10/20/2020 11:12 AM   CHOL 110 06/01/2016 08:40 AM   TRIG 241 (H) 10/20/2020 11:12 AM   HDL 37 (L) 10/20/2020 11:12 AM   HDL 39 (L) 06/01/2016 08:40 AM   CHOLHDL 3.5 10/20/2020 11:12 AM   LDLCALC 64 10/20/2020 11:12 AM    Wt Readings from Last 3 Encounters:  04/20/21 214 lb 4.8 oz (97.2 kg)  12/23/20 205 lb (93 kg)  12/09/20 208 lb (94.3 kg)     Exam:   BP 110/60 (BP Location: Left Arm, Patient Position: Sitting, Cuff Size: Normal)   Pulse (!) 49   Ht 5\' 10"  (1.778 m)   Wt 214 lb 8 oz (97.3 kg)   SpO2 98%   BMI 30.78 kg/m  Constitutional:  oriented to person, place, and time. No distress.  HENT:  Head: Grossly normal Eyes:  no discharge. No scleral icterus.  Neck: No JVD, no carotid bruits  Cardiovascular: Regular rate and rhythm, no murmurs appreciated Pulmonary/Chest: Clear to auscultation bilaterally, no wheezes or rails Abdominal: Soft.  no distension.  no tenderness.  Musculoskeletal: Normal range of motion Neurological:  normal muscle tone. Coordination normal. 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 Caryl Comes for periodic evaluation Reports he is leaving town for 6 weeks Recommend he be on the look out for worsening dizziness, near syncope symptoms Repeat zio could be ordered with a phone call  Mixed hyperlipidemia - Plan: EKG 12-Lead On Crestor  Numbers at goal  Coronary artery disease involving native coronary artery of native heart without angina pectoris - Plan: EKG 12-Lead Prior stress test with no significant  ischemia Continue current medications  Dizziness Neurologic issue, previously treated with sertraline and clonazepam Notes from neurology indicating cognitive issues, Parkinson's No syncope, or near syncope    Total encounter time more than 25 minutes  Greater than 50% was spent in counseling and coordination of care with the patient   Signed, Ida Rogue, MD  04/20/2021 7:03 PM    [place]  Office [address], [place], [place] [address]

## 2021-04-21 ENCOUNTER — Ambulatory Visit: Payer: PPO | Admitting: Cardiovascular Disease

## 2021-04-21 ENCOUNTER — Encounter: Payer: Self-pay | Admitting: Cardiovascular Disease

## 2021-04-21 VITALS — BP 110/60 | HR 49 | Ht 70.0 in | Wt 214.5 lb

## 2021-04-21 DIAGNOSIS — F0280 Dementia in other diseases classified elsewhere without behavioral disturbance: Secondary | ICD-10-CM | POA: Diagnosis not present

## 2021-04-21 DIAGNOSIS — I10 Essential (primary) hypertension: Secondary | ICD-10-CM

## 2021-04-21 DIAGNOSIS — R001 Bradycardia, unspecified: Secondary | ICD-10-CM

## 2021-04-21 DIAGNOSIS — G309 Alzheimer's disease, unspecified: Secondary | ICD-10-CM

## 2021-04-21 DIAGNOSIS — E782 Mixed hyperlipidemia: Secondary | ICD-10-CM | POA: Diagnosis not present

## 2021-04-21 DIAGNOSIS — F0150 Vascular dementia without behavioral disturbance: Secondary | ICD-10-CM | POA: Diagnosis not present

## 2021-04-21 DIAGNOSIS — I25118 Atherosclerotic heart disease of native coronary artery with other forms of angina pectoris: Secondary | ICD-10-CM

## 2021-04-21 DIAGNOSIS — R42 Dizziness and giddiness: Secondary | ICD-10-CM

## 2021-04-21 NOTE — Patient Instructions (Signed)
Medication Instructions:  No changes  If you need a refill on your cardiac medications before your next appointment, please call your pharmacy.    Lab work: No new labs needed   If you have labs (blood work) drawn today and your tests are completely normal, you will receive your results only by: . MyChart Message (if you have MyChart) OR . A paper copy in the mail If you have any lab test that is abnormal or we need to change your treatment, we will call you to review the results.   Testing/Procedures: No new testing needed   Follow-Up: At [facility], you and your health needs are our priority.  As part of our continuing mission to provide you with exceptional heart care, we have created designated Provider Care Teams.  These Care Teams include your primary Cardiologist (physician) and Advanced Practice Providers (APPs -  Physician Assistants and Nurse Practitioners) who all work together to provide you with the care you need, when you need it.  . You will need a follow up appointment in 6 months  . Providers on your designated Care Team:   . [name], NP . [name], PA-C . Jacquelyn Visser, PA-C  Any Other Special Instructions Will Be Listed Below (If Applicable).  COVID-19 Vaccine Information can be found at: [email or web address] For questions related to vaccine distribution or appointments, please email [email or web address] or call [phone].     

## 2021-04-21 NOTE — Addendum Note (Signed)
Addended by: Delsa Grana on: 04/21/2021 05:30 PM   Modules accepted: Orders

## 2021-04-22 LAB — CBC WITH DIFFERENTIAL/PLATELET
Absolute Monocytes: 731 cells/uL (ref 200–950)
Basophils Absolute: 41 cells/uL (ref 0–200)
Basophils Relative: 0.6 %
Eosinophils Absolute: 538 cells/uL — ABNORMAL HIGH (ref 15–500)
Eosinophils Relative: 7.8 %
HCT: 40.9 % (ref 38.5–50.0)
Hemoglobin: 13.6 g/dL (ref 13.2–17.1)
Lymphs Abs: 1946 cells/uL (ref 850–3900)
MCH: 31.5 pg (ref 27.0–33.0)
MCHC: 33.3 g/dL (ref 32.0–36.0)
MCV: 94.7 fL (ref 80.0–100.0)
MPV: 10.2 fL (ref 7.5–12.5)
Monocytes Relative: 10.6 %
Neutro Abs: 3643 cells/uL (ref 1500–7800)
Neutrophils Relative %: 52.8 %
Platelets: 218 10*3/uL (ref 140–400)
RBC: 4.32 10*6/uL (ref 4.20–5.80)
RDW: 12.5 % (ref 11.0–15.0)
Total Lymphocyte: 28.2 %
WBC: 6.9 10*3/uL (ref 3.8–10.8)

## 2021-04-22 LAB — URINALYSIS, ROUTINE W REFLEX MICROSCOPIC
Bilirubin Urine: NEGATIVE
Glucose, UA: NEGATIVE
Hgb urine dipstick: NEGATIVE
Ketones, ur: NEGATIVE
Leukocytes,Ua: NEGATIVE
Nitrite: NEGATIVE
Protein, ur: NEGATIVE
Specific Gravity, Urine: 1.016 (ref 1.001–1.035)
pH: 6 (ref 5.0–8.0)

## 2021-04-22 LAB — TEST AUTHORIZATION

## 2021-04-22 LAB — COMPLETE METABOLIC PANEL WITH GFR
AG Ratio: 1.7 (calc) (ref 1.0–2.5)
ALT: 17 U/L (ref 9–46)
AST: 24 U/L (ref 10–35)
Albumin: 4.3 g/dL (ref 3.6–5.1)
Alkaline phosphatase (APISO): 21 U/L — ABNORMAL LOW (ref 35–144)
BUN: 22 mg/dL (ref 7–25)
CO2: 27 mmol/L (ref 20–32)
Calcium: 9.6 mg/dL (ref 8.6–10.3)
Chloride: 105 mmol/L (ref 98–110)
Creat: 1.12 mg/dL (ref 0.70–1.18)
GFR, Est African American: 72 mL/min/{1.73_m2} (ref >=60)
GFR, Est Non African American: 62 mL/min/{1.73_m2} (ref >=60)
Globulin: 2.6 g/dL (calc) (ref 1.9–3.7)
Glucose, Bld: 103 mg/dL — ABNORMAL HIGH (ref 65–99)
Potassium: 4.8 mmol/L (ref 3.5–5.3)
Sodium: 140 mmol/L (ref 135–146)
Total Bilirubin: 0.6 mg/dL (ref 0.2–1.2)
Total Protein: 6.9 g/dL (ref 6.1–8.1)

## 2021-04-22 LAB — HEPATITIS C ANTIBODY
Hepatitis C Ab: NONREACTIVE
SIGNAL TO CUT-OFF: 0.04 (ref <1.00)

## 2021-04-22 LAB — TEST AUTHORIZATION 2

## 2021-04-22 LAB — TSH: TSH: 4.13 mIU/L (ref 0.40–4.50)

## 2021-04-22 LAB — T4, FREE: Free T4: 1 ng/dL (ref 0.8–1.8)

## 2021-04-22 LAB — HEMOGLOBIN A1C
Hgb A1c MFr Bld: 5.6 % of total Hgb (ref <5.7)
Mean Plasma Glucose: 114 mg/dL
eAG (mmol/L): 6.3 mmol/L

## 2021-04-22 LAB — MAGNESIUM: Magnesium: 2.2 mg/dL (ref 1.5–2.5)

## 2021-04-26 ENCOUNTER — Telehealth: Payer: Self-pay | Admitting: Cardiovascular Disease

## 2021-04-26 NOTE — Telephone Encounter (Signed)
Attempted to schedule.  No ans no vm. Holding appt 6-16 with klein.

## 2021-04-26 NOTE — Telephone Encounter (Signed)
CC'ed chart message received as below from Dr. Rockey Situ from the patient's 04/21/21 office visit with him: Minna Merritts, MD  Deboraha Sprang, MD; Emily Filbert, RN Will need to follow-up with Richardson Landry  Continued secondary AV block type I rate into the 40s, documented by PMD rate 42  Claims no symptoms apart from chronic fatigue  Going out of town for 6 weeks to the [place] from May 10 to June 6  Thx  TG    I have clarified with Dr. Rockey Situ that the patient is ok to wait for a 1st available appointment with Dr. Caryl Comes.  He does not need to be seen prior to going out of town for 6 weeks.   To scheduling to please call the patient to arrange for a ROV with Dr. Caryl Comes. Last seen by SK on 01/24/19.

## 2021-05-13 NOTE — Telephone Encounter (Signed)
To scheduling to try to follow back up with the patient regarding his appointment with Dr. Caryl Comes.

## 2021-05-13 NOTE — Telephone Encounter (Signed)
Left voicemail message to call back so we can assist in getting appointment scheduled with Dr. Caryl Comes. Placed hold for 5/25 and then the next would be the end of June.

## 2021-05-13 NOTE — Telephone Encounter (Signed)
The patient has been scheduled to see Dr. Caryl Comes on 06/17/21.

## 2021-05-14 ENCOUNTER — Other Ambulatory Visit: Payer: Self-pay | Admitting: Pulmonary Disease

## 2021-06-17 ENCOUNTER — Ambulatory Visit: Payer: PPO | Admitting: Internal Medicine

## 2021-06-21 ENCOUNTER — Ambulatory Visit: Payer: PPO | Admitting: Internal Medicine

## 2021-06-21 ENCOUNTER — Encounter: Payer: Self-pay | Admitting: Internal Medicine

## 2021-06-21 ENCOUNTER — Other Ambulatory Visit: Payer: Self-pay

## 2021-06-21 VITALS — BP 108/68 | HR 47 | Ht 70.0 in | Wt 211.0 lb

## 2021-06-21 DIAGNOSIS — R42 Dizziness and giddiness: Secondary | ICD-10-CM | POA: Diagnosis not present

## 2021-06-21 DIAGNOSIS — I441 Atrioventricular block, second degree: Secondary | ICD-10-CM | POA: Diagnosis not present

## 2021-06-21 DIAGNOSIS — R001 Bradycardia, unspecified: Secondary | ICD-10-CM | POA: Diagnosis not present

## 2021-06-21 NOTE — Progress Notes (Signed)
Patient Care Team: Delsa Grana, PA-C as PCP - General (Family Medicine) Minna Merritts, MD as PCP - Cardiology (Cardiology) Minna Merritts, MD as Consulting Physician (Cardiology) Vladimir Crofts, MD as Consulting Physician (Neurology)   HPI  Stephen Stein is a 80 y.o. male Seen after hiatus of 2-1/2 years after he presented with the abrupt onset of nonpositional dizziness associate with neurocognitive defects and emotional lability.  He was referred to neurology.  At that time he was also appreciated to have left bundle branch block with Mobitz 1 heart block.  He was seen April 2022 and noted by primary care to have bradycardia  HR 40's with 3:2 conduction  Has had for some years now symptoms of exertional fatigue notable after mowing the lawn for 10 minutes or so comes in, staggering, with worsening of his dizziness.   Orthostatic intolerance and recurrent falls.  No heat or shower intolerance  DATE TEST EF    12/16 CT-Cardiac   % Diffuse Ca  Calcium score 892  10/19 Echo   45-50 %    10/19 Myoview  64% No Ischemia small defect 2/2 LBBB     Records and Results Reviewed   Past Medical History:  Diagnosis Date   Adrenal adenoma 05/09/2017   2 cm on scan May 2018; refer to endo   Biliary dyskinesia    a. 08/2018 s/p lap chole.   BPH (benign prostatic hyperplasia)    Carotid atherosclerosis, bilateral    a. 01/2019 Carotid U/S: <50% bilat ICA stenosis.   Coronary artery calcification seen on CT scan    a. 11/2015 Cardiac CT: Cor Ca2+ = 892 (80th %'ile). Diffuse Ca2+ in all 3 major coronary arteries; b. 02/2016 MV: HTN response to exercise. Abnl ecg w/ horizontal inflat ST depression, but no ischemia/infarct on imaging; c. 09/2018 MV: EF 64%, no ischemia. Very sm region of mild distal antsept defect, likely 2/2 lbbb.   Depression    Hx of basal cell carcinoma 08/01/2019   R chest parasternal   Hyperlipidemia    Hypertension    For irregular heart beat   Irregular  heartbeat/Palpitations    a. Has been taking Atenolol for 20 years   LBBB (left bundle branch block)    Left ventricular hypokinesis    a. 09/2018 Echo: EF 45-50%, mod LVH, diff HK. Gr1 DD. Triv AI. Mildly dil Ao root. Mildly reduced RV fxn.   Lightheadedness    NSVT (nonsustained ventricular tachycardia) ([place])    a. 11/2018 Zio monitor - 13 beats of NSVT--asymptomatic.   Orthostatic hypotension    a. 11/2018 pressures improved w/ midodrine/florinef.   Parkinson's disease ([place])    Sinus bradycardia    a. 11/2018 Zio monitor: Avg HR 57 (min 28). Intermittent Mobitz I.    Past Surgical History:  Procedure Laterality Date   CATARACT EXTRACTION W/PHACO Left 12/09/2020   Procedure: CATARACT EXTRACTION PHACO AND INTRAOCULAR LENS PLACEMENT (IOC) LEFT 6.94 01:09.7 9.9% ;  Surgeon: Leandrew Koyanagi, MD;  Location: [facility];  Service: Ophthalmology;  Laterality: Left;   CATARACT EXTRACTION W/PHACO Right 12/23/2020   Procedure: CATARACT EXTRACTION PHACO AND INTRAOCULAR LENS PLACEMENT ([place]) RIGHT MALYUGIN;  Surgeon: Leandrew Koyanagi, MD;  Location: [place];  Service: Ophthalmology;  Laterality: Right;  8.44 1:14.6 11.3%   CHOLECYSTECTOMY N/A 09/13/2018   Procedure: LAPAROSCOPIC CHOLECYSTECTOMY;  Surgeon: Jules Husbands, MD;  Location: [facility];  Service: General;  Laterality: N/A;   COLONOSCOPY  2008  ESOPHAGOGASTRODUODENOSCOPY (EGD) WITH PROPOFOL N/A 08/20/2018   Procedure: ESOPHAGOGASTRODUODENOSCOPY (EGD) WITH PROPOFOL;  Surgeon: Lucilla Lame, MD;  Location: [facility];  Service: Endoscopy;  Laterality: N/A;   TONSILLECTOMY AND ADENOIDECTOMY  1955   TOTAL SHOULDER ARTHROPLASTY Left 09/11/2019   Procedure: TOTAL SHOULDER ARTHROPLASTY;  Surgeon: Lovell Sheehan, MD;  Location: [facility];  Service: Orthopedics;  Laterality: Left;    Current Meds  Medication Sig   acetaminophen (TYLENOL) 500 MG tablet Take 1,000 mg by mouth every 6 (six) hours as needed for  moderate pain.   azelastine (ASTELIN) 0.1 % nasal spray Place 2 sprays into both nostrils 2 (two) times daily.   carbidopa-levodopa (PARCOPA) 25-100 MG disintegrating tablet Take 2 tablets by mouth 3 (three) times daily.   carbidopa-levodopa (SINEMET CR) 50-200 MG tablet Take 1 tablet by mouth at bedtime.   glucosamine-chondroitin 500-400 MG tablet Take 1 tablet by mouth daily.   meloxicam (MOBIC) 15 MG tablet Take 15 mg by mouth daily.   Multiple Vitamin (MULTIVITAMIN WITH MINERALS) TABS tablet Take 1 tablet by mouth daily.   rosuvastatin (CRESTOR) 10 MG tablet TAKE 1 TABLET BY MOUTH EVERYDAY AT BEDTIME   sertraline (ZOLOFT) 100 MG tablet Take 100 mg by mouth daily.   tamsulosin (FLOMAX) 0.4 MG CAPS capsule Take 1 capsule (0.4 mg total) by mouth at bedtime.   vitamin B-12 (CYANOCOBALAMIN) 1000 MCG tablet Take 1,000 mcg by mouth daily.     No Known Allergies    Review of Systems negative except from HPI and PMH  Physical Exam BP 108/68   Pulse (!) 47   Ht 5\' 10"  (1.778 m)   Wt 211 lb (95.7 kg)   SpO2 98%   BMI 30.28 kg/m  Well developed and well nourished in no acute distress HENT normal E scleral and icterus clear Neck Supple JVP flat; carotids brisk and full Clear to ausculation Regular rate and rhythm, no murmurs gallops or rub Soft with active bowel sounds No clubbing cyanosis  Edema Alert and oriented, grossly normal motor and sensory function Skin Warm and Dry  ECG sinus with 3: 2 Mobitz 1 heart block and underlying left bundle branch block 4/22sinus at 70 with left bundle branch block and 3: 2 Mobitz 1 heart block CrCl cannot be calculated (Patient's most recent lab result is older than the maximum 21 days allowed.).   Assessment and  Plan Neurocognitive deficits   Dizziness-non-positional   Exertional fatigue   First-degree AV block secondary AV block type I   Left bundle branch block-rate  Parkinson's  Falls-trips  The patient has bradycardia and  Mobitz 1 heart block the associated bradycardia of which seems to be worse than it was 2 years ago when I saw him, now 3: 2.  Not withstanding however, it is the patient's impression and his wife's that his symptoms are no worse over the last couple of years.  At this juncture there is no indication for pacing.  We will follow.  He may well come to pacing in the future.  His exertional fatigue is provocative and I would not expect it to be rhythm mediated.  Normally AV nodal conduction improves with exertion and Mobitz 1 heart block improves.  However, he does have left bundle branch block and there may be more diffuse conduction system disease and is expected, we will undertake treadmill testing to see if we can reproduce his symptoms.  With his Parkinson's, there could be impaired blood pressure control which is being triggered by  effort and hypotension that is the mediator of the fatigue.  His falls are concerning.  On further reflection he thinks they are more trips, and surprisingly, to the good, his orthostatic vital signs today were normal.  Left bundle branch block can also be associated with cardiomyopathy and if the treadmill work is unrevealing, we will undertake an echocardiogram to assess LV function  A quick literature search failed to identify an association between carbidopa levodopa and AV nodal heart block; there were reports from the 90s of intermittent complete heart block     Current medicines are reviewed at length with the patient today .  The patient does not have concerns regarding medicines.

## 2021-06-21 NOTE — Patient Instructions (Signed)
Medication Instructions:  Your physician recommends that you continue on your current medications as directed. Please refer to the Current Medication list given to you today.  *If you need a refill on your cardiac medications before your next appointment, please call your pharmacy*   Lab Work: None ordered.  If you have labs (blood work) drawn today and your tests are completely normal, you will receive your results only by: [place] (if you have MyChart) OR A paper copy in the mail If you have any lab test that is abnormal or we need to change your treatment, we will call you to review the results.   Testing/Procedures: Your physician has requested that you have an exercise tolerance test. For further information please visit [email or web address]. Please also follow instruction sheet, as given.   ** Please schedule on a day Dr Caryl Comes is also in the office   Follow-Up: At [facility], you and your health needs are our priority.  As part of our continuing mission to provide you with exceptional heart care, we have created designated Provider Care Teams.  These Care Teams include your primary Cardiologist (physician) and Advanced Practice Providers (APPs -  Physician Assistants and Nurse Practitioners) who all work together to provide you with the care you need, when you need it.  We recommend signing up for the patient portal called "MyChart".  Sign up information is provided on this After Visit Summary.  MyChart is used to connect with patients for Virtual Visits (Telemedicine).  Patients are able to view lab/test results, encounter notes, upcoming appointments, etc.  Non-urgent messages can be sent to your provider as well.   To learn more about what you can do with MyChart, go to [email or web address].    Your next appointment:   12 month(s)  The format for your next appointment:   In Person  Provider:   Virl Axe, MD

## 2021-06-24 ENCOUNTER — Ambulatory Visit (INDEPENDENT_AMBULATORY_CARE_PROVIDER_SITE_OTHER): Payer: PPO | Admitting: Internal Medicine

## 2021-06-24 ENCOUNTER — Other Ambulatory Visit: Payer: Self-pay

## 2021-06-24 DIAGNOSIS — I447 Left bundle-branch block, unspecified: Secondary | ICD-10-CM

## 2021-06-24 DIAGNOSIS — R001 Bradycardia, unspecified: Secondary | ICD-10-CM

## 2021-06-24 NOTE — Progress Notes (Unsigned)
Zio monitor to be mailed for

## 2021-06-24 NOTE — Progress Notes (Unsigned)
Treadmill shows some improvement in conduction with loss of Wenckebach and one-to-one conduction of the heart rate of 109

## 2021-07-13 DIAGNOSIS — M19011 Primary osteoarthritis, right shoulder: Secondary | ICD-10-CM | POA: Diagnosis not present

## 2021-07-15 ENCOUNTER — Ambulatory Visit: Payer: PPO | Admitting: Internal Medicine

## 2021-07-15 ENCOUNTER — Other Ambulatory Visit: Payer: Self-pay | Admitting: Orthopedic Surgery

## 2021-07-15 DIAGNOSIS — M19011 Primary osteoarthritis, right shoulder: Secondary | ICD-10-CM

## 2021-07-19 ENCOUNTER — Encounter: Payer: Self-pay | Admitting: Orthopedic Surgery

## 2021-07-19 ENCOUNTER — Other Ambulatory Visit: Payer: Self-pay | Admitting: Orthopedic Surgery

## 2021-07-19 NOTE — H&P (Deleted)
  The note originally documented on this encounter has been moved the the encounter in which it belongs.  

## 2021-07-19 NOTE — H&P (Signed)
NAME: Stephen Stein MRN:   [id number] DOB:   1941/11/02     HISTORY AND PHYSICAL  CHIEF COMPLAINT:  right shoulder pain  HISTORY:   Luisalberto Heimberger Harrisis a 80 y.o. male  with right  Shoulder Pain Patient complains of right shoulder pain. The symptoms began several years ago. Aggravating factors: repetitive activity. Pain is located in the right glenohumeral region. Discomfort is described as aching. Symptoms are exacerbated by repetitive movements, overhead movements, and lying on the shoulder. Evaluation to date: plain films: abnormal osteoarthritis GH . Therapy to date includes: rest, ice, avoidance of offending activity, OTC analgesics which are somewhat effective, prescription NSAIDS which are somewhat effective, home exercises which are somewhat effective, physical therapy which was somewhat effective, and corticosteroid injection which was somewhat effective.   Plan for Right total shoulder arthroplasty.  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 08/2018 s/p lap chole.   BPH (benign prostatic hyperplasia)    Carotid atherosclerosis, bilateral    a. 01/2019 Carotid U/S: <50% bilat ICA stenosis.   Coronary artery calcification seen on CT scan    a. 11/2015 Cardiac CT: Cor Ca2+ = 892 (80th %'ile). Diffuse Ca2+ in all 3 major coronary arteries; b. 02/2016 MV: HTN response to exercise. Abnl ecg w/ horizontal inflat ST depression, but no ischemia/infarct on imaging; c. 09/2018 MV: EF 64%, no ischemia. Very sm region of mild distal antsept defect, likely 2/2 lbbb.   Depression    Hx of basal cell carcinoma 08/01/2019   R chest parasternal   Hyperlipidemia    Hypertension    For irregular heart beat   Irregular heartbeat/Palpitations    a. Has been taking Atenolol for 20 years   LBBB (left bundle branch block)    Left ventricular hypokinesis    a. 09/2018 Echo: EF 45-50%, mod LVH,  diff HK. Gr1 DD. Triv AI. Mildly dil Ao root. Mildly reduced RV fxn.   Lightheadedness    NSVT (nonsustained ventricular tachycardia) ([place])    a. 11/2018 Zio monitor - 13 beats of NSVT--asymptomatic.   Orthostatic hypotension    a. 11/2018 pressures improved w/ midodrine/florinef.   Parkinson's disease ([place])    Sinus bradycardia    a. 11/2018 Zio monitor: Avg HR 57 (min 28). Intermittent Mobitz I.    PAST SURGICAL HISTORY:   Past Surgical History:  Procedure Laterality Date   CATARACT EXTRACTION W/PHACO Left 12/09/2020   Procedure: CATARACT EXTRACTION PHACO AND INTRAOCULAR LENS PLACEMENT ([facility]) LEFT 6.94 01:09.7 9.9% ;  Surgeon: Leandrew Koyanagi, MD;  Location: [facility];  Service: Ophthalmology;  Laterality: Left;   CATARACT EXTRACTION W/PHACO Right 12/23/2020   Procedure: CATARACT EXTRACTION PHACO AND INTRAOCULAR LENS PLACEMENT ([place]) RIGHT MALYUGIN;  Surgeon: Leandrew Koyanagi, MD;  Location: [facility];  Service: Ophthalmology;  Laterality: Right;  8.44 1:14.6 11.3%   CHOLECYSTECTOMY N/A 09/13/2018   Procedure: LAPAROSCOPIC CHOLECYSTECTOMY;  Surgeon: Jules Husbands, MD;  Location: [facility];  Service: General;  Laterality: N/A;   COLONOSCOPY  2008   ESOPHAGOGASTRODUODENOSCOPY (EGD) WITH PROPOFOL N/A 08/20/2018   Procedure: ESOPHAGOGASTRODUODENOSCOPY (EGD) WITH PROPOFOL;  Surgeon: Lucilla Lame, MD;  Location: [facility];  Service: Endoscopy;  Laterality: N/A;   TONSILLECTOMY AND ADENOIDECTOMY  1955   TOTAL SHOULDER ARTHROPLASTY Left 09/11/2019   Procedure: TOTAL SHOULDER ARTHROPLASTY;  Surgeon: Lovell Sheehan, MD;  Location: [facility];  Service: Orthopedics;  Laterality: Left;    MEDICATIONS:  (Not in a hospital admission)   ALLERGIES:  No Known Allergies  REVIEW OF SYSTEMS:   Negative except HPI  FAMILY HISTORY:   Family History  Problem Relation Age of Onset   Cancer Mother        lung   Heart disease Father    Hearing loss Sister         coclear implant   Hearing loss Brother    Cancer Paternal Grandmother    Cancer Son        colon cancer   Colon cancer Neg Hx    Stomach cancer Neg Hx     SOCIAL HISTORY:   reports that he quit smoking about 29 years ago. His smoking use included cigarettes. He has a 52.50 pack-year smoking history. He quit smokeless tobacco use about 29 years ago. He reports current alcohol use of about 1.0 standard drink of alcohol per week. He reports that he does not use drugs.  PHYSICAL EXAM:  General appearance: alert, cooperative, and no distress Neck: no JVD and supple, symmetrical, trachea midline Resp: clear to auscultation bilaterally GI: soft, non-tender; bowel sounds normal; no masses,  no organomegaly Extremities: extremities normal, atraumatic, no cyanosis or edema and Homans sign is negative, no sign of DVT Pulses: 2+ and symmetric Skin: Skin color, texture, turgor normal. No rashes or lesions    LABORATORY STUDIES: No results for input(s): WBC, HGB, HCT, PLT in the last 72 hours.  No results for input(s): NA, K, CL, CO2, GLUCOSE, BUN, CREATININE, CALCIUM in the last 72 hours.  STUDIES/RESULTS:  No results found.  ASSESSMENT:  End stage osteoarthritis right shoulder        Active Problems:   * No active hospital problems. *    PLAN:  Right total shoulder arthoplasty   Carlynn Spry 07/19/2021. 3:25 PM

## 2021-07-22 ENCOUNTER — Other Ambulatory Visit: Payer: Self-pay

## 2021-07-22 ENCOUNTER — Ambulatory Visit
Admission: RE | Admit: 2021-07-22 | Discharge: 2021-07-22 | Disposition: A | Discharge status: Home / Self Care | Payer: PPO | Source: Ambulatory Visit | Attending: Orthopedic Surgery | Admitting: Orthopedic Surgery

## 2021-07-22 DIAGNOSIS — M19011 Primary osteoarthritis, right shoulder: Secondary | ICD-10-CM | POA: Insufficient documentation

## 2021-07-22 DIAGNOSIS — Z01818 Encounter for other preprocedural examination: Secondary | ICD-10-CM | POA: Diagnosis not present

## 2021-07-26 ENCOUNTER — Telehealth (HOSPITAL_BASED_OUTPATIENT_CLINIC_OR_DEPARTMENT_OTHER): Payer: Self-pay | Admitting: *Deleted

## 2021-07-26 ENCOUNTER — Encounter
Admission: RE | Admit: 2021-07-26 | Discharge: 2021-07-26 | Disposition: A | Discharge status: Home / Self Care | Payer: PPO | Source: Ambulatory Visit | Attending: Orthopedic Surgery | Admitting: Orthopedic Surgery

## 2021-07-26 ENCOUNTER — Encounter: Payer: Self-pay | Admitting: Orthopedic Surgery

## 2021-07-26 ENCOUNTER — Other Ambulatory Visit: Payer: Self-pay

## 2021-07-26 NOTE — Telephone Encounter (Signed)
-----   Message from Karen Kitchens, NP sent at 07/26/2021 10:20 AM EDT ----- Regarding: Request for pre-operative cardiac clearance Request for pre-operative cardiac clearance:  1. What type of surgery is being performed?  TOTAL SHOULDER ARTHROPLASTY  2. When is this surgery scheduled?  08/02/2021  3. Are there any medications that need to be held prior to surgery? NONE  4. Practice name and name of physician performing surgery?  Performing surgeon: Dr. Kurtis Bushman, MD Requesting clearance: Honor Loh, FNP-C    5. Anesthesia type (none, local, MAC, general)? General   6. What is the office phone and fax number?   Phone: [phone] Fax: [phone]  ATTENTION: Unable to create telephone message as per your standard workflow. Directed by [facility] providers to send requests for cardiac clearance to this pool for appropriate distribution to provider covering pre-operative clearances.   Honor Loh, MSN, APRN, FNP-C, CEN [facility]  Peri-operative Services Nurse Practitioner Phone: [phone] 07/26/21 10:20 AM

## 2021-07-26 NOTE — Telephone Encounter (Signed)
   Name: Stephen Stein  DOB: 03-28-1941  MRN: [id number]   Primary Cardiologist: Ida Rogue, MD  Chart reviewed as part of pre-operative protocol coverage. Patient was contacted 07/26/2021 in reference to pre-operative risk assessment for pending surgery as outlined below.  Stephen Stein was last seen on 06/21/2021 by Dr. Caryl Comes.  Since that day, Stephen Stein has done well without any exertional chest pain or worsening shortness of breath.  Recent treadmill tolerance test obtained on 06/24/2021 was unable to be interpreted due to underlying left bundle branch block, however patient was able to achieve 4.7 METS of activity without any issue.  Given lack of new onset of chest discomfort or worsening dyspnea, he is cleared to proceed to proceed with surgery.  Note, he was recently seen by Dr. Caryl Comes for first-degree AV block and occasional bradycardia, he was felt not needing a pacemaker at this time, however during the surgery, while the patient is under general anesthesia, will need to monitor for bradycardia.  Therefore, based on ACC/AHA guidelines, the patient would be at acceptable risk for the planned procedure without further cardiovascular testing.   The patient was advised that if he develops new symptoms prior to surgery to contact our office to arrange for a follow-up visit, and he verbalized understanding.  I will route this recommendation to the requesting party via Epic fax function and remove from pre-op pool. Please call with questions.  [place], [place] 07/26/2021, 12:40 PM

## 2021-07-26 NOTE — Patient Instructions (Addendum)
Your procedure is scheduled on: 08/02/2021  Report to the Registration Desk on the 1st floor of the [facility]. To find out your arrival time, please call [phone] between 1PM - 3PM on: 07/30/2021    REMEMBER: Instructions that are not followed completely may result in serious medical risk, up to and including death; or upon the discretion of your surgeon and anesthesiologist your surgery may need to be rescheduled.  Do not eat food after midnight the night before surgery.  No gum chewing, lozengers or hard candies.  You may however, drink CLEAR liquids up to 2 hours before you are scheduled to arrive for your surgery. Do not drink anything within 2 hours of your scheduled arrival time.  Clear liquids include: - water  - apple juice without pulp - gatorade (not RED, PURPLE, OR BLUE) - black coffee or tea (Do NOT add milk or creamers to the coffee or tea) Do NOT drink anything that is not on this list.    TAKE THESE MEDICATIONS THE MORNING OF SURGERY WITH A SIP OF WATER carbidopa-levodopa (SINEMET IR) 25-100 MG tablet  2.  Zoloft     Do not take medications not on the list on day of surgery.   Follow recommendations from Cardiologist, Pulmonologist or PCP regarding stopping Aspirin, Coumadin, Plavix, Eliquis, Pradaxa, or Pletal.  One week prior to surgery: Stop Anti-inflammatories (NSAIDS) such as Advil, Aleve, Ibuprofen, Motrin, Naproxen, Naprosyn and Aspirin based products such as Excedrin, Goodys Powder, BC Powder and meloxicam (MOBIC)  Stop ANY OVER THE COUNTER supplements until after surgery like vit D3, glucosamine-chondroitin,, vitamin B-12 , Cholecalciferol (VITAMIN D3 PO)  You may however, continue to take Tylenol if needed for pain up until the day of surgery.  No Alcohol for 24 hours before or after surgery.  No Smoking including e-cigarettes for 24 hours prior to surgery.  No chewable tobacco products for at least 6 hours prior to surgery.  No nicotine patches  on the day of surgery.  Do not use any "recreational" drugs for at least a week prior to your surgery.  Please be advised that the combination of cocaine and anesthesia may have negative outcomes, up to and including death. If you test positive for cocaine, your surgery will be cancelled.  On the morning of surgery brush your teeth with toothpaste and water, you may rinse your mouth with mouthwash if you wish. Do not swallow any toothpaste or mouthwash.  Do not wear jewelry.  Do not wear lotions, powders, or perfumes.   Do not shave body from the neck down 48 hours prior to surgery just in case you cut yourself which could leave a site for infection.  Also, freshly shaved skin may become irritated if using the CHG soap.  Contact lenses, hearing aids and dentures may not be worn into surgery.  Do not bring valuables to the hospital. [facility] is not responsible for any missing/lost belongings or valuables.   Use CHG Soap or wipes as directed on instruction sheet.-provided for you   Total Shoulder Arthroplasty:  use Benzolyl Peroxide 5% Gel as directed on instruction sheet-  provided for you   Notify your doctor if there is any change in your medical condition (cold, fever, infection).  Wear comfortable clothing (specific to your surgery type) to the hospital.  After surgery, you can help prevent lung complications by doing breathing exercises.  Take deep breaths and cough every 1-2 hours. Your doctor may order a device called an Chiropodist  to help you take deep breaths.  If you are being admitted to the hospital overnight, leave your suitcase in the car. After surgery it may be brought to your room.  If you are being discharged the day of surgery, you will not be allowed to drive home. You will need a responsible adult (18 years or older) to drive you home and stay with you that night.   If you are taking public transportation, you will need to have a responsible  adult (18 years or older) with you. Please confirm with your physician that it is acceptable to use public transportation.   Please call the [facility] at [phone] if you have any questions about these instructions.  Surgery Visitation Policy:  Patients undergoing a surgery or procedure may have one family member or support person with them as long as that person is not COVID-19 positive or experiencing its symptoms.  That person may remain in the waiting area during the procedure.  Inpatient Visitation:    Visiting hours are 7 a.m. to 8 p.m. Inpatients will be allowed two visitors daily. The visitors may change each day during the patient's stay. No visitors under the age of 35. Any visitor under the age of 12 must be accompanied by an adult. The visitor must pass COVID-19 screenings, use hand sanitizer when entering and exiting the patient's room and wear a mask at all times, including in the patient's room. Patients must also wear a mask when staff or their visitor are in the room. Masking is required regardless of vaccination status.

## 2021-07-26 NOTE — Progress Notes (Signed)
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 Clinical nursing staff has updated patient's PMH/PSHx, current medication list, and drug allergies/intolerances to ensure comprehensive history available to assist in medical decision making as it pertains to the aforementioned surgical procedure and anticipated anesthetic course. Extensive review of available clinical information performed. [facility] PMH and PSHx updated with any diagnoses/procedures that  may have been inadvertently omitted during his intake with the pre-admission testing department's nursing staff.  Clinical Discussion:  Stephen Stein is a 80 y.o. male who is submitted for pre-surgical anesthesia review and clearance prior to him undergoing the above procedure. Patient is a Former Smoker (52.5 pack years; quit 05/1992). Pertinent PMH includes: CAD, left ventricular hypokinesis, LBBB, NSVT, G1DD, intermittent bradycardia/Mobitz I, aortic atherosclerosis, carotid atherosclerosis, HTN, HLD, Parkinson's disease, adrenal adenoma, BPH, mild cognitive impairment, depression, significant ETOH use.   Patient is followed by cardiology Stephen Situ, MD). He was last seen in the cardiology clinic on 04/21/2021; notes reviewed.  At the time of his clinic visit, patient was being seen for an abnormal EKG at the request of his PCP.  Patient had been seen by his PCP the day prior and noted to be significantly bradycardic with a rate in the  40s.  ECG performed in the cardiology clinic showed normal sinus rhythm with a type I second-degree AV block and LBBB.  He denied any episodes of chest pain, shortness of breath, PND, orthopnea, palpitations, or presyncope/syncope.  Patient with chronic fatigue, dizziness, and gait instability related to his underlying Parkinson's disease.  PMH significant for cardiovascular diagnoses.  Coronary CTA performed on 12/16/2015 revealed a coronary calcium score of 892, which was the 80th percentile for age and sex matched control.  Imaging revealed significant three-vessel CAD.  Myocardial perfusion imaging study performed on 03/16/2016 revealed normal left ventricular systolic function with an EF of 69%.  There were >1 mm segments of ST depression noted during stress in leads II, III,, V4-V6.  Blood pressure demonstrated hypertensive response to exercise.  Study determined to be low risk.  TTE performed on 10/09/2018 revealed a mildly reduced left ventricular systolic function (LVEF 18-56%) with diffuse hypokinesis.  Doppler parameters were consistent with abnormal relaxation (G1DD).   Myocardial perfusion imaging study performed on 10/17/2018 demonstrated a normal left ventricular systolic function with an EF of 64%. There was a very small perfusion defect noted within the distal anteroseptal wall felt to be likely secondary to conduction abnormality/LBBB.  There were no wall motion abnormalities.  No EKG changes concerning for ischemia.  Scan determined to be low risk.  Long-term cardiac event monitor study performed on 12/10/2018 revealed and underlying normal sinus rhythm with an average heart rate of 57 bpm.  There were 2 runs of NSVT with the fastest lasting 4 beats at a maximum heart rate of 164, and the longest lasting 13 beats at a maximal heart rate of 121 bpm.  BILATERAL carotid Doppler study performed on 02/18/2019 revealed minor carotid intimal thickening and atherosclerosis.  There was no  hemodynamically significant ICA  stenosis noted.  Degree of narrowing <50% bilaterally by ultrasound criteria.  Exercise tolerance test was performed on 06/24/2021.  Patient remained asymptomatic throughout the stress test; no angina or anginal equivalent symptoms.  Cardiologist noted that the patient's response to exercise was adequate for his current diagnosis.  Resting heart rate 66 bpm with blood pressure 114/67.  Stress heart rate 109 bpm with a blood pressure of 190/66.  Estimated workload was 4.7 METS (see full interpretation of cardiovascular testing below).  Blood pressure well controlled at 110/60; no interventions.  Patient takes a statin for his HLD.  Patient jointly managed by electrophysiology.  Given his continued bradycardia, patient was referred to Dr. Caryl Comes for further evaluation and consideration of need for PPM placement.  Patient was seen in consult on 06/21/2021; notes reviewed.  Mobitz 1 heart block determined to be worse than it was when patient was seen 2 years prior (now 3:2).  Symptoms not felt to be rhythm mediated.  Electrophysiology noting that there is no clear indication for pacing at this time.  Functional capacity, as defined by DASI, is documented as being >/= 4 METS.  No changes were made to patient medication regimen.  Patient to continue outpatient follow-ups with cardiology/electrophysiology for ongoing management.  Patient is scheduled for a RIGHT total shoulder arthroplasty on 08/02/2021 with Dr. Kurtis Bushman, MD.  Given patient's past medical history significant for cardiovascular diagnoses, presurgical cardiac clearance was sought by the PAT team. Per cardiology, "based on patient's past medical history and time since his last clinic visit, patient would be at an overall ACCEPTABLE risk for the planned procedure without further cardiovascular testing or intervention at this time". This patient is not currently taking any type of anticoagulation/antiplatelet therapies  that would need to be held perioperatively. Of note, cardiology advising that while patient was not felt to need PPM placement at the time of his last EP visit, they are unsure of the effects general anesthesia will have on him; anticipate bradycardia. Recommending close intra/postoperative heart rate monitoring.   Patient denies previous perioperative complications with anesthesia in the past. In review of the available records, it is noted that patient underwent a MAC anesthetic course at [facility] ([facility]) in 11/2020 without documented complications.   Vitals with BMI 07/26/2021 06/21/2021 04/21/2021  Height _0  _1  _2   Weight 210 lbs 211 lbs 214 lbs 8 oz  BMI 29.3 37.48 27.07  Systolic - 867 544  Diastolic - 68 60  Pulse - 47 49    Providers/Specialists:   NOTE: Primary physician provider listed below. Patient may have been seen by APP or partner within same practice.   PROVIDER ROLE / SPECIALTY LAST OV  Lovell Sheehan, MD Orthopedics (Surgeon) 07/13/2021  Delsa Grana, PA-C Primary Care Provider 04/20/2021  Ida Rogue, MD Cardiology 04/21/2021  Virl Axe, MD Electrophysiology 06/21/2021  Jennings Books, MD Neurology 03/30/2021   Allergies:  Patient has no known allergies.  Current Home Medications:   No current facility-administered medications for this encounter.    acetaminophen (TYLENOL) 500 MG tablet   azelastine (ASTELIN) 0.1 % nasal spray   carbidopa-levodopa (SINEMET CR) 50-200 MG tablet   carbidopa-levodopa (SINEMET IR) 25-100 MG tablet   Cholecalciferol (VITAMIN D3 PO)   glucosamine-chondroitin 500-400 MG tablet   meloxicam (MOBIC) 15 MG tablet   Multiple Vitamin (MULTIVITAMIN WITH MINERALS) TABS tablet   rosuvastatin (CRESTOR) 10 MG tablet   sertraline (ZOLOFT) 100 MG tablet   tamsulosin (FLOMAX) 0.4 MG  CAPS capsule   vitamin B-12 (CYANOCOBALAMIN) 1000 MCG tablet   History:   Past Medical History:  Diagnosis Date   Adrenal  adenoma 05/09/2017   2 cm on scan May 2018; refer to endo   Aortic atherosclerosis ([place])    Biliary dyskinesia    a. 08/2018 s/p lap chole.   BPH (benign prostatic hyperplasia)    Carotid atherosclerosis, bilateral    a. 01/2019 Carotid U/S: <50% bilat ICA stenosis.   Coronary artery calcification seen on CT scan    a. 11/2015 Cardiac CT: Cor Ca2+ = 892 (80th %'ile). Diffuse Ca2+ in all 3 major coronary arteries; b. 02/2016 MV: HTN response to exercise. Abnl ecg w/ horizontal inflat ST depression, but no ischemia/infarct on imaging; c. 09/2018 MV: EF 64%, no ischemia. Very sm region of mild distal antsept defect, likely 2/2 lbbb.   Depression    Grade I diastolic dysfunction    Hepatic steatosis    Hx of basal cell carcinoma 08/01/2019   R chest parasternal   Hyperlipidemia    Hypertension    Irregular heartbeat/Palpitations    a. Has been taking Atenolol for 20 years   LBBB (left bundle branch block)    Left ventricular hypokinesis    a. 09/2018 Echo: EF 45-50%, mod LVH, diff HK. Gr1 DD. Triv AI. Mildly dil Ao root. Mildly reduced RV fxn.   Lightheadedness    Mild cognitive impairment    NSVT (nonsustained ventricular tachycardia) ([place])    a. 11/2018 Zio monitor - 13 beats of NSVT--asymptomatic.   Orthostatic hypotension    a. 11/2018 pressures improved w/ midodrine/florinef.   Parkinson's disease ([place])    Sinus bradycardia    a. 11/2018 Zio monitor: Avg HR 57 (min 28). Intermittent Mobitz I.   Past Surgical History:  Procedure Laterality Date   CATARACT EXTRACTION W/PHACO Left 12/09/2020   Procedure: CATARACT EXTRACTION PHACO AND INTRAOCULAR LENS PLACEMENT (IOC) LEFT 6.94 01:09.7 9.9% ;  Surgeon: Leandrew Koyanagi, MD;  Location: [facility];  Service: Ophthalmology;  Laterality: Left;   CATARACT EXTRACTION W/PHACO Right 12/23/2020   Procedure: CATARACT EXTRACTION PHACO AND INTRAOCULAR LENS PLACEMENT ([place]) RIGHT MALYUGIN;  Surgeon: Leandrew Koyanagi, MD;  Location:  [facility];  Service: Ophthalmology;  Laterality: Right;  8.44 1:14.6 11.3%   CHOLECYSTECTOMY N/A 09/13/2018   Procedure: LAPAROSCOPIC CHOLECYSTECTOMY;  Surgeon: Jules Husbands, MD;  Location: [facility]S;  Service: General;  Laterality: N/A;   COLONOSCOPY  2008   ESOPHAGOGASTRODUODENOSCOPY (EGD) WITH PROPOFOL N/A 08/20/2018   Procedure: ESOPHAGOGASTRODUODENOSCOPY (EGD) WITH PROPOFOL;  Surgeon: Lucilla Lame, MD;  Location: [facility];  Service: Endoscopy;  Laterality: N/A;   TONSILLECTOMY AND ADENOIDECTOMY  1955   TOTAL SHOULDER ARTHROPLASTY Left 09/11/2019   Procedure: TOTAL SHOULDER ARTHROPLASTY;  Surgeon: Lovell Sheehan, MD;  Location: [facility];  Service: Orthopedics;  Laterality: Left;   Family History  Problem Relation Age of Onset   Cancer Mother        lung   Heart disease Father    Hearing loss Sister        coclear implant   Hearing loss Brother    Cancer Paternal Grandmother    Cancer Son        colon cancer   Colon cancer Neg Hx    Stomach cancer Neg Hx    Social History   Tobacco Use   Smoking status: Former    Packs/day: 1.50    Years: 35.00    Pack years: 52.50  Types: Cigarettes    Quit date: 06/22/1992    Years since quitting: 29.1   Smokeless tobacco: Former    Quit date: 06/22/1992  Vaping Use   Vaping Use: Never used  Substance Use Topics   Alcohol use: Yes    Alcohol/week: 1.0 standard drink    Types: 1 Cans of beer per week    Comment: occasional   Drug use: No    Pertinent Clinical Results:  LABS: Labs reviewed: Acceptable for surgery.  Hospital Outpatient Visit on 07/27/2021  Component Date Value Ref Range Status   MRSA, PCR 07/27/2021 NEGATIVE  NEGATIVE Final   Staphylococcus aureus 07/27/2021 POSITIVE (A) NEGATIVE Final   Comment: (NOTE) The Xpert SA Assay (FDA approved for NASAL specimens in patients 3 years of age and older), is one component of a comprehensive surveillance program. It is not intended to diagnose infection  nor to guide or monitor treatment. Performed at [facility], [address] [place], [place] [address]    WBC 07/27/2021 7.7  4.0 - 10.5 K/uL Final   RBC 07/27/2021 4.38  4.22 - 5.81 MIL/uL Final   Hemoglobin 07/27/2021 14.1  13.0 - 17.0 g/dL Final   HCT 07/27/2021 41.1  39.0 - 52.0 % Final   MCV 07/27/2021 93.8  80.0 - 100.0 fL Final   MCH 07/27/2021 32.2  26.0 - 34.0 pg Final   MCHC 07/27/2021 34.3  30.0 - 36.0 g/dL Final   RDW 07/27/2021 12.5  11.5 - 15.5 % Final   Platelets 07/27/2021 224  150 - 400 K/uL Final   nRBC 07/27/2021 0.0  0.0 - 0.2 % Final   Performed at [facility], [place], [place] [address]   Sodium 07/27/2021 138  135 - 145 mmol/L Final   Potassium 07/27/2021 3.9  3.5 - 5.1 mmol/L Final   Chloride 07/27/2021 106  98 - 111 mmol/L Final   CO2 07/27/2021 27  22 - 32 mmol/L Final   Glucose, Bld 07/27/2021 109 (A) 70 - 99 mg/dL Final   Glucose reference range applies only to samples taken after fasting for at least 8 hours.   BUN 07/27/2021 18  8 - 23 mg/dL Final   Creatinine, Ser 07/27/2021 0.99  0.61 - 1.24 mg/dL Final   Calcium 07/27/2021 9.6  8.9 - 10.3 mg/dL Final   GFR, Estimated 07/27/2021 >60  >60 mL/min Final   Comment: (NOTE) Calculated using the CKD-EPI Creatinine Equation (2021)    Anion gap 07/27/2021 5  5 - 15 Final   Performed at [facility], [address] [place], [place] [address]   Prothrombin Time 07/27/2021 14.1  11.4 - 15.2 seconds Final   INR 07/27/2021 1.1  0.8 - 1.2 Final   Comment: (NOTE) INR goal varies based on device and disease states. Performed at [facility], [address] [place], [place] [address]    aPTT 07/27/2021 39 (A) 24 - 36 seconds Final   Comment:        IF BASELINE aPTT IS ELEVATED, SUGGEST PATIENT RISK ASSESSMENT BE USED TO DETERMINE APPROPRIATE ANTICOAGULANT THERAPY. Performed at [facility], [place], [place] [address]     Color, Urine 07/27/2021 YELLOW (A) YELLOW Final   APPearance 07/27/2021 HAZY (A) CLEAR Final   Specific Gravity, Urine 07/27/2021 1.018  1.005 - 1.030 Final   pH 07/27/2021 5.0  5.0 - 8.0 Final   Glucose, UA 07/27/2021 NEGATIVE  NEGATIVE mg/dL Final   Hgb  urine dipstick 07/27/2021 SMALL (A) NEGATIVE Final   Bilirubin Urine 07/27/2021 NEGATIVE  NEGATIVE Final   Ketones, ur 07/27/2021 5 (A) NEGATIVE mg/dL Final   Protein, ur 07/27/2021 NEGATIVE  NEGATIVE mg/dL Final   Nitrite 07/27/2021 NEGATIVE  NEGATIVE Final   Leukocytes,Ua 07/27/2021 NEGATIVE  NEGATIVE Final   RBC / HPF 07/27/2021 0-5  0 - 5 RBC/hpf Final   WBC, UA 07/27/2021 0-5  0 - 5 WBC/hpf Final   Bacteria, UA 07/27/2021 NONE SEEN  NONE SEEN Final   Squamous Epithelial / LPF 07/27/2021 0-5  0 - 5 Final   Mucus 07/27/2021 PRESENT   Final   Ca Oxalate Crys, UA 07/27/2021 PRESENT   Final   Performed at [facility], [address] [place], [place] [address]    ECG: Date: 06/21/2021 Time ECG obtained: 1135 AM Rate: 47 bpm Axis: Left axis deviation Rhythm:  Sinus bradycardia with first-degree AV block with PACs (bigeminy); LBBB Intervals: PR 464 ms. QRS 164 ms. QTc 469 ms. ST segment and T wave changes: No evidence of acute ST segment elevation or depression Comparison: Similar to previous tracing obtained on 04/21/2021   IMAGING / PROCEDURES: CT SHOULDER RIGHT WITHOUT CONTRAST performed on 07/22/2021 Advanced glenohumeral osteoarthritis Moderate acromioclavicular osteoarthritis Rotator cuff appears grossly intact  EXERCISE TOLERANCE TEST performed on 06/24/2021 Exercise following Bruce protocol No symptoms during the stress test; no angina or anginal equivalent symptoms Response to exercise was adequate for diagnosis Resting VS: 66 bpm with a blood pressure of 114/67 Stress VS: 109 bpm with a blood pressure 190/66 MPHR 141 bpm MPHR achieved 77% Estimated workload 4.7 METS  BILATERAL CAROTID DOPPLER  performed on 02/18/2019 Minor carotid intimal thickening and atherosclerosis No hemodynamically significant ICA stenosis Degree of narrowing <50% bilaterally by ultrasound criteria Patent antegrade vertebral flow bilaterally  LONG TERM CARDIAC EVENT MONITOR STUDY performed on 12/10/2018 Predominantly underlying normal sinus rhythm with an average heart rate of 57 bpm 2 runs of SVT occurred with the fastest interval lasting 4 beats at a maximum heart rate of 164 bpm, and the longest lasting 13 beats with a maximum heart rate of 121 bpm. No patient triggered event noted during NSVT episodes.  Single patient triggered event was not associated with a significant arrhythmia. Isolated SVE's were rare (<1%); no SVE couplets or triplets present Isolated VE's and VE couplets were rare (<1.0%); no VE triplets  LEXISCAN performed on 10/17/2018 Normal left ventricular systolic function with an EF of 64% Very small region of mild perfusion defect in the distal anteroseptal wall that was noted to be improved on stress images as compared to rest.  Likely secondary to conduction abnormality in the setting of known LBBB. Normal wall motion No EKG changes concerning for ischemia at peak stress during recovery Resting EKG with LBBB Low risk scan  TRANSTHORACIC ECHOCARDIOGRAM performed on 10/09/2018 Left ventricular systolic function mildly reduced with an EF of 45-50%. Left ventricular cavity size normal Left ventricular wall thickness increased in a pattern of moderate LVH Diffuse left ventricular hypokinesis Doppler parameters consistent with abnormal left ventricular relaxation (G1DD) Trivial aortic valve regurgitation Mild aortic root dilatation Mildly reduced right ventricular systolic function No evidence of pericardial effusion  LEXISCAN performed on 03/16/2016 Left ventricular systolic function normal with an EF of 69% Greater than 1 mm ST segment depression noted during stress in leads II,  II, and V4-V6 beginning at 3 minutes of stress Blood pressure demonstrated a hypertensive response to exercise Low risk scan  CORONARY  CTA performed on 12/16/2015 Three-vessel CAD Coronary calcium score of 892, which was 80th percentile for age and sex matched control  Impression and Plan:  Stephen Stein has been referred for pre-anesthesia review and clearance prior to him undergoing the planned anesthetic and procedural courses. Available labs, pertinent testing, and imaging results were personally reviewed by me. This patient has been appropriately cleared by cardiology with an overall ACCEPTABLE risk of significant perioperative cardiovascular complications.  Based on clinical review performed today (07/27/21), barring any significant acute changes in the patient's overall condition, it is anticipated that he will be able to proceed with the planned surgical intervention. Any acute changes in clinical condition may necessitate his procedure being postponed and/or cancelled. Patient will meet with anesthesia team (MD and/or CRNA) on the day of his procedure for preoperative evaluation/assessment. Questions regarding anesthetic course will be fielded at that time.   Pre-surgical instructions were reviewed with the patient during his PAT appointment and questions were fielded by PAT clinical staff. Patient was advised that if any questions or concerns arise prior to his procedure then he should return a call to PAT and/or his surgeon's office to discuss.  Honor Loh, MSN, APRN, FNP-C, CEN [facility]  Peri-operative Services Nurse Practitioner Phone: [phone] Fax: [phone] 07/27/21 2:37 PM  NOTE: This note has been prepared using Dragon dictation software. Despite my best ability to proofread, there is always the potential that unintentional transcriptional errors may still occur from this process.

## 2021-07-26 NOTE — Telephone Encounter (Signed)
1. What type of surgery is being performed?  TOTAL SHOULDER ARTHROPLASTY   2. When is this surgery scheduled?  08/02/2021     3. Are there any medications that need to be held prior to surgery?  NONE   4. Practice name and name of physician performing surgery?  Performing surgeon: Dr. Kurtis Bushman, MD  Requesting clearance: Honor Loh, FNP-C       5. Anesthesia type (none, local, MAC, general)? General   6. What is the office phone and fax number?    Phone: [phone]  Fax: [phone]

## 2021-07-27 ENCOUNTER — Encounter
Admission: RE | Admit: 2021-07-27 | Discharge: 2021-07-27 | Disposition: A | Discharge status: Home / Self Care | Payer: PPO | Source: Ambulatory Visit | Attending: Orthopedic Surgery | Admitting: Orthopedic Surgery

## 2021-07-27 DIAGNOSIS — Z01812 Encounter for preprocedural laboratory examination: Secondary | ICD-10-CM | POA: Insufficient documentation

## 2021-07-27 LAB — CBC
HCT: 41.1 % (ref 39.0–52.0)
Hemoglobin: 14.1 g/dL (ref 13.0–17.0)
MCH: 32.2 pg (ref 26.0–34.0)
MCHC: 34.3 g/dL (ref 30.0–36.0)
MCV: 93.8 fL (ref 80.0–100.0)
Platelets: 224 10*3/uL (ref 150–400)
RBC: 4.38 MIL/uL (ref 4.22–5.81)
RDW: 12.5 % (ref 11.5–15.5)
WBC: 7.7 10*3/uL (ref 4.0–10.5)
nRBC: 0 % (ref 0.0–0.2)

## 2021-07-27 LAB — URINALYSIS, ROUTINE W REFLEX MICROSCOPIC
Bacteria, UA: NONE SEEN
Bilirubin Urine: NEGATIVE
Glucose, UA: NEGATIVE mg/dL
Ketones, ur: 5 mg/dL — AB
Leukocytes,Ua: NEGATIVE
Nitrite: NEGATIVE
Protein, ur: NEGATIVE mg/dL
Specific Gravity, Urine: 1.018 (ref 1.005–1.030)
pH: 5 (ref 5.0–8.0)

## 2021-07-27 LAB — BASIC METABOLIC PANEL
Anion gap: 5 (ref 5–15)
BUN: 18 mg/dL (ref 8–23)
CO2: 27 mmol/L (ref 22–32)
Calcium: 9.6 mg/dL (ref 8.9–10.3)
Chloride: 106 mmol/L (ref 98–111)
Creatinine, Ser: 0.99 mg/dL (ref 0.61–1.24)
GFR, Estimated: >60 mL/min (ref >60)
Glucose, Bld: 109 mg/dL — ABNORMAL HIGH (ref 70–99)
Potassium: 3.9 mmol/L (ref 3.5–5.1)
Sodium: 138 mmol/L (ref 135–145)

## 2021-07-27 LAB — PROTIME-INR
INR: 1.1 (ref 0.8–1.2)
Prothrombin Time: 14.1 seconds (ref 11.4–15.2)

## 2021-07-27 LAB — SURGICAL PCR SCREEN
MRSA, PCR: NEGATIVE
Staphylococcus aureus: POSITIVE — AB

## 2021-07-27 LAB — APTT: aPTT: 39 seconds — ABNORMAL HIGH (ref 24–36)

## 2021-07-29 ENCOUNTER — Other Ambulatory Visit: Payer: Self-pay

## 2021-07-29 ENCOUNTER — Other Ambulatory Visit
Admission: RE | Admit: 2021-07-29 | Discharge: 2021-07-29 | Disposition: A | Discharge status: Home / Self Care | Payer: PPO | Source: Ambulatory Visit | Attending: Orthopedic Surgery | Admitting: Orthopedic Surgery

## 2021-07-29 DIAGNOSIS — Z20822 Contact with and (suspected) exposure to covid-19: Secondary | ICD-10-CM | POA: Insufficient documentation

## 2021-07-29 DIAGNOSIS — Z01812 Encounter for preprocedural laboratory examination: Secondary | ICD-10-CM | POA: Insufficient documentation

## 2021-07-29 LAB — SARS CORONAVIRUS 2 (TAT 6-24 HRS): SARS Coronavirus 2: NEGATIVE

## 2021-08-01 MED ORDER — CEFAZOLIN SODIUM-DEXTROSE 2-4 GM/100ML-% IV SOLN
2.0000 g | INTRAVENOUS | Status: AC
Start: 2021-08-02 — End: 2021-08-02
  Administered 2021-08-02: 2 g via INTRAVENOUS

## 2021-08-01 MED ORDER — CHLORHEXIDINE GLUCONATE 0.12 % MT SOLN: 15.0000 mL | Freq: Once | OROMUCOSAL | Status: AC

## 2021-08-01 MED ORDER — FAMOTIDINE 20 MG PO TABS: 20.0000 mg | ORAL_TABLET | Freq: Once | ORAL | Status: AC

## 2021-08-01 MED ORDER — TRANEXAMIC ACID-NACL 1000-0.7 MG/100ML-% IV SOLN
1000.0000 mg | INTRAVENOUS | Status: AC
  Administered 2021-08-02: 1000 mg via INTRAVENOUS

## 2021-08-01 MED ORDER — LACTATED RINGERS IV SOLN: INTRAVENOUS | Status: DC

## 2021-08-01 MED ORDER — ORAL CARE MOUTH RINSE: 15.0000 mL | Freq: Once | OROMUCOSAL | Status: AC

## 2021-08-02 ENCOUNTER — Inpatient Hospital Stay
Admission: RE | Admit: 2021-08-02 | Discharge: 2021-08-03 | DRG: 483 | Disposition: A | Discharge status: Home / Self Care | Payer: PPO | Attending: Orthopedic Surgery | Admitting: Orthopedic Surgery

## 2021-08-02 ENCOUNTER — Other Ambulatory Visit: Payer: Self-pay

## 2021-08-02 ENCOUNTER — Encounter: Payer: Self-pay | Admitting: Orthopedic Surgery

## 2021-08-02 ENCOUNTER — Inpatient Hospital Stay: Payer: PPO | Admitting: Urgent Care

## 2021-08-02 ENCOUNTER — Inpatient Hospital Stay: Payer: PPO

## 2021-08-02 ENCOUNTER — Ambulatory Visit: Payer: PPO

## 2021-08-02 ENCOUNTER — Encounter
Admission: RE | Disposition: A | Discharge status: Home / Self Care | Payer: Self-pay | Source: Home / Self Care | Attending: Orthopedic Surgery

## 2021-08-02 DIAGNOSIS — M25511 Pain in right shoulder: Secondary | ICD-10-CM | POA: Diagnosis present

## 2021-08-02 DIAGNOSIS — Z9889 Other specified postprocedural states: Secondary | ICD-10-CM | POA: Diagnosis not present

## 2021-08-02 DIAGNOSIS — M19011 Primary osteoarthritis, right shoulder: Secondary | ICD-10-CM | POA: Diagnosis present

## 2021-08-02 DIAGNOSIS — Z01818 Encounter for other preprocedural examination: Secondary | ICD-10-CM

## 2021-08-02 DIAGNOSIS — Z800 Family history of malignant neoplasm of digestive organs: Secondary | ICD-10-CM | POA: Diagnosis not present

## 2021-08-02 DIAGNOSIS — N400 Enlarged prostate without lower urinary tract symptoms: Secondary | ICD-10-CM | POA: Diagnosis present

## 2021-08-02 DIAGNOSIS — Z8249 Family history of ischemic heart disease and other diseases of the circulatory system: Secondary | ICD-10-CM | POA: Diagnosis not present

## 2021-08-02 DIAGNOSIS — K760 Fatty (change of) liver, not elsewhere classified: Secondary | ICD-10-CM | POA: Diagnosis present

## 2021-08-02 DIAGNOSIS — G8918 Other acute postprocedural pain: Secondary | ICD-10-CM | POA: Diagnosis not present

## 2021-08-02 DIAGNOSIS — Z96612 Presence of left artificial shoulder joint: Secondary | ICD-10-CM | POA: Diagnosis present

## 2021-08-02 DIAGNOSIS — Z96611 Presence of right artificial shoulder joint: Secondary | ICD-10-CM

## 2021-08-02 DIAGNOSIS — I10 Essential (primary) hypertension: Secondary | ICD-10-CM | POA: Diagnosis present

## 2021-08-02 DIAGNOSIS — Z85828 Personal history of other malignant neoplasm of skin: Secondary | ICD-10-CM | POA: Diagnosis not present

## 2021-08-02 DIAGNOSIS — R600 Localized edema: Secondary | ICD-10-CM | POA: Diagnosis not present

## 2021-08-02 DIAGNOSIS — I6523 Occlusion and stenosis of bilateral carotid arteries: Secondary | ICD-10-CM | POA: Diagnosis present

## 2021-08-02 DIAGNOSIS — G8929 Other chronic pain: Secondary | ICD-10-CM | POA: Diagnosis present

## 2021-08-02 DIAGNOSIS — Z471 Aftercare following joint replacement surgery: Secondary | ICD-10-CM | POA: Diagnosis not present

## 2021-08-02 DIAGNOSIS — I2510 Atherosclerotic heart disease of native coronary artery without angina pectoris: Secondary | ICD-10-CM | POA: Diagnosis present

## 2021-08-02 DIAGNOSIS — G20 Parkinson's disease: Secondary | ICD-10-CM | POA: Diagnosis present

## 2021-08-02 DIAGNOSIS — E785 Hyperlipidemia, unspecified: Secondary | ICD-10-CM | POA: Diagnosis present

## 2021-08-02 DIAGNOSIS — Z87891 Personal history of nicotine dependence: Secondary | ICD-10-CM | POA: Diagnosis not present

## 2021-08-02 HISTORY — DX: Presence of right artificial shoulder joint: Z96.611

## 2021-08-02 HISTORY — PX: TOTAL SHOULDER ARTHROPLASTY: SHX126

## 2021-08-02 HISTORY — DX: Fatty (change of) liver, not elsewhere classified: K76.0

## 2021-08-02 HISTORY — DX: Atherosclerosis of aorta: I70.0

## 2021-08-02 HISTORY — DX: Other ill-defined heart diseases: I51.89

## 2021-08-02 HISTORY — DX: Mild cognitive impairment of uncertain or unknown etiology: G31.84

## 2021-08-02 SURGERY — ARTHROPLASTY, SHOULDER, TOTAL
Anesthesia: Regional | Site: Shoulder | Laterality: Right

## 2021-08-02 MED ORDER — SERTRALINE HCL 50 MG PO TABS: 100.0000 mg | ORAL_TABLET | Freq: Every day | ORAL | Status: DC

## 2021-08-02 MED ORDER — DEXMEDETOMIDINE (PRECEDEX) IN NS 20 MCG/5ML (4 MCG/ML) IV SYRINGE
PREFILLED_SYRINGE | INTRAVENOUS | Status: DC | PRN
  Administered 2021-08-02: 4 ug via INTRAVENOUS

## 2021-08-02 MED ORDER — OXYCODONE HCL 5 MG/5ML PO SOLN: 5.0000 mg | Freq: Once | ORAL | Status: DC | PRN

## 2021-08-02 MED ORDER — FENTANYL CITRATE (PF) 100 MCG/2ML IJ SOLN
INTRAMUSCULAR | Status: DC | PRN
  Administered 2021-08-02: 100 ug via INTRAVENOUS

## 2021-08-02 MED ORDER — FENTANYL CITRATE (PF) 100 MCG/2ML IJ SOLN
INTRAMUSCULAR | Status: AC
  Administered 2021-08-02: 50 ug via INTRAVENOUS
  Filled 2021-08-02: qty 2

## 2021-08-02 MED ORDER — METOCLOPRAMIDE HCL 10 MG PO TABS: 5.0000 mg | ORAL_TABLET | Freq: Three times a day (TID) | ORAL | Status: DC | PRN

## 2021-08-02 MED ORDER — METOCLOPRAMIDE HCL 5 MG/ML IJ SOLN: 5.0000 mg | Freq: Three times a day (TID) | INTRAMUSCULAR | Status: DC | PRN

## 2021-08-02 MED ORDER — CEFAZOLIN SODIUM-DEXTROSE 2-4 GM/100ML-% IV SOLN
INTRAVENOUS | Status: AC
  Administered 2021-08-03: 2 g via INTRAVENOUS
  Filled 2021-08-02: qty 100

## 2021-08-02 MED ORDER — CHLORHEXIDINE GLUCONATE 0.12 % MT SOLN
OROMUCOSAL | Status: AC
  Administered 2021-08-02: 15 mL via OROMUCOSAL
  Filled 2021-08-02: qty 15

## 2021-08-02 MED ORDER — CARBIDOPA-LEVODOPA 25-100 MG PO TABS
2.0000 | ORAL_TABLET | Freq: Three times a day (TID) | ORAL | Status: DC
  Administered 2021-08-02 – 2021-08-03 (×2): 2 via ORAL
  Filled 2021-08-02 (×2): qty 2

## 2021-08-02 MED ORDER — SODIUM CHLORIDE 0.9 % IR SOLN
Status: DC | PRN
  Administered 2021-08-02: 1000 mL

## 2021-08-02 MED ORDER — HYDROCODONE-ACETAMINOPHEN 5-325 MG PO TABS: 1.0000 | ORAL_TABLET | ORAL | Status: DC | PRN

## 2021-08-02 MED ORDER — VITAMIN B-12 1000 MCG PO TABS
1000.0000 ug | ORAL_TABLET | Freq: Every day | ORAL | Status: DC
  Administered 2021-08-03: 1000 ug via ORAL
  Filled 2021-08-02: qty 1

## 2021-08-02 MED ORDER — MENTHOL 3 MG MT LOZG
1.0000 | LOZENGE | OROMUCOSAL | Status: DC | PRN
  Filled 2021-08-02: qty 9

## 2021-08-02 MED ORDER — 0.9 % SODIUM CHLORIDE (POUR BTL) OPTIME
TOPICAL | Status: DC | PRN
  Administered 2021-08-02: 1000 mL

## 2021-08-02 MED ORDER — SODIUM CHLORIDE (PF) 0.9 % IJ SOLN
INTRAMUSCULAR | Status: AC
  Filled 2021-08-02: qty 20

## 2021-08-02 MED ORDER — CARBIDOPA-LEVODOPA ER 50-200 MG PO TBCR
1.0000 | EXTENDED_RELEASE_TABLET | Freq: Every day | ORAL | Status: DC
  Administered 2021-08-02: 1 via ORAL
  Filled 2021-08-02 (×2): qty 1

## 2021-08-02 MED ORDER — ONDANSETRON HCL 4 MG/2ML IJ SOLN: 4.0000 mg | Freq: Four times a day (QID) | INTRAMUSCULAR | Status: DC | PRN

## 2021-08-02 MED ORDER — HYDROCODONE-ACETAMINOPHEN 7.5-325 MG PO TABS: 1.0000 | ORAL_TABLET | ORAL | Status: DC | PRN

## 2021-08-02 MED ORDER — GLYCOPYRROLATE 0.2 MG/ML IJ SOLN
INTRAMUSCULAR | Status: DC | PRN
  Administered 2021-08-02: .2 mg via INTRAVENOUS

## 2021-08-02 MED ORDER — KETOROLAC TROMETHAMINE 15 MG/ML IJ SOLN
7.5000 mg | Freq: Four times a day (QID) | INTRAMUSCULAR | Status: DC
  Administered 2021-08-02 – 2021-08-03 (×3): 7.5 mg via INTRAVENOUS
  Filled 2021-08-02 (×3): qty 1

## 2021-08-02 MED ORDER — PHENOL 1.4 % MT LIQD
1.0000 | OROMUCOSAL | Status: DC | PRN
  Filled 2021-08-02: qty 177

## 2021-08-02 MED ORDER — LACTATED RINGERS IV SOLN: INTRAVENOUS | Status: DC

## 2021-08-02 MED ORDER — ACETAMINOPHEN 325 MG PO TABS: 325.0000 mg | ORAL_TABLET | Freq: Four times a day (QID) | ORAL | Status: DC | PRN

## 2021-08-02 MED ORDER — TAMSULOSIN HCL 0.4 MG PO CAPS
0.4000 mg | ORAL_CAPSULE | Freq: Every day | ORAL | Status: DC
  Administered 2021-08-02: 0.4 mg via ORAL
  Filled 2021-08-02: qty 1

## 2021-08-02 MED ORDER — LIDOCAINE HCL (CARDIAC) PF 100 MG/5ML IV SOSY
PREFILLED_SYRINGE | INTRAVENOUS | Status: DC | PRN
  Administered 2021-08-02: 100 mg via INTRAVENOUS

## 2021-08-02 MED ORDER — TRANEXAMIC ACID-NACL 1000-0.7 MG/100ML-% IV SOLN
INTRAVENOUS | Status: AC
  Filled 2021-08-02: qty 100

## 2021-08-02 MED ORDER — ONDANSETRON HCL 4 MG PO TABS: 4.0000 mg | ORAL_TABLET | Freq: Four times a day (QID) | ORAL | Status: DC | PRN

## 2021-08-02 MED ORDER — SUGAMMADEX SODIUM 200 MG/2ML IV SOLN
INTRAVENOUS | Status: DC | PRN
  Administered 2021-08-02: 200 mg via INTRAVENOUS

## 2021-08-02 MED ORDER — FENTANYL CITRATE (PF) 100 MCG/2ML IJ SOLN: 25.0000 ug | INTRAMUSCULAR | Status: DC | PRN

## 2021-08-02 MED ORDER — MORPHINE SULFATE (PF) 2 MG/ML IV SOLN: 0.5000 mg | INTRAVENOUS | Status: DC | PRN

## 2021-08-02 MED ORDER — MIDAZOLAM HCL 2 MG/2ML IJ SOLN
INTRAMUSCULAR | Status: AC
  Administered 2021-08-02: 2 mg via INTRAVENOUS
  Filled 2021-08-02: qty 2

## 2021-08-02 MED ORDER — ROSUVASTATIN CALCIUM 10 MG PO TABS
10.0000 mg | ORAL_TABLET | Freq: Every day | ORAL | Status: DC
  Administered 2021-08-02: 10 mg via ORAL
  Filled 2021-08-02: qty 1

## 2021-08-02 MED ORDER — EPHEDRINE SULFATE 50 MG/ML IJ SOLN
INTRAMUSCULAR | Status: DC | PRN
  Administered 2021-08-02: 10 mg via INTRAVENOUS

## 2021-08-02 MED ORDER — SODIUM CHLORIDE (PF) 0.9 % IJ SOLN
INTRAMUSCULAR | Status: DC | PRN
  Administered 2021-08-02: 10 mL

## 2021-08-02 MED ORDER — DOCUSATE SODIUM 100 MG PO CAPS
100.0000 mg | ORAL_CAPSULE | Freq: Two times a day (BID) | ORAL | Status: DC
  Administered 2021-08-02: 100 mg via ORAL
  Filled 2021-08-02: qty 1

## 2021-08-02 MED ORDER — LIDOCAINE HCL (PF) 1 % IJ SOLN
INTRAMUSCULAR | Status: AC
  Filled 2021-08-02: qty 5

## 2021-08-02 MED ORDER — OXYCODONE HCL 5 MG PO TABS
5.0000 mg | ORAL_TABLET | Freq: Once | ORAL | Status: DC | PRN
Start: 2021-08-02 — End: 2021-08-02

## 2021-08-02 MED ORDER — ONDANSETRON HCL 4 MG/2ML IJ SOLN
INTRAMUSCULAR | Status: DC | PRN
  Administered 2021-08-02: 4 mg via INTRAVENOUS

## 2021-08-02 MED ORDER — FAMOTIDINE 20 MG PO TABS
ORAL_TABLET | ORAL | Status: AC
  Administered 2021-08-02: 20 mg via ORAL
  Filled 2021-08-02: qty 1

## 2021-08-02 MED ORDER — SUCCINYLCHOLINE CHLORIDE 200 MG/10ML IV SOSY
PREFILLED_SYRINGE | INTRAVENOUS | Status: DC | PRN
  Administered 2021-08-02: 100 mg via INTRAVENOUS

## 2021-08-02 MED ORDER — FENTANYL CITRATE (PF) 100 MCG/2ML IJ SOLN
INTRAMUSCULAR | Status: AC
  Filled 2021-08-02: qty 2

## 2021-08-02 MED ORDER — AZELASTINE HCL 0.1 % NA SOLN
2.0000 | Freq: Two times a day (BID) | NASAL | Status: DC
  Administered 2021-08-02: 2 via NASAL
  Filled 2021-08-02: qty 30

## 2021-08-02 MED ORDER — DEXAMETHASONE SODIUM PHOSPHATE 10 MG/ML IJ SOLN
INTRAMUSCULAR | Status: DC | PRN
  Administered 2021-08-02: 10 mg via INTRAVENOUS

## 2021-08-02 MED ORDER — SURGIRINSE WOUND IRRIGATION SYSTEM - OPTIME
TOPICAL | Status: DC | PRN
  Administered 2021-08-02: 450 mL via TOPICAL

## 2021-08-02 MED ORDER — CEFAZOLIN SODIUM-DEXTROSE 2-4 GM/100ML-% IV SOLN
2.0000 g | Freq: Four times a day (QID) | INTRAVENOUS | Status: AC
Start: 2021-08-02 — End: 2021-08-03
  Administered 2021-08-02 – 2021-08-03 (×2): 2 g via INTRAVENOUS
  Filled 2021-08-02 (×3): qty 100

## 2021-08-02 MED ORDER — SODIUM CHLORIDE 0.9 % IV SOLN
INTRAVENOUS | Status: DC | PRN
  Administered 2021-08-02: 20 ug/min via INTRAVENOUS

## 2021-08-02 MED ORDER — BUPIVACAINE-EPINEPHRINE (PF) 0.25% -1:200000 IJ SOLN
INTRAMUSCULAR | Status: DC | PRN
  Administered 2021-08-02: 25 mL

## 2021-08-02 MED ORDER — MIDAZOLAM HCL 2 MG/2ML IJ SOLN: 2.0000 mg | Freq: Once | INTRAMUSCULAR | Status: AC

## 2021-08-02 MED ORDER — ROCURONIUM BROMIDE 100 MG/10ML IV SOLN
INTRAVENOUS | Status: DC | PRN
  Administered 2021-08-02: 20 mg via INTRAVENOUS
  Administered 2021-08-02: 10 mg via INTRAVENOUS
  Administered 2021-08-02: 30 mg via INTRAVENOUS
  Administered 2021-08-02: 10 mg via INTRAVENOUS
  Administered 2021-08-02: 20 mg via INTRAVENOUS

## 2021-08-02 MED ORDER — FENTANYL CITRATE (PF) 100 MCG/2ML IJ SOLN: 100.0000 ug | Freq: Once | INTRAMUSCULAR | Status: AC

## 2021-08-02 MED ORDER — BUPIVACAINE LIPOSOME 1.3 % IJ SUSP
INTRAMUSCULAR | Status: AC
  Filled 2021-08-02: qty 20

## 2021-08-02 MED ORDER — PROPOFOL 10 MG/ML IV BOLUS
INTRAVENOUS | Status: DC | PRN
  Administered 2021-08-02: 150 mg via INTRAVENOUS

## 2021-08-02 MED ORDER — BUPIVACAINE HCL (PF) 0.5 % IJ SOLN
INTRAMUSCULAR | Status: AC
  Filled 2021-08-02: qty 10

## 2021-08-02 SURGICAL SUPPLY — 72 items
APL PRP STRL LF DISP 70% ISPRP (MISCELLANEOUS) ×2
BASEPLATE GLENOSPHERE 25 STD (Miscellaneous) ×1 IMPLANT
BLADE BOVIE TIP EXT 4 (BLADE) ×2 IMPLANT
BLADE SAW 90X25X1.19 OSCILLAT (BLADE) ×2 IMPLANT
BOWL CEMENT MIX W/ADAPTER (MISCELLANEOUS) ×1 IMPLANT
BRUSH SCRUB EZ  4% CHG (MISCELLANEOUS) ×2
BRUSH SCRUB EZ 4% CHG (MISCELLANEOUS) ×2 IMPLANT
BSPLAT GLND 15 PRFT LNG POST (Screw) ×1 IMPLANT
BSPLAT GLND STD 25 RVRS SHLDR (Miscellaneous) ×1 IMPLANT
CEMENT BONE 40GM (Cement) ×1 IMPLANT
CHLORAPREP W/TINT 26 (MISCELLANEOUS) ×4 IMPLANT
COVER BACK TABLE REUSABLE LG (DRAPES) ×2 IMPLANT
DRAPE 3/4 80X56 (DRAPES) ×4 IMPLANT
DRAPE IMP U-DRAPE 54X76 (DRAPES) ×2 IMPLANT
DRAPE INCISE IOBAN 66X60 STRL (DRAPES) ×4 IMPLANT
DRAPE U-SHAPE 47X51 STRL (DRAPES) ×2 IMPLANT
DRSG AQUACEL AG ADV 3.5X10 (GAUZE/BANDAGES/DRESSINGS) ×1 IMPLANT
ELECT BLADE 6.5 EXT (BLADE) ×1 IMPLANT
ELECT REM PT RETURN 9FT ADLT (ELECTROSURGICAL) ×2
ELECTRODE REM PT RTRN 9FT ADLT (ELECTROSURGICAL) ×1 IMPLANT
GAUZE SPONGE 4X4 12PLY STRL (GAUZE/BANDAGES/DRESSINGS) IMPLANT
GAUZE XEROFORM 1X8 LF (GAUZE/BANDAGES/DRESSINGS) ×2 IMPLANT
GLENOSPHERE STANDARD 39 (Joint) ×2 IMPLANT
GLENOSPHERE STD 39 (Joint) IMPLANT
GLOVE SURG ORTHO LTX SZ8 (GLOVE) ×2 IMPLANT
GLOVE SURG UNDER LTX SZ8 (GLOVE) ×2 IMPLANT
GOWN STRL REUS W/ TWL LRG LVL3 (GOWN DISPOSABLE) ×1 IMPLANT
GOWN STRL REUS W/ TWL XL LVL3 (GOWN DISPOSABLE) ×1 IMPLANT
GOWN STRL REUS W/TWL LRG LVL3 (GOWN DISPOSABLE) ×2
GOWN STRL REUS W/TWL XL LVL3 (GOWN DISPOSABLE) ×2
GUIDEWIRE GLENOID 2.5X220 (WIRE) ×1 IMPLANT
HOOD PEEL AWAY FLYTE STAYCOOL (MISCELLANEOUS) ×6 IMPLANT
IMPL REVERSE SHOULDER 0X3.5 (Shoulder) IMPLANT
IMPLANT REVERSE SHOULDER 0X3.5 (Shoulder) ×4 IMPLANT
INSERT REV KIT SHOULDER 6X39 (Screw) ×2 IMPLANT
IRRIGATION SURGIPHOR STRL (IV SOLUTION) ×1 IMPLANT
IV NS 1000ML (IV SOLUTION) ×4
IV NS 1000ML BAXH (IV SOLUTION) ×1 IMPLANT
KIT STABILIZATION SHOULDER (MISCELLANEOUS) ×2 IMPLANT
KIT TURNOVER KIT A (KITS) ×2 IMPLANT
MANIFOLD NEPTUNE II (INSTRUMENTS) ×2 IMPLANT
MASK FACE SPIDER DISP (MASK) ×2 IMPLANT
MAT ABSORB  FLUID 56X50 GRAY (MISCELLANEOUS) ×2
MAT ABSORB FLUID 56X50 GRAY (MISCELLANEOUS) ×1 IMPLANT
NDL REVERSE CUT 1/2 CRC (NEEDLE) ×1 IMPLANT
NDL SAFETY ECLIPSE 18X1.5 (NEEDLE) IMPLANT
NEEDLE HYPO 18GX1.5 SHARP (NEEDLE)
NEEDLE HYPO 22GX1.5 SAFETY (NEEDLE) ×1 IMPLANT
NEEDLE REVERSE CUT 1/2 CRC (NEEDLE) ×2 IMPLANT
NS IRRIG 1000ML POUR BTL (IV SOLUTION) ×2 IMPLANT
PACK ARTHROSCOPY SHOULDER (MISCELLANEOUS) ×2 IMPLANT
PAD ARMBOARD 7.5X6 YLW CONV (MISCELLANEOUS) ×5 IMPLANT
PULSAVAC PLUS IRRIG FAN TIP (DISPOSABLE) ×2
SCREW 5.5X26 (Screw) ×2 IMPLANT
SCREW BONE SMALL REVERSED 15 (Screw) ×1 IMPLANT
SCREW PERIPHERAL 30 (Screw) ×1 IMPLANT
SLING ARM LRG DEEP (SOFTGOODS) ×1 IMPLANT
SLING ULTRA II LG (MISCELLANEOUS) ×1 IMPLANT
SPONGE T-LAP 18X18 ~~LOC~~+RFID (SPONGE) ×5 IMPLANT
STAPLER SKIN PROX 35W (STAPLE) ×2 IMPLANT
STEM HUMERAL AEQUALIS 5BX82 (Stem) ×2 IMPLANT
STRAP SAFETY 5IN WIDE (MISCELLANEOUS) ×3 IMPLANT
SUT TICRON 2-0 30IN 311381 (SUTURE) IMPLANT
SUT VIC AB 0 CT2 27 (SUTURE) ×2 IMPLANT
SUT VIC AB 2-0 CT1 18 (SUTURE) ×2 IMPLANT
SUT VIC AB PLUS 45CM 1-MO-4 (SUTURE) IMPLANT
SYR 10ML LL (SYRINGE) ×2 IMPLANT
SYR TOOMEY IRRIG 70ML (MISCELLANEOUS) ×2
SYRINGE TOOMEY IRRIG 70ML (MISCELLANEOUS) ×1 IMPLANT
TAPE SUT 30 1/2 CRC GREEN (SUTURE) ×4 IMPLANT
TIP FAN IRRIG PULSAVAC PLUS (DISPOSABLE) ×1 IMPLANT
WATER STERILE IRR 1000ML POUR (IV SOLUTION) ×4 IMPLANT

## 2021-08-02 NOTE — Transfer of Care (Signed)
Immediate Anesthesia Transfer of Care Note  Patient: Stephen Stein  Procedure(s) Performed: TOTAL SHOULDER ARTHROPLASTY (Right: Shoulder)  Patient Location: PACU  Anesthesia Type:General  Level of Consciousness: drowsy  Airway & Oxygen Therapy: Patient Spontanous Breathing and Patient connected to face mask oxygen  Post-op Assessment: Report given to RN and Post -op Vital signs reviewed and stable  Post vital signs: Reviewed and stable  Last Vitals:  Vitals Value Taken Time  BP 101/62 08/02/21 1507  Temp    Pulse 64 08/02/21 1510  Resp 19 08/02/21 1510  SpO2 98 % 08/02/21 1510  Vitals shown include unvalidated device data.  Last Pain:  Vitals:   08/02/21 1037  TempSrc: Temporal  PainSc: 0-No pain         Complications: No notable events documented.

## 2021-08-02 NOTE — Anesthesia Procedure Notes (Signed)
Anesthesia Regional Block: Interscalene brachial plexus block   Pre-Anesthetic Checklist: , timeout performed,  Correct Patient, Correct Site, Correct Laterality,  Correct Procedure, Correct Position, site marked,  Risks and benefits discussed,  Surgical consent,  Pre-op evaluation,  At surgeon's request and post-op pain management  Laterality: Right  Prep: chloraprep       Needles:  Injection technique: Single-shot  Needle Type: Stimiplex     Needle Length: 10cm  Needle Gauge: 20     Additional Needles:   Procedures:, nerve stimulator,,, ultrasound used (permanent image in chart),,    Narrative:  Start time: 08/02/2021 11:45 AM End time: 08/02/2021 11:48 AM  Performed by: Personally  Anesthesiologist: Lennox Pippins, MD  Additional Notes: Exparel 10 cc plus 10 cc of 0.5% bupivacaine injected

## 2021-08-02 NOTE — Plan of Care (Signed)

## 2021-08-02 NOTE — Anesthesia Procedure Notes (Signed)
Procedure Name: Intubation Date/Time: 08/02/2021 12:28 PM Performed by: Lily Peer, Katherinne Mofield, CRNA Pre-anesthesia Checklist: Patient identified, Emergency Drugs available, Suction available and Patient being monitored Patient Re-evaluated:Patient Re-evaluated prior to induction Oxygen Delivery Method: Circle system utilized Preoxygenation: Pre-oxygenation with 100% oxygen Induction Type: IV induction Ventilation: Mask ventilation without difficulty Laryngoscope Size: McGraph and 4 Grade View: Grade I Tube type: Oral Tube size: 7.5 mm Number of attempts: 1 Airway Equipment and Method: Stylet and Oral airway Placement Confirmation: ETT inserted through vocal cords under direct vision, positive ETCO2 and breath sounds checked- equal and bilateral Secured at: 22 cm Tube secured with: Tape Dental Injury: Teeth and Oropharynx as per pre-operative assessment

## 2021-08-02 NOTE — Anesthesia Preprocedure Evaluation (Addendum)
Anesthesia Evaluation  Patient identified by MRN, date of birth, ID band Patient awake    Reviewed: Allergy & Precautions, NPO status   History of Anesthesia Complications Negative for: history of anesthetic complications  Airway Mallampati: II  TM Distance: >3 FB Neck ROM: Full    Dental no notable dental hx. (+) Teeth Intact   Pulmonary former smoker,    Pulmonary exam normal        Cardiovascular Exercise Tolerance: Good hypertension, Pt. on medications + CAD  Normal cardiovascular examI     Neuro/Psych    GI/Hepatic   Endo/Other    Renal/GU      Musculoskeletal   Abdominal Normal abdominal exam  (+)   Peds  Hematology   Anesthesia Other Findings   Reproductive/Obstetrics                            Anesthesia Physical Anesthesia Plan  ASA: 3  Anesthesia Plan: General and Regional   Post-op Pain Management:  Regional for Post-op pain   Induction: Intravenous  PONV Risk Score and Plan: Ondansetron  Airway Management Planned: Oral ETT  Additional Equipment:   Intra-op Plan:   Post-operative Plan: Extubation in OR  Informed Consent: I have reviewed the patients History and Physical, chart, labs and discussed the procedure including the risks, benefits and alternatives for the proposed anesthesia with the patient or authorized representative who has indicated his/her understanding and acceptance.       Plan Discussed with: CRNA  Anesthesia Plan Comments:         Anesthesia Quick Evaluation

## 2021-08-02 NOTE — Op Note (Signed)
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  Surgeon:   Kurtis Bushman, MD  Assistant:   Carlynn Spry, PA-C  Anesthesia:   General endotracheal with an interscalene block placed preoperatively by the anesthesiologist.  Findings:   As above.  Complications:   None  EBL:  200 cc  Drains:   None  Closure:   Staples  Implants:   Tournier glenoid base plate 25 mm, Press-Fit long post, Glenoid Sphere 39 mm, Reversed Tray, Reversed insert +6, Humeral stem 5B  Brief Clinical Note:   The patient has failed multiple conservative treatments for painful rotator tear arthropathy, including injections, medications and therapy. The patient presents at this time for a reverse right total shoulder arthroplasty.  Procedure:   The patient underwent placement of an interscalene block by the anesthesiologist in the preoperative holding area before being brought into the operating room and lain in the supine position. The patient then underwent general endotracheal intubation and anesthesia before a Foley catheter was inserted and the patient repositioned in the beach chair position using the beach chair positioner. The right shoulder and upper extremity were prepped with ChloraPrep solution before being draped sterilely. Preoperative antibiotics were administered. A standard anterior approach to the shoulder was made through an approximately 4-5 inch incision. The incision was carried down through the subcutaneous tissues to expose the deltopectoral fascia. The interval between the deltoid and pectoralis muscles was identified and this plane developed, retracting the cephalic vein laterally with the deltoid muscle. The conjoined tendon was identified. Its lateral margin was dissected and a self-retaining retractor inserted. The "three sisters" were  identified and cauterized. Bursal tissues were removed to improve visualization. The subscapularis tendon was released from its attachment to the lesser tuberosity 1 cm proximal to its insertion and tagging sutures placed. The inferior capsule was released with care after identifying and protecting the axillary nerve. The proximal humeral cut was made at approximately 20 of retroversion using the extra-medullary guide.   Attention was redirected to the glenoid. The labrum was debrided circumferentially before the center of the glenoid was marked with electrocautery. The guidewire was drilled into the glenoid neck using the appropriate guide. After verifying its position, it was overreamed with the mini-baseplate reamer to create a flat surface. The permanent mini-baseplate was impacted into place. It was stabilized with 2 locking peripheral screws. Locking screws were placed superiorly and inferiorly. The permanent 39 mm glenosphere was then impacted into place and the central screw tightened. The locking mechanism verified using manual distraction.  Attention was directed to the humeral side. The humeral canal was sounded sequentially beginning with the end-cutting reamer. This provided excellent circumferential chatter. The canal was broached. This was left in place and a trial reduction performed using the standard trial humeral platform. The arm demonstrated excellent range of motion as the hand could be brought across the chest to the opposite shoulder and brought to the top of the patient's head and to the patient's ear. The shoulder appeared stable throughout this range of motion. The joint was dislocated and the trial components removed. The final stem and tray was impacted into place with care taken to maintain the appropriate version. The final insert was then impacted in to position. The shoulder was relocated using two finger pressure and again placed through a range of motion with  the findings as  described above.  The wound was copiously irrigated with betadine and saline solution. The deltopectoral interval was closed using #0 Vicryl interrupted sutures before the subcutaneous tissues were closed using 2-0 Vicryl interrupted sutures. The skin was closed using staples.  A sterile occlusive dressing was applied to the wound before the arm was placed into a shoulder sling. The patient was then transferred back to a hospital bed before being awakened, extubated, and returned to the recovery room in satisfactory condition after tolerating the procedure well.  Kurtis Bushman, MD

## 2021-08-02 NOTE — H&P (Signed)
The patient has been re-examined, and the chart reviewed, and there have been no interval changes to the documented history and physical.  Plan a right total shoulder today.  Anesthesia is consulted regarding a peripheral nerve block for post-operative pain.  The risks, benefits, and alternatives have been discussed at length, and the patient is willing to proceed.

## 2021-08-03 ENCOUNTER — Encounter: Payer: Self-pay | Admitting: Orthopedic Surgery

## 2021-08-03 DIAGNOSIS — Z8249 Family history of ischemic heart disease and other diseases of the circulatory system: Secondary | ICD-10-CM | POA: Diagnosis not present

## 2021-08-03 DIAGNOSIS — I2510 Atherosclerotic heart disease of native coronary artery without angina pectoris: Secondary | ICD-10-CM | POA: Diagnosis present

## 2021-08-03 DIAGNOSIS — N400 Enlarged prostate without lower urinary tract symptoms: Secondary | ICD-10-CM | POA: Diagnosis present

## 2021-08-03 DIAGNOSIS — M25511 Pain in right shoulder: Secondary | ICD-10-CM | POA: Diagnosis present

## 2021-08-03 DIAGNOSIS — Z87891 Personal history of nicotine dependence: Secondary | ICD-10-CM | POA: Diagnosis not present

## 2021-08-03 DIAGNOSIS — G8929 Other chronic pain: Secondary | ICD-10-CM | POA: Diagnosis present

## 2021-08-03 DIAGNOSIS — K760 Fatty (change of) liver, not elsewhere classified: Secondary | ICD-10-CM | POA: Diagnosis present

## 2021-08-03 DIAGNOSIS — E785 Hyperlipidemia, unspecified: Secondary | ICD-10-CM | POA: Diagnosis present

## 2021-08-03 DIAGNOSIS — I10 Essential (primary) hypertension: Secondary | ICD-10-CM | POA: Diagnosis present

## 2021-08-03 DIAGNOSIS — Z800 Family history of malignant neoplasm of digestive organs: Secondary | ICD-10-CM | POA: Diagnosis not present

## 2021-08-03 DIAGNOSIS — Z96612 Presence of left artificial shoulder joint: Secondary | ICD-10-CM | POA: Diagnosis present

## 2021-08-03 DIAGNOSIS — Z85828 Personal history of other malignant neoplasm of skin: Secondary | ICD-10-CM | POA: Diagnosis not present

## 2021-08-03 DIAGNOSIS — M19011 Primary osteoarthritis, right shoulder: Secondary | ICD-10-CM | POA: Diagnosis present

## 2021-08-03 DIAGNOSIS — G20 Parkinson's disease: Secondary | ICD-10-CM | POA: Diagnosis present

## 2021-08-03 DIAGNOSIS — I6523 Occlusion and stenosis of bilateral carotid arteries: Secondary | ICD-10-CM | POA: Diagnosis present

## 2021-08-03 MED ORDER — ASPIRIN EC 81 MG PO TBEC: 81.0000 mg | DELAYED_RELEASE_TABLET | Freq: Every day | ORAL | 11 refills | Status: DC

## 2021-08-03 MED ORDER — HYDROCODONE-ACETAMINOPHEN 5-325 MG PO TABS: 1.0000 | ORAL_TABLET | ORAL | 0 refills | Status: DC | PRN

## 2021-08-03 MED ORDER — DOCUSATE SODIUM 100 MG PO CAPS: 100.0000 mg | ORAL_CAPSULE | Freq: Two times a day (BID) | ORAL | 0 refills | Status: DC

## 2021-08-03 MED ORDER — METHOCARBAMOL 500 MG PO TABS: 500.0000 mg | ORAL_TABLET | Freq: Three times a day (TID) | ORAL | 1 refills | Status: DC | PRN

## 2021-08-03 NOTE — Anesthesia Postprocedure Evaluation (Signed)
Anesthesia Post Note  Patient: Stephen Stein  Procedure(s) Performed: TOTAL SHOULDER ARTHROPLASTY (Right: Shoulder)  Patient location during evaluation: PACU Anesthesia Type: Regional Level of consciousness: awake and alert Pain management: pain level controlled Vital Signs Assessment: post-procedure vital signs reviewed and stable Respiratory status: spontaneous breathing, nonlabored ventilation, respiratory function stable and patient connected to nasal cannula oxygen Cardiovascular status: blood pressure returned to baseline and stable Postop Assessment: no apparent nausea or vomiting Anesthetic complications: no   No notable events documented.   Last Vitals:  Vitals:   08/03/21 0015 08/03/21 0336  BP: 113/61 113/65  Pulse: 66 (!) 57  Resp: 18 17  Temp: 37.2 C 36.7 C  SpO2: 94% 94%    Last Pain:  Vitals:   08/03/21 0015  TempSrc: Oral  PainSc:                  Lennox Pippins

## 2021-08-03 NOTE — Plan of Care (Signed)
Plan of Care Ongoing Problem: Education: Goal: Knowledge of the prescribed therapeutic regimen will improve Outcome: Progressing   Problem: Activity: Goal: Ability to tolerate increased activity will improve Outcome: Progressing   Problem: Pain Managment: Goal: General experience of comfort will improve 08/03/2021 0521 by Leinani Lisbon, Crist Fat, RN Outcome: Progressing 08/03/2021 0521 by Shantaya Bluestone, Crist Fat, RN Outcome: Progressing   Patient sleeping between care. Aox4. No complaints of pain or discomfort overnight. No new changes in assessment. Call bell within reach. Bed alarm on.

## 2021-08-03 NOTE — Discharge Summary (Signed)
Physician Discharge Summary  Patient ID: Stephen Stein MRN: [id number] DOB/AGE: [birth year] 80 y.o.  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 08/2018 s/p lap chole.   BPH (benign prostatic hyperplasia)    Carotid atherosclerosis, bilateral    a. 01/2019 Carotid U/S: <50% bilat ICA stenosis.   Coronary artery calcification seen on CT scan    a. 11/2015 Cardiac CT: Cor Ca2+ = 892 (80th %'ile). Diffuse Ca2+ in all 3 major coronary arteries; b. 02/2016 MV: HTN response to exercise. Abnl ecg w/ horizontal inflat ST depression, but no ischemia/infarct on imaging; c. 09/2018 MV: EF 64%, no ischemia. Very sm region of mild distal antsept defect, likely 2/2 lbbb.   Depression    Grade I diastolic dysfunction    Hepatic steatosis    Hx of basal cell carcinoma 08/01/2019   R chest parasternal   Hyperlipidemia    Hypertension    Irregular heartbeat/Palpitations    a. Has been taking Atenolol for 20 years   LBBB (left bundle branch block)    Left ventricular hypokinesis    a. 09/2018 Echo: EF 45-50%, mod LVH, diff HK. Gr1 DD. Triv AI. Mildly dil Ao root. Mildly reduced RV fxn.   Lightheadedness    Mild cognitive impairment    NSVT (nonsustained ventricular tachycardia) ([place])    a. 11/2018 Zio monitor - 13 beats of NSVT--asymptomatic.   Orthostatic hypotension    a. 11/2018 pressures improved w/ midodrine/florinef.   Parkinson's disease ([place])    Sinus bradycardia    a. 11/2018 Zio monitor: Avg HR 57 (min 28). Intermittent Mobitz I.    Surgeries: Procedure(s): TOTAL SHOULDER  ARTHROPLASTY on 08/02/2021   Consultants (if any):   Discharged Condition: Improved  Hospital Course: Stephen Stein is an 80 y.o. 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    He was given perioperative antibiotics:  Anti-infectives (From admission, onward)    Start     Dose/Rate Route Frequency Ordered Stop   08/02/21 1900  ceFAZolin (ANCEF) IVPB 2g/100 mL premix        2 g 200 mL/hr over 30 Minutes Intravenous Every 6 hours 08/02/21 1624 08/03/21 0712   08/02/21 1054  ceFAZolin (ANCEF) 2-4 GM/100ML-% IVPB       Note to Pharmacy: Sylvester Harder   : cabinet override      08/02/21 1054 08/03/21 0051   08/02/21 0600  ceFAZolin (ANCEF) IVPB 2g/100 mL premix        2 g 200 mL/hr over 30 Minutes Intravenous On call to O.R. 08/01/21 2308 08/02/21 1246     .  He was given sequential compression devices, early ambulation, and aspirin for DVT prophylaxis.  He benefited maximally from the hospital stay and there were no complications.    Recent vital signs:  Vitals:   08/03/21 0336 08/03/21 0751  BP: 113/65 (!) 116/54  Pulse: (!) 57 (!) 53  Resp: 17 16  Temp: 98.1 F (36.7  C) 98.9 F (37.2 C)  SpO2: 94% 94%    Recent laboratory studies:  Lab Results  Component Value Date   HGB 14.1 07/27/2021   HGB 13.6 04/20/2021   HGB 13.8 10/20/2020   Lab Results  Component Value Date   WBC 7.7 07/27/2021   PLT 224 07/27/2021   Lab Results  Component Value Date   INR 1.1 07/27/2021   Lab Results  Component Value Date   NA 138 07/27/2021   K 3.9 07/27/2021   CL 106 07/27/2021   CO2 27 07/27/2021   BUN 18 07/27/2021   CREATININE 0.99 07/27/2021   GLUCOSE 109 (H) 07/27/2021    Discharge Medications:   Allergies as of 08/03/2021   No Known Allergies      Medication List     TAKE these medications     acetaminophen 500 MG tablet Commonly known as: TYLENOL Take 1,000 mg by mouth every 6 (six) hours as needed for moderate pain.   aspirin EC 81 MG tablet Take 1 tablet (81 mg total) by mouth daily. Swallow whole.   azelastine 0.1 % nasal spray Commonly known as: ASTELIN Place 2 sprays into both nostrils 2 (two) times daily.   carbidopa-levodopa 25-100 MG tablet Commonly known as: SINEMET IR Take 2 tablets by mouth 3 (three) times daily.   carbidopa-levodopa 50-200 MG tablet Commonly known as: SINEMET CR Take 1 tablet by mouth at bedtime.   docusate sodium 100 MG capsule Commonly known as: COLACE Take 1 capsule (100 mg total) by mouth 2 (two) times daily.   glucosamine-chondroitin 500-400 MG tablet Take 1 tablet by mouth daily.   HYDROcodone-acetaminophen 5-325 MG tablet Commonly known as: NORCO/VICODIN Take 1 tablet by mouth every 4 (four) hours as needed for moderate pain (pain score 4-6).   meloxicam 15 MG tablet Commonly known as: MOBIC Take 15 mg by mouth daily.   methocarbamol 500 MG tablet Commonly known as: Robaxin Take 1 tablet (500 mg total) by mouth every 8 (eight) hours as needed for muscle spasms.   multivitamin with minerals Tabs tablet Take 1 tablet by mouth daily.   sertraline 100 MG tablet Commonly known as: ZOLOFT Take 100 mg by mouth daily.   tamsulosin 0.4 MG Caps capsule Commonly known as: FLOMAX Take 1 capsule (0.4 mg total) by mouth at bedtime.   vitamin B-12 1000 MCG tablet Commonly known as: CYANOCOBALAMIN Take 1,000 mcg by mouth daily.   VITAMIN D3 PO Take 1 tablet by mouth daily.       ASK your doctor about these medications    rosuvastatin 10 MG tablet Commonly known as: CRESTOR TAKE 1 TABLET BY MOUTH EVERYDAY AT BEDTIME        Diagnostic Studies: CT SHOULDER RIGHT WO CONTRAST  Result Date: 07/23/2021 CLINICAL DATA:  Chronic right shoulder pain. Preoperative planning examination. EXAM: CT OF THE UPPER RIGHT EXTREMITY  WITHOUT CONTRAST TECHNIQUE: Multidetector CT imaging of the upper right extremity was performed according to the standard protocol. COMPARISON:  None. FINDINGS: Bones/Joint/Cartilage The patient has severe glenohumeral osteoarthritis with bone-on-bone joint space narrowing, subchondral sclerosis and a large osteophyte off the humeral head. There is a little to no subchondral cyst formation. Moderate acromioclavicular osteoarthritis is also seen. The acromion is type 2. No lytic or sclerotic lesion. No acute abnormality. Ligaments Suboptimally assessed by CT. Muscles and Tendons As visualized by CT scan, the rotator cuff appears grossly intact. Soft tissues Soft tissues demonstrate no acute or focal abnormality. Calcified mediastinal and right  hilar lymph nodes consistent with old granulomatous disease are noted. Imaged lung parenchyma is clear. IMPRESSION: Advanced glenohumeral osteoarthritis as described. Moderate acromioclavicular osteoarthritis. Electronically Signed   By: Inge Rise M.D.   On: 07/23/2021 21:19   DG Shoulder Right Port  Result Date: 08/02/2021 CLINICAL DATA:  Postop right shoulder. EXAM: PORTABLE RIGHT SHOULDER COMPARISON:  Preoperative CT 07/22/2021 FINDINGS: Reverse right shoulder arthroplasty in expected alignment. No periprosthetic lucency or fracture. Recent postsurgical change includes air and edema in the soft tissues. Lateral skin staples in place. IMPRESSION: Reverse right shoulder arthroplasty without immediate postoperative complication. Electronically Signed   By: Keith Rake M.D.   On: 08/02/2021 15:37   [place] OR NERVE BLOCK-IMAGE ONLY [facility])  Result Date: 08/02/2021 There is no interpretation for this exam.  This order is for images obtained during a surgical procedure.  Please See "Surgeries" Tab for more information regarding the procedure.    Disposition:        Signed: Carlynn Spry ,PA-C 08/03/2021, 8:09 AM

## 2021-08-03 NOTE — Progress Notes (Signed)
  Subjective:  Patient reports pain as mild.    Objective:   VITALS:   Vitals:   08/02/21 1936 08/03/21 0015 08/03/21 0336 08/03/21 0751  BP: 126/74 113/61 113/65 (!) 116/54  Pulse: 70 66 (!) 57 (!) 53  Resp: '17 18 17 16  '$ Temp: 98.1 F (36.7 C) 98.9 F (37.2 C) 98.1 F (36.7 C) 98.9 F (37.2 C)  TempSrc:  Oral    SpO2: 94% 94% 94% 94%  Weight:      Height:        PHYSICAL EXAM:  Neurologically intact ABD soft Neurovascular intact Sensation intact distally Intact pulses distally Dorsiflexion/Plantar flexion intact Incision: dressing C/D/I and dressing changed today No cellulitis present Compartment soft  LABS  No results found for this or any previous visit (from the past 24 hour(s)).  DG Shoulder Right Port  Result Date: 08/02/2021 CLINICAL DATA:  Postop right shoulder. EXAM: PORTABLE RIGHT SHOULDER COMPARISON:  Preoperative CT 07/22/2021 FINDINGS: Reverse right shoulder arthroplasty in expected alignment. No periprosthetic lucency or fracture. Recent postsurgical change includes air and edema in the soft tissues. Lateral skin staples in place. IMPRESSION: Reverse right shoulder arthroplasty without immediate postoperative complication. Electronically Signed   By: Keith Rake M.D.   On: 08/02/2021 15:37   [place] OR NERVE BLOCK-IMAGE ONLY [facility])  Result Date: 08/02/2021 There is no interpretation for this exam.  This order is for images obtained during a surgical procedure.  Please See "Surgeries" Tab for more information regarding the procedure.    Assessment/Plan: 1 Day Post-Op   Active Problems:   S/P reverse total shoulder arthroplasty, right   Advance diet May Discharge home Follow up in 2 weeks for staple removal Call to confirm appointment [place] , PA-C 08/03/2021, 8:01 AM

## 2021-08-03 NOTE — Evaluation (Signed)
Physical Therapy Evaluation Patient Details Name: Stephen Stein MRN: [id number] DOB: 09-Apr-1941 Today's Date: 08/03/2021   History of Present Illness  Pt is a 80 y.o. male s/p R reverse TSA with R interscalene brachial plexus block 08/02/21.  PMH includes Parkinson's, h/o CVA, L TSA 09/11/19, htn, CAD.  Clinical Impression  Prior to hospital admission, pt was independent with ambulation; lives with his wife on main floor of home with 4 STE B railings.  Currently pt is modified independent with bed mobility; SBA with transfers; SBA with ambulation 300 feet (no AD); and SBA navigating 4 steps with use of railing.  No loss of balance during sessions activities (pt reports having chronic mild dizziness at baseline).  No pain during session (pt had R interscalene brachial plexus block).  Pt appearing impulsive intermittently during session (pt and pt's wife report that is normal for pt and she has to remind pt to be patient and slow down); pt educated on safety and fall prevention and to take his time with activities for safety--pt verbalizing understanding.  Reviewed R UE precautions: pt and pt's wife verbalizing understanding.  Pt would benefit from skilled PT to address noted impairments and functional limitations (see below for any additional details).  Upon hospital discharge, pt would benefit from OP PT (pt reports having OP PT set up next week at [facility]).    Follow Up Recommendations Outpatient PT (pt reports having OP PT set up next week at [facility])    Equipment Recommendations  None recommended by PT    Recommendations for Other Services OT consult     Precautions / Restrictions Precautions Precautions: Shoulder Type of Shoulder Precautions: NWB"ing sling at all time except for ADL/exercise.  AROM elbow, wrist, and hand.  No P/AROM shoulder Shoulder Interventions: Shoulder sling/immobilizer Precaution Booklet Issued: Yes (comment) Required Braces or Orthoses:  Sling Restrictions Weight Bearing Restrictions: Yes RUE Weight Bearing: Non weight bearing      Mobility  Bed Mobility Overal bed mobility: Modified Independent             General bed mobility comments: Supine to/from sit with mild increased effort and initial cueing for R shoulder safety/precautions    Transfers Overall transfer level: Needs assistance Equipment used: None Transfers: Sit to/from [facility] Sit to Stand: Supervision Stand pivot transfers: Supervision       General transfer comment: vc's for taking his time with transfers  Ambulation/Gait Ambulation/Gait assistance: Supervision Gait Distance (Feet): 300 Feet Assistive device: None Gait Pattern/deviations: Step-through pattern Gait velocity: mildly decreased   General Gait Details: mild decreased B LE step length and foot clearance; no loss of balance noted  Stairs Stairs: Yes Stairs assistance: Supervision Stair Management: One rail Left;Alternating pattern;Forwards Number of Stairs: 4 General stair comments: pt steady and safe navigating stairs  Wheelchair Mobility    Modified Rankin (Stroke Patients Only)       Balance Overall balance assessment: Needs assistance Sitting-balance support: No upper extremity supported Sitting balance-Leahy Scale: Normal Sitting balance - Comments: steady sitting reaching outside BOS with L UE   Standing balance support: No upper extremity supported;During functional activity Standing balance-Leahy Scale: Good Standing balance comment: no loss of balance with ambulation; mild altered stepping patterns with walking with looking R/L/up/down but no loss of balance noted                             Pertinent Vitals/Pain Pain Assessment: No/denies  pain Vitals (HR 92-96 bpm and O2 92% or greater on room air) stable and WFL throughout treatment session.    Home Living Family/patient expects to be discharged to:: Private  residence Living Arrangements: Spouse/significant other Available Help at Discharge: Family;Available 24 hours/day Type of Home: House Home Access: Stairs to enter Entrance Stairs-Rails: Psychiatric nurse of Steps: 4 Home Layout: Multi-level Home Equipment: Cane - single point;Shower seat Additional Comments: Pt reports not using SPC    Prior Function Level of Independence: Independent         Comments: H/o falls (last one a few weeks ago).     Hand Dominance   Dominant Hand: Right    Extremity/Trunk Assessment   Upper Extremity Assessment Upper Extremity Assessment: Defer to OT evaluation RUE Deficits / Details: Per OT eval "No shoulder A/PROM; Pt with 1/4 full AROM R elbow, ROM in R wrist and hand WFL; PROM elbow, wrist, hand WFL" RUE Sensation: decreased light touch (pt with R interscalene brachial plexus block) RUE Coordination:  (deferred)    Lower Extremity Assessment Lower Extremity Assessment: Overall WFL for tasks assessed    Cervical / Trunk Assessment Cervical / Trunk Assessment: Normal  Communication   Communication: No difficulties  Cognition Arousal/Alertness: Awake/alert Behavior During Therapy: WFL for tasks assessed/performed (Pt impulsive at times) Overall Cognitive Status: Within Functional Limits for tasks assessed                                 General Comments: A&O x4      General Comments General comments (skin integrity, edema, etc.): R UE in sling.  Nursing cleared pt for participation in physical therapy.  Pt agreeable to PT session.  Pt's wife present during session.    Exercises     Assessment/Plan    PT Assessment Patient needs continued PT services  PT Problem List Decreased strength;Decreased range of motion;Impaired sensation       PT Treatment Interventions DME instruction;Gait training;Stair training;Functional mobility training;Therapeutic activities;Therapeutic exercise;Balance  training;Patient/family education    PT Goals (Current goals can be found in the Care Plan section)  Acute Rehab PT Goals Patient Stated Goal: to go home PT Goal Formulation: With patient Time For Goal Achievement: 08/17/21 Potential to Achieve Goals: Good    Frequency BID   Barriers to discharge        Co-evaluation               AM-PAC PT "6 Clicks" Mobility  Outcome Measure Help needed turning from your back to your side while in a flat bed without using bedrails?: None Help needed moving from lying on your back to sitting on the side of a flat bed without using bedrails?: None Help needed moving to and from a bed to a chair (including a wheelchair)?: None Help needed standing up from a chair using your arms (e.g., wheelchair or bedside chair)?: None Help needed to walk in hospital room?: A Little Help needed climbing 3-5 steps with a railing? : A Little 6 Click Score: 22    End of Session Equipment Utilized During Treatment: Gait belt;Other (comment) (R UE sling) Activity Tolerance: Patient tolerated treatment well Patient left: in chair;with call bell/phone within reach;with family/visitor present;Other (comment) (nurse reports no need for chair alarm) Nurse Communication: Mobility status;Precautions;Weight bearing status PT Visit Diagnosis: Other abnormalities of gait and mobility (R26.89);Pain Pain - Right/Left: Right Pain - part of body: Shoulder  Time: [id number] PT Time Calculation (min) (ACUTE ONLY): 18 min   Charges:   PT Evaluation $PT Eval Low Complexity: 1 Low PT Treatments $Therapeutic Activity: 8-22 mins       Leitha Bleak, PT 08/03/21, 10:40 AM

## 2021-08-03 NOTE — Evaluation (Signed)
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 Pt greeted in chair, A&Ox4, agreeable to OT evaluation. Pt lives with his wife in a multi story home, 4STE , B rails.  Pt reports he sleeps on first floor. Patient presents with impaired strength/ROM, pain, and sensation to RUE with block not completely resolved yet. These impairments result in a decreased ability to perform self care tasks requiring MOD A assist for UB/LB dressing and MAX A for donning sling. Pt instructed in sling/immobilizer mgt, elbow, wrist, hand ROM exercises for RUE, positioning and considerations for sleep, and home/routines modifications to maximize falls prevention, safety, and independence. Handout provided. OT adjusted sling/immobilizer to optimize positioning, and to maximize skin integrity/safety. Pt verbalized understanding of all education/training provided. Pt will benefit from skilled OT services to address these limitations and improve independence in daily tasks. Pt will be followed, recommend outpatient therapy per MD protocol following discharge. Pt is left in bedside chair, NAD, all needs met. Rn present in room.        Follow Up Recommendations  Follow surgeon's recommendation for DC plan and follow-up therapies    Equipment Recommendations   (shower chair when cleared to shower- pt reports he has one at home)    Recommendations for Other Services       Precautions / Restrictions Precautions Precautions: Shoulder Type of Shoulder Precautions: NWBing sling at all time except for ADL/exercise  AROM elbow, wrist, and hand  No P/AROM shoulder Shoulder Interventions: Shoulder sling/immobilizer Required  Braces or Orthoses: Sling Restrictions Weight Bearing Restrictions: Yes RUE Weight Bearing: Non weight bearing      Mobility Bed Mobility Overal bed mobility: Modified Independent                  Transfers Overall transfer level: Needs assistance   Transfers: Sit to/from Stand Sit to Stand: Supervision         General transfer comment: vcs for pacing    Balance Overall balance assessment: Needs assistance Sitting-balance support: No upper extremity supported Sitting balance-Leahy Scale: Normal                                     ADL either performed or assessed with clinical judgement   ADL Overall ADL's : Needs assistance/impaired Eating/Feeding: Independent               Upper Body Dressing : Moderate assistance Upper Body Dressing Details (indicate cue type and reason): hemi dressing technique, step by step vcs Lower Body Dressing: Moderate assistance Lower Body Dressing Details (indicate cue type and reason): frequent vcs, increased time due to use of non dominant hand Toilet Transfer: Min Psychiatric nurse Details (indicate cue type and reason): simulated at chair         Functional mobility during ADLs: Min guard       Vision Patient Visual Report: No change from baseline                  Pertinent Vitals/Pain Pain Assessment: No/denies pain     Hand Dominance Right   Extremity/Trunk Assessment Upper Extremity Assessment Upper Extremity Assessment: RUE deficits/detail  RUE Deficits / Details: No shoulder A/PROM; Pt with 1/4 full AROM R elbow, ROM in R wrist and hand WFL; PROM elbow, wrist, hand WFL RUE Sensation: decreased light touch (pt with block) RUE Coordination: WNL   Lower Extremity Assessment Lower Extremity Assessment: Overall WFL for tasks assessed       Communication Communication Communication: No difficulties   Cognition Arousal/Alertness: Awake/alert Behavior During Therapy: WFL for tasks  assessed/performed Overall Cognitive Status: Within Functional Limits for tasks assessed                                 General Comments: pt is A&ox4   General Comments                  Home Living Family/patient expects to be discharged to:: Private residence Living Arrangements: Spouse/significant other Available Help at Discharge: Family;Available 24 hours/day Type of Home: House Home Access: Stairs to enter [employer] of Steps: 4 Entrance Stairs-Rails: Right;Left Home Layout: Multi-level Alternate Level Stairs-Number of Steps: bedrooms upstairs; pt sleeps downstairs   Bathroom Shower/Tub: Teacher, early years/pre: Standard     Home Equipment: Cane - single point;Shower seat   Additional Comments: pt reports he does not use SAC      Prior Functioning/Environment Level of Independence: Independent                 OT Problem List: Decreased strength;Impaired balance (sitting and/or standing);Decreased knowledge of precautions;Decreased range of motion;Impaired UE functional use;Decreased knowledge of use of DME or AE      OT Treatment/Interventions: Self-care/ADL training;DME and/or AE instruction;Therapeutic activities;Balance training;Modalities;Therapeutic exercise    OT Goals(Current goals can be found in the care plan section) Acute Rehab OT Goals Patient Stated Goal: to go home OT Goal Formulation: With patient Time For Goal Achievement: 08/17/21 Potential to Achieve Goals: Good  OT Frequency: Min 2X/week    AM-PAC OT "6 Clicks" Daily Activity     Outcome Measure Help from another person eating meals?: None Help from another person taking care of personal grooming?: A Little Help from another person toileting, which includes using toliet, bedpan, or urinal?: A Little Help from another person bathing (including washing, rinsing, drying)?: A Little Help from another person to put on and taking off regular upper  body clothing?: A Little Help from another person to put on and taking off regular lower body clothing?: A Little 6 Click Score: 19   End of Session Equipment Utilized During Treatment: Other (comment) (sling) Nurse Communication: Mobility status;Precautions  Activity Tolerance: Patient tolerated treatment well Patient left: in chair;with call bell/phone within reach;with nursing/sitter in room  OT Visit Diagnosis: Unsteadiness on feet (R26.81);Other abnormalities of gait and mobility (R26.89)                Time: 1610-9604 OT Time Calculation (min): 46 min Charges:  OT General Charges $OT Visit: 1 Visit OT Evaluation $OT Eval Moderate Complexity: 1 Mod OT Treatments $Self Care/Home Management : 8-22 mins Shanon Payor, OTD OTR/L  08/03/21, 10:10 AM

## 2021-08-03 NOTE — Discharge Instructions (Signed)
Continue to wear sling right upper extremity.    Patient may shower. No bath or submerging the wound.    Take aspirin 81 mg dail for 30 days as directed for blood clot prevention.   Call [phone] with any questions, such as fever > 101.5 degrees, drainage from the wound or shortness of breath.

## 2021-08-05 ENCOUNTER — Encounter: Payer: PPO | Admitting: Dermatology

## 2021-08-05 LAB — SURGICAL PATHOLOGY

## 2021-08-09 DIAGNOSIS — M25611 Stiffness of right shoulder, not elsewhere classified: Secondary | ICD-10-CM | POA: Diagnosis not present

## 2021-08-09 DIAGNOSIS — M25511 Pain in right shoulder: Secondary | ICD-10-CM | POA: Diagnosis not present

## 2021-08-09 LAB — EXERCISE TOLERANCE TEST
Estimated workload: 4.7 METS
Exercise duration (min): 4 min
Exercise duration (sec): 24 s
MPHR: 141 {beats}/min
Peak HR: 109 {beats}/min
Percent HR: 77 %
Rest HR: 66 {beats}/min

## 2021-08-16 DIAGNOSIS — G20 Parkinson's disease: Secondary | ICD-10-CM | POA: Diagnosis not present

## 2021-08-16 DIAGNOSIS — E569 Vitamin deficiency, unspecified: Secondary | ICD-10-CM | POA: Diagnosis not present

## 2021-08-16 DIAGNOSIS — Z96611 Presence of right artificial shoulder joint: Secondary | ICD-10-CM | POA: Insufficient documentation

## 2021-08-16 DIAGNOSIS — E559 Vitamin D deficiency, unspecified: Secondary | ICD-10-CM | POA: Diagnosis not present

## 2021-08-19 DIAGNOSIS — M25611 Stiffness of right shoulder, not elsewhere classified: Secondary | ICD-10-CM | POA: Diagnosis not present

## 2021-08-19 DIAGNOSIS — M25511 Pain in right shoulder: Secondary | ICD-10-CM | POA: Diagnosis not present

## 2021-08-26 DIAGNOSIS — Z961 Presence of intraocular lens: Secondary | ICD-10-CM | POA: Diagnosis not present

## 2021-08-27 DIAGNOSIS — M25611 Stiffness of right shoulder, not elsewhere classified: Secondary | ICD-10-CM | POA: Diagnosis not present

## 2021-08-27 DIAGNOSIS — M25511 Pain in right shoulder: Secondary | ICD-10-CM | POA: Diagnosis not present

## 2021-09-01 DIAGNOSIS — M25511 Pain in right shoulder: Secondary | ICD-10-CM | POA: Diagnosis not present

## 2021-09-01 DIAGNOSIS — M25611 Stiffness of right shoulder, not elsewhere classified: Secondary | ICD-10-CM | POA: Diagnosis not present

## 2021-09-03 DIAGNOSIS — M7021 Olecranon bursitis, right elbow: Secondary | ICD-10-CM | POA: Diagnosis not present

## 2021-09-03 DIAGNOSIS — M25511 Pain in right shoulder: Secondary | ICD-10-CM | POA: Diagnosis not present

## 2021-09-03 DIAGNOSIS — M25611 Stiffness of right shoulder, not elsewhere classified: Secondary | ICD-10-CM | POA: Diagnosis not present

## 2021-09-06 DIAGNOSIS — G20 Parkinson's disease: Secondary | ICD-10-CM | POA: Diagnosis not present

## 2021-09-06 DIAGNOSIS — M25511 Pain in right shoulder: Secondary | ICD-10-CM | POA: Diagnosis not present

## 2021-09-06 DIAGNOSIS — M25611 Stiffness of right shoulder, not elsewhere classified: Secondary | ICD-10-CM | POA: Diagnosis not present

## 2021-09-08 ENCOUNTER — Ambulatory Visit: Payer: PPO | Admitting: Dermatology

## 2021-09-10 DIAGNOSIS — M25511 Pain in right shoulder: Secondary | ICD-10-CM | POA: Diagnosis not present

## 2021-09-10 DIAGNOSIS — M25611 Stiffness of right shoulder, not elsewhere classified: Secondary | ICD-10-CM | POA: Diagnosis not present

## 2021-09-13 ENCOUNTER — Other Ambulatory Visit: Payer: Self-pay

## 2021-09-13 ENCOUNTER — Ambulatory Visit: Payer: PPO | Admitting: Dermatology

## 2021-09-13 DIAGNOSIS — M25611 Stiffness of right shoulder, not elsewhere classified: Secondary | ICD-10-CM | POA: Diagnosis not present

## 2021-09-13 DIAGNOSIS — L578 Other skin changes due to chronic exposure to nonionizing radiation: Secondary | ICD-10-CM

## 2021-09-13 DIAGNOSIS — Z96611 Presence of right artificial shoulder joint: Secondary | ICD-10-CM | POA: Diagnosis not present

## 2021-09-13 DIAGNOSIS — Z85828 Personal history of other malignant neoplasm of skin: Secondary | ICD-10-CM

## 2021-09-13 DIAGNOSIS — L814 Other melanin hyperpigmentation: Secondary | ICD-10-CM | POA: Diagnosis not present

## 2021-09-13 DIAGNOSIS — L821 Other seborrheic keratosis: Secondary | ICD-10-CM | POA: Diagnosis not present

## 2021-09-13 DIAGNOSIS — D1800 Hemangioma unspecified site: Secondary | ICD-10-CM | POA: Diagnosis not present

## 2021-09-13 DIAGNOSIS — Z1283 Encounter for screening for malignant neoplasm of skin: Secondary | ICD-10-CM

## 2021-09-13 DIAGNOSIS — D229 Melanocytic nevi, unspecified: Secondary | ICD-10-CM | POA: Diagnosis not present

## 2021-09-13 DIAGNOSIS — M25511 Pain in right shoulder: Secondary | ICD-10-CM | POA: Diagnosis not present

## 2021-09-13 NOTE — Progress Notes (Signed)
   Follow-Up Visit   Subjective  Stephen Stein is a 80 y.o. male who presents for the following: Annual Exam (Patient here today for full body exam. He reports no new concern. ). Patient here for full body skin exam and skin cancer screening.  The following portions of the chart were reviewed this encounter and updated as appropriate:  Tobacco  Allergies  Meds  Problems  Med Hx  Surg Hx  Fam Hx     Review of Systems: No other skin or systemic complaints except as noted in HPI or Assessment and Plan.  Objective  Well appearing patient in no apparent distress; mood and affect are within normal limits.  A full examination was performed including scalp, head, eyes, ears, nose, lips, neck, chest, axillae, abdomen, back, buttocks, bilateral upper extremities, bilateral lower extremities, hands, feet, fingers, toes, fingernails, and toenails. All findings within normal limits unless otherwise noted below.   Assessment & Plan  Skin cancer screening Lentigines - Scattered tan macules - Due to sun exposure - Benign-appearing, observe - Recommend daily broad spectrum sunscreen SPF 30+ to sun-exposed areas, reapply every 2 hours as needed. - Call for any changes  Seborrheic Keratoses - Stuck-on, waxy, tan-brown papules and/or plaques  - Benign-appearing - Discussed benign etiology and prognosis. - Observe - Call for any changes  Melanocytic Nevi - Tan-brown and/or pink-flesh-colored symmetric macules and papules - Benign appearing on exam today - Observation - Call clinic for new or changing moles - Recommend daily use of broad spectrum spf 30+ sunscreen to sun-exposed areas.   Hemangiomas - Red papules - Discussed benign nature - Observe - Call for any changes  Actinic Damage - Chronic condition, secondary to cumulative UV/sun exposure - diffuse scaly erythematous macules with underlying dyspigmentation - Recommend daily broad spectrum sunscreen SPF 30+ to sun-exposed  areas, reapply every 2 hours as needed.  - Staying in the shade or wearing long sleeves, sun glasses (UVA+UVB protection) and wide brim hats (4-inch brim around the entire circumference of the hat) are also recommended for sun protection.  - Call for new or changing lesions.  History of Basal Cell Carcinoma of the Skin - No evidence of recurrence today right chest parasternal  - Recommend regular full body skin exams - Recommend daily broad spectrum sunscreen SPF 30+ to sun-exposed areas, reapply every 2 hours as needed.  - Call if any new or changing lesions are noted between office visits  Skin cancer screening performed today.  Return for 1 year tbse. IRuthell Rummage, CMA, am acting as scribe for Sarina Ser, MD. Documentation: I have reviewed the above documentation for accuracy and completeness, and I agree with the above.  Sarina Ser, MD

## 2021-09-13 NOTE — Patient Instructions (Signed)

## 2021-09-14 ENCOUNTER — Encounter: Payer: Self-pay | Admitting: Dermatology

## 2021-09-17 DIAGNOSIS — M25611 Stiffness of right shoulder, not elsewhere classified: Secondary | ICD-10-CM | POA: Diagnosis not present

## 2021-09-17 DIAGNOSIS — M25511 Pain in right shoulder: Secondary | ICD-10-CM | POA: Diagnosis not present

## 2021-09-21 ENCOUNTER — Telehealth: Payer: Self-pay | Admitting: Internal Medicine

## 2021-09-21 NOTE — Telephone Encounter (Signed)
Was able to speak with Dr. Caryl Comes, he reports if pt and wife feel they do not need the monitor at this time, if pt has been able to proceed with his daily activities, not having any cardiac symptomatic episodes of bradycardia or irregular heart rhythms outside his usual rhythms then we may cancel the ZIO monitor to be mailed.   Wife thankful for the call, think it will be okay to cancel the ZIO monitor, reports Stephen Stein has been able to conduct yard work without issues, has been mowing the yards, trimming hedges, and other activities without any issues. Wife reports she is okay with following up with Dr. Caryl Comes in one year as stated at last [place] in June.   Zio to be cancel at this time.

## 2021-09-21 NOTE — Telephone Encounter (Signed)
Please call to discuss monitor order that was placed on 6/30.

## 2021-09-24 ENCOUNTER — Ambulatory Visit: Payer: PPO

## 2021-10-17 ENCOUNTER — Other Ambulatory Visit: Payer: Self-pay | Admitting: Family Medicine

## 2021-10-17 DIAGNOSIS — E782 Mixed hyperlipidemia: Secondary | ICD-10-CM

## 2021-10-17 NOTE — Telephone Encounter (Signed)
Requested Prescriptions  Pending Prescriptions Disp Refills  . rosuvastatin (CRESTOR) 10 MG tablet [Pharmacy Med Name: ROSUVASTATIN CALCIUM 10 MG TAB] 90 tablet 0    Sig: TAKE 1 TABLET BY MOUTH EVERYDAY AT BEDTIME     Cardiovascular:  Antilipid - Statins Failed - 10/17/2021 12:46 AM      Failed - Total Cholesterol in normal range and within 360 days    Cholesterol, Total  Date Value Ref Range Status  06/01/2016 110 100 - 199 mg/dL Final   Cholesterol  Date Value Ref Range Status  10/20/2020 131 <200 mg/dL Final         Failed - LDL in normal range and within 360 days    LDL Cholesterol (Calc)  Date Value Ref Range Status  10/20/2020 64 mg/dL (calc) Final    Comment:    Reference range: <100 . Desirable range <100 mg/dL for primary prevention;   <70 mg/dL for patients with CHD or diabetic patients  with > or = 2 CHD risk factors. [name] LDL-C is now calculated using the Martin-Hopkins  calculation, which is a validated novel method providing  better accuracy than the Friedewald equation in the  estimation of LDL-C.  Cresenciano Genre et al. Annamaria Helling. 7681;157(26): 2061-2068  (http://education.[employer].com/faq/FAQ164)          Failed - HDL in normal range and within 360 days    HDL  Date Value Ref Range Status  10/20/2020 37 (L) > OR = 40 mg/dL Final  06/01/2016 39 (L) >39 mg/dL Final         Failed - Triglycerides in normal range and within 360 days    Triglycerides  Date Value Ref Range Status  10/20/2020 241 (H) <150 mg/dL Final    Comment:    . If a non-fasting specimen was collected, consider repeat triglyceride testing on a fasting specimen if clinically indicated.  Yates Decamp et al. J. of Clin. Lipidol. 2035;5:974-163. [name]          Passed - Patient is not pregnant      Passed - Valid encounter within last 12 months    Recent Outpatient Visits          6 months ago Lower urinary tract symptoms (LUTS)   [facility] Stephen Grana, Stephen Stein   12  months ago Mixed hyperlipidemia   [facility] Stephen Grana, Stephen Stein   1 year ago Protein-calorie malnutrition, severe [facility])   [facility] Stephen Grana, Stephen Stein   2 years ago Preoperative examination   [place], Stephen Stein   2 years ago Chronic pain of both shoulders   [place], Stephen Born, Stephen Stein      Future Appointments            In 4 days Stephen Grana, Stephen Stein Stephen Stein, Stephen Stein   In 1 month  [facility], Stephen Stein   In 11 months Ralene Bathe, MD [place]

## 2021-10-19 DIAGNOSIS — H903 Sensorineural hearing loss, bilateral: Secondary | ICD-10-CM | POA: Diagnosis not present

## 2021-10-21 ENCOUNTER — Other Ambulatory Visit: Payer: Self-pay

## 2021-10-21 ENCOUNTER — Ambulatory Visit (INDEPENDENT_AMBULATORY_CARE_PROVIDER_SITE_OTHER): Payer: PPO | Admitting: Unknown Physician Specialty

## 2021-10-21 ENCOUNTER — Encounter: Payer: Self-pay | Admitting: Unknown Physician Specialty

## 2021-10-21 DIAGNOSIS — G20 Parkinson's disease: Secondary | ICD-10-CM | POA: Diagnosis not present

## 2021-10-21 DIAGNOSIS — K5900 Constipation, unspecified: Secondary | ICD-10-CM | POA: Insufficient documentation

## 2021-10-21 DIAGNOSIS — J309 Allergic rhinitis, unspecified: Secondary | ICD-10-CM | POA: Insufficient documentation

## 2021-10-21 DIAGNOSIS — E782 Mixed hyperlipidemia: Secondary | ICD-10-CM | POA: Diagnosis not present

## 2021-10-21 HISTORY — DX: Constipation, unspecified: K59.00

## 2021-10-21 MED ORDER — ROSUVASTATIN CALCIUM 10 MG PO TABS: 10.0000 mg | ORAL_TABLET | Freq: Every day | ORAL | 3 refills | Status: DC

## 2021-10-21 MED ORDER — AZELASTINE HCL 0.1 % NA SOLN: 2.0000 | Freq: Two times a day (BID) | NASAL | 1 refills | Status: DC

## 2021-10-21 NOTE — Assessment & Plan Note (Signed)
Per Dr. Brigitte Pulse

## 2021-10-21 NOTE — Assessment & Plan Note (Addendum)
Check lipid panel, CMP and refill medications

## 2021-10-21 NOTE — Progress Notes (Signed)
BP 126/74   Pulse 84   Temp 98.2 F (36.8 C)   Resp 16   Ht 5\' 10"  (1.778 m)   Wt 211 lb (95.7 kg)   SpO2 97%   BMI 30.28 kg/m    Subjective:    Patient ID: Stephen Stein, male    DOB: 14-May-1941, 80 y.o.   MRN: [id number]  HPI: Stephen Stein is a 80 y.o. male  Chief Complaint  Patient presents with   Follow-up   Allergic rhinnitis Needs a refill of Astelin nasal spray.  Helping somewhat  Hyperlipidemia Using medications without problems:yes No Muscle aches  Diet compliance:Excellent diet Exercise: Was exercising regularly but recently strained shoulders  Parkinson's States he feels his muscles are stiffer, struggling with memory and sleeping more than expected.  Seeing Dr. Brigitte Pulse    Depression screen [facility] 2/9 10/21/2021 04/20/2021 12/03/2020 10/20/2020 11/25/2019  Decreased Interest 1 1 0 1 0  Down, Depressed, Hopeless 1 1 0 1 0  PHQ - 2 Score 2 2 0 2 0  Altered sleeping 1 1 - - 0  Tired, decreased energy 3 1 - - 0  Change in appetite 0 0 - - 0  Feeling bad or failure about yourself  0 0 - - 0  Trouble concentrating 0 1 - - 0  Moving slowly or fidgety/restless 0 0 - - 0  Suicidal thoughts 0 0 - - 0  PHQ-9 Score 6 5 - - 0  Difficult doing work/chores - Somewhat difficult - - Not difficult at all  Some recent data might be hidden    Relevant past medical, surgical, family and social history reviewed and updated as indicated. Interim medical history since our last visit reviewed. Allergies and medications reviewed and updated.  Review of Systems  Constitutional: Negative.   Respiratory: Negative.    Cardiovascular: Negative.   Gastrointestinal:        Constipation about n3 times and preventing with Ducolax 2 tabs nightly  Musculoskeletal:        Shoulder pain today. Takes Tylenol or Meloxicam  Skin: Negative.    Per HPI unless specifically indicated above     Objective:    BP 126/74   Pulse 84   Temp 98.2 F (36.8 C)   Resp 16   Ht 5\' 10"  (1.778 m)    Wt 211 lb (95.7 kg)   SpO2 97%   BMI 30.28 kg/m   Wt Readings from Last 3 Encounters:  10/21/21 211 lb (95.7 kg)  08/02/21 210 lb (95.3 kg)  07/26/21 210 lb (95.3 kg)    Physical Exam Constitutional:      General: He is not in acute distress.    Appearance: Normal appearance. He is well-developed.  HENT:     Head: Normocephalic and atraumatic.  Eyes:     General: Lids are normal. No scleral icterus.       Right eye: No discharge.        Left eye: No discharge.     Conjunctiva/sclera: Conjunctivae normal.  Neck:     Vascular: No carotid bruit or JVD.  Cardiovascular:     Rate and Rhythm: Normal rate and regular rhythm.     Heart sounds: Normal heart sounds.  Pulmonary:     Effort: Pulmonary effort is normal. No respiratory distress.     Breath sounds: Normal breath sounds.  Abdominal:     Palpations: There is no hepatomegaly or splenomegaly.  Musculoskeletal:  General: Normal range of motion.     Cervical back: Normal range of motion and neck supple.  Skin:    General: Skin is warm and dry.     Coloration: Skin is not pale.     Findings: No rash.  Neurological:     Mental Status: He is alert and oriented to person, place, and time.  Psychiatric:        Behavior: Behavior normal.        Thought Content: Thought content normal.        Judgment: Judgment normal.     Assessment & Plan:   Problem List Items Addressed This Visit       Unprioritized   Allergic rhinitis    Refill Astelin nasal spray      Constipation    Recommended nightly Ducolax, will switch to Miralax and titrate off of Ducolax      Hyperlipidemia    Check lipid panel, CMP and refill medications      Relevant Medications   rosuvastatin (CRESTOR) 10 MG tablet   Other Relevant Orders   Comprehensive metabolic panel   Lipid panel   Parkinson's disease ([place])    Per Dr. Brigitte Pulse        Follow up plan: Return in about 6 months (around 04/21/2022).

## 2021-10-21 NOTE — Assessment & Plan Note (Signed)
Refill Astelin nasal spray

## 2021-10-21 NOTE — Assessment & Plan Note (Signed)
Recommended nightly Ducolax, will switch to Miralax and titrate off of Ducolax

## 2021-11-12 ENCOUNTER — Other Ambulatory Visit: Payer: Self-pay | Admitting: Unknown Physician Specialty

## 2021-11-12 NOTE — Telephone Encounter (Signed)
Requested Prescriptions  Pending Prescriptions Disp Refills  . Azelastine HCl 137 MCG/SPRAY SOLN [Pharmacy Med Name: AZELASTINE 0.1% (137 MCG) SPRY] 9 mL 1    Sig: PLACE 2 SPRAYS INTO BOTH NOSTRILS 2 (TWO) TIMES DAILY     Ear, Nose, and Throat: Nasal Preparations - Antiallergy Passed - 11/12/2021  2:01 PM      Passed - Valid encounter within last 12 months    Recent Outpatient Visits          3 weeks ago Constipation, unspecified constipation type   [facility] Kathrine Haddock, NP   6 months ago Lower urinary tract symptoms (LUTS)   [facility] Delsa Grana, PA-C   1 year ago Mixed hyperlipidemia   [facility] Delsa Grana, PA-C   1 year ago Protein-calorie malnutrition, severe [facility])   [facility] Delsa Grana, PA-C   2 years ago Preoperative examination   [place], NP      Future Appointments            In 3 weeks  [facility] [place]   In 5 months Delsa Grana, PA-C [facility] [place]   In 10 months Ralene Bathe, MD [place]

## 2021-12-07 ENCOUNTER — Ambulatory Visit (INDEPENDENT_AMBULATORY_CARE_PROVIDER_SITE_OTHER): Payer: PPO

## 2021-12-07 ENCOUNTER — Other Ambulatory Visit: Payer: Self-pay | Admitting: Family Medicine

## 2021-12-07 VITALS — BP 110/70 | HR 75 | Temp 97.4°F | Resp 16 | Ht 70.0 in | Wt 205.4 lb

## 2021-12-07 DIAGNOSIS — Z0000 Encounter for general adult medical examination without abnormal findings: Secondary | ICD-10-CM | POA: Diagnosis not present

## 2021-12-07 NOTE — Progress Notes (Signed)
Subjective:   Stephen Stein is a 80 y.o. male who presents for Medicare Annual/Subsequent preventive examination.  Review of Systems     Cardiac Risk Factors include: advanced age (>12men, >70 women);dyslipidemia     Objective:    Today's Vitals   12/07/21 1545  BP: 110/70  Pulse: 75  Resp: 16  Temp: (!) 97.4 F (36.3 C)  TempSrc: Oral  SpO2: 97%  Weight: 205 lb 6.4 oz (93.2 kg)  Height: 5\' 10"  (1.778 m)   Body mass index is 29.47 kg/m.  Advanced Directives 12/07/2021 08/02/2021 12/23/2020 12/09/2020 12/03/2020 09/11/2019 09/06/2019  Does Patient Have a Medical Advance Directive? Yes No Yes Yes Yes Yes Yes  Type of Paramedic of [place];Living will - [place];Living will [place];Living will [place];Living will [place];Living will [place];Living will  Does patient want to make changes to medical advance directive? - - No - Patient declined No - Patient declined - No - Patient declined No - Patient declined  Copy of [place] in Chart? Yes - validated most recent copy scanned in chart (See row information) - No - copy requested Yes - validated most recent copy scanned in chart (See row information) Yes - validated most recent copy scanned in chart (See row information) No - copy requested No - copy requested  Would patient like information on creating a medical advance directive? - No - Patient declined - - - - -    Current Medications (verified) Outpatient Encounter Medications as of 12/07/2021  Medication Sig   acetaminophen (TYLENOL) 500 MG tablet Take 1,000 mg by mouth every 6 (six) hours as needed for moderate pain.   Azelastine HCl 137 MCG/SPRAY SOLN PLACE 2 SPRAYS INTO BOTH NOSTRILS 2 (TWO) TIMES DAILY   carbidopa-levodopa (SINEMET IR) 25-100 MG tablet Take 2 tablets by mouth 3 (three) times daily.   Cholecalciferol (VITAMIN D3  PO) Take 1 tablet by mouth daily.   docusate sodium (COLACE) 100 MG capsule Take 1 capsule (100 mg total) by mouth 2 (two) times daily.   glucosamine-chondroitin 500-400 MG tablet Take 1 tablet by mouth daily.   meloxicam (MOBIC) 15 MG tablet Take 15 mg by mouth daily.   Multiple Vitamin (MULTIVITAMIN WITH MINERALS) TABS tablet Take 1 tablet by mouth daily.   rOPINIRole (REQUIP) 1 MG tablet Take 1 mg by mouth 4 (four) times daily.   rosuvastatin (CRESTOR) 10 MG tablet Take 1 tablet (10 mg total) by mouth daily.   sertraline (ZOLOFT) 100 MG tablet Take 100 mg by mouth daily.   tamsulosin (FLOMAX) 0.4 MG CAPS capsule Take 1 capsule (0.4 mg total) by mouth at bedtime.   carbidopa-levodopa (SINEMET CR) 50-200 MG tablet Take 1 tablet by mouth at bedtime.   [DISCONTINUED] aspirin EC 81 MG tablet Take 1 tablet (81 mg total) by mouth daily. Swallow whole.   [DISCONTINUED] HYDROcodone-acetaminophen (NORCO/VICODIN) 5-325 MG tablet Take 1 tablet by mouth every 4 (four) hours as needed for moderate pain (pain score 4-6).   [DISCONTINUED] methocarbamol (ROBAXIN) 500 MG tablet Take 1 tablet (500 mg total) by mouth every 8 (eight) hours as needed for muscle spasms. (Patient not taking: Reported on 10/21/2021)   [DISCONTINUED] vitamin B-12 (CYANOCOBALAMIN) 1000 MCG tablet Take 1,000 mcg by mouth daily.  (Patient not taking: Reported on 10/21/2021)   No facility-administered encounter medications on file as of 12/07/2021.    Allergies (verified) Patient has  no known allergies.   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 08/2018 s/p lap chole.   BPH (benign prostatic hyperplasia)    Carotid atherosclerosis, bilateral    a. 01/2019 Carotid U/S: <50% bilat ICA stenosis.   Coronary artery calcification seen on CT scan    a. 11/2015 Cardiac CT: Cor Ca2+ = 892 (80th %'ile).  Diffuse Ca2+ in all 3 major coronary arteries; b. 02/2016 MV: HTN response to exercise. Abnl ecg w/ horizontal inflat ST depression, but no ischemia/infarct on imaging; c. 09/2018 MV: EF 64%, no ischemia. Very sm region of mild distal antsept defect, likely 2/2 lbbb.   Depression    Grade I diastolic dysfunction    Hepatic steatosis    Hx of basal cell carcinoma 08/01/2019   R chest parasternal   Hyperlipidemia    Hypertension    Irregular heartbeat/Palpitations    a. Has been taking Atenolol for 20 years   LBBB (left bundle branch block)    Left ventricular hypokinesis    a. 09/2018 Echo: EF 45-50%, mod LVH, diff HK. Gr1 DD. Triv AI. Mildly dil Ao root. Mildly reduced RV fxn.   Lightheadedness    Mild cognitive impairment    NSVT (nonsustained ventricular tachycardia)    a. 11/2018 Zio monitor - 13 beats of NSVT--asymptomatic.   Orthostatic hypotension    a. 11/2018 pressures improved w/ midodrine/florinef.   Parkinson's disease ([place])    Sinus bradycardia    a. 11/2018 Zio monitor: Avg HR 57 (min 28). Intermittent Mobitz I.   Past Surgical History:  Procedure Laterality Date   CATARACT EXTRACTION W/PHACO Left 12/09/2020   Procedure: CATARACT EXTRACTION PHACO AND INTRAOCULAR LENS PLACEMENT (IOC) LEFT 6.94 01:09.7 9.9% ;  Surgeon: Leandrew Koyanagi, MD;  Location: [facility];  Service: Ophthalmology;  Laterality: Left;   CATARACT EXTRACTION W/PHACO Right 12/23/2020   Procedure: CATARACT EXTRACTION PHACO AND INTRAOCULAR LENS PLACEMENT ([place]) RIGHT MALYUGIN;  Surgeon: Leandrew Koyanagi, MD;  Location: [facility];  Service: Ophthalmology;  Laterality: Right;  8.44 1:14.6 11.3%   CHOLECYSTECTOMY N/A 09/13/2018   Procedure: LAPAROSCOPIC CHOLECYSTECTOMY;  Surgeon: Jules Husbands, MD;  Location: [facility];  Service: General;  Laterality: N/A;   COLONOSCOPY  2008   ESOPHAGOGASTRODUODENOSCOPY (EGD) WITH PROPOFOL N/A 08/20/2018   Procedure: ESOPHAGOGASTRODUODENOSCOPY (EGD)  WITH PROPOFOL;  Surgeon: Lucilla Lame, MD;  Location: [facility];  Service: Endoscopy;  Laterality: N/A;   TONSILLECTOMY AND ADENOIDECTOMY  1955   TOTAL SHOULDER ARTHROPLASTY Left 09/11/2019   Procedure: TOTAL SHOULDER ARTHROPLASTY;  Surgeon: Lovell Sheehan, MD;  Location: [facility];  Service: Orthopedics;  Laterality: Left;   TOTAL SHOULDER ARTHROPLASTY Right 08/02/2021   Procedure: TOTAL SHOULDER ARTHROPLASTY;  Surgeon: Lovell Sheehan, MD;  Location: [facility];  Service: Orthopedics;  Laterality: Right;   Family History  Problem Relation Age of Onset   Cancer Mother        lung   Heart disease Father    Hearing loss Sister        coclear implant   Hearing loss Brother    Cancer Paternal Grandmother    Cancer Son        colon cancer   Colon cancer Neg Hx    Stomach cancer Neg Hx    Social History   Socioeconomic History   Marital status:  Married    Spouse name: Not on file   Number of children: 2   Years of education: Not on file   Highest education level: Bachelor's degree (e.g., BA, AB, BS)  Occupational History   Not on file  Tobacco Use   Smoking status: Former    Packs/day: 1.50    Years: 35.00    Pack years: 52.50    Types: Cigarettes    Quit date: 06/22/1992    Years since quitting: 29.4   Smokeless tobacco: Former    Quit date: 06/22/1992  Vaping Use   Vaping Use: Never used  Substance and Sexual Activity   Alcohol use: Yes    Alcohol/week: 1.0 standard drink    Types: 1 Cans of beer per week    Comment: occasional   Drug use: No   Sexual activity: Not Currently  Other Topics Concern   Not on file  Social History Narrative   Not on file   Social Determinants of Health   Financial Resource Strain: Low Risk    Difficulty of Paying Living Expenses: Not hard at all  Food Insecurity: No Food Insecurity   Worried About Charity fundraiser in the Last Year: Never true   [place] in the Last Year: Never true  Transportation Needs: No Transportation  Needs   Lack of Transportation (Medical): No   Lack of Transportation (Non-Medical): No  Physical Activity: Sufficiently Active   Days of Exercise per Week: 5 days   Minutes of Exercise per Session: 30 min  Stress: No Stress Concern Present   Feeling of Stress : Not at all  Social Connections: Moderately Integrated   Frequency of Communication with Friends and Family: More than three times a week   Frequency of Social Gatherings with Friends and Family: Three times a week   Attends Religious Services: More than 4 times per year   Active Member of Clubs or Organizations: No   Attends Archivist Meetings: Never   Marital Status: Married    Tobacco Counseling Counseling given: Not Answered   Clinical Intake:  Pre-visit preparation completed: Yes  Pain : No/denies pain     BMI - recorded: 29.47 Nutritional Status: BMI 25 -29 Overweight Nutritional Risks: None Diabetes: No  How often do you need to have someone help you when you read instructions, pamphlets, or other written materials from your doctor or pharmacy?: 1 - Never    Interpreter Needed?: No  Information entered by :: Clemetine Marker LPN   Activities of Daily Living In your present state of health, do you have any difficulty performing the following activities: 12/07/2021 10/21/2021  Hearing? Tempie Donning  Vision? N N  Difficulty concentrating or making decisions? Tempie Donning  Walking or climbing stairs? Y Y  Dressing or bathing? Y Y  Doing errands, shopping? N Y  Conservation officer, nature and eating ? N -  Using the Toilet? N -  In the past six months, have you accidently leaked urine? N -  Do you have problems with loss of bowel control? N -  Managing your Medications? N -  Managing your Finances? N -  Housekeeping or managing your Housekeeping? N -  Some recent data might be hidden    Patient Care Team: Delsa Grana, PA-C as PCP - General (Family Medicine) Minna Merritts, MD as Consulting Physician  (Cardiology) Vladimir Crofts, MD as Consulting Physician (Neurology) Ralene Bathe, MD (Dermatology)  Indicate any recent Medical Services you  may have received from other than [facility] providers in the past year (date may be approximate).     Assessment:   This is a routine wellness examination for Ajdin.  Hearing/Vision screen Hearing Screening - Comments:: Pt wears hearing aids maintained by [employer]  Vision Screening - Comments:: [facility] screenings [facility]  Dietary issues and exercise activities discussed: Current Exercise Habits: Home exercise routine, Type of exercise: walking;Other - see comments (yard work), Time (Minutes): 30, Frequency (Times/Week): 5, Weekly Exercise (Minutes/Week): 150, Intensity: Moderate, Exercise limited by: neurologic condition(s)   Goals Addressed   None    Depression Screen PHQ 2/9 Scores 12/07/2021 10/21/2021 04/20/2021 12/03/2020 10/20/2020 11/25/2019 08/26/2019  PHQ - 2 Score 0 2 2 0 2 0 0  PHQ- 9 Score 3 6 5  - - 0 0    Fall Risk Fall Risk  12/07/2021 10/21/2021 04/20/2021 12/03/2020 10/20/2020  Falls in the past year? 1 1 1 1 1   Number falls in past yr: 1 1 1 1 1   Injury with Fall? 0 0 0 0 0  Comment - - - - -  Risk for fall due to : History of fall(s) No Fall Risks Impaired balance/gait;History of fall(s);Impaired mobility History of fall(s);Impaired balance/gait History of fall(s)  Follow up Falls prevention discussed Falls prevention discussed - Falls prevention discussed Falls evaluation completed    FALL RISK PREVENTION PERTAINING TO THE HOME:  Any stairs in or around the home? Yes  If so, are there any without handrails? No  Home free of loose throw rugs in walkways, pet beds, electrical cords, etc? Yes  Adequate lighting in your home to reduce risk of falls? Yes   ASSISTIVE DEVICES UTILIZED TO PREVENT FALLS:  Life alert? No  Use of a cane, walker or w/c? No  Grab bars in the bathroom? Yes  Shower chair or bench  in shower? No  Elevated toilet seat or a handicapped toilet? Yes   TIMED UP AND GO:  Was the test performed? Yes .  Length of time to ambulate 10 feet: 5 sec.   Gait steady and fast without use of assistive device  Cognitive Function: Normal cognitive status assessed by direct observation by this Nurse Health Advisor. No abnormalities found.       6CIT Screen 07/04/2019 10/12/2018  What Year? 0 points 4 points  What month? 0 points 0 points  What time? 0 points 0 points  Count back from 20 0 points 0 points  Months in reverse 2 points 4 points  Repeat phrase 2 points 4 points  Total Score 4 12    Immunizations Immunization History  Administered Date(s) Administered   Influenza, High Dose Seasonal PF 09/16/2017, 09/24/2018, 08/29/2019, 09/29/2020, 09/22/2021   Influenza-Unspecified 08/26/2014, 09/22/2015   Moderna Sars-Covid-2 Vaccination 01/07/2020, 02/22/2020, 10/19/2020   Pneumococcal Conjugate-13 11/24/2014, 09/22/2015   Pneumococcal Polysaccharide-23 09/24/2018   Tdap 06/21/2011   Zoster Recombinat (Shingrix) 01/21/2021    TDAP status: Due, Education has been provided regarding the importance of this vaccine. Advised may receive this vaccine at local pharmacy or Health Dept. Aware to provide a copy of the vaccination record if obtained from local pharmacy or Health Dept. Verbalized acceptance and understanding.  Flu Vaccine status: Up to date  Pneumococcal vaccine status: Up to date  Covid-19 vaccine status: Completed vaccines  Qualifies for Shingles Vaccine? Yes   Zostavax completed No   Shingrix Completed?: Yes  Screening Tests Health Maintenance  Topic Date Due   URINE MICROALBUMIN  Never done   COVID-19 Vaccine (4 - Booster for Moderna series) 12/14/2020   Zoster Vaccines- Shingrix (2 of 2) 03/18/2021   TETANUS/TDAP  06/20/2021   Pneumonia Vaccine 41+ Years old  Completed   INFLUENZA VACCINE  Completed   Hepatitis C Screening  Completed   HPV VACCINES   Aged Out    Health Maintenance  Health Maintenance Due  Topic Date Due   URINE MICROALBUMIN  Never done   COVID-19 Vaccine (4 - Booster for Moderna series) 12/14/2020   Zoster Vaccines- Shingrix (2 of 2) 03/18/2021   TETANUS/TDAP  06/20/2021    Colorectal cancer screening: No longer required.   Lung Cancer Screening: (Low Dose CT Chest recommended if Age 29-80 years, 30 pack-year currently smoking OR have quit w/in 15years.) does not qualify.   Additional Screening:  Hepatitis C Screening: does qualify; Completed 04/20/21  Vision Screening: Recommended annual ophthalmology exams for early detection of glaucoma and other disorders of the eye. Is the patient up to date with their annual eye exam?  Yes  Who is the provider or what is the name of the office in which the patient attends annual eye exams? [facility].   Dental Screening: Recommended annual dental exams for proper oral hygiene  Community Resource Referral / Chronic Care Management: CRR required this visit?  No   CCM required this visit?  No      Plan:     I have personally reviewed and noted the following in the patients chart:   Medical and social history Use of alcohol, tobacco or illicit drugs  Current medications and supplements including opioid prescriptions. Patient is not currently taking opioid prescriptions. Functional ability and status Nutritional status Physical activity Advanced directives List of other physicians Hospitalizations, surgeries, and ER visits in previous 12 months Vitals Screenings to include cognitive, depression, and falls Referrals and appointments  In addition, I have reviewed and discussed with patient certain preventive protocols, quality metrics, and best practice recommendations. A written personalized care plan for preventive services as well as general preventive health recommendations were provided to patient.     Clemetine Marker, LPN   02/05/1551   Nurse  Notes: none

## 2021-12-07 NOTE — Patient Instructions (Signed)
Stephen Stein , Thank you for taking time to come for your Medicare Wellness Visit. I appreciate your ongoing commitment to your health goals. Please review the following plan we discussed and let me know if I can assist you in the future.   Screening recommendations/referrals: Colonoscopy: no longer required Recommended yearly ophthalmology/optometry visit for glaucoma screening and checkup Recommended yearly dental visit for hygiene and checkup  Vaccinations: Influenza vaccine: done 09/22/21 Pneumococcal vaccine: done 09/24/18 Tdap vaccine: due Shingles vaccine: done 01/21/21; need second dose information   Covid-19: done 01/07/20, 02/22/20 & 10/19/20; please bring your vaccine card to your next appointment for additional booster information  Conditions/risks identified: Recommend fall prevention in the home  Next appointment: Follow up in one year for your annual wellness visit.   Preventive Care 23 Years and Older, Male Preventive care refers to lifestyle choices and visits with your health care provider that can promote health and wellness. What does preventive care include? A yearly physical exam. This is also called an annual well check. Dental exams once or twice a year. Routine eye exams. Ask your health care provider how often you should have your eyes checked. Personal lifestyle choices, including: Daily care of your teeth and gums. Regular physical activity. Eating a healthy diet. Avoiding tobacco and drug use. Limiting alcohol use. Practicing safe sex. Taking low doses of aspirin every day. Taking vitamin and mineral supplements as recommended by your health care provider. What happens during an annual well check? The services and screenings done by your health care provider during your annual well check will depend on your age, overall health, lifestyle risk factors, and family history of disease. Counseling  Your health care provider may ask you questions about  your: Alcohol use. Tobacco use. Drug use. Emotional well-being. Home and relationship well-being. Sexual activity. Eating habits. History of falls. Memory and ability to understand (cognition). Work and work Statistician. Screening  You may have the following tests or measurements: Height, weight, and BMI. Blood pressure. Lipid and cholesterol levels. These may be checked every 5 years, or more frequently if you are over 66 years old. Skin check. Lung cancer screening. You may have this screening every year starting at age 4 if you have a 30-pack-year history of smoking and currently smoke or have quit within the past 15 years. Fecal occult blood test (FOBT) of the stool. You may have this test every year starting at age 5. Flexible sigmoidoscopy or colonoscopy. You may have a sigmoidoscopy every 5 years or a colonoscopy every 10 years starting at age 69. Prostate cancer screening. Recommendations will vary depending on your family history and other risks. Hepatitis C blood test. Hepatitis B blood test. Sexually transmitted disease (STD) testing. Diabetes screening. This is done by checking your blood sugar (glucose) after you have not eaten for a while (fasting). You may have this done every 1-3 years. Abdominal aortic aneurysm (AAA) screening. You may need this if you are a current or former smoker. Osteoporosis. You may be screened starting at age 50 if you are at high risk. Talk with your health care provider about your test results, treatment options, and if necessary, the need for more tests. Vaccines  Your health care provider may recommend certain vaccines, such as: Influenza vaccine. This is recommended every year. Tetanus, diphtheria, and acellular pertussis (Tdap, Td) vaccine. You may need a Td booster every 10 years. Zoster vaccine. You may need this after age 44. Pneumococcal 13-valent conjugate (PCV13) vaccine. One dose is  recommended after age 75. Pneumococcal  polysaccharide (PPSV23) vaccine. One dose is recommended after age 89. Talk to your health care provider about which screenings and vaccines you need and how often you need them. This information is not intended to replace advice given to you by your health care provider. Make sure you discuss any questions you have with your health care provider. Document Released: 01/08/2016 Document Revised: 08/31/2016 Document Reviewed: 10/13/2015 Elsevier Interactive Patient Education  2017 [place] Prevention in the Home Falls can cause injuries. They can happen to people of all ages. There are many things you can do to make your home safe and to help prevent falls. What can I do on the outside of my home? Regularly fix the edges of walkways and driveways and fix any cracks. Remove anything that might make you trip as you walk through a door, such as a raised step or threshold. Trim any bushes or trees on the path to your home. Use bright outdoor lighting. Clear any walking paths of anything that might make someone trip, such as rocks or tools. Regularly check to see if handrails are loose or broken. Make sure that both sides of any steps have handrails. Any raised decks and porches should have guardrails on the edges. Have any leaves, snow, or ice cleared regularly. Use sand or salt on walking paths during winter. Clean up any spills in your garage right away. This includes oil or grease spills. What can I do in the bathroom? Use night lights. Install grab bars by the toilet and in the tub and shower. Do not use towel bars as grab bars. Use non-skid mats or decals in the tub or shower. If you need to sit down in the shower, use a plastic, non-slip stool. Keep the floor dry. Clean up any water that spills on the floor as soon as it happens. Remove soap buildup in the tub or shower regularly. Attach bath mats securely with double-sided non-slip rug tape. Do not have throw rugs and other  things on the floor that can make you trip. What can I do in the bedroom? Use night lights. Make sure that you have a light by your bed that is easy to reach. Do not use any sheets or blankets that are too big for your bed. They should not hang down onto the floor. Have a firm chair that has side arms. You can use this for support while you get dressed. Do not have throw rugs and other things on the floor that can make you trip. What can I do in the kitchen? Clean up any spills right away. Avoid walking on wet floors. Keep items that you use a lot in easy-to-reach places. If you need to reach something above you, use a strong step stool that has a grab bar. Keep electrical cords out of the way. Do not use floor polish or wax that makes floors slippery. If you must use wax, use non-skid floor wax. Do not have throw rugs and other things on the floor that can make you trip. What can I do with my stairs? Do not leave any items on the stairs. Make sure that there are handrails on both sides of the stairs and use them. Fix handrails that are broken or loose. Make sure that handrails are as long as the stairways. Check any carpeting to make sure that it is firmly attached to the stairs. Fix any carpet that is loose or worn. Avoid having  throw rugs at the top or bottom of the stairs. If you do have throw rugs, attach them to the floor with carpet tape. Make sure that you have a light switch at the top of the stairs and the bottom of the stairs. If you do not have them, ask someone to add them for you. What else can I do to help prevent falls? Wear shoes that: Do not have high heels. Have rubber bottoms. Are comfortable and fit you well. Are closed at the toe. Do not wear sandals. If you use a stepladder: Make sure that it is fully opened. Do not climb a closed stepladder. Make sure that both sides of the stepladder are locked into place. Ask someone to hold it for you, if possible. Clearly  mark and make sure that you can see: Any grab bars or handrails. First and last steps. Where the edge of each step is. Use tools that help you move around (mobility aids) if they are needed. These include: Canes. Walkers. Scooters. Crutches. Turn on the lights when you go into a dark area. Replace any light bulbs as soon as they burn out. Set up your furniture so you have a clear path. Avoid moving your furniture around. If any of your floors are uneven, fix them. If there are any pets around you, be aware of where they are. Review your medicines with your doctor. Some medicines can make you feel dizzy. This can increase your chance of falling. Ask your doctor what other things that you can do to help prevent falls. This information is not intended to replace advice given to you by your health care provider. Make sure you discuss any questions you have with your health care provider. Document Released: 10/08/2009 Document Revised: 05/19/2016 Document Reviewed: 01/16/2015 Elsevier Interactive Patient Education  2017 [employer].

## 2022-01-11 DIAGNOSIS — M7031 Other bursitis of elbow, right elbow: Secondary | ICD-10-CM | POA: Diagnosis not present

## 2022-02-14 DIAGNOSIS — M7021 Olecranon bursitis, right elbow: Secondary | ICD-10-CM | POA: Diagnosis not present

## 2022-02-14 DIAGNOSIS — Z96611 Presence of right artificial shoulder joint: Secondary | ICD-10-CM | POA: Diagnosis not present

## 2022-02-21 DIAGNOSIS — G629 Polyneuropathy, unspecified: Secondary | ICD-10-CM | POA: Diagnosis not present

## 2022-02-21 DIAGNOSIS — G2581 Restless legs syndrome: Secondary | ICD-10-CM | POA: Diagnosis not present

## 2022-02-21 DIAGNOSIS — G20 Parkinson's disease: Secondary | ICD-10-CM | POA: Diagnosis not present

## 2022-03-24 DIAGNOSIS — R200 Anesthesia of skin: Secondary | ICD-10-CM | POA: Diagnosis not present

## 2022-03-24 DIAGNOSIS — R202 Paresthesia of skin: Secondary | ICD-10-CM | POA: Diagnosis not present

## 2022-04-15 ENCOUNTER — Other Ambulatory Visit: Payer: Self-pay | Admitting: Family Medicine

## 2022-04-21 NOTE — Progress Notes (Signed)
? ? ? ?     Established Patient Office Visit ? ?Name: Stephen Stein   MRN: [id number]    DOB: 1941-07-25   Date:04/22/2022 ? ?Today's Provider: Talitha Givens, MHS, PA-C ?Introduced myself to the patient as a Journalist, newspaper and provided education on APPs in clinical practice.  ? ?      ?Subjective ? ?Chief Complaint ? ?Chief Complaint  ?Patient presents with  ? Follow-up  ? Rectal bump   ?  X2 months, feels like a  "marble", No pain, not growing.  ? ? ?HPI ? ?Parkinson's Disease: ?Currently seeing Dr. Brigitte Pulse ?Currently taking Sinemet and Ropinirole for management  ? ? ? ?HLD: Currently taking Rosuvastatin 10 mg PO QD  ?Appears to be tolerating well ?Recheck lipid panel  ? ? ? ?Depression : Currently Sertraline - states it is working fine  ?Denies concerns for mood or anxiety  ? ?Reports he falls asleep quickly at night, gets up a few times at night ?Takes a few naps during the day  ?Feels mostly well rested in the morning  ? ?Memory Concerns:  ?MMSE today for monitoring  ? ? ?Acute concern: states he feels a rectal bump about the size of a marble,  ?No pain, no changes to size that he has noticed, No bleeding from rectum  ? ? ?Patient Active Problem List  ? Diagnosis Date Noted  ? Mass of perirectal soft tissue 04/22/2022  ? Constipation 10/21/2021  ? Allergic rhinitis 10/21/2021  ? Olecranon bursitis of right elbow 09/03/2021  ? S/P reverse total shoulder arthroplasty, right 08/02/2021  ? 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[place]) 04/20/2021  ? NSVT (nonsustained ventricular tachycardia) ([place]) 10/20/2020  ? Parkinson's disease ([place]) 10/20/2020  ? Status post total shoulder replacement, left 09/11/2019  ? Carotid atherosclerosis, bilateral 02/20/2019  ? Vitamin B12 deficiency 10/15/2018  ? Memory impairment 10/12/2018  ? Hx of major depression 10/12/2018  ? Left bundle branch block 06/25/2018  ? Second degree AV block, Mobitz  type I 06/25/2018  ? Aortic calcification ([place]) 06/15/2018  ? Adrenal adenoma 05/09/2017  ? Pulmonary nodules 04/19/2017  ? BPH (benign prostatic hyperplasia) 04/19/2017  ? 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/04/2016  ? Coronary artery disease of native artery of native heart with stable angina pectoris ([place]) 03/25/2016  ? Hyperlipidemia 07/07/2015  ? Essential hypertension 07/07/2015  ? ? ?Past Surgical History:  ?Procedure Laterality Date  ? CATARACT EXTRACTION W/PHACO Left 12/09/2020  ? Procedure: CATARACT EXTRACTION PHACO AND INTRAOCULAR LENS PLACEMENT (IOC) LEFT 6.94 01:09.7 9.9% ;  Surgeon: Leandrew Koyanagi, MD;  Location: [place];  Service: Ophthalmology;  Laterality: Left;  ? CATARACT EXTRACTION W/PHACO Right 12/23/2020  ? Procedure: CATARACT EXTRACTION PHACO AND INTRAOCULAR LENS PLACEMENT ([place]) RIGHT MALYUGIN;  Surgeon: Leandrew Koyanagi, MD;  Location: [place];  Service: Ophthalmology;  Laterality: Right;  8.44 ?1:14.6 ?11.3%  ? CHOLECYSTECTOMY N/A 09/13/2018  ? Procedure: LAPAROSCOPIC CHOLECYSTECTOMY;  Surgeon: Jules Husbands, MD;  Location: [facility];  Service: General;  Laterality: N/A;  ? COLONOSCOPY  2008  ? ESOPHAGOGASTRODUODENOSCOPY (EGD) WITH PROPOFOL N/A 08/20/2018  ? Procedure: ESOPHAGOGASTRODUODENOSCOPY (EGD) WITH PROPOFOL;  Surgeon: Lucilla Lame, MD;  Location: [facility];  Service: Endoscopy;  Laterality: N/A;  ? [place]  ? TOTAL SHOULDER ARTHROPLASTY Left 09/11/2019  ? Procedure: TOTAL SHOULDER ARTHROPLASTY;  Surgeon: Lovell Sheehan, MD;  Location: [facility];  Service: Orthopedics;  Laterality: Left;  ? TOTAL SHOULDER ARTHROPLASTY Right 08/02/2021  ? Procedure: TOTAL SHOULDER ARTHROPLASTY;  Surgeon: Lovell Sheehan, MD;  Location: [facility];  Service: Orthopedics;  Laterality: Right;  ? ? ?Family History  ?Problem Relation Age of Onset  ? Cancer Mother   ?     lung  ? Heart disease Father   ? Hearing loss Sister   ?     coclear  implant  ? Hearing loss Brother   ? Cancer Paternal Grandmother   ? Cancer Son   ?     colon cancer  ? Colon cancer Neg Hx   ? Stomach cancer Neg Hx   ? ? ?Social History  ? ?Tobacco Use  ? Smoking status: Former  ?  Packs/day: 1.50  ?  Years: 35.00  ?  Pack years: 52.50  ?  Types: Cigarettes  ?  Quit date: 06/22/1992  ?  Years since quitting: 29.8  ? Smokeless tobacco: Former  ?  Quit date: 06/22/1992  ?Substance Use Topics  ? Alcohol use: Yes  ?  Alcohol/week: 1.0 standard drink  ?  Types: 1 Cans of beer per week  ?  Comment: occasional  ? ? ? ?Current Outpatient Medications:  ?  acetaminophen (TYLENOL) 500 MG tablet, Take 1,000 mg by mouth every 6 (six) hours as needed for moderate pain., Disp: , Rfl:  ?  Azelastine HCl 137 MCG/SPRAY SOLN, PLACE 2 SPRAYS INTO BOTH NOSTRILS 2 (TWO) TIMES DAILY, Disp: 2 mL, Rfl: 1 ?  carbidopa-levodopa (SINEMET IR) 25-100 MG tablet, Take 2 tablets by mouth 3 (three) times daily., Disp: , Rfl:  ?  Cholecalciferol (VITAMIN D3 PO), Take 1 tablet by mouth daily., Disp: , Rfl:  ?  docusate sodium (COLACE) 100 MG capsule, Take 1 capsule (100 mg total) by mouth 2 (two) times daily., Disp: 30 capsule, Rfl: 0 ?  glucosamine-chondroitin 500-400 MG tablet, Take 1 tablet by mouth daily., Disp: , Rfl:  ?  meloxicam (MOBIC) 15 MG tablet, Take 15 mg by mouth daily., Disp: , Rfl:  ?  Multiple Vitamin (MULTIVITAMIN WITH MINERALS) TABS tablet, Take 1 tablet by mouth daily., Disp: , Rfl:  ?  rOPINIRole (REQUIP) 1 MG tablet, Take 1 mg by mouth 4 (four) times daily., Disp: , Rfl:  ?  rosuvastatin (CRESTOR) 10 MG tablet, Take 1 tablet (10 mg total) by mouth daily., Disp: 90 tablet, Rfl: 3 ?  sertraline (ZOLOFT) 100 MG tablet, Take 100 mg by mouth daily., Disp: , Rfl:  ?  tamsulosin (FLOMAX) 0.4 MG CAPS capsule, Take 1 capsule (0.4 mg total) by mouth at bedtime., Disp: 90 capsule, Rfl: 3 ?  carbidopa-levodopa (SINEMET CR) 50-200 MG tablet, Take 1 tablet by mouth at bedtime., Disp: , Rfl:  ? ?No Known  Allergies ? ?I personally reviewed active problem list, medication list, allergies, health maintenance with the patient/caregiver today. ? ? ?Review of Systems  ?Constitutional:  Positive for malaise/fatigue (chronic). Negative for fever and weight loss.  ?HENT:  Positive for hearing loss.   ?Eyes:  Negative for blurred vision and double vision.  ?Respiratory:  Negative for cough and shortness of breath.   ?Cardiovascular:  Negative for chest pain and leg swelling.  ?Gastrointestinal:  Negative for constipation, diarrhea, nausea and vomiting.  ?Genitourinary:  Negative for dysuria, frequency and urgency.  ?Neurological:  Negative for dizziness, tingling, tremors, weakness and headaches.  ?Psychiatric/Behavioral:  Negative for depression. The patient is not nervous/anxious and does not have insomnia.   ? ? ? ?  Objective ? ?Vitals:  ? 04/22/22 0931  ?BP: 130/74  ?Pulse: 89  ?Resp: 16  ?SpO2: 95%  ?Weight: 207 lb (93.9 kg)  ?Height: '5\' 10"'$  (1.778 m)  ? ? ?Body mass index is 29.7 kg/m?. ? ?Physical Exam ?Vitals reviewed.  ?Constitutional:   ?   Appearance: Normal appearance.  ?HENT:  ?   Head: Normocephalic and atraumatic.  ?Eyes:  ?   General: Lids are normal.  ?   Conjunctiva/sclera: Conjunctivae normal.  ?   Pupils: Pupils are equal, round, and reactive to light.  ?Cardiovascular:  ?   Rate and Rhythm: Normal rate and regular rhythm.  ?   Pulses: Normal pulses.  ?   Heart sounds: Normal heart sounds.  ?Pulmonary:  ?   Effort: Pulmonary effort is normal.  ?   Breath sounds: Normal breath sounds and air entry. No decreased breath sounds, wheezing, rhonchi or rales.  ?Genitourinary: ? ? ?Musculoskeletal:  ?   Right lower leg: No edema.  ?   Left lower leg: No edema.  ?Lymphadenopathy:  ?   Head:  ?   Right side of head: No submental or submandibular adenopathy.  ?   Left side of head: No submental or submandibular adenopathy.  ?   Upper Body:  ?   Right upper body: No supraclavicular adenopathy.  ?   Left upper body: No  supraclavicular adenopathy.  ?Neurological:  ?   Mental Status: He is alert.  ?   GCS: GCS eye subscore is 4. GCS verbal subscore is 5. GCS motor subscore is 6.  ?   Motor: Motor function is intact. No weakness

## 2022-04-22 ENCOUNTER — Ambulatory Visit (INDEPENDENT_AMBULATORY_CARE_PROVIDER_SITE_OTHER): Payer: PPO | Admitting: Physician Assistant

## 2022-04-22 ENCOUNTER — Encounter: Payer: Self-pay | Admitting: Physician Assistant

## 2022-04-22 VITALS — BP 130/74 | HR 89 | Resp 16 | Ht 70.0 in | Wt 207.0 lb

## 2022-04-22 DIAGNOSIS — R413 Other amnesia: Secondary | ICD-10-CM

## 2022-04-22 DIAGNOSIS — K6289 Other specified diseases of anus and rectum: Secondary | ICD-10-CM | POA: Diagnosis not present

## 2022-04-22 DIAGNOSIS — G20 Parkinson's disease: Secondary | ICD-10-CM

## 2022-04-22 DIAGNOSIS — E782 Mixed hyperlipidemia: Secondary | ICD-10-CM | POA: Diagnosis not present

## 2022-04-22 DIAGNOSIS — F320 Major depressive disorder, single episode, mild: Secondary | ICD-10-CM

## 2022-04-22 NOTE — Assessment & Plan Note (Signed)
Chronic, ongoing ?Appears well managed with Sertraline 100 mg PO QD ?PHQ9 was 6 today ?Continue current medication ?Recommend regular monitoring considering current active problems ?Follow up in 6 months for check in or sooner for concerning changes ? ?

## 2022-04-22 NOTE — Assessment & Plan Note (Signed)
Chronic, historic condition ?Suspect this was likely due to Parkinson's Disease ?MMSE performed in office today was 28 / 30 without obvious signs of cognitive dampening during exam ?Recommend routine monitoring with PCP office and Neuro to track progress ?Follow up in 6 months for monitoring  ? ?

## 2022-04-22 NOTE — Patient Instructions (Addendum)
Please return to the office for blood work as these labs need to be performed when you are fasting ?This means:  ?No food or drink for at least 8 hours prior to blood work ?You may drink water or black coffee  ?You may take your normal medications with water prior to labs  ? ? ?I have placed  referral for you to see General Surgery ?The referral team should call you in a few days to help set that up  ?

## 2022-04-22 NOTE — Assessment & Plan Note (Signed)
New problem ?Patient reports that he noticed a small hard bump to the side of rectum ?Denies pain, swelling, bleeding, itching or changes to size, discharge or exudate ?Nodule is approx 1.5 cm in size, smooth, nontender, and firm without signs of fluctuance or induration ?Recommend hygiene of perianal area and avoid touching or manipulating area as able. ?Will refer to General Surgery for eval and potential management strategy ?Follow up as needed for coordination of care ? ?

## 2022-04-22 NOTE — Assessment & Plan Note (Addendum)
Chronic, historic condition ?Currently managed by Dr. Manuella Ghazi  ?Will defer to Neuro recommendations ?Reviewed labs from Neuro- GFR is 58 ?Follow up as needed ? ?

## 2022-04-22 NOTE — Assessment & Plan Note (Signed)
Chronic, historic condition ?Currently managed with Rosuvastatin 10 mg PO QD ?Continue current medication ?Recommend increasing exercise and following heart healthy low saturated fat diet ?Will recheck lipid panel ?Results to dictate further management  ?Follow up in 6 months ? ?

## 2022-04-25 DIAGNOSIS — E782 Mixed hyperlipidemia: Secondary | ICD-10-CM | POA: Diagnosis not present

## 2022-04-26 LAB — LIPID PANEL
Cholesterol: 128 mg/dL (ref <200)
HDL: 36 mg/dL — ABNORMAL LOW (ref >=40)
LDL Cholesterol (Calc): 71 mg/dL (calc)
Non-HDL Cholesterol (Calc): 92 mg/dL (calc) (ref <130)
Total CHOL/HDL Ratio: 3.6 (calc) (ref <5.0)
Triglycerides: 120 mg/dL (ref <150)

## 2022-04-28 ENCOUNTER — Ambulatory Visit (INDEPENDENT_AMBULATORY_CARE_PROVIDER_SITE_OTHER): Payer: PPO | Admitting: Surgery

## 2022-04-28 ENCOUNTER — Encounter: Payer: Self-pay | Admitting: Surgery

## 2022-04-28 VITALS — BP 135/80 | HR 64 | Temp 97.6°F | Ht 70.0 in | Wt 202.6 lb

## 2022-04-28 DIAGNOSIS — K629 Disease of anus and rectum, unspecified: Secondary | ICD-10-CM | POA: Diagnosis not present

## 2022-04-28 NOTE — Progress Notes (Signed)
Patient ID: Stephen Stein, male   DOB: Jul 31, 1941, 81 y.o.   MRN: [id number] ? ?Chief Complaint: Perianal lesion ? ?History of Present Illness ?Stephen Stein is a 81 y.o. male with a 35-monthhistory of a perianal lump.  Denies any pain or history of pain.  Reports there may be a slight diminishment in size after he palpated it again today.  Denies any discharge, bleeding.  Reports no difficulty with bowel habits. ? ?Past Medical History ?Past Medical History:  ?Diagnosis Date  ? Adrenal adenoma 05/09/2017  ? 2 cm on scan May 2018; refer to endo  ? Aortic atherosclerosis ([place]   ? Biliary dyskinesia   ? a. 08/2018 s/p lap chole.  ? BPH (benign prostatic hyperplasia)   ? Carotid atherosclerosis, bilateral   ? a. 01/2019 Carotid U/S: <50% bilat ICA stenosis.  ? Coronary artery calcification seen on CT scan   ? a. 11/2015 Cardiac CT: Cor Ca2+ = 892 (80th %'ile). Diffuse Ca2+ in all 3 major coronary arteries; b. 02/2016 MV: HTN response to exercise. Abnl ecg w/ horizontal inflat ST depression, but no ischemia/infarct on imaging; c. 09/2018 MV: EF 64%, no ischemia. Very sm region of mild distal antsept defect, likely 2/2 lbbb.  ? Depression   ? Grade I diastolic dysfunction   ? Hepatic steatosis   ? Hx of basal cell carcinoma 08/01/2019  ? R chest parasternal  ? Hyperlipidemia   ? Hypertension   ? Irregular heartbeat/Palpitations   ? a. Has been taking Atenolol for 20 years  ? LBBB (left bundle branch block)   ? Left ventricular hypokinesis   ? a. 09/2018 Echo: EF 45-50%, mod LVH, diff HK. Gr1 DD. Triv AI. Mildly dil Ao root. Mildly reduced RV fxn.  ? Lightheadedness   ? Mild cognitive impairment   ? NSVT (nonsustained ventricular tachycardia) ([place]   ? a. 11/2018 Zio monitor - 13 beats of NSVT--asymptomatic.  ? Orthostatic hypotension   ? a. 11/2018 pressures improved w/ midodrine/florinef.  ? [place]'s disease ([place]   ? Sinus bradycardia   ? a. 11/2018 Zio monitor: Avg HR 57 (min 28). Intermittent Mobitz I.  ?  ? ? ?Past  Surgical History:  ?Procedure Laterality Date  ? CATARACT EXTRACTION W/PHACO Left 12/09/2020  ? Procedure: CATARACT EXTRACTION PHACO AND INTRAOCULAR LENS PLACEMENT (IOC) LEFT 6.94 01:09.7 9.9% ;  Surgeon: BLeandrew Koyanagi MD;  [place]  Service: Ophthalmology;  Laterality: Left;  ? CATARACT EXTRACTION W/PHACO Right 12/23/2020  ? Procedure: CATARACT EXTRACTION PHACO AND INTRAOCULAR LENS PLACEMENT ([place] RIGHT MALYUGIN;  Surgeon: BLeandrew Koyanagi MD;  [place]  Service: Ophthalmology;  Laterality: Right;  8.44 ?1:14.6 ?11.3%  ? CHOLECYSTECTOMY N/A 09/13/2018  ? Procedure: LAPAROSCOPIC CHOLECYSTECTOMY;  Surgeon: PJules Husbands MD;  Location: [facility];  Service: General;  Laterality: N/A;  ? COLONOSCOPY  2008  ? ESOPHAGOGASTRODUODENOSCOPY (EGD) WITH PROPOFOL N/A 08/20/2018  ? Procedure: ESOPHAGOGASTRODUODENOSCOPY (EGD) WITH PROPOFOL;  Surgeon: WLucilla Lame MD;  [facility]  Service: Endoscopy;  Laterality: N/A;  ? [place] ? TOTAL SHOULDER ARTHROPLASTY Left 09/11/2019  ? Procedure: TOTAL SHOULDER ARTHROPLASTY;  Surgeon: BLovell Sheehan MD;  Location: [facility];  Service: Orthopedics;  Laterality: Left;  ? TOTAL SHOULDER ARTHROPLASTY Right 08/02/2021  ? Procedure: TOTAL SHOULDER ARTHROPLASTY;  Surgeon: BLovell Sheehan MD;  Location: [facility];  Service: Orthopedics;  Laterality: Right;  ? ? ?No Known Allergies ? ?Current Outpatient Medications  ?Medication Sig Dispense  Refill  ? acetaminophen (TYLENOL) 500 MG tablet Take 1,000 mg by mouth every 6 (six) hours as needed for moderate pain.    ? Azelastine HCl 137 MCG/SPRAY SOLN PLACE 2 SPRAYS INTO BOTH NOSTRILS 2 (TWO) TIMES DAILY 2 mL 1  ? carbidopa-levodopa (SINEMET CR) 50-200 MG tablet Take 1 tablet by mouth at bedtime.    ? carbidopa-levodopa (SINEMET IR) 25-100 MG tablet Take 2 tablets by mouth 3 (three) times daily.    ? Cholecalciferol (VITAMIN D3 PO) Take 1 tablet by mouth  daily.    ? docusate sodium (COLACE) 100 MG capsule Take 1 capsule (100 mg total) by mouth 2 (two) times daily. 30 capsule 0  ? glucosamine-chondroitin 500-400 MG tablet Take 1 tablet by mouth daily.    ? meloxicam (MOBIC) 15 MG tablet Take 15 mg by mouth daily.    ? Multiple Vitamin (MULTIVITAMIN WITH MINERALS) TABS tablet Take 1 tablet by mouth daily.    ? rOPINIRole (REQUIP) 1 MG tablet Take 1 mg by mouth 4 (four) times daily.    ? rosuvastatin (CRESTOR) 10 MG tablet Take 1 tablet (10 mg total) by mouth daily. 90 tablet 3  ? sertraline (ZOLOFT) 100 MG tablet Take 100 mg by mouth daily.    ? tamsulosin (FLOMAX) 0.4 MG CAPS capsule Take 1 capsule (0.4 mg total) by mouth at bedtime. 90 capsule 3  ? ?No current facility-administered medications for this visit.  ? ? ?Family History ?Family History  ?Problem Relation Age of Onset  ? Cancer Mother   ?     lung  ? Heart disease Father   ? Hearing loss Sister   ?     coclear implant  ? Hearing loss Brother   ? Cancer Paternal Grandmother   ? Cancer Son   ?     colon cancer  ? Colon cancer Neg Hx   ? Stomach cancer Neg Hx   ?  ? ? ?Social History ?Social History  ? ?Tobacco Use  ? Smoking status: Former  ?  Packs/day: 1.50  ?  Years: 35.00  ?  Pack years: 52.50  ?  Types: Cigarettes  ?  Quit date: 06/22/1992  ?  Years since quitting: 29.8  ? Smokeless tobacco: Former  ?  Quit date: 06/22/1992  ?Vaping Use  ? Vaping Use: Never used  ?Substance Use Topics  ? Alcohol use: Yes  ?  Alcohol/week: 1.0 standard drink  ?  Types: 1 Cans of beer per week  ?  Comment: occasional  ? Drug use: No  ?  ?  ? ? ?Review of Systems  ?Constitutional: Negative.   ?HENT:  Positive for hearing loss.   ?Eyes: Negative.   ?Respiratory:  Positive for sputum production.   ?Cardiovascular: Negative.   ?Gastrointestinal: Negative.   ?Genitourinary:  Positive for dysuria.  ?Skin: Negative.   ?Neurological:  Positive for tremors (Parkinson's).  ?Psychiatric/Behavioral: Negative.    ?  ? ?Physical  Exam ?Blood pressure 135/80, pulse 64, temperature 97.6 ?F (36.4 ?C), temperature source Oral, height '5\' 10"'$  (1.778 m), weight 202 lb 9.6 oz (91.9 kg), SpO2 97 %. ?Last Weight  Most recent update: 04/28/2022 11:01 AM  ? ? Weight  ?91.9 kg (202 lb 9.6 oz)  ?      ? ?  ? ? ?CONSTITUTIONAL: Well developed, and nourished, appropriately responsive and aware without distress.   ?EYES: Sclera non-icteric.   ?EARS, NOSE, MOUTH AND THROAT:  The oropharynx is clear. Oral mucosa is  pink and moist.   Hearing is intact to voice.  ?NECK: Trachea is midline, and there is no jugular venous distension.  ?LYMPH NODES:  Lymph nodes in the neck are not enlarged. ?RESPIRATORY:  Lungs are clear, and breath sounds are equal bilaterally. Normal respiratory effort without pathologic use of accessory muscles. ?CARDIOVASCULAR: Heart is regular in rate and rhythm. ?GI: The abdomen is soft, nontender, and nondistended. There were no palpable masses. I did not appreciate hepatosplenomegaly. There were normal bowel sounds. ?GU: There is a well-defined nodule about 1 cm in diameter at the patient's lateral right 2:00.  Discrete, well-defined, consistent with either a benign cyst or possible scarred thrombosed external hemorrhoid.  Nontender, no surrounding induration, immediately upon the anal verge consistent with hemorrhoid.  Does not involve the underlying sphincteric muscle.  Very mobile. ?MUSCULOSKELETAL:  Symmetrical muscle tone appreciated in all four extremities.    ?SKIN: Skin turgor is normal. No pathologic skin lesions appreciated.  ?NEUROLOGIC:  Motor and sensation appear grossly normal.  Cranial nerves are grossly without defect. ?PSYCH:  Alert and oriented to person, place and time. Affect is appropriate for situation. ? ?Data Reviewed ?I have personally reviewed what is currently available of the patient's imaging, recent labs and medical records.   ?Labs:  ? ?  Latest Ref Rng & Units 07/27/2021  ? 11:13 AM 04/20/2021  ? 11:55 AM  10/20/2020  ? 11:12 AM  ?CBC  ?WBC 4.0 - 10.5 K/uL 7.7   6.9   7.6    ?Hemoglobin 13.0 - 17.0 g/dL 14.1   13.6   13.8    ?Hematocrit 39.0 - 52.0 % 41.1   40.9   40.9    ?Platelets 150 - 400 K/uL 224   218   216    ? ?

## 2022-04-28 NOTE — Patient Instructions (Signed)
If you have any concerns or questions, please feel free to call our office. Follow up in 2-3 months to re-evaluate.  ?

## 2022-05-04 DIAGNOSIS — Z87891 Personal history of nicotine dependence: Secondary | ICD-10-CM | POA: Diagnosis not present

## 2022-05-04 DIAGNOSIS — S51802A Unspecified open wound of left forearm, initial encounter: Secondary | ICD-10-CM | POA: Diagnosis not present

## 2022-05-04 DIAGNOSIS — W19XXXA Unspecified fall, initial encounter: Secondary | ICD-10-CM | POA: Diagnosis not present

## 2022-05-04 DIAGNOSIS — S41102A Unspecified open wound of left upper arm, initial encounter: Secondary | ICD-10-CM | POA: Diagnosis not present

## 2022-05-04 DIAGNOSIS — S2242XA Multiple fractures of ribs, left side, initial encounter for closed fracture: Secondary | ICD-10-CM | POA: Diagnosis not present

## 2022-05-06 ENCOUNTER — Ambulatory Visit: Payer: Self-pay | Admitting: *Deleted

## 2022-05-06 NOTE — Telephone Encounter (Signed)
Pt.notified
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
## 2022-05-09 DIAGNOSIS — M79602 Pain in left arm: Secondary | ICD-10-CM | POA: Diagnosis not present

## 2022-05-10 ENCOUNTER — Telehealth: Payer: Self-pay

## 2022-05-10 ENCOUNTER — Encounter: Payer: Self-pay | Admitting: Family Medicine

## 2022-05-10 ENCOUNTER — Other Ambulatory Visit: Payer: Self-pay | Admitting: Family Medicine

## 2022-05-10 ENCOUNTER — Telehealth (INDEPENDENT_AMBULATORY_CARE_PROVIDER_SITE_OTHER): Payer: PPO | Admitting: Family Medicine

## 2022-05-10 VITALS — Ht 70.0 in | Wt 205.0 lb

## 2022-05-10 DIAGNOSIS — S2242XA Multiple fractures of ribs, left side, initial encounter for closed fracture: Secondary | ICD-10-CM

## 2022-05-10 DIAGNOSIS — R399 Unspecified symptoms and signs involving the genitourinary system: Secondary | ICD-10-CM

## 2022-05-10 DIAGNOSIS — S40812A Abrasion of left upper arm, initial encounter: Secondary | ICD-10-CM

## 2022-05-10 NOTE — Progress Notes (Signed)
? ?Name: Stephen Stein   MRN: [id number]    DOB: 1941-08-05   Date:05/10/2022 ? ?     Progress Note ? ?Subjective:  ? ? ?Chief Complaint ? ?Chief Complaint  ?Patient presents with  ? Fall  ? ? ?I connected with  [name] on 05/10/22 at 11:40 AM EDT by telephone and verified that I am speaking with the correct person using two identifiers. ?  ?I discussed the limitations, risks, security and privacy concerns of performing an evaluation and management service by telephone and the availability of in person appointments. Staff also discussed with the patient that there may be a patient responsible charge related to this service.  Patient verbalized understanding and agreed to proceed with encounter. ?Patient Location: home ?Provider Location: [place]  ?Additional Individuals present:  ? ?HPI ?Three fractured ribs left side, very painful [place], he is using incentive spirometer 5-6 x a day, still hurts to breath, not much improvement in the past week  ?No cough, sweats, SOB, fatigue ?Abrasions on left arm have been looked at by [facility] - doing wound care and they will recheck him next week.  ? ? ? ?Patient Active Problem List  ? Diagnosis Date Noted  ? Anal lesion 04/28/2022  ? Constipation 10/21/2021  ? Allergic rhinitis 10/21/2021  ? Olecranon bursitis of right elbow 09/03/2021  ? S/P reverse total shoulder arthroplasty, right 08/02/2021  ? 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[place]) 04/20/2021  ? NSVT (nonsustained ventricular tachycardia) ([place]) 10/20/2020  ? Parkinson's disease ([place]) 10/20/2020  ? Status post total shoulder replacement, left 09/11/2019  ? Carotid atherosclerosis, bilateral 02/20/2019  ? Vitamin B12 deficiency 10/15/2018  ? Memory impairment 10/12/2018  ? Hx of major depression 10/12/2018  ? Left bundle branch block 06/25/2018  ? Second degree AV block, Mobitz type I  06/25/2018  ? Aortic calcification ([place]) 06/15/2018  ? Adrenal adenoma 05/09/2017  ? Pulmonary nodules 04/19/2017  ? BPH (benign prostatic hyperplasia) 04/19/2017  ? 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/04/2016  ? Coronary artery disease of native artery of native heart with stable angina pectoris ([place]) 03/25/2016  ? Hyperlipidemia 07/07/2015  ? Essential hypertension 07/07/2015  ? ? ?Social History  ? ?Tobacco Use  ? Smoking status: Former  ?  Packs/day: 1.50  ?  Years: 35.00  ?  Pack years: 52.50  ?  Types: Cigarettes  ?  Quit date: 06/22/1992  ?  Years since quitting: 29.9  ? Smokeless tobacco: Former  ?  Quit date: 06/22/1992  ?Substance Use Topics  ? Alcohol use: Yes  ?  Alcohol/week: 1.0 standard drink  ?  Types: 1 Cans of beer per week  ?  Comment: occasional  ? ? ? ?Current Outpatient Medications:  ?  acetaminophen (TYLENOL) 500 MG tablet, Take 1,000 mg by mouth every 6 (six) hours as needed for moderate pain., Disp: , Rfl:  ?  Azelastine HCl 137 MCG/SPRAY SOLN, PLACE 2 SPRAYS INTO BOTH NOSTRILS 2 (TWO) TIMES DAILY, Disp: 30 mL, Rfl: 3 ?  carbidopa-levodopa (SINEMET IR) 25-100 MG tablet, Take 2 tablets by mouth 3 (three) times daily., Disp: , Rfl:  ?  Cholecalciferol (VITAMIN D3 PO), Take 1 tablet by mouth daily., Disp: , Rfl:  ?  docusate sodium (COLACE) 100 MG capsule, Take 1 capsule (100 mg total) by mouth 2 (two) times daily., Disp: 30 capsule, Rfl: 0 ?  glucosamine-chondroitin 500-400 MG tablet, Take 1 tablet by mouth daily., Disp: , Rfl:  ?  meloxicam (MOBIC) 15 MG tablet, Take 15 mg by mouth daily., Disp: , Rfl:  ?  Multiple Vitamin (MULTIVITAMIN WITH MINERALS) TABS tablet, Take 1 tablet by mouth daily., Disp: , Rfl:  ?  rOPINIRole (REQUIP) 1 MG tablet, Take 1 mg by mouth 4 (four) times daily., Disp: , Rfl:  ?  rosuvastatin (CRESTOR) 10 MG tablet, Take 1 tablet (10 mg total) by mouth daily., Disp: 90 tablet, Rfl: 3 ?  sertraline (ZOLOFT) 100 MG tablet, Take 100 mg by mouth daily., Disp: , Rfl:  ?   tamsulosin (FLOMAX) 0.4 MG CAPS capsule, TAKE 1 CAPSULE BY MOUTH EVERYDAY AT BEDTIME, Disp: 90 capsule, Rfl: 3 ?  carbidopa-levodopa (SINEMET CR) 50-200 MG tablet, Take 1 tablet by mouth at bedtime., Disp: , Rfl:  ? ?No Known Allergies ? ?Chart Review: ?I personally reviewed active problem list, medication list, allergies, family history, social history, health maintenance, notes from last encounter, lab results, imaging with the patient/caregiver today. ? ? ?Review of Systems  ?Constitutional: Negative.   ?HENT: Negative.    ?Eyes: Negative.   ?Respiratory: Negative.    ?Cardiovascular: Negative.   ?Gastrointestinal: Negative.   ?Endocrine: Negative.   ?Genitourinary: Negative.   ?Musculoskeletal: Negative.   ?Skin: Negative.   ?Allergic/Immunologic: Negative.   ?Neurological: Negative.   ?Hematological: Negative.   ?Psychiatric/Behavioral: Negative.    ?All other systems reviewed and are negative. ? ? ?Objective:  ? ? ?Virtual encounter, vitals limited, only able to obtain the following ?Today's Vitals  ? 05/10/22 1308  ?Weight: 205 lb (93 kg)  ?Height: '5\' 10"'$  (1.778 m)  ? ?Body mass index is 29.41 kg/m?[name] ?Nursing Note and Vital Signs reviewed. ? ?Physical Exam ?Vitals and nursing note reviewed.  ?Pulmonary:  ?   Effort: Pulmonary effort is normal.  ?Neurological:  ?   Mental Status: He is alert.  ?Psychiatric:     ?   Mood and Affect: Mood normal.  ? ? ?PE limited by telephone encounter ? ?No results found for this or any previous visit (from the past 72 hour(s)). ? ?Assessment and Plan:  ? ?1. Closed fracture of multiple ribs of left side, initial encounter ?Encouraged to continue using incentive spirometry managed pain with binders and over-the-counter medications, topical cold therapy ?Reviewed concerning signs and symptoms for pneumonia patient verbalizes understanding if he has any symptoms he is instructed to get a chest x-ray and follow-up with our office right away ?- DG Chest 2 View; Future ? ?2.  Abrasion of left upper extremity, initial encounter ?Extensive abrasions to left arm which is being managed by [facility] ? ? ?-Red flags and when to present for emergency care or RTC including but not limited to new/worsening/un-resolving symptoms,  reviewed with patient at time of visit. Follow up and care instructions discussed and provided in AVS. ?- I discussed the assessment and treatment plan with the patient. The patient was provided an opportunity to ask questions and all were answered. The patient agreed with the plan and demonstrated an understanding of the instructions. ? - The patient was advised to call back or seek an in-person evaluation if the symptoms worsen or if the condition fails to improve as anticipated. ? ?I provided 15 minutes of non-face-to-face time during this encounter. ? ?Delsa Grana, PA-C ?05/10/22 2:07 PM ? ?

## 2022-05-10 NOTE — Telephone Encounter (Signed)
Copied from [place] [phone]. Topic: General - Other ?>> May 10, 2022 12:07 PM Valere Dross wrote: ?Reason for CRM: Pt called in stating he missed his phone appt today and was calling back, pt states that the nurse was supposed to call back, not PCP, but requesting a call back anyway's, please advise. ? ? ?Spoke with patient and triaged him. ?

## 2022-05-20 DIAGNOSIS — M79602 Pain in left arm: Secondary | ICD-10-CM | POA: Diagnosis not present

## 2022-06-12 NOTE — Progress Notes (Unsigned)
Date:  06/13/2022   ID:  Stephen Stein, DOB [birth year], MRN [id number]  Patient Location:  [place] [address]   Provider location:   [facility], [place] office  PCP:  Delsa Grana, PA-C  Cardiologist:  Patsy Baltimore  Chief Complaint  Patient presents with   6 month follow up     "Doing well." Medications reviewed by the patient verbally.      History of Present Illness:    Stephen Stein is a 81 y.o. male with  past medical history of smoking history, stopped 20 years ago,  hyperlipidemia,  palpitations, treated with atenolol,  CT coronary calcium score with score  800,  Minor carotid intimal thickening and atherosclerosis. stress testing showing no significant ischemia 10/19 Parkinson's who presents for routine follow-up Of his coronary artery disease   LOV 03/2021 On that visit noted to have 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 Caryl Comes July 2022 As it was determined he was asymptomatic, decision made for no pacer at that time Orthostatics were negative, treadmill study performed: improved condution  Suffered a fall May 2023 Fractured several ribs on the left Was periodically using meloxicam napping twice a day Wakes 3-4 times a day, bladder, sleeping in recliner since the ribs  Reports having fatigue, no get up and go, chronic issue no recent change No regular exercise program, sedentary at baseline Denies PND orthopnea no leg edema  Lab work reviewed Total cholesterol 128 LDL 71 Creatinine 1.2 BUN 19 Hemoglobin 14  EKG personally reviewed by myself on todays visit Atrial for flutter rate 44 bpm right bundle branch block  Past medical history reviewed Reports prior diagnosis of Parkinson's Chronic dizziness, gait instability  CT coronary calcium score, 800 He has three-vessel coronary artery disease, no significant aortic atherosclerosis   Previous stress test  showing no significant ischemia.  Echocardiogram October 09, 2018 Ejection fraction 45 to 50%, moderate LVH   Stress test October 17, 2018 No significant ischemia, fraction estimated 65% Left bundle branch block  CT coronary calcium score, 800 He has three-vessel coronary artery disease, no significant aortic atherosclerosis   Past Medical History:  Diagnosis Date   Adrenal adenoma 05/09/2017   2 cm on scan May 2018; refer to endo   Aortic atherosclerosis ([place])    Biliary dyskinesia    a. 08/2018 s/p lap chole.   BPH (benign prostatic hyperplasia)    Carotid atherosclerosis, bilateral    a. 01/2019 Carotid U/S: <50% bilat ICA stenosis.   Coronary artery calcification seen on CT scan    a. 11/2015 Cardiac CT: Cor Ca2+ = 892 (80th %'ile). Diffuse Ca2+ in all 3 major coronary arteries; b. 02/2016 MV: HTN response to exercise. Abnl ecg w/ horizontal inflat ST depression, but no ischemia/infarct on imaging; c. 09/2018 MV: EF 64%, no ischemia. Very sm region of mild distal antsept defect, likely 2/2 lbbb.   Depression    Grade I diastolic dysfunction    Hepatic steatosis    Hx of basal cell carcinoma 08/01/2019   R chest parasternal   Hyperlipidemia    Hypertension    Irregular heartbeat/Palpitations    a. Has been taking Atenolol for 20 years   LBBB (left bundle branch block)    Left ventricular hypokinesis    a. 09/2018 Echo: EF 45-50%, mod LVH, diff HK. Gr1 DD. Triv AI. Mildly dil Ao root. Mildly reduced RV fxn.  Lightheadedness    Mild cognitive impairment    NSVT (nonsustained ventricular tachycardia) ([place])    a. 11/2018 Zio monitor - 13 beats of NSVT--asymptomatic.   Orthostatic hypotension    a. 11/2018 pressures improved w/ midodrine/florinef.   Parkinson's disease ([name])    Sinus bradycardia    a. 11/2018 Zio monitor: Avg HR 57 (min 28). Intermittent Mobitz I.   Past Surgical History:  Procedure Laterality Date   CATARACT EXTRACTION W/PHACO Left 12/09/2020    Procedure: CATARACT EXTRACTION PHACO AND INTRAOCULAR LENS PLACEMENT (IOC) LEFT 6.94 01:09.7 9.9% ;  Surgeon: Leandrew Koyanagi, MD;  Location: [facility];  Service: Ophthalmology;  Laterality: Left;   CATARACT EXTRACTION W/PHACO Right 12/23/2020   Procedure: CATARACT EXTRACTION PHACO AND INTRAOCULAR LENS PLACEMENT ([place]) RIGHT MALYUGIN;  Surgeon: Leandrew Koyanagi, MD;  Location: [facility];  Service: Ophthalmology;  Laterality: Right;  8.44 1:14.6 11.3%   CHOLECYSTECTOMY N/A 09/13/2018   Procedure: LAPAROSCOPIC CHOLECYSTECTOMY;  Surgeon: Jules Husbands, MD;  Location: [facility];  Service: General;  Laterality: N/A;   COLONOSCOPY  2008   ESOPHAGOGASTRODUODENOSCOPY (EGD) WITH PROPOFOL N/A 08/20/2018   Procedure: ESOPHAGOGASTRODUODENOSCOPY (EGD) WITH PROPOFOL;  Surgeon: Lucilla Lame, MD;  Location: [facility];  Service: Endoscopy;  Laterality: N/A;   TONSILLECTOMY AND ADENOIDECTOMY  1955   TOTAL SHOULDER ARTHROPLASTY Left 09/11/2019   Procedure: TOTAL SHOULDER ARTHROPLASTY;  Surgeon: Lovell Sheehan, MD;  Location: [facility];  Service: Orthopedics;  Laterality: Left;   TOTAL SHOULDER ARTHROPLASTY Right 08/02/2021   Procedure: TOTAL SHOULDER ARTHROPLASTY;  Surgeon: Lovell Sheehan, MD;  Location: [facility];  Service: Orthopedics;  Laterality: Right;     Current Meds  Medication Sig   acetaminophen (TYLENOL) 500 MG tablet Take 1,000 mg by mouth every 6 (six) hours as needed for moderate pain.   Azelastine HCl 137 MCG/SPRAY SOLN PLACE 2 SPRAYS INTO BOTH NOSTRILS 2 (TWO) TIMES DAILY   carbidopa-levodopa (SINEMET CR) 50-200 MG tablet Take 1 tablet by mouth at bedtime.   carbidopa-levodopa (SINEMET IR) 25-100 MG tablet Take 2 tablets by mouth 3 (three) times daily.   Cholecalciferol (VITAMIN D3 PO) Take 1 tablet by mouth daily.   docusate sodium (COLACE) 100 MG capsule Take 1 capsule (100 mg total) by mouth 2 (two) times daily.   meloxicam (MOBIC) 15 MG tablet Take 15 mg by mouth  daily.   rOPINIRole (REQUIP) 1 MG tablet Take 1 mg by mouth 4 (four) times daily.   rosuvastatin (CRESTOR) 10 MG tablet Take 1 tablet (10 mg total) by mouth daily.   sertraline (ZOLOFT) 100 MG tablet Take 100 mg by mouth daily.   tamsulosin (FLOMAX) 0.4 MG CAPS capsule TAKE 1 CAPSULE BY MOUTH EVERYDAY AT BEDTIME     Allergies:   Patient has no known allergies.   Social History   Tobacco Use   Smoking status: Former    Packs/day: 1.50    Years: 35.00    Total pack years: 52.50    Types: Cigarettes    Quit date: 06/22/1992    Years since quitting: 29.9   Smokeless tobacco: Former    Quit date: 06/22/1992  Vaping Use   Vaping Use: Never used  Substance Use Topics   Alcohol use: Yes    Alcohol/week: 1.0 standard drink of alcohol    Types: 1 Cans of beer per week    Comment: occasional   Drug use: No    Family Hx: The patient's family history includes Cancer in his mother, paternal grandmother,  and son; Hearing loss in his brother and sister; Heart disease in his father. There is no history of Colon cancer or Stomach cancer.  ROS:   Please see the history of present illness.    Review of Systems  Constitutional: Negative.   Respiratory: Negative.    Cardiovascular: Negative.   Gastrointestinal: Negative.   Musculoskeletal: Negative.   Neurological:  Positive for dizziness.  Psychiatric/Behavioral: Negative.    All other systems reviewed and are negative.    Labs/Other Tests and Data Reviewed:    Recent Labs: 07/27/2021: BUN 18; Creatinine, Ser 0.99; Hemoglobin 14.1; Platelets 224; Potassium 3.9; Sodium 138   Recent Lipid Panel Lab Results  Component Value Date/Time   CHOL 128 04/25/2022 08:06 AM   CHOL 110 06/01/2016 08:40 AM   TRIG 120 04/25/2022 08:06 AM   HDL 36 (L) 04/25/2022 08:06 AM   HDL 39 (L) 06/01/2016 08:40 AM   CHOLHDL 3.6 04/25/2022 08:06 AM   LDLCALC 71 04/25/2022 08:06 AM    Wt Readings from Last 3 Encounters:  06/13/22 207 lb 4 oz (94 kg)   05/10/22 205 lb (93 kg)  04/28/22 202 lb 9.6 oz (91.9 kg)     Exam:   BP (!) 110/50 (BP Location: Left Arm, Patient Position: Sitting, Cuff Size: Normal)   Pulse (!) 44   Ht '5\' 10"'$  (1.778 m)   Wt 207 lb 4 oz (94 kg)   SpO2 97%   BMI 29.74 kg/m  Constitutional:  oriented to person, place, and time. No distress.  HENT:  Head: Grossly normal Eyes:  no discharge. No scleral icterus.  Neck: No JVD, no carotid bruits  Cardiovascular: Regular rate and rhythm, no murmurs appreciated Pulmonary/Chest: Clear to auscultation bilaterally, no wheezes or rails Abdominal: Soft.  no distension.  no tenderness.  Musculoskeletal: Normal range of motion Neurological:  normal muscle tone. Coordination normal. No atrophy Skin: Skin warm and dry Psychiatric: normal affect, pleasant  ASSESSMENT & PLAN:    sick sinus syndrome/new onset atrial fibrillation/flutter Chronic fatigue, sedentary, deconditioned Difficult to determine onset of arrhythmia Ventricular rates remain low in the 40s Details discussed with him, we will start Eliquis 5 twice daily Repeat echocardiogram ordered to determine if any change in ejection fraction and atrial fibrillation We will arrange follow-up with Dr. Caryl Comes to determine next course of action, whether rate control versus attempt at cardioversion and restoring normal sinus rhythm would be beneficial  Mixed hyperlipidemia - Plan: EKG 12-Lead Cholesterol is at goal on the current lipid regimen. No changes to the medications were made.  Coronary artery disease involving native coronary artery of native heart without angina pectoris - Plan: EKG 12-Lead Prior stress test with no significant ischemia Anginal symptoms but sedentary at baseline   Dizziness Neurologic issue, previously treated with sertraline and clonazepam Notes from neurology indicating cognitive issues, Parkinson's Denies any recent near-syncope or syncope    Total encounter time more than 40  minutes  Greater than 50% was spent in counseling and coordination of care with the patient   Signed, Ida Rogue, MD  06/13/2022 8:35 AM    [place] Office [address], [place], [place] [address]

## 2022-06-13 ENCOUNTER — Encounter: Payer: Self-pay | Admitting: Cardiovascular Disease

## 2022-06-13 ENCOUNTER — Ambulatory Visit: Payer: PPO | Admitting: Cardiovascular Disease

## 2022-06-13 VITALS — BP 110/50 | HR 44 | Ht 70.0 in | Wt 207.2 lb

## 2022-06-13 DIAGNOSIS — I25118 Atherosclerotic heart disease of native coronary artery with other forms of angina pectoris: Secondary | ICD-10-CM | POA: Diagnosis not present

## 2022-06-13 DIAGNOSIS — I4891 Unspecified atrial fibrillation: Secondary | ICD-10-CM | POA: Diagnosis not present

## 2022-06-13 DIAGNOSIS — I10 Essential (primary) hypertension: Secondary | ICD-10-CM | POA: Diagnosis not present

## 2022-06-13 DIAGNOSIS — E782 Mixed hyperlipidemia: Secondary | ICD-10-CM | POA: Diagnosis not present

## 2022-06-13 MED ORDER — APIXABAN 5 MG PO TABS: 5.0000 mg | ORAL_TABLET | Freq: Two times a day (BID) | ORAL | 3 refills | Status: DC

## 2022-06-13 NOTE — Patient Instructions (Addendum)
Medication Instructions:  Please start eliquis 5 mg twice a day  If you need a refill on your cardiac medications before your next appointment, please call your pharmacy.   Lab work: No new labs needed  Testing/Procedures:  Your physician has requested that you have an echocardiogram. Echocardiography is a painless test that uses sound waves to create images of your heart. It provides your doctor with information about the size and shape of your heart and how well your heart's chambers and valves are working. This procedure takes approximately one hour. There are no restrictions for this procedure.   Follow-Up: At [facility], you and your health needs are our priority.  As part of our continuing mission to provide you with exceptional heart care, we have created designated Provider Care Teams.  These Care Teams include your primary Cardiologist (physician) and Advanced Practice Providers (APPs -  Physician Assistants and Nurse Practitioners) who all work together to provide you with the care you need, when you need it.  You will need a follow up appointment with Dr. Caryl Comes next available, and with Dr. Rockey Situ in 6 months  Providers on your designated Care Team:   Murray Hodgkins, NP Christell Faith, PA-C Cadence Kathlen Mody, [place]  COVID-19 Vaccine Information can be found at: [email or web address].uk For questions related to vaccine distribution or appointments, please email [email or web address] or call 4433255792.

## 2022-06-21 DIAGNOSIS — G2581 Restless legs syndrome: Secondary | ICD-10-CM | POA: Diagnosis not present

## 2022-06-21 DIAGNOSIS — G629 Polyneuropathy, unspecified: Secondary | ICD-10-CM | POA: Diagnosis not present

## 2022-06-21 DIAGNOSIS — R4189 Other symptoms and signs involving cognitive functions and awareness: Secondary | ICD-10-CM | POA: Diagnosis not present

## 2022-06-21 DIAGNOSIS — G20 Parkinson's disease: Secondary | ICD-10-CM | POA: Diagnosis not present

## 2022-06-22 DIAGNOSIS — Z7901 Long term (current) use of anticoagulants: Secondary | ICD-10-CM | POA: Diagnosis not present

## 2022-06-22 DIAGNOSIS — F3342 Major depressive disorder, recurrent, in full remission: Secondary | ICD-10-CM | POA: Diagnosis not present

## 2022-06-22 DIAGNOSIS — I4819 Other persistent atrial fibrillation: Secondary | ICD-10-CM | POA: Diagnosis not present

## 2022-07-12 ENCOUNTER — Other Ambulatory Visit: Payer: PPO

## 2022-07-15 ENCOUNTER — Ambulatory Visit (INDEPENDENT_AMBULATORY_CARE_PROVIDER_SITE_OTHER): Payer: PPO

## 2022-07-15 DIAGNOSIS — I4891 Unspecified atrial fibrillation: Secondary | ICD-10-CM | POA: Diagnosis not present

## 2022-07-15 LAB — ECHOCARDIOGRAM COMPLETE
AR max vel: 3.69 cm2
AV Area VTI: 2.73 cm2
AV Area mean vel: 3.45 cm2
AV Mean grad: 2 mmHg
AV Peak grad: 4 mmHg
Ao pk vel: 1 m/s
Area-P 1/2: 3.72 cm2
S' Lateral: 3.1 cm

## 2022-07-21 ENCOUNTER — Ambulatory Visit: Payer: PPO | Admitting: Internal Medicine

## 2022-08-30 DIAGNOSIS — S82842A Displaced bimalleolar fracture of left lower leg, initial encounter for closed fracture: Secondary | ICD-10-CM | POA: Diagnosis not present

## 2022-08-31 DIAGNOSIS — S82845A Nondisplaced bimalleolar fracture of left lower leg, initial encounter for closed fracture: Secondary | ICD-10-CM | POA: Diagnosis not present

## 2022-09-01 ENCOUNTER — Telehealth: Payer: Self-pay | Admitting: Cardiovascular Disease

## 2022-09-01 DIAGNOSIS — S82845A Nondisplaced bimalleolar fracture of left lower leg, initial encounter for closed fracture: Secondary | ICD-10-CM | POA: Diagnosis not present

## 2022-09-01 NOTE — Telephone Encounter (Signed)
Patient wife came by office States patient broke both ankles and they would like to know what to do with eliquis Informed if this has to do with the orthopedic surgeon we would need a clearance but she states it is just for personal info at the moment Please call to discuss

## 2022-09-02 ENCOUNTER — Encounter: Payer: Self-pay | Admitting: Cardiovascular Disease

## 2022-09-05 DIAGNOSIS — Z7901 Long term (current) use of anticoagulants: Secondary | ICD-10-CM | POA: Diagnosis not present

## 2022-09-05 DIAGNOSIS — I4891 Unspecified atrial fibrillation: Secondary | ICD-10-CM | POA: Diagnosis not present

## 2022-09-05 DIAGNOSIS — S82842A Displaced bimalleolar fracture of left lower leg, initial encounter for closed fracture: Secondary | ICD-10-CM | POA: Diagnosis not present

## 2022-09-05 DIAGNOSIS — S90522A Blister (nonthermal), left ankle, initial encounter: Secondary | ICD-10-CM | POA: Diagnosis not present

## 2022-09-05 DIAGNOSIS — S90529A Blister (nonthermal), unspecified ankle, initial encounter: Secondary | ICD-10-CM | POA: Insufficient documentation

## 2022-09-05 NOTE — Telephone Encounter (Signed)
Stephen Merritts, MD  Sent: Sun September 04, 2022  2:03 PM  To: [facility] Triage          Message  Would recommend staying on Eliquis  If surgery is needed, Eliquis may need to be held but we can work with orthopedics on stopping and starting  Thx  TGollan

## 2022-09-05 NOTE — Telephone Encounter (Signed)
Attempted to call [name] (ok per [facility]). No answer- I left a message to please call back.

## 2022-09-06 ENCOUNTER — Telehealth: Payer: Self-pay | Admitting: Cardiovascular Disease

## 2022-09-06 NOTE — Telephone Encounter (Signed)
Attempted to call [phone] with Dr. Donivan Scull recommendations. No answer- I left a message to please call back.

## 2022-09-06 NOTE — Telephone Encounter (Signed)
Attempted to call [phone] with Dr. Donivan Scull recommendations. Left detailed message and asked for a return call.

## 2022-09-06 NOTE — Telephone Encounter (Signed)
Wife returned RN's call. 

## 2022-09-06 NOTE — Telephone Encounter (Signed)
Returned call to Stephen Stein (pt's wife on [place]) and she stated that she stopped her husband's Eliquis on 08/28/22 due to bad bruising on L hand. Originally intended to stop for 48 hours only, however, he had a fall and broke his L ankle in 2 places. Went to [facility]) and was advised to NOT go back on Eliquis until speaking with cardiology. At last return visit with orthopedic surgeon on 9/11, he had cast removed and new one applied and there was more swelling than should be, accompanied by 'some bleeding" and orthopedic advised that he should not take the Eliquis as it would worsen pt's symptoms. Pt has now been off of Eliquis for 9 days and they are seeking advice on what to do from Dr Rockey Situ. States surgery is tentatively scheduled for 9/27 or 9/29 based on swelling. No clearance form will be started because pt has been off, so wants final advice from MD on resuming/holding Eliquis. Orthopedic MD is Viens and office number is [phone].  Best contact number to reach patient or Stephen Stein at is [phone].

## 2022-09-06 NOTE — Telephone Encounter (Signed)
Minna Merritts, MD  Sent: Tue September 06, 2022  2:54 PM  To: Emily Filbert, RN          Message  Difficult to say what to do about the Eliquis  Would defer to what ever orthopedics wants to do as we are unable to see his legs, do not know what kind of trauma he has had.  In an ideal situation would go back on Eliquis but when orthopedics is recommending to hold the Eliquis would probably hold it as they are able to see his legs and bruising, hematoma  So likely will need to hold it until after surgery, restart when orthopedics says it is okay to restart  Thx  TGollan

## 2022-09-09 NOTE — Telephone Encounter (Signed)
I spoke with patients wife, nancy,  and relayed Dr.Gollan's advice to hold Eliquis and defer to orthopedics. Wife agrees with this plan.

## 2022-09-13 DIAGNOSIS — S90522A Blister (nonthermal), left ankle, initial encounter: Secondary | ICD-10-CM | POA: Diagnosis not present

## 2022-09-13 DIAGNOSIS — S82842A Displaced bimalleolar fracture of left lower leg, initial encounter for closed fracture: Secondary | ICD-10-CM | POA: Diagnosis not present

## 2022-09-14 ENCOUNTER — Ambulatory Visit: Payer: PPO | Admitting: Dermatology

## 2022-09-19 DIAGNOSIS — S82842A Displaced bimalleolar fracture of left lower leg, initial encounter for closed fracture: Secondary | ICD-10-CM | POA: Diagnosis not present

## 2022-09-19 DIAGNOSIS — S90522A Blister (nonthermal), left ankle, initial encounter: Secondary | ICD-10-CM | POA: Diagnosis not present

## 2022-09-21 ENCOUNTER — Telehealth: Payer: Self-pay | Admitting: Cardiovascular Disease

## 2022-09-21 NOTE — Telephone Encounter (Signed)
   Pre-operative Risk Assessment    Patient Name: TJ KITCHINGS  DOB: Feb 22, 1941 MRN: [id number]{      Request for Surgical Clearance    Procedure:   LEFT ORIF BIMALLEOLAR FRACTURE  Date of Surgery:  Clearance 09/28/22                                Surgeon:  DR Diona Browner Surgeon's Group or Practice Name:  [facility] Phone number:  [phone] Fax number:  [phone]  Type of Clearance Requested:   - Pharmacy:  Hold Apixaban (Eliquis)  APRIRIN  3 DAYS PREOP  Type of Anesthesia:  Not Indicated  Additional requests/questions:    Signed, Eli Phillips   09/21/2022, 3:33 PM

## 2022-09-22 NOTE — Telephone Encounter (Signed)
Patient with diagnosis of atrial fibrillation on Eliquis for anticoagulation.    Procedure: Left ORIF bimalleolar fracture Date of procedure: 09/28/22   CHA2DS2-VASc Score = 4   This indicates a 4.8% annual risk of stroke. The patient's score is based upon: CHF History: 0 HTN History: 1 Diabetes History: 0 Stroke History: 0 Vascular Disease History: 1 Age Score: 2 Gender Score: 0    CrCl 55 Platelet count 229  Per office protocol, patient can hold Eliquis for 3 days prior to procedure.   Patient will not need bridging with Lovenox (enoxaparin) around procedure.  **This guidance is not considered finalized until pre-operative APP has relayed final recommendations.**

## 2022-09-22 NOTE — Telephone Encounter (Addendum)
   Patient Name: Stephen Stein  DOB: 21-Mar-1941 MRN: [id number]  Primary Cardiologist: None  Clinical pharmacists have reviewed the patient's past medical history, labs, and current medications as part of preoperative protocol coverage. The following recommendations have been made:   Patient with diagnosis of atrial fibrillation on Eliquis for anticoagulation.     Procedure: Left ORIF bimalleolar fracture Date of procedure: 09/28/22     CHA2DS2-VASc Score = 4   This indicates a 4.8% annual risk of stroke. The patient's score is based upon: CHF History: 0 HTN History: 1 Diabetes History: 0 Stroke History: 0 Vascular Disease History: 1 Age Score: 2 Gender Score: 0     CrCl 55 Platelet count 229   Per office protocol, patient can hold Eliquis for 3 days prior to procedure.  Patient will not need bridging with Lovenox (enoxaparin) around procedure. Please resume Eliquis as soon as possible postprocedure, at the discretion of the surgeon. Additionally, he may hold Aspirin for 3 days prior to the procedure.   The patient was advised that if he develops new symptoms prior to surgery to contact our office to arrange for a follow-up visit, and he verbalized understanding.  I will route this recommendation to the requesting party via Epic fax function and remove from pre-op pool.  Please call with questions.  Lenna Sciara, NP 09/22/2022, 1:46 PM

## 2022-09-26 DIAGNOSIS — S82842A Displaced bimalleolar fracture of left lower leg, initial encounter for closed fracture: Secondary | ICD-10-CM | POA: Diagnosis not present

## 2022-09-26 DIAGNOSIS — Z01818 Encounter for other preprocedural examination: Secondary | ICD-10-CM | POA: Diagnosis not present

## 2022-09-27 DIAGNOSIS — R001 Bradycardia, unspecified: Secondary | ICD-10-CM | POA: Diagnosis not present

## 2022-09-27 DIAGNOSIS — R5383 Other fatigue: Secondary | ICD-10-CM | POA: Diagnosis not present

## 2022-09-27 DIAGNOSIS — R4189 Other symptoms and signs involving cognitive functions and awareness: Secondary | ICD-10-CM | POA: Diagnosis not present

## 2022-09-27 DIAGNOSIS — G20A2 Parkinson's disease without dyskinesia, with fluctuations: Secondary | ICD-10-CM | POA: Diagnosis not present

## 2022-09-28 DIAGNOSIS — S9305XD Dislocation of left ankle joint, subsequent encounter: Secondary | ICD-10-CM | POA: Diagnosis not present

## 2022-09-28 DIAGNOSIS — S82842D Displaced bimalleolar fracture of left lower leg, subsequent encounter for closed fracture with routine healing: Secondary | ICD-10-CM | POA: Diagnosis not present

## 2022-09-28 DIAGNOSIS — S82842A Displaced bimalleolar fracture of left lower leg, initial encounter for closed fracture: Secondary | ICD-10-CM | POA: Diagnosis not present

## 2022-09-28 DIAGNOSIS — G8918 Other acute postprocedural pain: Secondary | ICD-10-CM | POA: Diagnosis not present

## 2022-09-28 DIAGNOSIS — Z7901 Long term (current) use of anticoagulants: Secondary | ICD-10-CM | POA: Diagnosis not present

## 2022-09-28 HISTORY — PX: ANKLE FRACTURE SURGERY: SHX122

## 2022-09-29 DIAGNOSIS — Z09 Encounter for follow-up examination after completed treatment for conditions other than malignant neoplasm: Secondary | ICD-10-CM | POA: Diagnosis not present

## 2022-10-11 DIAGNOSIS — S82842A Displaced bimalleolar fracture of left lower leg, initial encounter for closed fracture: Secondary | ICD-10-CM | POA: Diagnosis not present

## 2022-10-11 DIAGNOSIS — I4891 Unspecified atrial fibrillation: Secondary | ICD-10-CM | POA: Diagnosis not present

## 2022-10-13 ENCOUNTER — Encounter: Payer: Self-pay | Admitting: Cardiovascular Disease

## 2022-10-20 DIAGNOSIS — S82842A Displaced bimalleolar fracture of left lower leg, initial encounter for closed fracture: Secondary | ICD-10-CM | POA: Diagnosis not present

## 2022-10-21 ENCOUNTER — Encounter: Payer: Self-pay | Admitting: Family Medicine

## 2022-10-21 ENCOUNTER — Ambulatory Visit (INDEPENDENT_AMBULATORY_CARE_PROVIDER_SITE_OTHER): Payer: PPO | Admitting: Family Medicine

## 2022-10-21 VITALS — BP 118/62 | HR 83 | Temp 97.5°F | Resp 16 | Ht 70.0 in | Wt 203.4 lb

## 2022-10-21 DIAGNOSIS — I700 Atherosclerosis of aorta: Secondary | ICD-10-CM

## 2022-10-21 DIAGNOSIS — I4891 Unspecified atrial fibrillation: Secondary | ICD-10-CM

## 2022-10-21 DIAGNOSIS — G629 Polyneuropathy, unspecified: Secondary | ICD-10-CM | POA: Diagnosis not present

## 2022-10-21 DIAGNOSIS — F320 Major depressive disorder, single episode, mild: Secondary | ICD-10-CM | POA: Diagnosis not present

## 2022-10-21 DIAGNOSIS — Z7901 Long term (current) use of anticoagulants: Secondary | ICD-10-CM | POA: Diagnosis not present

## 2022-10-21 DIAGNOSIS — G2581 Restless legs syndrome: Secondary | ICD-10-CM | POA: Diagnosis not present

## 2022-10-21 DIAGNOSIS — N401 Enlarged prostate with lower urinary tract symptoms: Secondary | ICD-10-CM | POA: Diagnosis not present

## 2022-10-21 DIAGNOSIS — I25118 Atherosclerotic heart disease of native coronary artery with other forms of angina pectoris: Secondary | ICD-10-CM

## 2022-10-21 DIAGNOSIS — E782 Mixed hyperlipidemia: Secondary | ICD-10-CM | POA: Diagnosis not present

## 2022-10-21 DIAGNOSIS — Z23 Encounter for immunization: Secondary | ICD-10-CM

## 2022-10-21 DIAGNOSIS — Z9181 History of falling: Secondary | ICD-10-CM | POA: Insufficient documentation

## 2022-10-21 DIAGNOSIS — R7303 Prediabetes: Secondary | ICD-10-CM | POA: Diagnosis not present

## 2022-10-21 DIAGNOSIS — I10 Essential (primary) hypertension: Secondary | ICD-10-CM

## 2022-10-21 DIAGNOSIS — G20A1 Parkinson's disease without dyskinesia, without mention of fluctuations: Secondary | ICD-10-CM | POA: Diagnosis not present

## 2022-10-21 NOTE — Assessment & Plan Note (Signed)
On statin, monitoring 

## 2022-10-21 NOTE — Assessment & Plan Note (Signed)
Patient is on statin, not on aspirin, seeing cardiology

## 2022-10-21 NOTE — Progress Notes (Signed)
Name: MILAN PERKINS   MRN: [id number]    DOB: [birth year]   Date:10/21/2022       Progress Note  Chief Complaint  Patient presents with   Follow-up   Hypertension   Hyperlipidemia   PreDM     Subjective:   HUNTINGTON LEVERICH is a 81 y.o. male, presents to clinic for routine f/up  He has recently seen neuro, cardiology and is scheduled to see DR. Caryl Comes EP Also recent fall and fx with surgery per emergortho, he is in a cast with boot just cleared to bear some weight  Afib, CAD, HTTN, hx of SVT, 2nd degree heart block Recently put on eliquis, not on BB or other meds, HR ranges 60-80's BP at goal BP Readings from Last 3 Encounters:  10/21/22 118/62  06/13/22 (!) 110/50  04/28/22 135/80  Pt denies CP, exertional sx, LE edema, palpitation, Ha's, visual disturbances, hypotension.  Many years of near syncope unchanged   Hyperlipidemia:  labs recently done Currently treated with crestor 10, pt reports good med compliance Last Lipids: Lab Results  Component Value Date   CHOL 128 04/25/2022   HDL 36 (L) 04/25/2022   LDLCALC 71 04/25/2022   TRIG 120 04/25/2022   CHOLHDL 3.6 04/25/2022   - Denies: Chest pain, shortness of breath, myalgias, claudication  Seeing Dr. Caryl Comes and Dr. Rockey Situ for heart, recent ECHO, ECG, put back on eliquis   Prediabetes, labs last done 03/2021  Parkinsons, neuropathy, RLS - [facility], he hasn't noticed any changes or progression, but there have been two major falls/injury in the past year Feet somewhat numb, no nerve pain- on requip, sinemet  BPH - on tamsulosin at bedtime, LUTS stable, well controlled     Current Outpatient Medications:    acetaminophen (TYLENOL) 500 MG tablet, Take 1,000 mg by mouth every 6 (six) hours as needed for moderate pain., Disp: , Rfl:    apixaban (ELIQUIS) 5 MG TABS tablet, Take 1 tablet (5 mg total) by mouth 2 (two) times daily., Disp: 60 tablet, Rfl: 3   Azelastine HCl 137 MCG/SPRAY SOLN, PLACE 2 SPRAYS INTO  BOTH NOSTRILS 2 (TWO) TIMES DAILY, Disp: 30 mL, Rfl: 3   carbidopa-levodopa (SINEMET IR) 25-100 MG tablet, Take 2 tablets by mouth 3 (three) times daily., Disp: , Rfl:    Cholecalciferol (VITAMIN D3 PO), Take 1 tablet by mouth daily., Disp: , Rfl:    docusate sodium (COLACE) 100 MG capsule, Take 1 capsule (100 mg total) by mouth 2 (two) times daily., Disp: 30 capsule, Rfl: 0   meloxicam (MOBIC) 15 MG tablet, Take 15 mg by mouth daily., Disp: , Rfl:    memantine (NAMENDA) 5 MG tablet, Take 5 mg by mouth 2 (two) times daily., Disp: , Rfl:    rOPINIRole (REQUIP) 1 MG tablet, Take 1 mg by mouth 4 (four) times daily., Disp: , Rfl:    rosuvastatin (CRESTOR) 10 MG tablet, Take 1 tablet (10 mg total) by mouth daily., Disp: 90 tablet, Rfl: 3   sertraline (ZOLOFT) 100 MG tablet, Take 100 mg by mouth daily., Disp: , Rfl:    tamsulosin (FLOMAX) 0.4 MG CAPS capsule, TAKE 1 CAPSULE BY MOUTH EVERYDAY AT BEDTIME, Disp: 90 capsule, Rfl: 3   carbidopa-levodopa (SINEMET CR) 50-200 MG tablet, Take 1 tablet by mouth at bedtime., Disp: , Rfl:   Patient Active Problem List   Diagnosis Date Noted   Anal lesion 04/28/2022   Constipation 10/21/2021   Allergic rhinitis 10/21/2021   Olecranon  bursitis of right elbow 09/03/2021   S/P reverse total shoulder arthroplasty, right 08/02/2021   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[place]) 04/20/2021   NSVT (nonsustained ventricular tachycardia) ([place]) 10/20/2020   Parkinson's disease 10/20/2020   Status post total shoulder replacement, left 09/11/2019   Carotid atherosclerosis, bilateral 02/20/2019   Vitamin B12 deficiency 10/15/2018   Memory impairment 10/12/2018   Hx of major depression 10/12/2018   Left bundle branch block 06/25/2018   Second degree AV block, Mobitz type I 06/25/2018   Aortic calcification ([place]) 06/15/2018   Adrenal adenoma 05/09/2017   Pulmonary  nodules 04/19/2017   BPH (benign prostatic hyperplasia) 04/19/2017   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/04/2016   Coronary artery disease of native artery of native heart with stable angina pectoris ([place]) 03/25/2016   Hyperlipidemia 07/07/2015   Essential hypertension 07/07/2015    Past Surgical History:  Procedure Laterality Date   CATARACT EXTRACTION W/PHACO Left 12/09/2020   Procedure: CATARACT EXTRACTION PHACO AND INTRAOCULAR LENS PLACEMENT (IOC) LEFT 6.94 01:09.7 9.9% ;  Surgeon: Leandrew Koyanagi, MD;  Location: [facility];  Service: Ophthalmology;  Laterality: Left;   CATARACT EXTRACTION W/PHACO Right 12/23/2020   Procedure: CATARACT EXTRACTION PHACO AND INTRAOCULAR LENS PLACEMENT ([place]) RIGHT MALYUGIN;  Surgeon: Leandrew Koyanagi, MD;  Location: [facility];  Service: Ophthalmology;  Laterality: Right;  8.44 1:14.6 11.3%   CHOLECYSTECTOMY N/A 09/13/2018   Procedure: LAPAROSCOPIC CHOLECYSTECTOMY;  Surgeon: Jules Husbands, MD;  Location: [facility];  Service: General;  Laterality: N/A;   COLONOSCOPY  2008   ESOPHAGOGASTRODUODENOSCOPY (EGD) WITH PROPOFOL N/A 08/20/2018   Procedure: ESOPHAGOGASTRODUODENOSCOPY (EGD) WITH PROPOFOL;  Surgeon: Lucilla Lame, MD;  Location: [facility];  Service: Endoscopy;  Laterality: N/A;   TONSILLECTOMY AND ADENOIDECTOMY  1955   TOTAL SHOULDER ARTHROPLASTY Left 09/11/2019   Procedure: TOTAL SHOULDER ARTHROPLASTY;  Surgeon: Lovell Sheehan, MD;  Location: [facility];  Service: Orthopedics;  Laterality: Left;   TOTAL SHOULDER ARTHROPLASTY Right 08/02/2021   Procedure: TOTAL SHOULDER ARTHROPLASTY;  Surgeon: Lovell Sheehan, MD;  Location: [facility];  Service: Orthopedics;  Laterality: Right;    Family History  Problem Relation Age of Onset   Cancer Mother        lung   Heart disease Father    Hearing loss Sister        coclear implant   Hearing loss Brother    Cancer Paternal Grandmother    Cancer Son        colon cancer    Colon cancer Neg Hx    Stomach cancer Neg Hx     Social History   Tobacco Use   Smoking status: Former    Packs/day: 1.50    Years: 35.00    Total pack years: 52.50    Types: Cigarettes    Quit date: 06/22/1992    Years since quitting: 30.3   Smokeless tobacco: Former    Quit date: 06/22/1992  Vaping Use   Vaping Use: Never used  Substance Use Topics   Alcohol use: Yes    Alcohol/week: 1.0 standard drink of alcohol    Types: 1 Cans of beer per week    Comment: occasional   Drug use: No     No Known Allergies  Health Maintenance  Topic Date Due   COVID-19 Vaccine (4 - Moderna risk series) 12/14/2020   Zoster Vaccines- Shingrix (2 of 2) 03/18/2021   TETANUS/TDAP  06/20/2021   INFLUENZA VACCINE  07/26/2022   Medicare Annual Wellness (AWV)  12/07/2022   Pneumonia Vaccine 61+ Years old  Completed   HPV VACCINES  Aged Out    Chart Review Today: I personally reviewed active problem list, medication list, allergies, family history, social history, health maintenance, notes from last encounter, lab results, imaging with the patient/caregiver today.   Review of Systems  Constitutional: Negative.   HENT: Negative.    Eyes: Negative.   Respiratory: Negative.    Cardiovascular: Negative.   Gastrointestinal: Negative.   Endocrine: Negative.   Genitourinary: Negative.   Musculoskeletal: Negative.   Skin: Negative.   Allergic/Immunologic: Negative.   Neurological: Negative.   Hematological: Negative.   Psychiatric/Behavioral: Negative.    All other systems reviewed and are negative.    Objective:   Vitals:   10/21/22 0901  BP: 118/62  Pulse: 83  Resp: 16  Temp: (!) 97.5 F (36.4 C)  TempSrc: Oral  SpO2: 95%  Weight: 203 lb 6.4 oz (92.3 kg)  Height: '5\' 10"'$  (1.778 m)    Body mass index is 29.18 kg/m.  Physical Exam Vitals and nursing note reviewed.  Constitutional:      General: He is not in acute distress.    Appearance: Normal appearance. He is  well-developed and well-groomed. He is not ill-appearing, toxic-appearing or diaphoretic.  HENT:     Head: Normocephalic and atraumatic.     Right Ear: External ear normal.     Left Ear: External ear normal.     Nose: Nose normal.     Mouth/Throat:     Mouth: Mucous membranes are moist.     Pharynx: Oropharynx is clear.  Eyes:     General: No scleral icterus.       Right eye: No discharge.        Left eye: No discharge.     Conjunctiva/sclera: Conjunctivae normal.  Cardiovascular:     Rate and Rhythm: Normal rate. Rhythm irregularly irregular.     Chest Wall: PMI is not displaced.     Pulses: No decreased pulses.          Radial pulses are 2+ on the right side and 2+ on the left side.     Heart sounds: Heart sounds not distant. No murmur heard.    No friction rub. No gallop.  Pulmonary:     Effort: Pulmonary effort is normal. No respiratory distress.     Breath sounds: Normal breath sounds. No wheezing.  Abdominal:     General: Bowel sounds are normal.     Palpations: Abdomen is soft.  Musculoskeletal:     Comments: Left ankle cast in boot, seated in wheelchair   Skin:    Coloration: Skin is not jaundiced or pale.  Neurological:     Mental Status: He is alert.     Gait: Gait abnormal.  Psychiatric:        Mood and Affect: Mood normal.        Behavior: Behavior normal. Behavior is cooperative.         Assessment & Plan:   Problem List Items Addressed This Visit       Cardiovascular and Mediastinum   Essential hypertension - Primary (Chronic)    Blood pressure at goal today, not on any antihypertensives      Relevant Orders   COMPLETE METABOLIC PANEL WITH GFR   Coronary artery disease of native artery of native heart with stable angina pectoris ([place])  Patient is on statin, not on aspirin, seeing cardiology      Aortic calcification ([place])    On statin, monitoring      Atrial fibrillation ([place])    On exam patient has regular rhythm today but heart rate  has been 60-80, being cardiology and electrophysiology Anticoagulated but he is concerned about risk of bleeding and side effects so he has lowered the dose himself      Relevant Orders   COMPLETE METABOLIC PANEL WITH GFR   CBC with Differential/Platelet     Nervous and Auditory   Parkinson's disease    Per neurology patient and his wife deny any disease progression however he has had 2 major falls with significant injury this year, they do have neurology follow-up soon, on Sinemet      Neuropathy    He endorses mostly decree sensation in both of his feet but is not having nerve pain Encouraged him to check and monitor his feet for any injuries blisters or signs of infection        Genitourinary   BPH (benign prostatic hyperplasia)    Lower urinary tract symptoms well controlled with tamsulosin        Other   Hyperlipidemia    Labs done a few months ago LDL is near goal, continue Crestor 10 mg daily      Relevant Orders   COMPLETE METABOLIC PANEL WITH GFR   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[employer])    On Zoloft 100 mg daily prescribed by outside provider    10/21/2022    8:50 AM 05/10/2022    1:08 PM 04/22/2022    9:30 AM  Depression screen PHQ 2/9  Decreased Interest 0 0 0  Down, Depressed, Hopeless 0 0 0  PHQ - 2 Score 0 0 0  Altered sleeping 0  3  Tired, decreased energy 0  3  Change in appetite 0  0  Feeling bad or failure about yourself  0  0  Trouble concentrating 0  0  Moving slowly or fidgety/restless 0  0  Suicidal thoughts 0  0  PHQ-9 Score 0  6  Difficult doing work/chores Not difficult at all     PHQ-9 reviewed and negative today, patient very pleasant      Prediabetes    Recheck labs      Relevant Orders   COMPLETE METABOLIC PANEL WITH GFR   Hemoglobin A1c   RLS (restless legs syndrome)    On Requip per neurology      Other Visit Diagnoses     Need for influenza vaccination       Declined   Need for shingles vaccine        Declined   Need for Tdap vaccination       Not covered by insurance   Encounter for current long-term use of anticoagulants       Relevant Orders   COMPLETE METABOLIC PANEL WITH GFR   CBC with Differential/Platelet   At high risk for falls       Encouraged the patient to discuss falls mobility/gait with neuro, discussion and education about preventing falls done today with pt and wife        Return in about 6 months (around 04/22/2023) for Routine follow-up (and due for [place] if not already scheduled).   Delsa Grana, PA-C 10/21/22 9:13 AM

## 2022-10-21 NOTE — Assessment & Plan Note (Signed)
On exam patient has regular rhythm today but heart rate has been 60-80, being cardiology and electrophysiology Anticoagulated but he is concerned about risk of bleeding and side effects so he has lowered the dose himself

## 2022-10-21 NOTE — Assessment & Plan Note (Signed)
Labs done a few months ago LDL is near goal, continue Crestor 10 mg daily

## 2022-10-21 NOTE — Telephone Encounter (Signed)
Update from [facility]

## 2022-10-21 NOTE — Assessment & Plan Note (Signed)
Recheck labs 

## 2022-10-21 NOTE — Assessment & Plan Note (Signed)
On Requip per neurology

## 2022-10-21 NOTE — Assessment & Plan Note (Signed)
Lower urinary tract symptoms well controlled with tamsulosin

## 2022-10-21 NOTE — Assessment & Plan Note (Signed)
Blood pressure at goal today, not on any antihypertensives

## 2022-10-21 NOTE — Assessment & Plan Note (Signed)
Per neurology patient and his wife deny any disease progression however he has had 2 major falls with significant injury this year, they do have neurology follow-up soon, on Sinemet

## 2022-10-21 NOTE — Telephone Encounter (Signed)
Copied from [place] [phone]. Topic: General - Other >> Oct 21, 2022  1:57 PM Everette C wrote: Reason for CRM: [place] with [facility] has called to share that the patient's vaccine information can be viewed in [facility]   Please contact [facility] further if needed

## 2022-10-21 NOTE — Assessment & Plan Note (Signed)
On Zoloft 100 mg daily prescribed by outside provider    10/21/2022    8:50 AM 05/10/2022    1:08 PM 04/22/2022    9:30 AM  Depression screen PHQ 2/9  Decreased Interest 0 0 0  Down, Depressed, Hopeless 0 0 0  PHQ - 2 Score 0 0 0  Altered sleeping 0  3  Tired, decreased energy 0  3  Change in appetite 0  0  Feeling bad or failure about yourself  0  0  Trouble concentrating 0  0  Moving slowly or fidgety/restless 0  0  Suicidal thoughts 0  0  PHQ-9 Score 0  6  Difficult doing work/chores Not difficult at all     PHQ-9 reviewed and negative today, patient very pleasant

## 2022-10-21 NOTE — Assessment & Plan Note (Signed)
He endorses mostly decree sensation in both of his feet but is not having nerve pain Encouraged him to check and monitor his feet for any injuries blisters or signs of infection

## 2022-10-22 LAB — CBC WITH DIFFERENTIAL/PLATELET
Absolute Monocytes: 664 cells/uL (ref 200–950)
Basophils Absolute: 51 cells/uL (ref 0–200)
Basophils Relative: 0.7 %
Eosinophils Absolute: 613 cells/uL — ABNORMAL HIGH (ref 15–500)
Eosinophils Relative: 8.4 %
HCT: 37.8 % — ABNORMAL LOW (ref 38.5–50.0)
Hemoglobin: 12.5 g/dL — ABNORMAL LOW (ref 13.2–17.1)
Lymphs Abs: 1759 cells/uL (ref 850–3900)
MCH: 30.8 pg (ref 27.0–33.0)
MCHC: 33.1 g/dL (ref 32.0–36.0)
MCV: 93.1 fL (ref 80.0–100.0)
MPV: 9.7 fL (ref 7.5–12.5)
Monocytes Relative: 9.1 %
Neutro Abs: 4212 cells/uL (ref 1500–7800)
Neutrophils Relative %: 57.7 %
Platelets: 289 10*3/uL (ref 140–400)
RBC: 4.06 10*6/uL — ABNORMAL LOW (ref 4.20–5.80)
RDW: 12.7 % (ref 11.0–15.0)
Total Lymphocyte: 24.1 %
WBC: 7.3 10*3/uL (ref 3.8–10.8)

## 2022-10-22 LAB — COMPLETE METABOLIC PANEL WITH GFR
AG Ratio: 1.4 (calc) (ref 1.0–2.5)
ALT: 15 U/L (ref 9–46)
AST: 16 U/L (ref 10–35)
Albumin: 4.2 g/dL (ref 3.6–5.1)
Alkaline phosphatase (APISO): 41 U/L (ref 35–144)
BUN: 16 mg/dL (ref 7–25)
CO2: 26 mmol/L (ref 20–32)
Calcium: 9.7 mg/dL (ref 8.6–10.3)
Chloride: 103 mmol/L (ref 98–110)
Creat: 1.08 mg/dL (ref 0.70–1.22)
Globulin: 2.9 g/dL (calc) (ref 1.9–3.7)
Glucose, Bld: 99 mg/dL (ref 65–99)
Potassium: 4.6 mmol/L (ref 3.5–5.3)
Sodium: 138 mmol/L (ref 135–146)
Total Bilirubin: 0.4 mg/dL (ref 0.2–1.2)
Total Protein: 7.1 g/dL (ref 6.1–8.1)
eGFR: 69 mL/min/{1.73_m2} (ref >=60)

## 2022-10-22 LAB — HEMOGLOBIN A1C
Hgb A1c MFr Bld: 5.8 % of total Hgb — ABNORMAL HIGH (ref <5.7)
Mean Plasma Glucose: 120 mg/dL
eAG (mmol/L): 6.6 mmol/L

## 2022-10-26 ENCOUNTER — Ambulatory Visit: Payer: PPO | Admitting: Dermatology

## 2022-10-26 ENCOUNTER — Other Ambulatory Visit: Payer: Self-pay

## 2022-10-26 DIAGNOSIS — L814 Other melanin hyperpigmentation: Secondary | ICD-10-CM

## 2022-10-26 DIAGNOSIS — L821 Other seborrheic keratosis: Secondary | ICD-10-CM

## 2022-10-26 DIAGNOSIS — L578 Other skin changes due to chronic exposure to nonionizing radiation: Secondary | ICD-10-CM | POA: Diagnosis not present

## 2022-10-26 DIAGNOSIS — L820 Inflamed seborrheic keratosis: Secondary | ICD-10-CM

## 2022-10-26 DIAGNOSIS — Z1283 Encounter for screening for malignant neoplasm of skin: Secondary | ICD-10-CM

## 2022-10-26 DIAGNOSIS — D649 Anemia, unspecified: Secondary | ICD-10-CM

## 2022-10-26 DIAGNOSIS — L439 Lichen planus, unspecified: Secondary | ICD-10-CM | POA: Diagnosis not present

## 2022-10-26 DIAGNOSIS — Z85828 Personal history of other malignant neoplasm of skin: Secondary | ICD-10-CM

## 2022-10-26 DIAGNOSIS — D229 Melanocytic nevi, unspecified: Secondary | ICD-10-CM

## 2022-10-26 MED ORDER — CLOBETASOL PROP EMOLLIENT BASE 0.05 % EX CREA: 1.0000 | TOPICAL_CREAM | Freq: Two times a day (BID) | CUTANEOUS | 1 refills | Status: DC

## 2022-10-26 NOTE — Patient Instructions (Signed)
Topical steroids (such as triamcinolone, fluocinolone, fluocinonide, mometasone, clobetasol, halobetasol, betamethasone, hydrocortisone) can cause thinning and lightening of the skin if they are used for too long in the same area. Your physician has selected the right strength medicine for your problem and area affected on the body. Please use your medication only as directed by your physician to prevent side effects.    Cryotherapy Aftercare  Wash gently with soap and water everyday.   Apply Vaseline and Band-Aid daily until healed.    Due to recent changes in healthcare laws, you may see results of your pathology and/or laboratory studies on MyChart before the doctors have had a chance to review them. We understand that in some cases there may be results that are confusing or concerning to you. Please understand that not all results are received at the same time and often the doctors may need to interpret multiple results in order to provide you with the best plan of care or course of treatment. Therefore, we ask that you please give [place] 2 business days to thoroughly review all your results before contacting the office for clarification. Should we see a critical lab result, you will be contacted sooner.   If You Need Anything After Your Visit  If you have any questions or concerns for your doctor, please call our main line at [phone] and press option 4 to reach your doctor's medical assistant. If no one answers, please leave a voicemail as directed and we will return your call as soon as possible. Messages left after 4 pm will be answered the following business day.   You may also send [place] a message via [place]. We typically respond to MyChart messages within 1-2 business days.  For prescription refills, please ask your pharmacy to contact our office. Our fax number is [phone].  If you have an urgent issue when the clinic is closed that cannot wait until the next business day, you can  page your doctor at the number below.    Please note that while we do our best to be available for urgent issues outside of office hours, we are not available 24/7.   If you have an urgent issue and are unable to reach [place], you may choose to seek medical care at your doctor's office, retail clinic, urgent care center, or emergency room.  If you have a medical emergency, please immediately call 911 or go to the emergency department.  Pager Numbers  - Dr. Nehemiah Massed: [phone]  - Dr. Laurence Ferrari: [phone]  - Dr. Nicole Kindred: [phone]  In the event of inclement weather, please call our main line at [phone] for an update on the status of any delays or closures.  Dermatology Medication Tips: Please keep the boxes that topical medications come in in order to help keep track of the instructions about where and how to use these. Pharmacies typically print the medication instructions only on the boxes and not directly on the medication tubes.   If your medication is too expensive, please contact our office at [phone] option 4 or send [place] a message through [place].   We are unable to tell what your co-pay for medications will be in advance as this is different depending on your insurance coverage. However, we may be able to find a substitute medication at lower cost or fill out paperwork to get insurance to cover a needed medication.   If a prior authorization is required to get your medication covered by your insurance company, please allow [place]  1-2 business days to complete this process.  Drug prices often vary depending on where the prescription is filled and some pharmacies may offer cheaper prices.  The website www.goodrx.com contains coupons for medications through different pharmacies. The prices here do not account for what the cost may be with help from insurance (it may be cheaper with your insurance), but the website can give you the price if you did not use any insurance.  - You  can print the associated coupon and take it with your prescription to the pharmacy.  - You may also stop by our office during regular business hours and pick up a GoodRx coupon card.  - If you need your prescription sent electronically to a different pharmacy, notify our office through [facility] or by phone at [phone] option 4.     Si Usted Necesita Algo Despus de Su Visita  Tambin puede enviarnos un mensaje a travs de Pharmacist, community. Por lo general respondemos a los mensajes de MyChart en el transcurso de 1 a 2 das hbiles.  Para renovar recetas, por favor pida a su farmacia que se ponga en contacto con nuestra oficina. Harland Dingwall de fax es [place] [phone].  Si tiene un asunto urgente cuando la clnica est cerrada y que no puede esperar hasta el siguiente da hbil, puede llamar/localizar a su doctor(a) al nmero que aparece a continuacin.   Por favor, tenga en cuenta que aunque hacemos todo lo posible para estar disponibles para asuntos urgentes fuera del horario de [place], no estamos disponibles las 24 horas del da, los 7 das de la [place].   Si tiene un problema urgente y no puede comunicarse con nosotros, puede optar por buscar atencin mdica  en el consultorio de su doctor(a), en una clnica privada, en un centro de atencin urgente o en una sala de emergencias.  Si tiene Engineering geologist, por favor llame inmediatamente al 911 o vaya a la sala de emergencias.  Nmeros de bper  - Dr. Nehemiah Massed: [phone]  - Dra. Moye: [phone]  - Dra. Nicole Kindred: [phone]  En caso de inclemencias del [place], por favor llame a Johnsie Kindred principal al [phone] para una actualizacin sobre el [place] de cualquier retraso o cierre.  Consejos para la medicacin en dermatologa: Por favor, guarde las cajas en las que vienen los medicamentos de uso tpico para ayudarle a seguir las instrucciones sobre dnde y cmo usarlos. Las farmacias generalmente imprimen las  instrucciones del medicamento slo en las cajas y no directamente en los tubos del [place].   Si su medicamento es muy caro, por favor, pngase en contacto con Zigmund Daniel llamando al [phone] y presione la opcin 4 o envenos un mensaje a travs de Pharmacist, community.   No podemos decirle cul ser su copago por los medicamentos por adelantado ya que esto es diferente dependiendo de la cobertura de su seguro. Sin embargo, es posible que podamos encontrar un medicamento sustituto a Electrical engineer un formulario para que el seguro cubra el medicamento que se considera necesario.   Si se requiere una autorizacin previa para que su compaa de seguros [place] su medicamento, por favor permtanos de 1 a 2 das hbiles para completar este proceso.  Los precios de los medicamentos varan con frecuencia dependiendo del Environmental consultant de dnde se surte la receta y alguna farmacias pueden ofrecer precios ms baratos.  El sitio web www.goodrx.com tiene cupones para medicamentos de Airline pilot. Los precios aqu no tienen en cuenta lo que podra costar  con la ayuda del seguro (puede ser ms barato con su seguro), pero el sitio web puede darle el precio si no utiliz ningn seguro.  - Puede imprimir el cupn correspondiente y llevarlo con su receta a la farmacia.  - Tambin puede pasar por nuestra oficina durante el horario de atencin regular y recoger una tarjeta de cupones de GoodRx.  - Si necesita que su receta se enve electrnicamente a una farmacia diferente, informe a nuestra oficina a travs de MyChart de [place] o por telfono llamando al [phone] y presione la opcin 4.  

## 2022-10-26 NOTE — Progress Notes (Signed)
Follow-Up Visit   Subjective  Stephen Stein is a 81 y.o. male who presents for the following: Annual Exam (History of BCC - The patient presents for Total-Body Skin Exam (TBSE) for skin cancer screening and mole check.  The patient has spots, moles and lesions to be evaluated, some may be new or changing and the patient has concerns that these could be cancer./).  The following portions of the chart were reviewed this encounter and updated as appropriate:   Tobacco  Allergies  Meds  Problems  Med Hx  Surg Hx  Fam Hx     Review of Systems:  No other skin or systemic complaints except as noted in HPI or Assessment and Plan.  Objective  Well appearing patient in no apparent distress; mood and affect are within normal limits.  A full examination was performed including scalp, head, eyes, ears, nose, lips, neck, chest, axillae, abdomen, back, buttocks, bilateral upper extremities, bilateral lower extremities, hands, feet, fingers, toes, fingernails, and toenails. All findings within normal limits unless otherwise noted below.  Left neck (3) Erythematous stuck-on, waxy papule or plaque   Assessment & Plan   History of Basal Cell Carcinoma of the Skin - No evidence of recurrence today - Recommend regular full body skin exams - Recommend daily broad spectrum sunscreen SPF 30+ to sun-exposed areas, reapply every 2 hours as needed.  - Call if any new or changing lesions are noted between office visits  Lentigines - Scattered tan macules - Due to sun exposure - Benign-appearing, observe - Recommend daily broad spectrum sunscreen SPF 30+ to sun-exposed areas, reapply every 2 hours as needed. - Call for any changes  Seborrheic Keratoses - Stuck-on, waxy, tan-brown papules and/or plaques  - Benign-appearing - Discussed benign etiology and prognosis. - Observe - Call for any changes  Melanocytic Nevi - Tan-brown and/or pink-flesh-colored symmetric macules and papules - Benign  appearing on exam today - Observation - Call clinic for new or changing moles - Recommend daily use of broad spectrum spf 30+ sunscreen to sun-exposed areas.   Hemangiomas - Red papules - Discussed benign nature - Observe - Call for any changes  Actinic Damage - Chronic condition, secondary to cumulative UV/sun exposure - diffuse scaly erythematous macules with underlying dyspigmentation - Recommend daily broad spectrum sunscreen SPF 30+ to sun-exposed areas, reapply every 2 hours as needed.  - Staying in the shade or wearing long sleeves, sun glasses (UVA+UVB protection) and wide brim hats (4-inch brim around the entire circumference of the hat) are also recommended for sun protection.  - Call for new or changing lesions.  Skin cancer screening performed today.  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 Condition is symptomatic / bothersome to patient. Not to goal. Clobetasol cream bid up to 5 days per week - Avoid face, groin, underarms  Clobetasol Prop Emollient Base (CLOBETASOL PROPIONATE E) 0.05 % emollient cream - Arms Apply 1 Application topically 2 (two) times daily. Up to 5 days per week prn to affected areas - avoid face, groin, underarms  Inflamed seborrheic keratosis (3) Left neck Destruction of lesion - Left neck Complexity: simple   Destruction method: cryotherapy   Informed consent: discussed and consent obtained   Timeout:  patient name, date of birth, surgical site, and procedure verified Lesion destroyed using liquid nitrogen: Yes   Region frozen until ice ball extended beyond lesion: Yes   Outcome: patient tolerated procedure well with no complications  Post-procedure details: wound care instructions given    Return in about 1 year (around 10/27/2023) for TBSE.  I, Ashok Cordia, CMA, am acting as scribe for Sarina Ser, MD . Documentation: I have reviewed the above documentation for accuracy  and completeness, and I agree with the above.  Sarina Ser, MD

## 2022-10-27 ENCOUNTER — Ambulatory Visit: Payer: PPO | Attending: Internal Medicine | Admitting: Internal Medicine

## 2022-10-27 ENCOUNTER — Encounter: Payer: Self-pay | Admitting: Internal Medicine

## 2022-10-27 VITALS — BP 140/56 | HR 62 | Ht 70.0 in | Wt 200.0 lb

## 2022-10-27 DIAGNOSIS — E782 Mixed hyperlipidemia: Secondary | ICD-10-CM

## 2022-10-27 DIAGNOSIS — I10 Essential (primary) hypertension: Secondary | ICD-10-CM | POA: Diagnosis not present

## 2022-10-27 DIAGNOSIS — I25118 Atherosclerotic heart disease of native coronary artery with other forms of angina pectoris: Secondary | ICD-10-CM

## 2022-10-27 DIAGNOSIS — I4891 Unspecified atrial fibrillation: Secondary | ICD-10-CM | POA: Diagnosis not present

## 2022-10-27 MED ORDER — APIXABAN 5 MG PO TABS: 5.0000 mg | ORAL_TABLET | Freq: Two times a day (BID) | ORAL | Status: DC

## 2022-10-27 NOTE — Progress Notes (Signed)
Patient Care Team: Delsa Grana, PA-C as PCP - General (Family Medicine) Minna Merritts, MD as Consulting Physician (Cardiology) Vladimir Crofts, MD as Consulting Physician (Neurology) Ralene Bathe, MD (Dermatology)   HPI  Stephen Stein is a 81 y.o. male Seen in follow-up for nonpositional dizziness associate with neurocognitive defects and emotional lability.  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  He was seen April 2022 and noted by primary care to have bradycardia  HR 40's with 3:2 conduction>  gxt 6/22 HR >> 140 >>NO chronotropic incompetence   The patient denies chest pain, shortness of breath, nocturnal dyspnea, orthopnea or peripheral edema.  There have been no palpitations, lightheadedness or syncope.  He fell and broke his left foot and has undergone surgery and comes in today to wheelchair.  He is in atrial fibrillation and is unaware of it he was found to be in atrial fibrillation 6/23, 6/22 he was in sinus rhythm.  No changes in exercise tolerance.  His wife is undergoing chemotherapy for ovarian cancer.   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 08/2018 s/p lap chole.   BPH (benign prostatic hyperplasia)    Carotid atherosclerosis, bilateral    a. 01/2019 Carotid U/S: <50% bilat ICA stenosis.   Constipation 10/21/2021   Coronary artery calcification seen on CT scan    a. 11/2015 Cardiac CT: Cor Ca2+ = 892 (80th %'ile). Diffuse Ca2+ in all 3 major  coronary arteries; b. 02/2016 MV: HTN response to exercise. Abnl ecg w/ horizontal inflat ST depression, but no ischemia/infarct on imaging; c. 09/2018 MV: EF 64%, no ischemia. Very sm region of mild distal antsept defect, likely 2/2 lbbb.   Depression    Grade I diastolic dysfunction    Hepatic steatosis    Hx of basal cell carcinoma 08/01/2019   R chest parasternal   Hyperlipidemia    Hypertension    Irregular heartbeat/Palpitations    a. Has been taking Atenolol for 20 years   LBBB (left bundle branch block)    Left ventricular hypokinesis    a. 09/2018 Echo: EF 45-50%, mod LVH, diff HK. Gr1 DD. Triv AI. Mildly dil Ao root. Mildly reduced RV fxn.   Lightheadedness    Mild cognitive impairment    NSVT (nonsustained ventricular tachycardia) ([place])    a. 11/2018 Zio monitor - 13 beats of NSVT--asymptomatic.   Orthostatic hypotension    a. 11/2018 pressures improved w/ midodrine/florinef.   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 11/2018 Zio monitor: Avg HR 57 (min 28). Intermittent Mobitz I.    Past Surgical History:  Procedure Laterality Date   CATARACT EXTRACTION [facility] Left 12/09/2020  Procedure: CATARACT EXTRACTION PHACO AND INTRAOCULAR LENS PLACEMENT ([facility]) LEFT 6.94 01:09.7 9.9% ;  Surgeon: Leandrew Koyanagi, MD;  Location: [place];  Service: Ophthalmology;  Laterality: Left;   CATARACT EXTRACTION W/PHACO Right 12/23/2020   Procedure: CATARACT EXTRACTION PHACO AND INTRAOCULAR LENS PLACEMENT ([place]) RIGHT MALYUGIN;  Surgeon: Leandrew Koyanagi, MD;  Location: [place];  Service: Ophthalmology;  Laterality: Right;  8.44 1:14.6 11.3%   CHOLECYSTECTOMY N/A 09/13/2018   Procedure: LAPAROSCOPIC CHOLECYSTECTOMY;  Surgeon: Jules Husbands, MD;  Location: [facility];  Service: General;  Laterality: N/A;   COLONOSCOPY  2008   ESOPHAGOGASTRODUODENOSCOPY (EGD) WITH PROPOFOL N/A 08/20/2018   Procedure:  ESOPHAGOGASTRODUODENOSCOPY (EGD) WITH PROPOFOL;  Surgeon: Lucilla Lame, MD;  Location: [facility];  Service: Endoscopy;  Laterality: N/A;   TONSILLECTOMY AND ADENOIDECTOMY  1955   TOTAL SHOULDER ARTHROPLASTY Left 09/11/2019   Procedure: TOTAL SHOULDER ARTHROPLASTY;  Surgeon: Lovell Sheehan, MD;  Location: [facility];  Service: Orthopedics;  Laterality: Left;   TOTAL SHOULDER ARTHROPLASTY Right 08/02/2021   Procedure: TOTAL SHOULDER ARTHROPLASTY;  Surgeon: Lovell Sheehan, MD;  Location: [facility];  Service: Orthopedics;  Laterality: Right;    Current Meds  Medication Sig   acetaminophen (TYLENOL) 500 MG tablet Take 1,000 mg by mouth every 6 (six) hours as needed for moderate pain.   apixaban (ELIQUIS) 2.5 MG TABS tablet Take 2.5 mg by mouth 2 (two) times daily.   Azelastine HCl 137 MCG/SPRAY SOLN PLACE 2 SPRAYS INTO BOTH NOSTRILS 2 (TWO) TIMES DAILY   carbidopa-levodopa (SINEMET CR) 50-200 MG tablet Take 1 tablet by mouth at bedtime.   carbidopa-levodopa (SINEMET IR) 25-100 MG tablet Take 2 tablets by mouth 3 (three) times daily.   Cholecalciferol (VITAMIN D3 PO) Take 1 tablet by mouth daily.   Clobetasol Prop Emollient Base (CLOBETASOL PROPIONATE E) 0.05 % emollient cream Apply 1 Application topically 2 (two) times daily. Up to 5 days per week prn to affected areas - avoid face, groin, underarms   docusate sodium (COLACE) 100 MG capsule Take 1 capsule (100 mg total) by mouth 2 (two) times daily.   meloxicam (MOBIC) 15 MG tablet Take 15 mg by mouth daily.   memantine (NAMENDA) 5 MG tablet Take 5 mg by mouth 2 (two) times daily.   rOPINIRole (REQUIP) 1 MG tablet Take 1 mg by mouth 4 (four) times daily.   rosuvastatin (CRESTOR) 10 MG tablet Take 1 tablet (10 mg total) by mouth daily.   sertraline (ZOLOFT) 100 MG tablet Take 100 mg by mouth daily.   tamsulosin (FLOMAX) 0.4 MG CAPS capsule TAKE 1 CAPSULE BY MOUTH EVERYDAY AT BEDTIME    No Known Allergies    Review of Systems negative except  from HPI and PMH  Physical Exam BP (!) 140/56 (BP Location: Left Arm, Patient Position: Sitting, Cuff Size: Normal)   Pulse 62   Ht '5\' 10"'$  (1.778 m)   Wt 200 lb (90.7 kg)   SpO2 97%   BMI 28.70 kg/m  Well developed and nourished in no acute distress HENT normal Neck supple Clear Irregularly irregular rate and rhythm, no murmurs or gallops Abd-soft with active BS No Clubbing cyanosis edema Skin-warm and dry A & Oriented  Grossly normal sensory and motor function  ECG atrial fibrillation at 62 Interval-/16/48 Left bundle branch block Estimated Creatinine Clearance: 61.8 mL/min (by C-G formula based on SCr of 1.08 mg/dL).   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  He does have some evidence of heart block on the 6/23 ECG heart rates are better today.  The discussion was whether to leave him in atrial fibrillation or try to restore sinus rhythm.  In general, sinus rhythm is worth pursuing by cardioversion without drugs at least once, I will defer this to Dr. Deidre Ala but given his paucity of symptoms, I would not make any effort to maintain sinus rhythm.  With his neurodegenerative disease, the atrial fibrillation outcomes are probably not reasonably extrapolated.  Discussion regarding apixaban dosing, and have advised him to resume 5 mg twice daily and we have looked at some of the data which is associate with increased mortality and increased risk of stroke associated with underdosing but no increased risk of bleeding       Current medicines are reviewed at length with the patient today .  The patient does not have concerns regarding medicines.

## 2022-10-27 NOTE — Patient Instructions (Signed)
Medication Instructions:  - Your physician has recommended you make the following change in your medication:   1) INCREASE Eliquis to 5 mg: - take 1 tablet by mouth TWICE daily     *If you need a refill on your cardiac medications before your next appointment, please call your pharmacy*   Lab Work: - none ordered  If you have labs (blood work) drawn today and your tests are completely normal, you will receive your results only by: [place] (if you have MyChart) OR A paper copy in the mail If you have any lab test that is abnormal or we need to change your treatment, we will call you to review the results.   Testing/Procedures: - none ordered   Follow-Up: At [facility], you and your health needs are our priority.  As part of our continuing mission to provide you with exceptional heart care, we have created designated Provider Care Teams.  These Care Teams include your primary Cardiologist (physician) and Advanced Practice Providers (APPs -  Physician Assistants and Nurse Practitioners) who all work together to provide you with the care you need, when you need it.  We recommend signing up for the patient portal called "MyChart".  Sign up information is provided on this After Visit Summary.  MyChart is used to connect with patients for Virtual Visits (Telemedicine).  Patients are able to view lab/test results, encounter notes, upcoming appointments, etc.  Non-urgent messages can be sent to your provider as well.   To learn more about what you can do with MyChart, go to [email or web address].    Your next appointment:   1) As needed with Dr. Caryl Comes   2) As scheduled with Dr. Rockey Situ  The format for your next appointment:   In Person  Provider:   As above     Other Instructions N/a  Important Information About Sugar

## 2022-11-01 DIAGNOSIS — I4891 Unspecified atrial fibrillation: Secondary | ICD-10-CM | POA: Diagnosis not present

## 2022-11-01 DIAGNOSIS — S82842D Displaced bimalleolar fracture of left lower leg, subsequent encounter for closed fracture with routine healing: Secondary | ICD-10-CM | POA: Diagnosis not present

## 2022-11-09 ENCOUNTER — Encounter: Payer: Self-pay | Admitting: Dermatology

## 2022-11-11 DIAGNOSIS — I739 Peripheral vascular disease, unspecified: Secondary | ICD-10-CM | POA: Diagnosis not present

## 2022-11-11 DIAGNOSIS — G20A1 Parkinson's disease without dyskinesia, without mention of fluctuations: Secondary | ICD-10-CM | POA: Diagnosis not present

## 2022-11-11 DIAGNOSIS — D692 Other nonthrombocytopenic purpura: Secondary | ICD-10-CM | POA: Diagnosis not present

## 2022-11-11 DIAGNOSIS — Z7982 Long term (current) use of aspirin: Secondary | ICD-10-CM | POA: Diagnosis not present

## 2022-11-11 DIAGNOSIS — F0150 Vascular dementia without behavioral disturbance: Secondary | ICD-10-CM | POA: Diagnosis not present

## 2022-11-11 DIAGNOSIS — Z6828 Body mass index (BMI) 28.0-28.9, adult: Secondary | ICD-10-CM | POA: Diagnosis not present

## 2022-11-11 DIAGNOSIS — I10 Essential (primary) hypertension: Secondary | ICD-10-CM | POA: Diagnosis not present

## 2022-11-11 DIAGNOSIS — I4819 Other persistent atrial fibrillation: Secondary | ICD-10-CM | POA: Diagnosis not present

## 2022-11-11 DIAGNOSIS — Z87891 Personal history of nicotine dependence: Secondary | ICD-10-CM | POA: Diagnosis not present

## 2022-11-15 DIAGNOSIS — Z4889 Encounter for other specified surgical aftercare: Secondary | ICD-10-CM | POA: Diagnosis not present

## 2022-11-20 ENCOUNTER — Other Ambulatory Visit: Payer: Self-pay | Admitting: Unknown Physician Specialty

## 2022-11-20 DIAGNOSIS — E782 Mixed hyperlipidemia: Secondary | ICD-10-CM

## 2022-11-22 NOTE — Telephone Encounter (Signed)
Requested Prescriptions  Pending Prescriptions Disp Refills   rosuvastatin (CRESTOR) 10 MG tablet [Pharmacy Med Name: ROSUVASTATIN CALCIUM 10 MG TAB] 90 tablet 0    Sig: TAKE 1 TABLET BY MOUTH EVERY DAY     Cardiovascular:  Antilipid - Statins 2 Failed - 11/20/2022  1:20 AM      Failed - Lipid Panel in normal range within the last 12 months    Cholesterol, Total  Date Value Ref Range Status  06/01/2016 110 100 - 199 mg/dL Final   Cholesterol  Date Value Ref Range Status  04/25/2022 128 <200 mg/dL Final   LDL Cholesterol (Calc)  Date Value Ref Range Status  04/25/2022 71 mg/dL (calc) Final    Comment:    Reference range: <100 . Desirable range <100 mg/dL for primary prevention;   <70 mg/dL for patients with CHD or diabetic patients  with > or = 2 CHD risk factors. [name] LDL-C is now calculated using the Martin-Hopkins  calculation, which is a validated novel method providing  better accuracy than the Friedewald equation in the  estimation of LDL-C.  Cresenciano Genre et al. Annamaria Helling. 7824;235(36): 2061-2068  (http://education.[employer].com/faq/FAQ164)    HDL  Date Value Ref Range Status  04/25/2022 36 (L) > OR = 40 mg/dL Final  06/01/2016 39 (L) >39 mg/dL Final   Triglycerides  Date Value Ref Range Status  04/25/2022 120 <150 mg/dL Final         Passed - Cr in normal range and within 360 days    Creat  Date Value Ref Range Status  10/21/2022 1.08 0.70 - 1.22 mg/dL Final         Passed - Patient is not pregnant      Passed - Valid encounter within last 12 months    Recent Outpatient Visits           1 month ago Essential hypertension   [facility] Delsa Grana, PA-C   6 months ago Closed fracture of multiple ribs of left side, initial encounter   [facility] Delsa Grana, PA-C   7 months ago Mixed hyperlipidemia   [place], Dani Gobble, PA-C   1 year ago Constipation, unspecified constipation type    [place], NP   1 year ago Lower urinary tract symptoms (LUTS)   [facility] Delsa Grana, PA-C       Future Appointments             In 2 weeks  [facility], [place]   In 3 weeks Gollan, Kathlene November, MD [place]. [place]   In 5 months Delsa Grana, PA-C [facility], [place]   In 11 months Ralene Bathe, MD [place]

## 2022-11-27 ENCOUNTER — Ambulatory Visit
Admission: EM | Admit: 2022-11-27 | Discharge: 2022-11-27 | Disposition: A | Discharge status: Home / Self Care | Payer: PPO | Attending: Emergency Medicine | Admitting: Emergency Medicine

## 2022-11-27 DIAGNOSIS — L03115 Cellulitis of right lower limb: Secondary | ICD-10-CM

## 2022-11-27 DIAGNOSIS — T148XXA Other injury of unspecified body region, initial encounter: Secondary | ICD-10-CM | POA: Diagnosis not present

## 2022-11-27 DIAGNOSIS — L089 Local infection of the skin and subcutaneous tissue, unspecified: Secondary | ICD-10-CM

## 2022-11-27 MED ORDER — DOXYCYCLINE HYCLATE 100 MG PO CAPS: 100.0000 mg | ORAL_CAPSULE | Freq: Two times a day (BID) | ORAL | 0 refills | Status: AC

## 2022-11-27 NOTE — ED Provider Notes (Signed)
Stephen Stein    CSN: [id number] Arrival date & time: 11/27/22  0910      History   Chief Complaint Chief Complaint  Patient presents with   Knee Pain    HPI Stephen Stein is a 81 y.o. male.  Patient presents with a sore on his inner right thigh x 4 days.  The area around the area is red and hard.  It had a "white head" and he attempted to "lance" it yesterday but only had a small amount of drainage.  He denies fever, chills, red streaks, numbness, weakness, or other symptoms.  His medical history includes hypertension, diastolic dysfunction, left bundle branch block, Parkinson's disease.  The history is provided by the patient and medical records.    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 08/2018 s/p lap chole.   BPH (benign prostatic hyperplasia)    Carotid atherosclerosis, bilateral    a. 01/2019 Carotid U/S: <50% bilat ICA stenosis.   Constipation 10/21/2021   Coronary artery calcification seen on CT scan    a. 11/2015 Cardiac CT: Cor Ca2+ = 892 (80th %'ile). Diffuse Ca2+ in all 3 major coronary arteries; b. 02/2016 MV: HTN response to exercise. Abnl ecg w/ horizontal inflat ST depression, but no ischemia/infarct on imaging; c. 09/2018 MV: EF 64%, no ischemia. Very sm region of mild distal antsept defect, likely 2/2 lbbb.   Depression    Grade I diastolic dysfunction    Hepatic steatosis    Hx of basal cell carcinoma 08/01/2019   R chest parasternal   Hyperlipidemia    Hypertension    Irregular heartbeat/Palpitations    a. Has been taking Atenolol for 20 years   LBBB (left bundle branch block)    Left ventricular hypokinesis    a. 09/2018 Echo: EF 45-50%, mod LVH, diff HK. Gr1 DD. Triv AI. Mildly dil Ao root. Mildly reduced RV fxn.   Lightheadedness    Mild cognitive impairment    NSVT (nonsustained ventricular tachycardia) ([place])     a. 11/2018 Zio monitor - 13 beats of NSVT--asymptomatic.   Orthostatic hypotension    a. 11/2018 pressures improved w/ midodrine/florinef.   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 11/2018 Zio monitor: Avg HR 57 (min 28). Intermittent Mobitz I.    Patient Active Problem List   Diagnosis Date Noted   Prediabetes 10/21/2022   Atrial fibrillation (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   Aortic calcification ([facility]) 06/15/2018   Adrenal adenoma 05/09/2017   Pulmonary nodules 04/19/2017   BPH (benign prostatic hyperplasia) 04/19/2017   Coronary artery disease of native artery of native heart with stable angina pectoris ([place]) 03/25/2016   Hyperlipidemia 07/07/2015   Essential hypertension 07/07/2015    Past Surgical History:  Procedure Laterality Date   CATARACT EXTRACTION W/PHACO Left 12/09/2020   Procedure: CATARACT EXTRACTION  PHACO AND INTRAOCULAR LENS PLACEMENT ([facility]) LEFT 6.94 01:09.7 9.9% ;  Surgeon: Leandrew Koyanagi, MD;  Location: [facility];  Service: Ophthalmology;  Laterality: Left;   CATARACT EXTRACTION W/PHACO Right 12/23/2020   Procedure: CATARACT EXTRACTION PHACO AND INTRAOCULAR LENS PLACEMENT ([place]) RIGHT MALYUGIN;  Surgeon: Leandrew Koyanagi, MD;  Location: [facility];  Service: Ophthalmology;  Laterality: Right;  8.44 1:14.6 11.3%   CHOLECYSTECTOMY N/A 09/13/2018   Procedure: LAPAROSCOPIC CHOLECYSTECTOMY;  Surgeon: Jules Husbands, MD;  Location: [facility];   Service: General;  Laterality: N/A;   COLONOSCOPY  2008   ESOPHAGOGASTRODUODENOSCOPY (EGD) WITH PROPOFOL N/A 08/20/2018   Procedure: ESOPHAGOGASTRODUODENOSCOPY (EGD) WITH PROPOFOL;  Surgeon: Lucilla Lame, MD;  Location: [facility];  Service: Endoscopy;  Laterality: N/A;   TONSILLECTOMY AND ADENOIDECTOMY  1955   TOTAL SHOULDER ARTHROPLASTY Left 09/11/2019   Procedure: TOTAL SHOULDER ARTHROPLASTY;  Surgeon: Lovell Sheehan, MD;  Location: [facility];  Service: Orthopedics;  Laterality: Left;   TOTAL SHOULDER ARTHROPLASTY Right 08/02/2021   Procedure: TOTAL SHOULDER ARTHROPLASTY;  Surgeon: Lovell Sheehan, MD;  Location: [facility];  Service: Orthopedics;  Laterality: Right;       Home Medications    Prior to Admission medications   Medication Sig Start Date End Date Taking? Authorizing Provider  doxycycline (VIBRAMYCIN) 100 MG capsule Take 1 capsule (100 mg total) by mouth 2 (two) times daily for 7 days. 11/27/22 12/04/22 Yes Sharion Balloon, NP  acetaminophen (TYLENOL) 500 MG tablet Take 1,000 mg by mouth every 6 (six) hours as needed for moderate pain.    [provider]  apixaban (ELIQUIS) 5 MG TABS tablet Take 1 tablet (5 mg total) by mouth 2 (two) times daily. 10/27/22   Deboraha Sprang, MD  Azelastine HCl 137 MCG/SPRAY SOLN PLACE 2 SPRAYS INTO BOTH NOSTRILS 2 (TWO) TIMES DAILY 05/10/22   Delsa Grana, PA-C  carbidopa-levodopa (SINEMET CR) 50-200 MG tablet Take 1 tablet by mouth at bedtime. 09/29/20 10/27/22  [provider]  carbidopa-levodopa (SINEMET IR) 25-100 MG tablet Take 2 tablets by mouth 3 (three) times daily.    [provider]  Cholecalciferol (VITAMIN D3 PO) Take 1 tablet by mouth daily.    [provider]  Clobetasol Prop Emollient Base (CLOBETASOL PROPIONATE E) 0.05 % emollient cream Apply 1 Application topically 2 (two) times daily. Up to 5 days per week prn to affected areas - avoid face, groin, underarms 10/26/22   Ralene Bathe, MD  docusate  sodium (COLACE) 100 MG capsule Take 1 capsule (100 mg total) by mouth 2 (two) times daily. 08/03/21   Carlynn Spry, PA-C  meloxicam (MOBIC) 15 MG tablet Take 15 mg by mouth daily.    [provider]  memantine (NAMENDA) 5 MG tablet Take 5 mg by mouth 2 (two) times daily. 08/15/22   [provider]  rOPINIRole (REQUIP) 1 MG tablet Take 1 mg by mouth 4 (four) times daily. 11/29/21   [provider]  rosuvastatin (CRESTOR) 10 MG tablet TAKE 1 TABLET BY MOUTH EVERY DAY 11/22/22   Delsa Grana, PA-C  sertraline (ZOLOFT) 100 MG tablet Take 100 mg by mouth daily. 09/11/20   [provider]  tamsulosin (FLOMAX) 0.4 MG CAPS capsule TAKE 1 CAPSULE BY MOUTH EVERYDAY AT BEDTIME 05/10/22   Delsa Grana, PA-C    Family History Family History  Problem Relation Age of Onset   Cancer Mother        lung   Heart disease Father  Hearing loss Sister        coclear implant   Hearing loss Brother    Cancer Paternal Grandmother    Cancer Son        colon cancer   Colon cancer Neg Hx    Stomach cancer Neg Hx     Social History Social History   Tobacco Use   Smoking status: Former    Packs/day: 1.50    Years: 35.00    Total pack years: 52.50    Types: Cigarettes    Quit date: 06/22/1992    Years since quitting: 30.4   Smokeless tobacco: Former    Quit date: 06/22/1992  Vaping Use   Vaping Use: Never used  Substance Use Topics   Alcohol use: Yes    Alcohol/week: 1.0 standard drink of alcohol    Types: 1 Cans of beer per week    Comment: occasional   Drug use: No     Allergies   Patient has no known allergies.   Review of Systems Review of Systems  Constitutional:  Negative for chills and fever.  Skin:  Positive for color change and wound.  Neurological:  Negative for weakness and numbness.  All other systems reviewed and are negative.    Physical Exam Triage Vital Signs ED Triage Vitals  Enc Vitals Group     BP --      Pulse Rate 11/27/22 0958  68     Resp 11/27/22 0958 18     Temp 11/27/22 0958 98.1 F (36.7 C)     Temp src --      SpO2 11/27/22 0958 96 %     Weight 11/27/22 1015 199 lb 15.3 oz (90.7 kg)     Height 11/27/22 1015 '5\' 10"'$  (1.778 m)     Head Circumference --      Peak Flow --      Pain Score 11/27/22 1015 4     Pain Loc --      Pain Edu? --      Excl. in [place]? --    No data found.  Updated Vital Signs BP (!) 130/58   Pulse 68   Temp 98.1 F (36.7 C)   Resp 18   Ht '5\' 10"'$  (1.778 m)   Wt 199 lb 15.3 oz (90.7 kg)   SpO2 96%   BMI 28.69 kg/m   Visual Acuity Right Eye Distance:   Left Eye Distance:   Bilateral Distance:    Right Eye Near:   Left Eye Near:    Bilateral Near:     Physical Exam Vitals and nursing note reviewed.  Constitutional:      General: He is not in acute distress.    Appearance: Normal appearance. He is well-developed. He is not ill-appearing.  HENT:     Head: Normocephalic and atraumatic.     Mouth/Throat:     Mouth: Mucous membranes are moist.  Eyes:     Conjunctiva/sclera: Conjunctivae normal.  Cardiovascular:     Rate and Rhythm: Normal rate and regular rhythm.     Heart sounds: Normal heart sounds.  Pulmonary:     Effort: Pulmonary effort is normal. No respiratory distress.     Breath sounds: Normal breath sounds.  Musculoskeletal:     Cervical back: Neck supple.  Skin:    General: Skin is warm and dry.     Capillary Refill: Capillary refill takes less than 2 seconds.     Findings: Erythema and lesion present.  Comments: Right thigh: area of erythema with central lesion; scant purulent drainage.  See picture.   Neurological:     Mental Status: He is alert.     Sensory: No sensory deficit.     Motor: No weakness.  Psychiatric:        Mood and Affect: Mood normal.        Behavior: Behavior normal.      UC Treatments / Results  Labs (all labs ordered are listed, but only abnormal results are displayed) Labs Reviewed - No data to  display  EKG   Radiology No results found.  Procedures Procedures (including critical care time)  Medications Ordered in UC Medications - No data to display  Initial Impression / Assessment and Plan / UC Course  I have reviewed the triage vital signs and the nursing notes.  Pertinent labs & imaging results that were available during my care of the patient were reviewed by me and considered in my medical decision making (see chart for details).   Cellulitis of right thigh due to wound infection.  He is afebrile and his vital signs are stable.  Treating with doxycycline.  Wound care instructions and signs of worsening infection discussed.  Instructed patient to follow-up with his PCP tomorrow.  ED precautions discussed.  Education provided on cellulitis.  Patient agrees to plan of care.   Final Clinical Impressions(s) / UC Diagnoses   Final diagnoses:  Cellulitis of right thigh  Wound infection     Discharge Instructions      Take the antibiotic as directed.  Follow-up with your primary care provider tomorrow.    Keep your wound clean and dry.  Wash it gently twice a day with soap and water.  Apply an antibiotic cream twice a day.    Go to the emergency department if you see signs of worsening infection, such as increased redness, red streaks, fever, chills, or other concerning symptoms.        ED Prescriptions     Medication Sig Dispense Auth. Provider   doxycycline (VIBRAMYCIN) 100 MG capsule Take 1 capsule (100 mg total) by mouth 2 (two) times daily for 7 days. 14 capsule Sharion Balloon, NP      PDMP not reviewed this encounter.   Sharion Balloon, NP 11/27/22 [phone]

## 2022-11-27 NOTE — ED Triage Notes (Signed)
Patient to Urgent Care with complaints of a sore present to his right knee.   Reports sore present to the inside of right thigh. Reports area has been growing in size, states it has had a white head on it. Reports some weeping but no purulent drainage. Reports area feels hard and abscessed.

## 2022-11-27 NOTE — Discharge Instructions (Addendum)
Take the antibiotic as directed.  Follow-up with your primary care provider tomorrow.    Keep your wound clean and dry.  Wash it gently twice a day with soap and water.  Apply an antibiotic cream twice a day.    Go to the emergency department if you see signs of worsening infection, such as increased redness, red streaks, fever, chills, or other concerning symptoms.

## 2022-12-01 ENCOUNTER — Encounter: Payer: Self-pay | Admitting: Family Medicine

## 2022-12-01 ENCOUNTER — Ambulatory Visit (INDEPENDENT_AMBULATORY_CARE_PROVIDER_SITE_OTHER): Payer: PPO | Admitting: Family Medicine

## 2022-12-01 VITALS — BP 126/64 | HR 57 | Temp 97.7°F | Resp 18 | Ht 70.0 in | Wt 205.0 lb

## 2022-12-01 DIAGNOSIS — L03115 Cellulitis of right lower limb: Secondary | ICD-10-CM | POA: Diagnosis not present

## 2022-12-01 DIAGNOSIS — L02415 Cutaneous abscess of right lower limb: Secondary | ICD-10-CM

## 2022-12-01 DIAGNOSIS — Z8614 Personal history of Methicillin resistant Staphylococcus aureus infection: Secondary | ICD-10-CM

## 2022-12-01 HISTORY — DX: Personal history of Methicillin resistant Staphylococcus aureus infection: Z86.14

## 2022-12-01 NOTE — Progress Notes (Signed)
Patient ID: Stephen Stein, male    DOB: [birth year], 81 y.o.   MRN: [id number]  PCP: Delsa Grana, PA-C  Chief Complaint  Patient presents with   Abscess    Subjective:   Stephen Stein is a 81 y.o. male, presents to clinic with CC of the following:  HPI  Bug bite/infection started about a week ago went to UC 4 d ago, he was given doxycycline and he is here for f/up The area of redness, swelling and severity of the pain have all gradually improved.  He did have a scab/sore that now is larger with multiple pustules with some clear drainage from the whole area. 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/20/2021   Parkinson's disease 10/20/2020   Carotid atherosclerosis, bilateral 02/20/2019   Vitamin B12 deficiency 10/15/2018   Left bundle branch block 06/25/2018   Second degree AV block, Mobitz type I 06/25/2018   Aortic calcification ([facility]) 06/15/2018   Adrenal adenoma 05/09/2017   Pulmonary nodules 04/19/2017   BPH (benign prostatic hyperplasia) 04/19/2017   Coronary artery disease of native artery of native heart with stable angina pectoris ([place]) 03/25/2016   Hyperlipidemia 07/07/2015   Essential hypertension 07/07/2015      Current Outpatient Medications:    acetaminophen (TYLENOL) 500 MG tablet, Take 1,000 mg by mouth every 6 (six) hours as needed for moderate pain., Disp: , Rfl:    apixaban (ELIQUIS) 5 MG TABS tablet, Take 1 tablet (5 mg total) by mouth 2 (two) times daily., Disp: ,  Rfl:    Azelastine HCl 137 MCG/SPRAY SOLN, PLACE 2 SPRAYS INTO BOTH NOSTRILS 2 (TWO) TIMES DAILY, Disp: 30 mL, Rfl: 3   carbidopa-levodopa (SINEMET IR) 25-100 MG tablet, Take 2 tablets by mouth 3 (three) times daily., Disp: , Rfl:    Cholecalciferol (VITAMIN D3 PO), Take 1 tablet by mouth daily., Disp: , Rfl:    Clobetasol Prop Emollient Base (CLOBETASOL PROPIONATE E) 0.05 % emollient cream, Apply 1 Application topically 2 (two) times daily. Up to 5 days per week prn to affected areas - avoid face, groin, underarms, Disp: 60 g, Rfl: 1   docusate sodium (COLACE) 100 MG capsule, Take 1 capsule (100 mg total) by mouth 2 (two) times daily., Disp: 30 capsule, Rfl: 0   doxycycline (VIBRAMYCIN) 100 MG capsule, Take 1 capsule (100 mg total) by mouth 2 (two) times daily for 7 days., Disp: 14 capsule, Rfl: 0   meloxicam (MOBIC) 15 MG tablet, Take 15 mg by mouth daily., Disp: , Rfl:    memantine (NAMENDA) 5 MG tablet, Take 5 mg by mouth 2 (two) times daily., Disp: , Rfl:    rOPINIRole (REQUIP) 1 MG tablet, Take 1 mg by mouth 4 (four) times daily., Disp: , Rfl:    rosuvastatin (CRESTOR) 10 MG tablet, TAKE 1 TABLET BY MOUTH EVERY DAY, Disp: 90 tablet, Rfl: 0   sertraline (ZOLOFT) 100 MG tablet, Take 100 mg by mouth daily.,  Disp: , Rfl:    carbidopa-levodopa (SINEMET CR) 50-200 MG tablet, Take 1 tablet by mouth at bedtime., Disp: , Rfl:    No Known Allergies   Social History   Tobacco Use   Smoking status: Former    Packs/day: 1.50    Years: 35.00    Total pack years: 52.50    Types: Cigarettes    Quit date: 06/22/1992    Years since quitting: 30.4   Smokeless tobacco: Former    Quit date: 06/22/1992  Vaping Use   Vaping Use: Never used  Substance Use Topics   Alcohol use: Yes    Alcohol/week: 1.0 standard drink of alcohol    Types: 1 Cans of beer per week    Comment: occasional   Drug use: No      Chart Review Today: I personally reviewed active problem list, medication list, allergies,  family history, social history, health maintenance, notes from last encounter, lab results, imaging with the patient/caregiver today.   Review of Systems  Constitutional: Negative.   HENT: Negative.    Eyes: Negative.   Respiratory: Negative.    Cardiovascular: Negative.   Gastrointestinal: Negative.   Endocrine: Negative.   Genitourinary: Negative.   Musculoskeletal: Negative.   Skin: Negative.   Allergic/Immunologic: Negative.   Neurological: Negative.   Hematological: Negative.   Psychiatric/Behavioral: Negative.    All other systems reviewed and are negative.      Objective:   Vitals:   12/01/22 1448  BP: 126/64  Pulse: (!) 57  Resp: 18  Temp: 97.7 F (36.5 C)  SpO2: 96%  Weight: 205 lb (93 kg)  Height: _0  (1.778 m)    Body mass index is 29.41 kg/m.  Physical Exam Vitals and nursing note reviewed.  Constitutional:      General: He is not in acute distress.    Appearance: Normal appearance. He is not ill-appearing, toxic-appearing or diaphoretic.  HENT:     Head: Normocephalic and atraumatic.  Cardiovascular:     Rate and Rhythm: Normal rate.  Skin:    General: Skin is warm.     Comments: Pustular and fluctuant wound roughly 3x2 cm to right inner thigh, slightly fluctuant and tender, with surrounding indurated and erythematous area more than 15x15cm non-tender Seeping serious fluid and friable/bleeding when touched or probed with wound culture No purulent discharge  Neurological:     Mental Status: He is alert. Mental status is at baseline.  Psychiatric:        Mood and Affect: Mood normal.        Behavior: Behavior normal.         I & D  Date/Time: 12/01/2022 3:45 PM  Performed by: Delsa Grana, PA-C Authorized by: Delsa Grana, PA-C   Consent:    Consent obtained:  Verbal   Consent given by:  Patient   Risks discussed:  Bleeding, pain and incomplete drainage   Alternatives discussed:  Observation and delayed treatment Location:     Type:  Abscess   Size:  3x2 cm area   Location:  Lower extremity   Lower extremity location:  Leg   Leg location:  R upper leg Pre-procedure details:    Skin preparation:  Povidone-iodine Sedation:    Sedation type:  None Anesthesia:    Anesthesia method:  None Procedure details:    Needle aspiration: yes     Needle size:  18 G   Incision types:  Stab incision   Incision depth:  Dermal  Wound management:  Debrided   Drainage:  Purulent   Drainage amount:  Scant   Wound treatment:  Wound left open   Packing materials:  None Post-procedure details:    Procedure completion:  Tolerated well, no immediate complications     Results for orders placed or performed in visit on 10/21/22  COMPLETE METABOLIC PANEL WITH GFR  Result Value Ref Range   Glucose, Bld 99 65 - 99 mg/dL   BUN 16 7 - 25 mg/dL   Creat 1.08 0.70 - 1.22 mg/dL   eGFR 69 > OR = 60 mL/min/1.20m   BUN/Creatinine Ratio SEE NOTE: 6 - 22 (calc)   Sodium 138 135 - 146 mmol/L   Potassium 4.6 3.5 - 5.3 mmol/L   Chloride 103 98 - 110 mmol/L   CO2 26 20 - 32 mmol/L   Calcium 9.7 8.6 - 10.3 mg/dL   Total Protein 7.1 6.1 - 8.1 g/dL   Albumin 4.2 3.6 - 5.1 g/dL   Globulin 2.9 1.9 - 3.7 g/dL (calc)   AG Ratio 1.4 1.0 - 2.5 (calc)   Total Bilirubin 0.4 0.2 - 1.2 mg/dL   Alkaline phosphatase (APISO) 41 35 - 144 U/L   AST 16 10 - 35 U/L   ALT 15 9 - 46 U/L  Hemoglobin A1c  Result Value Ref Range   Hgb A1c MFr Bld 5.8 (H) <5.7 % of total Hgb   Mean Plasma Glucose 120 mg/dL   eAG (mmol/L) 6.6 mmol/L  CBC with Differential/Platelet  Result Value Ref Range   WBC 7.3 3.8 - 10.8 Thousand/uL   RBC 4.06 (L) 4.20 - 5.80 Million/uL   Hemoglobin 12.5 (L) 13.2 - 17.1 g/dL   HCT 37.8 (L) 38.5 - 50.0 %   MCV 93.1 80.0 - 100.0 fL   MCH 30.8 27.0 - 33.0 pg   MCHC 33.1 32.0 - 36.0 g/dL   RDW 12.7 11.0 - 15.0 %   Platelets 289 140 - 400 Thousand/uL   MPV 9.7 7.5 - 12.5 fL   Neutro Abs 4,212 1,500 - 7,800 cells/uL   Lymphs Abs  1,759 850 - 3,900 cells/uL   Absolute Monocytes 664 200 - 950 cells/uL   Eosinophils Absolute 613 (H) 15 - 500 cells/uL   Basophils Absolute 51 0 - 200 cells/uL   Neutrophils Relative % 57.7 %   Total Lymphocyte 24.1 %   Monocytes Relative 9.1 %   Eosinophils Relative 8.4 %   Basophils Relative 0.7 %       Assessment & Plan:     ICD-10-CM   1. Cellulitis and abscess of right leg  L03.115 Wound culture   L02.415    no area amenable to I&D after attempting to aspirate and debride with 18g/syringe and forceps    F/up from UC visit, he notes significant improvement with pain and the size and extent of redness and swelling has decreased since starting antibiotics. (It does not appear so when comparing the photo from today to the photo from UC visit, but pt is adamant its feeling much better, just the central area looks yuckier)  Plan to continue doxy, add frequent warm compresses, and monitor border of erythema marked today with a pen. Will call with with culture results and adjust/extend antibiotics at that time (over weekend discussed plan and resources available) Encouraged to [place] away if any worsening.  ED if rapid worsening or constitutional sx  Pt has no constitutional sx, infection has been improving - feel it is  appropriate to continue abx for now and f/up in 2-3 d       Delsa Grana, PA-C 12/01/22 3:06 PM

## 2022-12-02 ENCOUNTER — Other Ambulatory Visit: Payer: Self-pay | Admitting: Family Medicine

## 2022-12-02 DIAGNOSIS — L02415 Cutaneous abscess of right lower limb: Secondary | ICD-10-CM

## 2022-12-02 MED ORDER — DOXYCYCLINE HYCLATE 100 MG PO TABS: 100.0000 mg | ORAL_TABLET | Freq: Two times a day (BID) | ORAL | 0 refills | Status: AC

## 2022-12-02 MED ORDER — CEPHALEXIN 500 MG PO CAPS: 500.0000 mg | ORAL_CAPSULE | Freq: Two times a day (BID) | ORAL | 0 refills | Status: DC

## 2022-12-04 LAB — WOUND CULTURE
MICRO NUMBER:: 14289526
SPECIMEN QUALITY:: ADEQUATE

## 2022-12-05 DIAGNOSIS — M19072 Primary osteoarthritis, left ankle and foot: Secondary | ICD-10-CM | POA: Diagnosis not present

## 2022-12-05 NOTE — Progress Notes (Signed)
The appointment that is scheduled for tomorrow is a virtual visit and the one with Junie Panning is his medicare wellness. Are you able to do a regular appt and medicare wellness together?

## 2022-12-06 ENCOUNTER — Telehealth: Payer: PPO | Admitting: Family Medicine

## 2022-12-06 DIAGNOSIS — L02415 Cutaneous abscess of right lower limb: Secondary | ICD-10-CM

## 2022-12-06 DIAGNOSIS — A4902 Methicillin resistant Staphylococcus aureus infection, unspecified site: Secondary | ICD-10-CM

## 2022-12-06 MED ORDER — DOXYCYCLINE HYCLATE 100 MG PO TABS: 100.0000 mg | ORAL_TABLET | Freq: Two times a day (BID) | ORAL | 0 refills | Status: AC

## 2022-12-06 NOTE — Progress Notes (Signed)
Name: Stephen Stein   MRN: [id number]    DOB: 1941-07-26   Date:12/06/2022       Progress Note  Subjective:    Chief Complaint  Chief Complaint  Patient presents with   Results    MRSA    I connected with  Stephen Stein  on 12/06/22 at 11:40 AM EST by a video enabled telemedicine application and verified that I am speaking with the correct person using two identifiers.  I discussed the limitations of evaluation and management by telemedicine and the availability of in person appointments. The patient expressed understanding and agreed to proceed. Staff also discussed with the patient that there may be a patient responsible charge related to this service. Patient Location: home Provider Location: [facility] Additional Individuals present: none  HPI Follow-up on abscess/cellulitis to right inner thigh The wound culture did come back as MRSA and it was reviewed with the patient today at length  He has continued on doxycycline but will be out of medications in a few days Overall he has had decreased swelling and redness the area is getting smaller though he continues to have the central pustular area still swollen and not draining purulence he is having some weeping of serosanguineous fluid only but overall notes a significant improvement on his discomfort He is tolerating the antibiotics without any concerning GI side effects, and he has continued to do warm compresses twice daily with the assistance from his wife  Patient Active Problem List   Diagnosis Date Noted   Prediabetes 10/21/2022   Atrial fibrillation ([place]) 10/21/2022   RLS (restless legs syndrome) 10/21/2022   Neuropathy 10/21/2022   At high risk for falls 10/21/2022   Allergic rhinitis 10/21/2021   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[place]) 04/20/2021   Parkinson's disease 10/20/2020   Carotid atherosclerosis, bilateral 02/20/2019   Vitamin B12 deficiency  10/15/2018   Left bundle branch block 06/25/2018   Second degree AV block, Mobitz type I 06/25/2018   Aortic calcification ([place]) 06/15/2018   Adrenal adenoma 05/09/2017   Pulmonary nodules 04/19/2017   BPH (benign prostatic hyperplasia) 04/19/2017   Coronary artery disease of native artery of native heart with stable angina pectoris ([place]) 03/25/2016   Hyperlipidemia 07/07/2015   Essential hypertension 07/07/2015    Social History   Tobacco Use   Smoking status: Former    Packs/day: 1.50    Years: 35.00    Total pack years: 52.50    Types: Cigarettes    Quit date: 06/22/1992    Years since quitting: 30.4   Smokeless tobacco: Former    Quit date: 06/22/1992  Substance Use Topics   Alcohol use: Yes    Alcohol/week: 1.0 standard drink of alcohol    Types: 1 Cans of beer per week    Comment: occasional     Current Outpatient Medications:    acetaminophen (TYLENOL) 500 MG tablet, Take 1,000 mg by mouth every 6 (six) hours as needed for moderate pain., Disp: , Rfl:    apixaban (ELIQUIS) 5 MG TABS tablet, Take 1 tablet (5 mg total) by mouth 2 (two) times daily., Disp: , Rfl:    Azelastine HCl 137 MCG/SPRAY SOLN, PLACE 2 SPRAYS INTO BOTH NOSTRILS 2 (TWO) TIMES DAILY, Disp: 30 mL, Rfl: 3   carbidopa-levodopa (SINEMET IR) 25-100 MG tablet, Take 2 tablets by mouth 3 (three) times daily., Disp: , Rfl:    Cholecalciferol (VITAMIN D3 PO), Take 1  tablet by mouth daily., Disp: , Rfl:    Clobetasol Prop Emollient Base (CLOBETASOL PROPIONATE E) 0.05 % emollient cream, Apply 1 Application topically 2 (two) times daily. Up to 5 days per week prn to affected areas - avoid face, groin, underarms, Disp: 60 g, Rfl: 1   docusate sodium (COLACE) 100 MG capsule, Take 1 capsule (100 mg total) by mouth 2 (two) times daily., Disp: 30 capsule, Rfl: 0   doxycycline (VIBRA-TABS) 100 MG tablet, Take 1 tablet (100 mg total) by mouth 2 (two) times daily for 3 days., Disp: 6 tablet, Rfl: 0   meloxicam (MOBIC) 15 MG  tablet, Take 15 mg by mouth daily., Disp: , Rfl:    memantine (NAMENDA) 5 MG tablet, Take 5 mg by mouth 2 (two) times daily., Disp: , Rfl:    rOPINIRole (REQUIP) 1 MG tablet, Take 1 mg by mouth 4 (four) times daily., Disp: , Rfl:    rosuvastatin (CRESTOR) 10 MG tablet, TAKE 1 TABLET BY MOUTH EVERY DAY, Disp: 90 tablet, Rfl: 0   sertraline (ZOLOFT) 100 MG tablet, Take 100 mg by mouth daily., Disp: , Rfl:    carbidopa-levodopa (SINEMET CR) 50-200 MG tablet, Take 1 tablet by mouth at bedtime., Disp: , Rfl:    cephALEXin (KEFLEX) 500 MG capsule, Take 1 capsule (500 mg total) by mouth 2 (two) times daily for 5 days. (Patient not taking: Reported on 12/06/2022), Disp: 10 capsule, Rfl: 0  No Known Allergies  I personally reviewed active problem list, medication list, allergies, family history, social history, health maintenance, notes from last encounter, lab results, imaging with the patient/caregiver today.   Review of Systems  Constitutional: Negative.   HENT: Negative.    Eyes: Negative.   Respiratory: Negative.    Cardiovascular: Negative.   Gastrointestinal: Negative.   Endocrine: Negative.   Genitourinary: Negative.   Musculoskeletal: Negative.   Skin: Negative.   Allergic/Immunologic: Negative.   Neurological: Negative.   Hematological: Negative.   Psychiatric/Behavioral: Negative.    All other systems reviewed and are negative.     Objective:   Virtual encounter, vitals limited, only able to obtain the following There were no vitals filed for this visit. There is no height or weight on file to calculate BMI. Nursing Note and Vital Signs reviewed.  Physical Exam Constitutional:      Appearance: He is obese.  Skin:    Comments: Able to visualize the right inner thigh with appreciable decrease in edema his skin is softer though there is some redness surrounding his pustular abscess which appears overall decreased in size compared to last office visit.  He shows me gauze with  clear bloody drainage.  He pinches around the pustular area which still shows some indurated swollen area surrounding the roughly 3 cm diameter raw pustular area  Neurological:     Comments: Seated in wheelchair  Psychiatric:        Mood and Affect: Mood normal.     PE limited by virtual encounter  No results found for this or any previous visit (from the past 72 hour(s)).  Assessment and Plan:     ICD-10-CM   1. Cellulitis and abscess of right leg  L03.115 Ambulatory referral to General Surgery   L02.415 doxycycline (VIBRA-TABS) 100 MG tablet    2. MRSA (methicillin resistant Staphylococcus aureus) infection  A49.02 Ambulatory referral to General Surgery    doxycycline (VIBRA-TABS) 100 MG tablet     Plan to continue meds and warm compresses for now, I  feel that he may need general surgery or wound care to help reassess him - after discussion with pt we will see if our colleagues next door at [facility] can have him evaluated in their office and I am just concerned that he still may need a more complex I&D -with the pustular area visible but not draining and I could not get a drain in office.   I have sent in a few more days of doxycycline to cover him but hopefully that we will recheck him this week or early next week  Overall the cellulitis has significantly improved   -Red flags and when to present for emergency care or RTC including fever >101.72F, chest pain, shortness of breath, new/worsening/un-resolving symptoms, reviewed with patient at time of visit. Follow up and care instructions discussed and provided in AVS. - I discussed the assessment and treatment plan with the patient. The patient was provided an opportunity to ask questions and all were answered. The patient agreed with the plan and demonstrated an understanding of the instructions.  I provided 18 minutes of non-face-to-face time during this encounter.  Delsa Grana, PA-C 12/06/22 6:07 PM

## 2022-12-07 ENCOUNTER — Ambulatory Visit (INDEPENDENT_AMBULATORY_CARE_PROVIDER_SITE_OTHER): Payer: PPO | Admitting: Physician Assistant

## 2022-12-07 ENCOUNTER — Encounter: Payer: Self-pay | Admitting: Physician Assistant

## 2022-12-07 DIAGNOSIS — M19072 Primary osteoarthritis, left ankle and foot: Secondary | ICD-10-CM | POA: Diagnosis not present

## 2022-12-07 DIAGNOSIS — Z0000 Encounter for general adult medical examination without abnormal findings: Secondary | ICD-10-CM

## 2022-12-07 NOTE — Progress Notes (Signed)
Virtual Visit via Telephone Note  I connected with Stephen Stein on 12/07/22 at 10:20 AM  by telephone and verified that I am speaking with the correct person using two identifiers.  Location: Patient: At home, [place], [place]  Provider: [place], [place]     I discussed the limitations, risks, security and privacy concerns of performing an evaluation and management service by telephone and the availability of in person appointments. The patient expressed understanding and agreed to proceed.  Interactive audio and video telecommunications were attempted between this nurse and patient, however failed, due to patient having technical difficulties OR patient did not have access to video capability.  We continued and completed visit with audio only.  Some vital signs may be absent or patient reported.  Today's Provider: Talitha Stein, MHS, PA-C Introduced myself to the patient as a PA-C and provided education on APPs in clinical practice.     Subjective:   Stephen Stein is a 81 y.o. male who presents for Medicare Annual/Subsequent preventive examination.  Review of Systems:         Objective:    Vitals: There were no vitals taken for this visit.  There is no height or weight on file to calculate BMI.     11/27/2022   10:17 AM 12/07/2021    3:56 PM 08/02/2021    3:45 PM 12/23/2020   10:29 AM 12/09/2020    7:55 AM 12/03/2020    3:48 PM 09/11/2019    7:11 AM  Advanced Directives  Does Patient Have a Medical Advance Directive? Yes Yes No Yes Yes Yes Yes  Type of Paramedic of [place];Living will [place];Living will  [place];Living will [place];Living will [place];Living will [place];Living will  Does patient want to make changes to medical advance directive?    No - Patient declined No - Patient declined  No - Patient declined  Copy  of [place] in Chart?  Yes - validated most recent copy scanned in chart (See row information)  No - copy requested Yes - validated most recent copy scanned in chart (See row information) Yes - validated most recent copy scanned in chart (See row information) No - copy requested  Would patient like information on creating a medical advance directive?   No - Patient declined        Tobacco Social History   Tobacco Use  Smoking Status Former   Packs/day: 1.50   Years: 35.00   Total pack years: 52.50   Types: Cigarettes   Quit date: 06/22/1992   Years since quitting: 30.4  Smokeless Tobacco Former   Quit date: 06/22/1992     Counseling given: Not Answered   Clinical Intake:  Pre-visit preparation completed: Yes  Pain : No/denies pain Pain Score: 0-No pain     Nutritional Status: BMI 25 -29 Overweight Nutritional Risks: None Diabetes: No  How often do you need to have someone help you when you read instructions, pamphlets, or other written materials from your doctor or pharmacy?: 1 - Never What is the last grade level you completed in school?: Bachelors Degree  Interpreter Needed?: No     Past Medical History:  Diagnosis Date   Adrenal adenoma 05/09/2017   2 cm on scan May 2018; refer to endo   Aortic atherosclerosis ([place])    Biliary dyskinesia    a. 08/2018 s/p lap chole.  BPH (benign prostatic hyperplasia)    Carotid atherosclerosis, bilateral    a. 01/2019 Carotid U/S: <50% bilat ICA stenosis.   Constipation 10/21/2021   Coronary artery calcification seen on CT scan    a. 11/2015 Cardiac CT: Cor Ca2+ = 892 (80th %'ile). Diffuse Ca2+ in all 3 major coronary arteries; b. 02/2016 MV: HTN response to exercise. Abnl ecg w/ horizontal inflat ST depression, but no ischemia/infarct on imaging; c. 09/2018 MV: EF 64%, no ischemia. Very sm region of mild distal antsept defect, likely 2/2 lbbb.   Depression    Grade I diastolic dysfunction    Hepatic  steatosis    Hx of basal cell carcinoma 08/01/2019   R chest parasternal   Hyperlipidemia    Hypertension    Irregular heartbeat/Palpitations    a. Has been taking Atenolol for 20 years   LBBB (left bundle branch block)    Left ventricular hypokinesis    a. 09/2018 Echo: EF 45-50%, mod LVH, diff HK. Gr1 DD. Triv AI. Mildly dil Ao root. Mildly reduced RV fxn.   Lightheadedness    Mild cognitive impairment    NSVT (nonsustained ventricular tachycardia) ([place])    a. 11/2018 Zio monitor - 13 beats of NSVT--asymptomatic.   Orthostatic hypotension    a. 11/2018 pressures improved w/ midodrine/florinef.   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 11/2018 Zio monitor: Avg HR 57 (min 28). Intermittent Mobitz I.   Past Surgical History:  Procedure Laterality Date   CATARACT EXTRACTION W/PHACO Left 12/09/2020   Procedure: CATARACT EXTRACTION PHACO AND INTRAOCULAR LENS PLACEMENT (IOC) LEFT 6.94 01:09.7 9.9% ;  Surgeon: Leandrew Koyanagi, MD;  Location: [facility];  Service: Ophthalmology;  Laterality: Left;   CATARACT EXTRACTION W/PHACO Right 12/23/2020   Procedure: CATARACT EXTRACTION PHACO AND INTRAOCULAR LENS PLACEMENT ([place]) RIGHT MALYUGIN;  Surgeon: Leandrew Koyanagi, MD;  Location: [facility];  Service: Ophthalmology;  Laterality: Right;  8.44 1:14.6 11.3%   CHOLECYSTECTOMY N/A 09/13/2018   Procedure: LAPAROSCOPIC CHOLECYSTECTOMY;  Surgeon: Jules Husbands, MD;  Location: [facility];  Service: General;  Laterality: N/A;   COLONOSCOPY  2008   ESOPHAGOGASTRODUODENOSCOPY (EGD) WITH PROPOFOL N/A 08/20/2018   Procedure: ESOPHAGOGASTRODUODENOSCOPY (EGD) WITH PROPOFOL;  Surgeon: Lucilla Lame, MD;  Location: [facility];  Service: Endoscopy;  Laterality: N/A;   TONSILLECTOMY AND ADENOIDECTOMY  1955   TOTAL SHOULDER ARTHROPLASTY Left 09/11/2019   Procedure: TOTAL SHOULDER ARTHROPLASTY;  Surgeon: Lovell Sheehan,  MD;  Location: [facility];  Service: Orthopedics;  Laterality: Left;   TOTAL SHOULDER ARTHROPLASTY Right 08/02/2021   Procedure: TOTAL SHOULDER ARTHROPLASTY;  Surgeon: Lovell Sheehan, MD;  Location: [facility];  Service: Orthopedics;  Laterality: Right;   Family History  Problem Relation Age of Onset   Cancer Mother        lung   Heart disease Father    Hearing loss Sister        coclear implant   Hearing loss Brother    Cancer Paternal Grandmother    Cancer Son        colon cancer   Colon cancer Neg Hx    Stomach cancer Neg Hx    Social History   Socioeconomic History   Marital status: Married    Spouse name: Not on file   Number of children: 2   Years of education: Not on file   Highest education level: Bachelor's degree (e.g., BA, AB, BS)  Occupational History   Not on file  Tobacco Use   Smoking status: Former    Packs/day: 1.50    Years: 35.00    Total pack years: 52.50    Types: Cigarettes    Quit date: 06/22/1992    Years since quitting: 30.4   Smokeless tobacco: Former    Quit date: 06/22/1992  Vaping Use   Vaping Use: Never used  Substance and Sexual Activity   Alcohol use: Yes    Alcohol/week: 1.0 standard drink of alcohol    Types: 1 Cans of beer per week    Comment: occasional   Drug use: No   Sexual activity: Not Currently  Other Topics Concern   Not on file  Social History Narrative   Not on file   Social Determinants of Health   Financial Resource Strain: Low Risk  (12/07/2021)   Overall Financial Resource Strain (CARDIA)    Difficulty of Paying Living Expenses: Not hard at all  Food Insecurity: No Food Insecurity (12/07/2021)   Hunger Vital Sign    Worried About Running Out of Food in the Last Year: Never true    Ran Out of Food in the Last Year: Never true  Transportation Needs: No Transportation Needs (12/07/2021)   PRAPARE - Hydrologist (Medical): No    Lack of Transportation (Non-Medical): No  Physical  Activity: Sufficiently Active (12/07/2021)   Exercise Vital Sign    Days of Exercise per Week: 5 days    Minutes of Exercise per Session: 30 min  Stress: No Stress Concern Present (12/07/2021)   Stout    Feeling of Stress : Not at all  Social Connections: Moderately Integrated (12/07/2021)   Social Connection and Isolation Panel [NHANES]    Frequency of Communication with Friends and Family: More than three times a week    Frequency of Social Gatherings with Friends and Family: Three times a week    Attends Religious Services: More than 4 times per year    Active Member of Clubs or Organizations: No    Attends Archivist Meetings: Never    Marital Status: Married    Outpatient Encounter Medications as of 12/07/2022  Medication Sig   acetaminophen (TYLENOL) 500 MG tablet Take 1,000 mg by mouth every 6 (six) hours as needed for moderate pain.   apixaban (ELIQUIS) 5 MG TABS tablet Take 1 tablet (5 mg total) by mouth 2 (two) times daily.   Azelastine HCl 137 MCG/SPRAY SOLN PLACE 2 SPRAYS INTO BOTH NOSTRILS 2 (TWO) TIMES DAILY   carbidopa-levodopa (SINEMET IR) 25-100 MG tablet Take 2 tablets by mouth 3 (three) times daily.   Cholecalciferol (VITAMIN D3 PO) Take 1 tablet by mouth daily.   Clobetasol Prop Emollient Base (CLOBETASOL PROPIONATE E) 0.05 % emollient cream Apply 1 Application topically 2 (two) times daily. Up to 5 days per week prn to affected areas - avoid face, groin, underarms   docusate sodium (COLACE) 100 MG capsule Take 1 capsule (100 mg total) by mouth 2 (two) times daily.   doxycycline (VIBRA-TABS) 100 MG tablet Take 1 tablet (100 mg total) by mouth 2 (two) times daily for 3 days.   [START ON 12/08/2022] doxycycline (VIBRA-TABS) 100 MG tablet Take 1 tablet (100 mg total) by mouth 2 (two) times daily for 5 days.   meloxicam (MOBIC) 15 MG tablet Take 15 mg by mouth daily.   memantine (NAMENDA) 5  MG tablet  Take 5 mg by mouth 2 (two) times daily.   rOPINIRole (REQUIP) 1 MG tablet Take 1 mg by mouth 4 (four) times daily.   rosuvastatin (CRESTOR) 10 MG tablet TAKE 1 TABLET BY MOUTH EVERY DAY   sertraline (ZOLOFT) 100 MG tablet Take 100 mg by mouth daily.   carbidopa-levodopa (SINEMET CR) 50-200 MG tablet Take 1 tablet by mouth at bedtime.   No facility-administered encounter medications on file as of 12/07/2022.    Activities of Daily Living    12/07/2022    9:35 AM 12/06/2022   10:04 AM  In your present state of health, do you have any difficulty performing the following activities:  Hearing? 1 1  Vision? 0 0  Difficulty concentrating or making decisions? 1 1  Walking or climbing stairs? 1 1  Dressing or bathing? 0 0  Doing errands, shopping? 0 0    Patient Care Team: Delsa Grana, PA-C as PCP - General (Family Medicine) Minna Merritts, MD as Consulting Physician (Cardiology) Vladimir Crofts, MD as Consulting Physician (Neurology) Ralene Bathe, MD (Dermatology)   Assessment:   This is a routine wellness examination for Diamante.  Exercise Activities and Dietary recommendations     Goals Addressed   None     Fall Risk:    12/07/2022    9:34 AM 12/06/2022   10:04 AM 12/01/2022    2:49 PM 10/21/2022    8:50 AM 05/10/2022    1:07 PM  Fall Risk   Falls in the past year? '1 1 1 1 1  '$ Number falls in past yr: '1 1 1 1 1  '$ Injury with Fall? '1 1 1 1 '$ 0  Risk for fall due to : Impaired balance/gait Impaired balance/gait Impaired balance/gait;Impaired mobility History of fall(s);Impaired balance/gait No Fall Risks  Follow up Falls prevention discussed;Education provided;Falls evaluation completed Falls prevention discussed;Education provided;Falls evaluation completed  Falls prevention discussed;Education provided;Falls evaluation completed Falls prevention discussed    FALL RISK PREVENTION PERTAINING TO THE HOME:  Any stairs in or around the home? Yes  If so, are  there any without handrails? Yes   Home free of loose throw rugs in walkways, pet beds, electrical cords, etc? Yes  Adequate lighting in your home to reduce risk of falls? Yes   ASSISTIVE DEVICES UTILIZED TO PREVENT FALLS:  Life alert? No  Use of a cane, walker or w/c? Yes - uses a walker due to fractured leg  Grab bars in the bathroom? Yes  Shower chair or bench in shower? Yes  Elevated toilet seat or a handicapped toilet? Yes   TIMED UP AND GO:  Was the test performed? No Telephone visit.  Length of time to ambulate 10 feet: 0 sec.     Depression Screen    12/07/2022    9:35 AM 12/06/2022   10:04 AM 12/01/2022    2:55 PM 10/21/2022    8:50 AM  PHQ 2/9 Scores  PHQ - 2 Score 0 0 0 0  PHQ- 9 Score 0 0 0 0    Cognitive Function    04/22/2022   10:33 AM  MMSE - Mini Mental State Exam  Orientation to time 5  Orientation to Place 5  Registration 3  Attention/ Calculation 5  Recall 1  Language- name 2 objects 2  Language- repeat 1  Language- follow 3 step command 3  Language- read & follow direction 1  Write a sentence 1  Copy design 1  Total score 28  07/04/2019    8:34 AM 10/12/2018    8:30 AM  6CIT Screen  What Year? 0 points 4 points  What month? 0 points 0 points  What time? 0 points 0 points  Count back from 20 0 points 0 points  Months in reverse 2 points 4 points  Repeat phrase 2 points 4 points  Total Score 4 points 12 points    Immunization History  Administered Date(s) Administered   Influenza, High Dose Seasonal PF 09/16/2017, 09/24/2018, 08/29/2019, 09/29/2020, 09/22/2021, 09/20/2022   Influenza-Unspecified 08/26/2014, 09/22/2015   Moderna Covid-19 Vaccine Bivalent Booster 59yr & up 10/04/2021   Moderna Sars-Covid-2 Vaccination 01/07/2020, 02/22/2020, 10/19/2020   Pneumococcal Conjugate-13 11/24/2014, 09/22/2015   Pneumococcal Polysaccharide-23 09/24/2018   Tdap 06/21/2011   Zoster Recombinat (Shingrix) 01/21/2021    Qualifies for  Shingles Vaccine? Yes  Zoster 1st dose completed 01/21/2021. Due for 2nd dose Shingrix. Education has been provided regarding the importance of this vaccine. Pt has been advised to call insurance company to determine out of pocket expense. Advised may also receive vaccine at local pharmacy or Health Dept. Verbalized acceptance and understanding.  Tdap: Although this vaccine is not a covered service during a Wellness Exam, does the patient still wish to receive this vaccine today?  yES.  Education has been provided regarding the importance of this vaccine. Advised may receive this vaccine at local pharmacy or Health Dept. Aware to provide a copy of the vaccination record if obtained from local pharmacy or Health Dept. Verbalized acceptance and understanding.  Flu Vaccine: Completed 09/20/2022  Pneumococcal Vaccine: Completed Vaccines  Covid-19 Vaccine: Completed vaccines  Screening Tests Health Maintenance  Topic Date Due   Medicare Annual Wellness (AWV)  12/07/2022   COVID-19 Vaccine (5 - 2023-24 season) 12/17/2022 (Originally 08/26/2022)   Zoster Vaccines- Shingrix (2 of 2) 01/21/2023 (Originally 03/18/2021)   Pneumonia Vaccine 80 Years old  Completed   INFLUENZA VACCINE  Completed   HPV VACCINES  Aged Out   DTaP/Tdap/Td  Discontinued   Cancer Screenings:  Colorectal Screening:  No longer required.   Lung Cancer Screening: (Low Dose CT Chest recommended if Age 80-80years, 30 pack-year currently smoking OR have quit w/in 15years.) does not qualify.    Additional Screening:  Hepatitis C Screening: does not qualify  Vision Screening: Recommended annual ophthalmology exams for early detection of glaucoma and other disorders of the eye. Is the patient up to date with their annual eye exam?  Yes  Who is the provider or what is the name of the office in which the pt attends annual eye exams?  eye center  Dental Screening: Recommended annual dental exams for proper oral  hygiene  Community Resource Referral:  CRR required this visit?  No        Plan:  I have personally reviewed and addressed the Medicare Annual Wellness questionnaire and have noted the following in the patient's chart:  A. Medical and social history B. Use of alcohol, tobacco or illicit drugs  C. Current medications and supplements D. Functional ability and status E.  Nutritional status F.  Physical activity G. Advance directives H. List of other physicians I.  Hospitalizations, surgeries, and ER visits in previous 12 months J.  [place] as hearing and vision if needed, cognitive and depression L. Referrals and appointments   In addition, I have reviewed and discussed with patient certain preventive protocols, quality metrics, and best practice recommendations. A written personalized care plan for preventive services as well  as general preventive health recommendations were provided to patient.   Signed,   Stephen Stein, MHS, PA-C [facility]

## 2022-12-08 ENCOUNTER — Ambulatory Visit: Payer: PPO

## 2022-12-08 ENCOUNTER — Ambulatory Visit (INDEPENDENT_AMBULATORY_CARE_PROVIDER_SITE_OTHER): Payer: PPO | Admitting: Surgery

## 2022-12-08 ENCOUNTER — Encounter: Payer: Self-pay | Admitting: Surgery

## 2022-12-08 ENCOUNTER — Ambulatory Visit: Payer: PPO | Admitting: Surgery

## 2022-12-08 VITALS — BP 136/53 | HR 56 | Temp 97.6°F | Ht 70.0 in | Wt 208.6 lb

## 2022-12-08 DIAGNOSIS — Z1610 Resistance to unspecified beta lactam antibiotics: Secondary | ICD-10-CM

## 2022-12-08 DIAGNOSIS — S71001A Unspecified open wound, right hip, initial encounter: Secondary | ICD-10-CM

## 2022-12-08 DIAGNOSIS — S71101A Unspecified open wound, right thigh, initial encounter: Secondary | ICD-10-CM

## 2022-12-08 DIAGNOSIS — B9562 Methicillin resistant Staphylococcus aureus infection as the cause of diseases classified elsewhere: Secondary | ICD-10-CM | POA: Diagnosis not present

## 2022-12-08 DIAGNOSIS — L089 Local infection of the skin and subcutaneous tissue, unspecified: Secondary | ICD-10-CM

## 2022-12-08 DIAGNOSIS — M19079 Primary osteoarthritis, unspecified ankle and foot: Secondary | ICD-10-CM | POA: Insufficient documentation

## 2022-12-08 NOTE — Patient Instructions (Addendum)
If you have any concerns or questions, please feel free to call our office. See follow up appointment below.  Excision of Skin Lesions, Care After The following information offers guidance on how to care for yourself after your procedure. Your health care provider may also give you more specific instructions. If you have problems or questions, contact your health care provider. What can I expect after the procedure? After your procedure, it is common to have: Soreness or mild pain. Some redness and swelling. Follow these instructions at home: Excision site care  Follow instructions from your health care provider about how to take care of your excision site. Make sure you: Wash your hands with soap and water for at least 20 seconds before and after you change your bandage (dressing). If soap and water are not available, use hand sanitizer. Change your dressing as told by your health care provider. Leave stitches (sutures), skin glue, or adhesive strips in place. These skin closures may need to stay in place for 2 weeks or longer. If adhesive strip edges start to loosen and curl up, you may trim the loose edges. Do not remove adhesive strips completely unless your health care provider tells you to do that. Check the excision area every day for signs of infection. Watch for: More redness, swelling, or pain. Fluid or blood. Warmth. Pus or a bad smell. Keep the site clean, dry, and protected for at least 48 hours. For bleeding, apply gentle but firm pressure to the area using a folded towel for 20 minutes. Do not take baths, swim, or use a hot tub until your health care provider approves. Ask your health care provider if you may take showers. You may only be allowed to take sponge baths. General instructions Take over-the-counter and prescription medicines only as told by your health care provider. Follow instructions from your health care provider about how to minimize scarring. Scarring should  lessen over time. Avoid sun exposure until the area has healed. Use sunscreen to protect the area from the sun after it has healed. Avoid high-impact exercise and activities until the sutures are removed or the area heals. Keep all follow-up visits. This is important. Contact a health care provider if: You have more redness, swelling, or pain around your excision site. You have fluid or blood coming from your excision site. Your excision site feels warm to the touch. You have pus or a bad smell coming from your excision site. You have a fever. You have pain that does not improve in 2-3 days after your procedure. Get help right away if: You have bleeding that does not stop with pressure or a dressing. Your wound opens up. Summary Take over-the-counter and prescription medicines only as told by your health care provider. Change your dressing as told by your health care provider. Contact a health care provider if you have redness, swelling, pain, or other signs of infection around your excision site. Keep all follow-up visits. This is important. This information is not intended to replace advice given to you by your health care provider. Make sure you discuss any questions you have with your health care provider. Document Revised: 07/13/2021 Document Reviewed: 07/13/2021 Elsevier Patient Education  2023 Elsevier Inc.  

## 2022-12-09 DIAGNOSIS — S71001A Unspecified open wound, right hip, initial encounter: Secondary | ICD-10-CM | POA: Insufficient documentation

## 2022-12-09 DIAGNOSIS — L089 Local infection of the skin and subcutaneous tissue, unspecified: Secondary | ICD-10-CM | POA: Insufficient documentation

## 2022-12-09 NOTE — Progress Notes (Signed)
Patient ID: Stephen Stein, male   DOB: [birth year], 81 y.o.   MRN: [id number]  Chief Complaint: Right medial thigh carbuncle, with MRSA infection.  History of Present Illness Stephen Stein is a 81 y.o. male with a gradual recovery from the above, presenting with cellulitis, drainage from multiple clustered foci of the medial right thigh.  Currently has residual thickened portion with slough of compromised dermis present.  No active cellulitic changes surrounding the area.  He denies fevers and chills.  Currently completing his course of antibiotics.  Past Medical History Past Medical History:  Diagnosis Date   Adrenal adenoma 05/09/2017   2 cm on scan May 2018; refer to endo   Aortic atherosclerosis ([place])    Biliary dyskinesia    a. 08/2018 s/p lap chole.   BPH (benign prostatic hyperplasia)    Carotid atherosclerosis, bilateral    a. 01/2019 Carotid U/S: <50% bilat ICA stenosis.   Constipation 10/21/2021   Coronary artery calcification seen on CT scan    a. 11/2015 Cardiac CT: Cor Ca2+ = 892 (80th %'ile). Diffuse Ca2+ in all 3 major coronary arteries; b. 02/2016 MV: HTN response to exercise. Abnl ecg w/ horizontal inflat ST depression, but no ischemia/infarct on imaging; c. 09/2018 MV: EF 64%, no ischemia. Very sm region of mild distal antsept defect, likely 2/2 lbbb.   Depression    Grade I diastolic dysfunction    Hepatic steatosis    Hx of basal cell carcinoma 08/01/2019   R chest parasternal   Hyperlipidemia    Hypertension    Irregular heartbeat/Palpitations    a. Has been taking Atenolol for 20 years   LBBB (left bundle branch block)    Left ventricular hypokinesis    a. 09/2018 Echo: EF 45-50%, mod LVH, diff HK. Gr1 DD. Triv AI. Mildly dil Ao root. Mildly reduced RV fxn.   Lightheadedness    Mild cognitive impairment    NSVT (nonsustained ventricular tachycardia) ([place])    a. 11/2018 Zio monitor - 13 beats of NSVT--asymptomatic.   Orthostatic hypotension    a. 11/2018 pressures  improved w/ midodrine/florinef.   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 11/2018 Zio monitor: Avg HR 57 (min 28). Intermittent Mobitz I.      Past Surgical History:  Procedure Laterality Date   CATARACT EXTRACTION W/PHACO Left 12/09/2020   Procedure: CATARACT EXTRACTION PHACO AND INTRAOCULAR LENS PLACEMENT (IOC) LEFT 6.94 01:09.7 9.9% ;  Surgeon: Leandrew Koyanagi, MD;  Location: [place];  Service: Ophthalmology;  Laterality: Left;   CATARACT EXTRACTION W/PHACO Right 12/23/2020   Procedure: CATARACT EXTRACTION PHACO AND INTRAOCULAR LENS PLACEMENT (Icard) RIGHT MALYUGIN;  Surgeon: Leandrew Koyanagi, MD;  Location: [place];  Service: Ophthalmology;  Laterality: Right;  8.44 1:14.6 11.3%   CHOLECYSTECTOMY N/A 09/13/2018   Procedure: LAPAROSCOPIC CHOLECYSTECTOMY;  Surgeon: Jules Husbands, MD;  Location: [facility];  Service: General;  Laterality: N/A;   COLONOSCOPY  2008   ESOPHAGOGASTRODUODENOSCOPY (EGD) WITH PROPOFOL N/A 08/20/2018   Procedure: ESOPHAGOGASTRODUODENOSCOPY (EGD) WITH PROPOFOL;  Surgeon: Lucilla Lame, MD;  Location: [facility];  Service: Endoscopy;  Laterality: N/A;   TONSILLECTOMY AND ADENOIDECTOMY  1955   TOTAL SHOULDER ARTHROPLASTY Left 09/11/2019   Procedure: TOTAL SHOULDER ARTHROPLASTY;  Surgeon: Lovell Sheehan, MD;  Location: [facility];  Service: Orthopedics;  Laterality: Left;   TOTAL SHOULDER ARTHROPLASTY Right 08/02/2021   Procedure: TOTAL SHOULDER ARTHROPLASTY;  Surgeon: Kurtis Bushman  R, MD;  Location: [facility];  Service: Orthopedics;  Laterality: Right;    No Known Allergies  Current Outpatient Medications  Medication Sig Dispense Refill   acetaminophen (TYLENOL) 500 MG tablet Take 1,000 mg by mouth every 6 (six) hours as needed for moderate pain.     apixaban (ELIQUIS) 5 MG TABS tablet Take 1 tablet (5 mg total) by mouth 2 (two) times daily.     Azelastine HCl 137  MCG/SPRAY SOLN PLACE 2 SPRAYS INTO BOTH NOSTRILS 2 (TWO) TIMES DAILY 30 mL 3   carbidopa-levodopa (SINEMET CR) 50-200 MG tablet Take 1 tablet by mouth at bedtime.     carbidopa-levodopa (SINEMET IR) 25-100 MG tablet Take 2 tablets by mouth 3 (three) times daily.     Cholecalciferol (VITAMIN D3 PO) Take 1 tablet by mouth daily.     Clobetasol Prop Emollient Base (CLOBETASOL PROPIONATE E) 0.05 % emollient cream Apply 1 Application topically 2 (two) times daily. Up to 5 days per week prn to affected areas - avoid face, groin, underarms 60 g 1   docusate sodium (COLACE) 100 MG capsule Take 1 capsule (100 mg total) by mouth 2 (two) times daily. 30 capsule 0   doxycycline (VIBRA-TABS) 100 MG tablet Take 1 tablet (100 mg total) by mouth 2 (two) times daily for 5 days. 10 tablet 0   meloxicam (MOBIC) 15 MG tablet Take 15 mg by mouth daily.     memantine (NAMENDA) 5 MG tablet Take 5 mg by mouth 2 (two) times daily.     rOPINIRole (REQUIP) 1 MG tablet Take 1 mg by mouth 4 (four) times daily.     rosuvastatin (CRESTOR) 10 MG tablet TAKE 1 TABLET BY MOUTH EVERY DAY 90 tablet 0   sertraline (ZOLOFT) 100 MG tablet Take 100 mg by mouth daily.     No current facility-administered medications for this visit.    Family History Family History  Problem Relation Age of Onset   Cancer Mother        lung   Heart disease Father    Hearing loss Sister        coclear implant   Hearing loss Brother    Cancer Paternal Grandmother    Cancer Son        colon cancer   Colon cancer Neg Hx    Stomach cancer Neg Hx       Social History Social History   Tobacco Use   Smoking status: Former    Packs/day: 1.50    Years: 35.00    Total pack years: 52.50    Types: Cigarettes    Quit date: 06/22/1992    Years since quitting: 30.4   Smokeless tobacco: Former    Quit date: 06/22/1992  Vaping Use   Vaping Use: Never used  Substance Use Topics   Alcohol use: Not Currently    Alcohol/week: 1.0 standard drink of  alcohol    Types: 1 Cans of beer per week    Comment: occasional   Drug use: No          Physical Exam Blood pressure (!) 136/53, pulse (!) 56, temperature 97.6 F (36.4 C), temperature source Oral, height '5\' 10"'$  (1.778 m), weight 208 lb 9.6 oz (94.6 kg), SpO2 97 %. Last Weight  Most recent update: 12/08/2022  2:58 PM    Weight  94.6 kg (208 lb 9.6 oz)             CONSTITUTIONAL: Well developed, and nourished, appropriately responsive  and aware without distress.   EYES: Sclera non-icteric.   EARS, NOSE, MOUTH AND THROAT:  The oropharynx is clear. Oral mucosa is pink and moist.   Hearing is intact to voice.  NECK: Trachea is midline, and there is no jugular venous distension.  LYMPH NODES:  Lymph nodes in the neck are not appreciated. RESPIRATORY:  Lungs are clear, and breath sounds are equal bilaterally. Normal respiratory effort without pathologic use of accessory muscles. CARDIOVASCULAR: Heart is regular in rate and rhythm.  Well perfused.  GI: The abdomen is soft, nontender, and nondistended. MUSCULOSKELETAL:  Symmetrical muscle tone appreciated in all four extremities.    SKIN: Skin turgor is normal. No pathologic skin lesions appreciated.  On the medial right thigh there is an area of dermal necrosis/residual slough/fibrinous exudate overlying an area of the diameter of approximately 2 cm.  I felt it was prudent to alleviate the obstructive feature of this tissue and allow adequate drainage/wound care to the underlying viable tissues.  Therefore was locally prepped with ChloraPrep, anesthetized with 1% lidocaine with epinephrine, and sharp excisional debridement was completed of the skin and subcutaneous tissues overlying this area of the medial right thigh.  This extended into the subcutaneous tissues, which did have evidence of underlying granulation.  I removed the nonviable debris and fibrinous exudates. We then applied triple antibiotic ointment and a topical dressing to  this area and secured it.  He was given instructions to change this daily, he may shower.  He will follow-up with [place] in 2 weeks. NEUROLOGIC:  Motor and sensation appear grossly normal.  Cranial nerves are grossly without defect. PSYCH:  Alert and oriented to person, place and time. Affect is appropriate for situation.  Data Reviewed I have personally reviewed what is currently available of the patient's imaging, recent labs and medical records.   Labs:     Latest Ref Rng & Units 10/21/2022    9:38 AM 07/27/2021   11:13 AM 04/20/2021   11:55 AM  CBC  WBC 3.8 - 10.8 Thousand/uL 7.3  7.7  6.9   Hemoglobin 13.2 - 17.1 g/dL 12.5  14.1  13.6   Hematocrit 38.5 - 50.0 % 37.8  41.1  40.9   Platelets 140 - 400 Thousand/uL 289  224  218       Latest Ref Rng & Units 10/21/2022    9:38 AM 07/27/2021   11:13 AM 04/20/2021   11:55 AM  CMP  Glucose 65 - 99 mg/dL 99  109  103   BUN 7 - 25 mg/dL '16  18  22   '$ Creatinine 0.70 - 1.22 mg/dL 1.08  0.99  1.12   Sodium 135 - 146 mmol/L 138  138  140   Potassium 3.5 - 5.3 mmol/L 4.6  3.9  4.8   Chloride 98 - 110 mmol/L 103  106  105   CO2 20 - 32 mmol/L '26  27  27   '$ Calcium 8.6 - 10.3 mg/dL 9.7  9.6  9.6   Total Protein 6.1 - 8.1 g/dL 7.1   6.9   Total Bilirubin 0.2 - 1.2 mg/dL 0.4   0.6   AST 10 - 35 U/L 16   24   ALT 9 - 46 U/L 15   17       Imaging: Radiological images reviewed:   Within last 24 hrs: No results found.  Assessment    Wound right medial thigh secondary to recent MRSA infection. 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[place]) 04/20/2021   Parkinson's disease 10/20/2020   Carotid atherosclerosis, bilateral 02/20/2019   Vitamin B12  deficiency 10/15/2018   Left bundle branch block 06/25/2018   Second degree AV block, Mobitz type I 06/25/2018   Aortic calcification ([facility]) 06/15/2018   Adrenal adenoma 05/09/2017   Pulmonary nodules 04/19/2017   BPH (benign prostatic hyperplasia) 04/19/2017   Coronary artery disease of native artery of native heart with stable angina pectoris ([place]) 03/25/2016   Hyperlipidemia 07/07/2015   Essential hypertension 07/07/2015    Plan    Excisional debridement as described above, skin and subcutaneous tissues.  Wound care also as noted in the examination section above.  Will anticipate follow-up in 2 weeks or as needed to assist with wound care.  No need for additional antibiotics.  I believe Mr. Stieg understands, and seems readily able to take care of this himself.  Face-to-face time spent with the patient and accompanying care providers(if present) was 40 minutes, with more than 50% of the time spent counseling, educating, and coordinating care of the patient.    These notes generated with voice recognition software. I apologize for typographical errors.  Ronny Bacon M.D., FACS 12/09/2022, 3:17 PM

## 2022-12-12 NOTE — Progress Notes (Unsigned)
Date:  12/13/2022   ID:  Stephen Stein, DOB 1941/05/25, MRN [id number]  Patient Location:  [name] [address]   Provider location:   [facility], [place] office  PCP:  Delsa Grana, PA-C  Cardiologist:  Patsy Baltimore  Chief Complaint  Patient presents with   6 month follow up     "Doing well." Medications reviewed by the patient verbally.     History of Present Illness:    Stephen Stein is a 81 y.o. male with  past medical history of smoking history, stopped 20 years ago,  hyperlipidemia,  palpitations, treated with atenolol,  CT coronary calcium score with score  800,  Minor carotid intimal thickening and atherosclerosis. stress testing showing no significant ischemia 10/19 Parkinson's who presents for routine follow-up Of his coronary artery disease   LOV June 2023 Seen by EP November 2023 Noted to be in atrial fibrillation on last clinic visit June 2023  Echocardiogram Ejection fraction appears relatively unchanged from study in 2019 EF estimated 45 to 50% No significant valvular heart disease Normal right ventricular size and function  September fell off a bridge, broken foot on left, in a boot, had surgery oct   No symptoms from his atrial fibrillation Denies any significant shortness of breath on exertion, no tachypalpitations, no near-syncope or syncope Exercise limited by foot in the boot  Tolerating Eliquis 5 twice daily  EKG personally reviewed by myself on todays visit Atrial fibrillation ventricular rate 61 bpm left bundle branch block  Prior history of AV nodal disease 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 Caryl Comes July 2022 Asymptomatic, no pacer at that time Orthostatics were negative, treadmill study performed: improved condution  Suffered a fall May 2023 Fractured several ribs on the left  Lab work reviewed Total cholesterol 128 LDL 71 Creatinine 1.2  BUN 19 Hemoglobin 14  Reports prior diagnosis of Parkinson's Chronic dizziness, gait instability  CT coronary calcium score, 800 He has three-vessel coronary artery disease, no significant aortic atherosclerosis   Previous stress test showing no significant ischemia.  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 08/2018 s/p lap chole.   BPH (benign prostatic hyperplasia)    Carotid atherosclerosis, bilateral    a. 01/2019 Carotid U/S: <50% bilat ICA stenosis.   Constipation 10/21/2021   Coronary artery calcification seen on CT scan    a. 11/2015 Cardiac CT: Cor Ca2+ = 892 (80th %'ile). Diffuse Ca2+ in all 3 major coronary arteries; b. 02/2016 MV: HTN response to exercise. Abnl ecg w/ horizontal inflat ST depression, but no ischemia/infarct on imaging; c. 09/2018 MV: EF 64%, no ischemia. Very sm region of mild distal antsept defect, likely 2/2 lbbb.   Depression    Grade I diastolic dysfunction    Hepatic steatosis    Hx of basal cell carcinoma 08/01/2019   R chest parasternal   Hyperlipidemia    Hypertension    Irregular heartbeat/Palpitations    a. Has been taking Atenolol for  20 years   LBBB (left bundle branch block)    Left ventricular hypokinesis    a. 09/2018 Echo: EF 45-50%, mod LVH, diff HK. Gr1 DD. Triv AI. Mildly dil Ao root. Mildly reduced RV fxn.   Lightheadedness    Mild cognitive impairment    NSVT (nonsustained ventricular tachycardia) ([place])    a. 11/2018 Zio monitor - 13 beats of NSVT--asymptomatic.   Orthostatic hypotension    a. 11/2018  pressures improved w/ midodrine/florinef.   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 11/2018 Zio monitor: Avg HR 57 (min 28). Intermittent Mobitz I.   Past Surgical History:  Procedure Laterality Date   CATARACT EXTRACTION W/PHACO Left 12/09/2020   Procedure: CATARACT EXTRACTION PHACO AND INTRAOCULAR LENS PLACEMENT ([facility]) LEFT 6.94 01:09.7 9.9% ;  Surgeon: Leandrew Koyanagi, MD;  Location: [facility];  Service: Ophthalmology;  Laterality: Left;   CATARACT EXTRACTION W/PHACO Right 12/23/2020   Procedure: CATARACT EXTRACTION PHACO AND INTRAOCULAR LENS PLACEMENT ([place]) RIGHT MALYUGIN;  Surgeon: Leandrew Koyanagi, MD;  Location: [facility];  Service: Ophthalmology;  Laterality: Right;  8.44 1:14.6 11.3%   CHOLECYSTECTOMY N/A 09/13/2018   Procedure: LAPAROSCOPIC CHOLECYSTECTOMY;  Surgeon: Jules Husbands, MD;  Location: [facility];  Service: General;  Laterality: N/A;   COLONOSCOPY  2008   ESOPHAGOGASTRODUODENOSCOPY (EGD) WITH PROPOFOL N/A 08/20/2018   Procedure: ESOPHAGOGASTRODUODENOSCOPY (EGD) WITH PROPOFOL;  Surgeon: Lucilla Lame, MD;  Location: [facility];  Service: Endoscopy;  Laterality: N/A;   TONSILLECTOMY AND ADENOIDECTOMY  1955   TOTAL SHOULDER ARTHROPLASTY Left 09/11/2019   Procedure: TOTAL SHOULDER ARTHROPLASTY;  Surgeon: Lovell Sheehan, MD;  Location: [facility];  Service: Orthopedics;  Laterality: Left;   TOTAL SHOULDER ARTHROPLASTY Right 08/02/2021   Procedure: TOTAL SHOULDER ARTHROPLASTY;  Surgeon: Lovell Sheehan, MD;  Location: [facility];  Service: Orthopedics;  Laterality: Right;     Current Meds  Medication Sig   acetaminophen (TYLENOL) 500 MG tablet Take 1,000 mg by mouth every 6 (six) hours as needed for moderate pain.   apixaban (ELIQUIS) 5 MG TABS tablet Take 1 tablet (5 mg total) by mouth 2 (two) times daily.   Azelastine HCl 137 MCG/SPRAY SOLN PLACE 2 SPRAYS INTO BOTH NOSTRILS 2 (TWO)  TIMES DAILY   carbidopa-levodopa (SINEMET CR) 50-200 MG tablet Take 1 tablet by mouth at bedtime.   carbidopa-levodopa (SINEMET IR) 25-100 MG tablet Take 2 tablets by mouth 3 (three) times daily.   Cholecalciferol (VITAMIN D3 PO) Take 1 tablet by mouth daily.   Clobetasol Prop Emollient Base (CLOBETASOL PROPIONATE E) 0.05 % emollient cream Apply 1 Application topically 2 (two) times daily. Up to 5 days per week prn to affected areas - avoid face, groin, underarms   docusate sodium (COLACE) 100 MG capsule Take 1 capsule (100 mg total) by mouth 2 (two) times daily.   meloxicam (MOBIC) 15 MG tablet Take 15 mg by mouth daily.   memantine (NAMENDA) 5 MG tablet Take 5 mg by mouth 2 (two) times daily.   rOPINIRole (REQUIP) 1 MG tablet Take 1 mg by mouth 4 (four) times daily.   rosuvastatin (CRESTOR) 10 MG tablet TAKE 1 TABLET BY MOUTH EVERY DAY   sertraline (ZOLOFT) 100 MG tablet Take 100 mg by mouth daily.     Allergies:   Patient has no known allergies.   Social History   Tobacco Use   Smoking status: Former    Packs/day: 1.50  Years: 35.00    Total pack years: 52.50    Types: Cigarettes    Quit date: 06/22/1992    Years since quitting: 30.4   Smokeless tobacco: Former    Quit date: 06/22/1992  Vaping Use   Vaping Use: Never used  Substance Use Topics   Alcohol use: Not Currently    Alcohol/week: 1.0 standard drink of alcohol    Types: 1 Cans of beer per week    Comment: occasional   Drug use: No    Family Hx: The patient's family history includes Cancer in his mother, paternal grandmother, and son; Hearing loss in his brother and sister; Heart disease in his father. There is no history of Colon cancer or Stomach cancer.  ROS:   Please see the history of present illness.    Review of Systems  Constitutional: Negative.   Respiratory: Negative.    Cardiovascular: Negative.   Gastrointestinal: Negative.   Musculoskeletal: Negative.   Neurological:  Positive for dizziness.   Psychiatric/Behavioral: Negative.    All other systems reviewed and are negative.    Labs/Other Tests and Data Reviewed:    Recent Labs: 10/21/2022: ALT 15; BUN 16; Creat 1.08; Hemoglobin 12.5; Platelets 289; Potassium 4.6; Sodium 138   Recent Lipid Panel Lab Results  Component Value Date/Time   CHOL 128 04/25/2022 08:06 AM   CHOL 110 06/01/2016 08:40 AM   TRIG 120 04/25/2022 08:06 AM   HDL 36 (L) 04/25/2022 08:06 AM   HDL 39 (L) 06/01/2016 08:40 AM   CHOLHDL 3.6 04/25/2022 08:06 AM   LDLCALC 71 04/25/2022 08:06 AM    Wt Readings from Last 3 Encounters:  12/13/22 207 lb (93.9 kg)  12/08/22 208 lb 9.6 oz (94.6 kg)  12/01/22 205 lb (93 kg)     Exam:   BP 120/60 (BP Location: Left Arm, Patient Position: Sitting, Cuff Size: Normal)   Pulse 61   Ht '5\' 10"'$  (1.778 m)   Wt 207 lb (93.9 kg)   SpO2 96%   BMI 29.70 kg/m  Constitutional:  oriented to person, place, and time. No distress.  HENT:  Head: Grossly normal Eyes:  no discharge. No scleral icterus.  Neck: No JVD, no carotid bruits  Cardiovascular: Irregularly irregular, no murmurs appreciated Pulmonary/Chest: Clear to auscultation bilaterally, no wheezes or rails Abdominal: Soft.  no distension.  no tenderness.  Musculoskeletal: Normal range of motion Neurological:  normal muscle tone. Coordination normal. No atrophy Skin: Skin warm and dry Psychiatric: normal affect, pleasant  ASSESSMENT & PLAN:    sick sinus syndrome/persistent atrial fibrillation/flutter Rate well-controlled, tolerating Eliquis 5 twice daily Reports he is asymptomatic, long discussion concerning various treatment options He is inclined for rate control, not rhythm control Declined cardioversion at this time  Mixed hyperlipidemia - Plan: EKG 12-Lead Cholesterol is at goal on the current lipid regimen. No changes to the medications were made.  Coronary artery disease involving native coronary artery of native heart without angina pectoris -  Plan: EKG 12-Lead Prior stress test with no significant ischemia Currently with no symptoms of angina. No further workup at this time. Continue current medication regimen.   Dizziness Neurologic issue, previously treated with sertraline and clonazepam Notes from neurology indicating cognitive issues, Parkinson's Reports things are stable    Total encounter time more than 30 minutes  Greater than 50% was spent in counseling and coordination of care with the patient   Signed, Ida Rogue, MD  12/13/2022 10:08 AM    [place]  [facility] [address], [place], [place] [address]

## 2022-12-13 ENCOUNTER — Ambulatory Visit: Payer: PPO | Attending: Cardiovascular Disease | Admitting: Cardiovascular Disease

## 2022-12-13 ENCOUNTER — Encounter: Payer: Self-pay | Admitting: Cardiovascular Disease

## 2022-12-13 VITALS — BP 120/60 | HR 61 | Ht 70.0 in | Wt 207.0 lb

## 2022-12-13 DIAGNOSIS — I25118 Atherosclerotic heart disease of native coronary artery with other forms of angina pectoris: Secondary | ICD-10-CM | POA: Diagnosis not present

## 2022-12-13 DIAGNOSIS — I4891 Unspecified atrial fibrillation: Secondary | ICD-10-CM

## 2022-12-13 DIAGNOSIS — I447 Left bundle-branch block, unspecified: Secondary | ICD-10-CM | POA: Diagnosis not present

## 2022-12-13 DIAGNOSIS — E782 Mixed hyperlipidemia: Secondary | ICD-10-CM | POA: Diagnosis not present

## 2022-12-13 DIAGNOSIS — F0280 Dementia in other diseases classified elsewhere without behavioral disturbance: Secondary | ICD-10-CM | POA: Diagnosis not present

## 2022-12-13 DIAGNOSIS — R001 Bradycardia, unspecified: Secondary | ICD-10-CM

## 2022-12-13 DIAGNOSIS — R42 Dizziness and giddiness: Secondary | ICD-10-CM | POA: Diagnosis not present

## 2022-12-13 DIAGNOSIS — F0150 Vascular dementia without behavioral disturbance: Secondary | ICD-10-CM

## 2022-12-13 DIAGNOSIS — I10 Essential (primary) hypertension: Secondary | ICD-10-CM

## 2022-12-13 DIAGNOSIS — G309 Alzheimer's disease, unspecified: Secondary | ICD-10-CM

## 2022-12-13 DIAGNOSIS — I441 Atrioventricular block, second degree: Secondary | ICD-10-CM | POA: Diagnosis not present

## 2022-12-13 MED ORDER — APIXABAN 5 MG PO TABS: 5.0000 mg | ORAL_TABLET | Freq: Two times a day (BID) | ORAL | 3 refills | Status: DC

## 2022-12-13 NOTE — Patient Instructions (Signed)
Medication Instructions:  No changes  If you need a refill on your cardiac medications before your next appointment, please call your pharmacy.   Lab work: No new labs needed  Testing/Procedures: No new testing needed  Follow-Up: At [facility], you and your health needs are our priority.  As part of our continuing mission to provide you with exceptional heart care, we have created designated Provider Care Teams.  These Care Teams include your primary Cardiologist (physician) and Advanced Practice Providers (APPs -  Physician Assistants and Nurse Practitioners) who all work together to provide you with the care you need, when you need it.  You will need a follow up appointment in 12 months  Providers on your designated Care Team:   [name], NP [name], PA-C [name], PA-C  COVID-19 Vaccine Information can be found at: [email or web address] For questions related to vaccine distribution or appointments, please email [email or web address] or call [phone].   

## 2022-12-14 DIAGNOSIS — M19072 Primary osteoarthritis, left ankle and foot: Secondary | ICD-10-CM | POA: Diagnosis not present

## 2022-12-16 DIAGNOSIS — M19072 Primary osteoarthritis, left ankle and foot: Secondary | ICD-10-CM | POA: Diagnosis not present

## 2022-12-21 ENCOUNTER — Other Ambulatory Visit: Payer: Self-pay

## 2022-12-21 ENCOUNTER — Ambulatory Visit (INDEPENDENT_AMBULATORY_CARE_PROVIDER_SITE_OTHER): Payer: PPO | Admitting: Physician Assistant

## 2022-12-21 ENCOUNTER — Encounter: Payer: Self-pay | Admitting: Physician Assistant

## 2022-12-21 VITALS — BP 128/59 | HR 55 | Temp 98.7°F | Ht 70.0 in | Wt 209.0 lb

## 2022-12-21 DIAGNOSIS — Z09 Encounter for follow-up examination after completed treatment for conditions other than malignant neoplasm: Secondary | ICD-10-CM

## 2022-12-21 DIAGNOSIS — L089 Local infection of the skin and subcutaneous tissue, unspecified: Secondary | ICD-10-CM

## 2022-12-21 DIAGNOSIS — M19072 Primary osteoarthritis, left ankle and foot: Secondary | ICD-10-CM | POA: Diagnosis not present

## 2022-12-21 DIAGNOSIS — S71101D Unspecified open wound, right thigh, subsequent encounter: Secondary | ICD-10-CM

## 2022-12-21 DIAGNOSIS — S71001A Unspecified open wound, right hip, initial encounter: Secondary | ICD-10-CM

## 2022-12-21 NOTE — Patient Instructions (Signed)
Please call with any questions or concerns.

## 2022-12-21 NOTE — Progress Notes (Signed)
[facility] POST-OP OFFICE VISIT  12/21/2022  HPI: Stephen Stein is a 81 y.o. male 13 days s/p excision debridement of right medial thigh wound with Dr Christian Mate   Wound is healing; no issues No drainage No erythema Doing superficial dressing with Band-Aid   Vital signs: BP (!) 128/59   Pulse (!) 55   Temp 98.7 F (37.1 C) (Oral)   Ht '5\' 10"'$  (1.778 m)   Wt 209 lb (94.8 kg)   SpO2 96%   BMI 29.99 kg/m    Physical Exam: Constitutional: Well appearing male, NAD Skin: Small 2 cm wound to the right distal medial thigh; well healed, slight expected induration, no erythema, non-tender, no drainage   Assessment/Plan: This is a 81 y.o. male 13 days s/p excision debridement of right medial thigh wound with Dr Christian Mate    - Pain control prn  - Reviewed wound care recommendation; superficial dressing prn  - He can follow up on as needed basis; He understands to call with questions/concerns  -- Edison Simon, PA-C [facility] 12/21/2022, 10:24 AM M-F: 7am - 4pm

## 2022-12-22 ENCOUNTER — Ambulatory Visit: Payer: PPO | Admitting: Physician Assistant

## 2023-02-01 ENCOUNTER — Other Ambulatory Visit: Payer: Self-pay | Admitting: Family Medicine

## 2023-02-01 DIAGNOSIS — R399 Unspecified symptoms and signs involving the genitourinary system: Secondary | ICD-10-CM

## 2023-02-09 DIAGNOSIS — M19072 Primary osteoarthritis, left ankle and foot: Secondary | ICD-10-CM | POA: Diagnosis not present

## 2023-02-09 DIAGNOSIS — Z981 Arthrodesis status: Secondary | ICD-10-CM | POA: Diagnosis not present

## 2023-02-16 ENCOUNTER — Other Ambulatory Visit: Payer: Self-pay | Admitting: Family Medicine

## 2023-02-16 DIAGNOSIS — E782 Mixed hyperlipidemia: Secondary | ICD-10-CM

## 2023-03-13 ENCOUNTER — Encounter: Payer: Self-pay | Admitting: Family Medicine

## 2023-03-13 ENCOUNTER — Ambulatory Visit (INDEPENDENT_AMBULATORY_CARE_PROVIDER_SITE_OTHER): Payer: PPO | Admitting: Family Medicine

## 2023-03-13 VITALS — BP 116/64 | HR 59 | Temp 98.1°F | Resp 16 | Ht 70.0 in | Wt 214.1 lb

## 2023-03-13 DIAGNOSIS — H6011 Cellulitis of right external ear: Secondary | ICD-10-CM | POA: Diagnosis not present

## 2023-03-13 DIAGNOSIS — H9391 Unspecified disorder of right ear: Secondary | ICD-10-CM | POA: Diagnosis not present

## 2023-03-13 MED ORDER — MUPIROCIN 2 % EX OINT: 1.0000 | TOPICAL_OINTMENT | Freq: Two times a day (BID) | CUTANEOUS | 0 refills | Status: DC

## 2023-03-13 MED ORDER — DOXYCYCLINE HYCLATE 100 MG PO TABS: 100.0000 mg | ORAL_TABLET | Freq: Two times a day (BID) | ORAL | 0 refills | Status: AC

## 2023-03-13 NOTE — Progress Notes (Signed)
Patient ID: Stephen Stein, male    DOB: [birth year], 82 y.o.   MRN: [id number]  PCP: Delsa Grana, PA-C  Chief Complaint  Patient presents with   Mass    On right ear onset for weeks, pt states has popped it once but grew back and its tender    Subjective:   Stephen Stein is a 82 y.o. male, presents to clinic with CC of the following:  HPI  Here for ear lesion/infection that has been there for 2 weeks, they tried to pop it at home - some white and bloody discharge came out, but it didn't help, it seemed to gradually get bigger and more painful.  Right upper ear- doesn't know if anything happened to start it. 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) 12/09/2022   Open wound of right hip and thigh with complication [id number]   Osteoarthritis of ankle and foot 12/08/2022   Prediabetes 10/21/2022   RLS (restless legs syndrome) 10/21/2022   Neuropathy 10/21/2022   At high risk for falls 10/21/2022   Atrial fibrillation ([place]) 09/05/2022   Traumatic blister of ankle 09/05/2022   Allergic rhinitis 10/21/2021   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[place]) 04/20/2021   Parkinson's disease 10/20/2020   Carotid atherosclerosis, bilateral 02/20/2019   Vitamin B12 deficiency 10/15/2018   Left bundle branch block 06/25/2018   Second degree AV block, Mobitz type I 06/25/2018   Aortic calcification ([place]) 06/15/2018   Adrenal adenoma 05/09/2017   Pulmonary nodules 04/19/2017   BPH (benign prostatic hyperplasia) 04/19/2017   Pain in left arm 01/20/2017   Coronary artery disease of native artery of native heart with stable angina pectoris ([place]) 03/25/2016   Hyperlipidemia 07/07/2015   Essential hypertension 07/07/2015      Current  Outpatient Medications:    acetaminophen (TYLENOL) 500 MG tablet, Take 1,000 mg by mouth every 6 (six) hours as needed for moderate pain., Disp: , Rfl:    apixaban (ELIQUIS) 5 MG TABS tablet, Take 1 tablet (5 mg total) by mouth 2 (two) times daily., Disp: 180 tablet, Rfl: 3   Azelastine HCl 137 MCG/SPRAY SOLN, PLACE 2 SPRAYS INTO BOTH NOSTRILS 2 (TWO) TIMES DAILY, Disp: 30 mL, Rfl: 3   carbidopa-levodopa (SINEMET IR) 25-100 MG tablet, Take 2 tablets by mouth 3 (three) times daily., Disp: , Rfl:    Cholecalciferol (VITAMIN D3 PO), Take 1 tablet by mouth daily., Disp: , Rfl:    docusate sodium (COLACE) 100 MG capsule, Take 1 capsule (100 mg total) by mouth 2 (two) times daily., Disp: 30 capsule, Rfl: 0   meloxicam (MOBIC) 15 MG tablet, Take 15 mg by mouth daily., Disp: , Rfl:    memantine (NAMENDA) 5 MG tablet, Take 5 mg by mouth 2 (two) times daily., Disp: , Rfl:    rOPINIRole (REQUIP) 1 MG tablet, Take 1 mg by mouth 4 (four) times daily., Disp: , Rfl:    rosuvastatin (CRESTOR) 10 MG tablet, TAKE 1 TABLET BY MOUTH EVERY DAY, Disp: 90 tablet, Rfl: 0   sertraline (ZOLOFT) 100 MG tablet, Take 100 mg by mouth daily., Disp: , Rfl:    tamsulosin (FLOMAX) 0.4 MG CAPS capsule, TAKE 1 CAPSULE BY MOUTH EVERYDAY AT BEDTIME, Disp: 90  capsule, Rfl: 3   carbidopa-levodopa (SINEMET CR) 50-200 MG tablet, Take 1 tablet by mouth at bedtime., Disp: , Rfl:    Clobetasol Prop Emollient Base (CLOBETASOL PROPIONATE E) 0.05 % emollient cream, Apply 1 Application topically 2 (two) times daily. Up to 5 days per week prn to affected areas - avoid face, groin, underarms, Disp: 60 g, Rfl: 1   No Known Allergies   Social History   Tobacco Use   Smoking status: Former    Packs/day: 1.50    Years: 35.00    Additional pack years: 0.00    Total pack years: 52.50    Types: Cigarettes    Quit date: 06/22/1992    Years since quitting: 30.7   Smokeless tobacco: Former    Quit date: 06/22/1992  Vaping Use   Vaping Use:  Never used  Substance Use Topics   Alcohol use: Not Currently    Alcohol/week: 1.0 standard drink of alcohol    Types: 1 Cans of beer per week    Comment: occasional   Drug use: No      Chart Review Today: I personally reviewed active problem list, medication list, allergies, family history, social history, health maintenance, notes from last encounter, lab results, imaging with the patient/caregiver today.   Review of Systems     Objective:   Vitals:   03/13/23 0938  BP: 116/64  Pulse: (!) 59  Resp: 16  Temp: 98.1 F (36.7 C)  TempSrc: Oral  SpO2: 95%  Weight: 214 lb 1.6 oz (97.1 kg)  Height: 5\' 10"  (1.778 m)    Body mass index is 30.72 kg/m.  Physical Exam Vitals and nursing note reviewed.  Constitutional:      Appearance: Normal appearance. He is well-developed.  HENT:     Head: Normocephalic and atraumatic.     Ears:     Comments: Lesion/nodule to top of right ear, with surrounding edema, some erythema, very tender and indurated No fluctuance or drainage - see photos    Nose: Nose normal.  Eyes:     General:        Right eye: No discharge.        Left eye: No discharge.     Conjunctiva/sclera: Conjunctivae normal.  Neck:     Trachea: No tracheal deviation.  Cardiovascular:     Rate and Rhythm: Normal rate and regular rhythm.  Pulmonary:     Effort: Pulmonary effort is normal. No respiratory distress.     Breath sounds: No stridor.  Musculoskeletal:        General: Normal range of motion.  Skin:    General: Skin is warm and dry.     Findings: No rash.  Neurological:     Mental Status: He is alert.     Motor: No abnormal muscle tone.     Coordination: Coordination normal.  Psychiatric:        Behavior: Behavior normal.              Results for orders placed or performed in visit on 12/01/22  Wound culture   Specimen: Wound  Result Value Ref Range   MICRO NUMBER: [id number]    SPECIMEN QUALITY: Adequate    SOURCE: THIGH, RIGHT     STATUS: FINAL    GRAM STAIN:      Few Polymorphonuclear leukocytes Rare epithelial cells Moderate Gram positive cocci in pairs   ISOLATE 1: methicillin resistant Staphylococcus aureus (A)       Susceptibility   Methicillin  resistant staphylococcus aureus - AEROBIC CULT, GRAM STAIN POSITIVE 1    VANCOMYCIN <=0.5 Sensitive     CIPROFLOXACIN <=0.5 Sensitive     CLINDAMYCIN <=0.25 Sensitive     LEVOFLOXACIN <=0.12 Sensitive     ERYTHROMYCIN <=0.25 Sensitive     GENTAMICIN <=0.5 Sensitive     OXACILLIN* NR Resistant      * Oxacillin-resistant staphylococci are resistant to all currently available beta-lactam antimicrobial agents with the possible exception of ceftaroline.     TETRACYCLINE <=1 Sensitive     TRIMETH/SULFA* <=10 Sensitive      * Oxacillin-resistant staphylococci are resistant to all currently available beta-lactam antimicrobial agents with the possible exception of ceftaroline. Legend: S = Susceptible  I = Intermediate R = Resistant  NS = Not susceptible * = Not tested  NR = Not reported **NN = See antimicrobic comments        Assessment & Plan:   1. Cellulitis of right ear Pt to start tx and coverage for possible infection with his recent MRSA hx, but I am concerned by the appearance that it may need biopsy or further eval by ENT or derm Warm soaks, oral abx and can use mupirocin  Will try to arrange f/up in the next week - if not in to ENT or derm I have asked pt to return here for recheck  - doxycycline (VIBRA-TABS) 100 MG tablet; Take 1 tablet (100 mg total) by mouth 2 (two) times daily for 7 days.  Dispense: 14 tablet; Refill: 0 - mupirocin ointment (BACTROBAN) 2 %; Apply 1 Application topically 2 (two) times daily.  Dispense: 22 g; Refill: 0 - Ambulatory referral to ENT  2. Lesion of right ear See above - Ambulatory referral to ENT      Delsa Grana, PA-C 03/13/23 9:59 AM

## 2023-03-13 NOTE — Patient Instructions (Signed)
Take the antibiotic for at least 5 days - I sent in 7 days  Apply the antibiotic ointment to the top of your ear 1 to 2 x a day and do some clean warm soaks 1-2 x a day   Follow up with ENT  Or if you aren't into ENT in the next week or two weeks please see if Dr. Nehemiah Massed can get you in to recheck the skin/ear

## 2023-03-15 DIAGNOSIS — D4819 Other specified neoplasm of uncertain behavior of connective and other soft tissue: Secondary | ICD-10-CM | POA: Diagnosis not present

## 2023-03-21 DIAGNOSIS — M19072 Primary osteoarthritis, left ankle and foot: Secondary | ICD-10-CM | POA: Diagnosis not present

## 2023-03-22 ENCOUNTER — Encounter: Payer: Self-pay | Admitting: Otolaryngology

## 2023-03-30 DIAGNOSIS — Z961 Presence of intraocular lens: Secondary | ICD-10-CM | POA: Diagnosis not present

## 2023-03-31 ENCOUNTER — Encounter: Payer: Self-pay | Admitting: Otolaryngology

## 2023-03-31 ENCOUNTER — Other Ambulatory Visit: Payer: Self-pay

## 2023-03-31 ENCOUNTER — Ambulatory Visit: Payer: PPO | Admitting: Anesthesiology

## 2023-03-31 ENCOUNTER — Ambulatory Visit
Admission: RE | Admit: 2023-03-31 | Discharge: 2023-03-31 | Disposition: A | Discharge status: Home / Self Care | Payer: PPO | Attending: Otolaryngology | Admitting: Otolaryngology

## 2023-03-31 ENCOUNTER — Encounter
Admission: RE | Disposition: A | Discharge status: Home / Self Care | Payer: Self-pay | Source: Home / Self Care | Attending: Otolaryngology

## 2023-03-31 DIAGNOSIS — Z87891 Personal history of nicotine dependence: Secondary | ICD-10-CM | POA: Diagnosis not present

## 2023-03-31 DIAGNOSIS — I7090 Unspecified atherosclerosis: Secondary | ICD-10-CM | POA: Insufficient documentation

## 2023-03-31 DIAGNOSIS — E785 Hyperlipidemia, unspecified: Secondary | ICD-10-CM | POA: Diagnosis not present

## 2023-03-31 DIAGNOSIS — N400 Enlarged prostate without lower urinary tract symptoms: Secondary | ICD-10-CM | POA: Insufficient documentation

## 2023-03-31 DIAGNOSIS — I4892 Unspecified atrial flutter: Secondary | ICD-10-CM | POA: Diagnosis not present

## 2023-03-31 DIAGNOSIS — I452 Bifascicular block: Secondary | ICD-10-CM | POA: Insufficient documentation

## 2023-03-31 DIAGNOSIS — D3500 Benign neoplasm of unspecified adrenal gland: Secondary | ICD-10-CM | POA: Insufficient documentation

## 2023-03-31 DIAGNOSIS — G20A1 Parkinson's disease without dyskinesia, without mention of fluctuations: Secondary | ICD-10-CM | POA: Insufficient documentation

## 2023-03-31 DIAGNOSIS — I2510 Atherosclerotic heart disease of native coronary artery without angina pectoris: Secondary | ICD-10-CM | POA: Diagnosis not present

## 2023-03-31 DIAGNOSIS — I4891 Unspecified atrial fibrillation: Secondary | ICD-10-CM | POA: Diagnosis not present

## 2023-03-31 DIAGNOSIS — I951 Orthostatic hypotension: Secondary | ICD-10-CM | POA: Diagnosis not present

## 2023-03-31 DIAGNOSIS — D492 Neoplasm of unspecified behavior of bone, soft tissue, and skin: Secondary | ICD-10-CM | POA: Diagnosis present

## 2023-03-31 DIAGNOSIS — D485 Neoplasm of uncertain behavior of skin: Secondary | ICD-10-CM | POA: Diagnosis not present

## 2023-03-31 DIAGNOSIS — K760 Fatty (change of) liver, not elsewhere classified: Secondary | ICD-10-CM | POA: Diagnosis not present

## 2023-03-31 DIAGNOSIS — I10 Essential (primary) hypertension: Secondary | ICD-10-CM | POA: Insufficient documentation

## 2023-03-31 DIAGNOSIS — C44222 Squamous cell carcinoma of skin of right ear and external auricular canal: Secondary | ICD-10-CM | POA: Insufficient documentation

## 2023-03-31 DIAGNOSIS — M1990 Unspecified osteoarthritis, unspecified site: Secondary | ICD-10-CM | POA: Insufficient documentation

## 2023-03-31 DIAGNOSIS — L578 Other skin changes due to chronic exposure to nonionizing radiation: Secondary | ICD-10-CM | POA: Insufficient documentation

## 2023-03-31 DIAGNOSIS — R0609 Other forms of dyspnea: Secondary | ICD-10-CM | POA: Diagnosis not present

## 2023-03-31 DIAGNOSIS — C44202 Unspecified malignant neoplasm of skin of right ear and external auricular canal: Secondary | ICD-10-CM | POA: Diagnosis not present

## 2023-03-31 HISTORY — PX: EXCISION MASS HEAD: SHX6702

## 2023-03-31 HISTORY — DX: Presence of external hearing-aid: Z97.4

## 2023-03-31 HISTORY — DX: Presence of dental prosthetic device (complete) (partial): Z97.2

## 2023-03-31 SURGERY — EXCISION, MASS, HEAD
Anesthesia: General | Site: Ear | Laterality: Right

## 2023-03-31 MED ORDER — LACTATED RINGERS IV SOLN: INTRAVENOUS | Status: DC

## 2023-03-31 MED ORDER — PROPOFOL 10 MG/ML IV BOLUS
INTRAVENOUS | Status: DC | PRN
  Administered 2023-03-31 (×2): 100 mg via INTRAVENOUS

## 2023-03-31 MED ORDER — LIDOCAINE HCL (PF) 1 % IJ SOLN: INTRAMUSCULAR | Status: DC | PRN

## 2023-03-31 MED ORDER — FENTANYL CITRATE (PF) 100 MCG/2ML IJ SOLN
INTRAMUSCULAR | Status: DC | PRN
  Administered 2023-03-31 (×4): 25 ug via INTRAVENOUS

## 2023-03-31 MED ORDER — DEXAMETHASONE SODIUM PHOSPHATE 4 MG/ML IJ SOLN
INTRAMUSCULAR | Status: DC | PRN
  Administered 2023-03-31: 4 mg via INTRAVENOUS

## 2023-03-31 MED ORDER — HYDROCODONE-ACETAMINOPHEN 5-325 MG PO TABS: 1.0000 | ORAL_TABLET | Freq: Four times a day (QID) | ORAL | 0 refills | Status: AC | PRN

## 2023-03-31 MED ORDER — LIDOCAINE-EPINEPHRINE 1 %-1:100000 IJ SOLN
INTRAMUSCULAR | Status: DC | PRN
  Administered 2023-03-31: 6.5 mL

## 2023-03-31 MED ORDER — LIDOCAINE HCL (CARDIAC) PF 100 MG/5ML IV SOSY
PREFILLED_SYRINGE | INTRAVENOUS | Status: DC | PRN
  Administered 2023-03-31: 50 mg via INTRAVENOUS

## 2023-03-31 MED ORDER — BACITRACIN 500 UNIT/GM EX OINT
TOPICAL_OINTMENT | CUTANEOUS | Status: DC | PRN
  Administered 2023-03-31: 1 via TOPICAL

## 2023-03-31 MED ORDER — ONDANSETRON HCL 4 MG/2ML IJ SOLN
INTRAMUSCULAR | Status: DC | PRN
  Administered 2023-03-31: 4 mg via INTRAVENOUS

## 2023-03-31 SURGICAL SUPPLY — 25 items
BNDG ADH 2 X3.75 FABRIC TAN LF (GAUZE/BANDAGES/DRESSINGS) IMPLANT
BNDG ADH XL 3.75X2 STRCH LF (GAUZE/BANDAGES/DRESSINGS) ×1
CORD BIP STRL DISP 12FT (MISCELLANEOUS) IMPLANT
DRAPE HEAD BAR (DRAPES) ×2 IMPLANT
DRSG EMULSION OIL 3X3 NADH (GAUZE/BANDAGES/DRESSINGS) IMPLANT
DRSG GLASSCOCK MASTOID ADT (GAUZE/BANDAGES/DRESSINGS) IMPLANT
DRSG TELFA 4X3 1S NADH ST (GAUZE/BANDAGES/DRESSINGS) IMPLANT
GAUZE SPONGE 4X4 12PLY STRL (GAUZE/BANDAGES/DRESSINGS) IMPLANT
GLOVE SURG GAMMEX PI TX LF 7.5 (GLOVE) ×4 IMPLANT
GOWN STRL REUS W/ TWL LRG LVL3 (GOWN DISPOSABLE) ×2 IMPLANT
GOWN STRL REUS W/TWL LRG LVL3 (GOWN DISPOSABLE) ×1
KIT TURNOVER KIT A (KITS) ×2 IMPLANT
NDL HYPO 27GX1-1/4 (NEEDLE) IMPLANT
NEEDLE HYPO 27GX1-1/4 (NEEDLE) ×1 IMPLANT
NS IRRIG 500ML POUR BTL (IV SOLUTION) ×2 IMPLANT
PACK ENT CUSTOM (PACKS) ×2 IMPLANT
SOL PREP PVP 2OZ (MISCELLANEOUS) ×1
SOLUTION PREP PVP 2OZ (MISCELLANEOUS) ×2 IMPLANT
STRAP BODY AND KNEE 60X3 (MISCELLANEOUS) ×2 IMPLANT
SUCTION FRAZIER HANDLE 10FR (MISCELLANEOUS)
SUCTION TUBE FRAZIER 10FR DISP (MISCELLANEOUS) IMPLANT
SUT PROLENE 5 0 P 3 (SUTURE) ×2 IMPLANT
SUT VIC AB 4-0 RB1 27 (SUTURE) ×1
SUT VIC AB 4-0 RB1 27X BRD (SUTURE) ×2 IMPLANT
SYR 10ML LL (SYRINGE) ×2 IMPLANT

## 2023-03-31 NOTE — Anesthesia Postprocedure Evaluation (Signed)
Anesthesia Post Note  Patient: Stephen Stein  Procedure(s) Performed: EXCISION LESION AURICLE (Right: Ear)  Patient location during evaluation: PACU Anesthesia Type: General Level of consciousness: awake and alert Pain management: pain level controlled Vital Signs Assessment: post-procedure vital signs reviewed and stable Respiratory status: spontaneous breathing, nonlabored ventilation, respiratory function stable and patient connected to nasal cannula oxygen Cardiovascular status: blood pressure returned to baseline and stable Postop Assessment: no apparent nausea or vomiting Anesthetic complications: no   No notable events documented.   Last Vitals:  Vitals:   03/31/23 1230 03/31/23 1245  BP: 126/61 (!) 146/77  Pulse: (!) 54 60  Resp: 14 13  Temp: (!) 36.3 C (!) 36.3 C  SpO2: 96% 96%    Last Pain:  Vitals:   03/31/23 1245  PainSc: 0-No pain                 Syair Fricker C Trase Bunda

## 2023-03-31 NOTE — Transfer of Care (Signed)
Immediate Anesthesia Transfer of Care Note  Patient: Stephen Stein  Procedure(s) Performed: EXCISION LESION AURICLE (Right: Ear)  Patient Location: PACU  Anesthesia Type: General  Level of Consciousness: awake, alert  and patient cooperative  Airway and Oxygen Therapy: Patient Spontanous Breathing and Patient connected to supplemental oxygen  Post-op Assessment: Post-op Vital signs reviewed, Patient's Cardiovascular Status Stable, Respiratory Function Stable, Patent Airway and No signs of Nausea or vomiting  Post-op Vital Signs: Reviewed and stable  Complications: No notable events documented.

## 2023-03-31 NOTE — H&P (Signed)
H&P has been reviewed and patient reevaluated, no changes necessary. To be downloaded later.  

## 2023-03-31 NOTE — Op Note (Signed)
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  Complex repair of the right auricle  Surg:  Stephen Stein Stephen Stein  Anes:  Gen  EBL: 30 mL  Comp: None  Findings: The growth on the superior surface of the right auricle had grown in the last 10 days since last seen.  The growth was a little over 1 cm in width.  There is a necrotic center in the middle of the that is black.  The skin is a little hyperemic with venous congestion around it.  A 2 mm margin was marked with a pie shaped wedge incision of the upper auricle marked that would be full-thickness removal.  Procedure: The patient was given general anesthesia by laryngeal mask.  Once the patient was asleep his head was rotated slightly to the left to give better exposure to the right auricle.  6.5 milliliters of lidocaine 1% with epi 1: 100,000 was used for infiltration around the posterior and anterior border of the auricle.  This surrounded the lesion to provide some anesthesia here postop.  This gave some vasoconstriction as well.  The patient has been anticoagulants but it stopped 3 days earlier.  The patient was prepped and draped in sterile fashion.  The pie shaped incision was created with leaving a 2 mm margin on both sides of the lesion.  Full-thickness auricle was removed to send the entire specimen to pathology for permanent section.  There was some significant oozing and bipolar cautery was used to cauterize any vessels but not to cauterize the skin edges.  Some more of the cartilage was trimmed at the distal end of the pie shaped so that the cartilage would rotate to the middle to close the hole.  The auricle now could pull together and this  made the auricle look smaller but it had a normal appearance and shape to it.  A 4-0 Vicryl was used in a figure-of-eight suture to pull the cartilage together to take the tension off the skin edges.  The figure-of-eight suture was used to the cartilage would not overlap on itself.  Multiple sutures were placed in the cartilage to pull it together to hold the ear in a normal position.  The skin was then sutured on the anterior border first using a 5-0 Prolene running locking suture.  This helped to evert the edges some this started anteriorly on the outside surface and overlying the tip of the ear and then coursed along the back of the ear to close the pie shaped wound in the back.  A couple of the septal sutures were placed right at the tip to hold the skin edges together to assist with wound closure.  Once the repair was completed the wound was covered with some bacitracin ointment followed by Adaptic gauze.  Some gauze was placed in the ear and some behind the ear and then a Band-Aid was placed over the top of this followed by a Glasscock dressing to hold it all in position.  The patient was awakened and taken to the recovery room in satisfactory condition.  There were no operative complications.  He tolerated the procedure very well.  Dispo:  To PACU to be discharged home  Plan: To follow-up in the office in 6 days for suture removal.  He can leave the dressing on or can remove it in a couple days if he would like to and can change the bandage as necessary.  I have given him some Norco 5/325 mg to use for severe pain but can switch over to Tylenol when he feels its appropriate.  He will restart his apixaban in 2 days.  Stephen Stein  03/31/2023 12:22 PM

## 2023-03-31 NOTE — Anesthesia Preprocedure Evaluation (Addendum)
Anesthesia Evaluation  Patient identified by MRN, date of birth, ID band Patient awake    Airway Mallampati: III  TM Distance: <3 FB     Dental   Multiple missing upper, the few remaining teeth in upper mouth are broken off nearly to gumline:   Pulmonary former smoker   breath sounds clear to auscultation       Cardiovascular Exercise Tolerance: Poor hypertension, + CAD  + dysrhythmias Atrial Fibrillation + Valvular Problems/Murmurs MR and AI  Rhythm:Irregular Rate:Normal  RBBB LBBB A fib/flutter DOE Echo 07-15-22 EF 45-50%, mild LVH, LA mild dilation, mild MR, mild AR   Neuro/Psych    GI/Hepatic Hepatic steatosis   Endo/Other    Renal/GU      Musculoskeletal  (+) Arthritis , Osteoarthritis,    Abdominal Normal abdominal exam  (+)   Peds  Hematology   Anesthesia Other Findings Irregular heartbeat/Palpitations BPH (benign prostatic hyperplasia) Hyperlipidemia  Adrenal adenoma Hypertension  Coronary artery calcification seen on CT scan Biliary dyskinesia  Left ventricular hypokinesis Lightheadedness  Orthostatic hypotension NSVT (nonsustained ventricular tachycardia) LBBB (left bundle branch block) Sinus bradycardia  Carotid atherosclerosis, bilateral Depression  Hx of basal cell carcinoma Parkinson's disease  Aortic atherosclerosis Hepatic steatosis  Mild cognitive impairment Grade I diastolic dysfunction  S/P reverse total shoulder arthroplasty, right Constipation  Wears hearing aid in both ears Wears dentures      Reproductive/Obstetrics                             Anesthesia Physical Anesthesia Plan  ASA: 3  Anesthesia Plan: General   Post-op Pain Management:    Induction: Intravenous  PONV Risk Score and Plan: Treatment may vary due to age or medical condition  Airway Management Planned: Nasal Cannula and LMA  Additional Equipment:   Intra-op Plan:    Post-operative Plan:   Informed Consent: I have reviewed the patients History and Physical, chart, labs and discussed the procedure including the risks, benefits and alternatives for the proposed anesthesia with the patient or authorized representative who has indicated his/her understanding and acceptance.     Dental Advisory Given  Plan Discussed with: Anesthesiologist, CRNA and Surgeon  Anesthesia Plan Comments: (Patient consented for risks of anesthesia including but not limited to:  - adverse reactions to medications - risk of airway placement if required - damage to eyes, teeth, lips or other oral mucosa - nerve damage due to positioning  - sore throat or hoarseness - Damage to heart, brain, nerves, lungs, other parts of body or loss of life  Patient voiced understanding.)       Anesthesia Quick Evaluation

## 2023-04-03 ENCOUNTER — Encounter: Payer: Self-pay | Admitting: Otolaryngology

## 2023-04-04 LAB — SURGICAL PATHOLOGY

## 2023-04-21 ENCOUNTER — Encounter: Payer: Self-pay | Admitting: Family Medicine

## 2023-04-21 ENCOUNTER — Ambulatory Visit (INDEPENDENT_AMBULATORY_CARE_PROVIDER_SITE_OTHER): Payer: PPO | Admitting: Family Medicine

## 2023-04-21 VITALS — BP 120/66 | HR 55 | Temp 97.5°F | Resp 16 | Ht 70.0 in | Wt 213.8 lb

## 2023-04-21 DIAGNOSIS — E782 Mixed hyperlipidemia: Secondary | ICD-10-CM

## 2023-04-21 DIAGNOSIS — I4891 Unspecified atrial fibrillation: Secondary | ICD-10-CM | POA: Diagnosis not present

## 2023-04-21 DIAGNOSIS — I25118 Atherosclerotic heart disease of native coronary artery with other forms of angina pectoris: Secondary | ICD-10-CM | POA: Diagnosis not present

## 2023-04-21 DIAGNOSIS — F0280 Dementia in other diseases classified elsewhere without behavioral disturbance: Secondary | ICD-10-CM

## 2023-04-21 DIAGNOSIS — C44222 Squamous cell carcinoma of skin of right ear and external auricular canal: Secondary | ICD-10-CM

## 2023-04-21 DIAGNOSIS — G4710 Hypersomnia, unspecified: Secondary | ICD-10-CM

## 2023-04-21 DIAGNOSIS — I700 Atherosclerosis of aorta: Secondary | ICD-10-CM | POA: Diagnosis not present

## 2023-04-21 DIAGNOSIS — Z5181 Encounter for therapeutic drug level monitoring: Secondary | ICD-10-CM

## 2023-04-21 DIAGNOSIS — I10 Essential (primary) hypertension: Secondary | ICD-10-CM | POA: Diagnosis not present

## 2023-04-21 DIAGNOSIS — R7303 Prediabetes: Secondary | ICD-10-CM

## 2023-04-21 DIAGNOSIS — F320 Major depressive disorder, single episode, mild: Secondary | ICD-10-CM | POA: Diagnosis not present

## 2023-04-21 DIAGNOSIS — D821 Di George's syndrome: Secondary | ICD-10-CM | POA: Diagnosis not present

## 2023-04-21 DIAGNOSIS — G309 Alzheimer's disease, unspecified: Secondary | ICD-10-CM | POA: Diagnosis not present

## 2023-04-21 DIAGNOSIS — F0150 Vascular dementia without behavioral disturbance: Secondary | ICD-10-CM

## 2023-04-21 DIAGNOSIS — G20A1 Parkinson's disease without dyskinesia, without mention of fluctuations: Secondary | ICD-10-CM

## 2023-04-21 LAB — CBC WITH DIFFERENTIAL/PLATELET
Absolute Monocytes: 737 cells/uL (ref 200–950)
Eosinophils Absolute: 467 cells/uL (ref 15–500)
Lymphs Abs: 1745 cells/uL (ref 850–3900)
MCV: 93.4 fL (ref 80.0–100.0)
Monocytes Relative: 10.1 %
Neutro Abs: 4300 cells/uL (ref 1500–7800)
RDW: 13.1 % (ref 11.0–15.0)

## 2023-04-21 NOTE — Progress Notes (Unsigned)
Name: Stephen Stein   MRN: [id number]    DOB: 1941-05-24   Date:04/21/2023       Progress Note  Chief Complaint  Patient presents with   Follow-up   Hypertension   Hyperlipidemia   Depression     Subjective:   Stephen Stein is a 82 y.o. male, presents to clinic for routine f/up and med refills  He had his surgery for suspected malignant lesion to right ear, healing well, good margins, he believes it was [facility]- pathology reviewed -  DIAGNOSIS:  A. AURICLE, RIGHT; EXCISION:  - WELL-DIFFERENTIATED SQUAMOUS CELL CARCINOMA.  - SEE CANCER SUMMARY BELOW.  - SOLAR ELASTOSIS.  - SEPARATE FRAGMENTS OF CARTILAGE.   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    Hyperlipidemia: Currently treated with crestor 10 mg daily, pt reports good med compliance Last Lipids: Lab Results  Component Value Date   CHOL 128 04/25/2022   HDL 36 (L) 04/25/2022   LDLCALC 71 04/25/2022   TRIG 120 04/25/2022   CHOLHDL 3.6 04/25/2022   - Denies: Chest pain, shortness of breath, myalgias, claudication  prediabetes Lab Results  Component Value Date   HGBA1C 5.8 (H) 10/21/2022   Bradycardia/afib - no meds, no sx -denies any exertional symptoms, chest pain, syncope or near syncope He has previously done a workup for this and his heart rate tends to run in the 50s or 60s Sees cardiology Pulse Readings from Last 10 Encounters:  04/21/23 (!) 55  03/31/23 60  03/13/23 (!) 59  12/21/22 (!) 55  12/13/22 61  12/08/22 (!) 56  12/01/22 (!) 57  11/27/22 68  10/27/22 62  10/21/22 83   EPS  Parkinsons - managed by Dr.  Boykin Peek multiple medications  MDD -he is on Zoloft 100 mg reports his mood is great, phq 9 reviewed and neg today    04/21/2023    9:29 AM 03/13/2023    9:38 AM 12/07/2022    9:35 AM  Depression screen PHQ 2/9  Decreased Interest 0 0 0  Down, Depressed, Hopeless 0 0 0  PHQ - 2 Score 0 0 0  Altered sleeping 0 0 0  Tired, decreased energy 0 0 0  Change in appetite 0 0 0  Feeling bad or failure about yourself  0 0 0  Trouble concentrating 0 0 0  Moving slowly or fidgety/restless 0 0 0  Suicidal thoughts 0 0 0  PHQ-9 Score 0 0 0  Difficult doing work/chores Not difficult at all Not difficult at all Not difficult at all    He does note recently being excessively sleepy -he can be hanging out with his wife and could fall asleep while they are having a conversation He does not feel like he is at risk for falling asleep when driving because he is very hypervigilant and alert ESS done today: Epworth Sleepiness Scale:  Sitting and reading: Moderate chance of dozing Watching TV: Moderate chance of dozing Sitting, inactive in a public place (e.g. a theatre or a meeting): Moderate chance of dozing As a passenger in a car for an hour without a break: Moderate chance of dozing  Lying down to rest in the afternoon when circumstances permit: High chance of dozing Sitting and talking to someone: Slight chance of dozing Sitting quietly after a lunch without alcohol: Moderate chance of dozing In a car, while stopped for a few minutes in traffic: Slight chance of dozing  Total score: 15      Current Outpatient Medications:    acetaminophen (TYLENOL) 500 MG tablet, Take 1,000 mg by mouth every 6 (six) hours as needed for moderate pain., Disp: , Rfl:    Azelastine HCl 137 MCG/SPRAY SOLN, PLACE 2 SPRAYS INTO BOTH NOSTRILS 2 (TWO) TIMES DAILY, Disp: 30 mL, Rfl: 3   carbidopa-levodopa (SINEMET IR) 25-100 MG tablet, Take 2 tablets by mouth 3 (three) times daily., Disp: , Rfl:    Cholecalciferol  (VITAMIN D3 PO), Take 1 tablet by mouth daily., Disp: , Rfl:    docusate sodium (COLACE) 100 MG capsule, Take 1 capsule (100 mg total) by mouth 2 (two) times daily., Disp: 30 capsule, Rfl: 0   meloxicam (MOBIC) 15 MG tablet, Take 15 mg by mouth daily as needed., Disp: , Rfl:    memantine (NAMENDA) 5 MG tablet, Take 5 mg by mouth 2 (two) times daily., Disp: , Rfl:    mupirocin ointment (BACTROBAN) 2 %, Apply 1 Application topically 2 (two) times daily., Disp: 22 g, Rfl: 0   rOPINIRole (REQUIP) 1 MG tablet, Take 1 mg by mouth 4 (four) times daily., Disp: , Rfl:    rosuvastatin (CRESTOR) 10 MG tablet, TAKE 1 TABLET BY MOUTH EVERY DAY, Disp: 90 tablet, Rfl: 0   sertraline (ZOLOFT) 100 MG tablet, Take 100 mg by mouth daily., Disp: , Rfl:    tamsulosin (FLOMAX) 0.4 MG CAPS capsule, TAKE 1 CAPSULE BY MOUTH EVERYDAY AT BEDTIME, Disp: 90 capsule, Rfl: 3   carbidopa-levodopa (SINEMET CR) 50-200 MG tablet, Take 1 tablet by mouth at bedtime., Disp: , Rfl:    Clobetasol Prop Emollient Base (CLOBETASOL PROPIONATE E) 0.05 % emollient cream, Apply 1 Application topically 2 (two) times daily. Up to 5 days per week prn to affected areas - avoid face, groin, underarms, Disp: 60 g, Rfl: 1  Patient Active Problem List   Diagnosis Date Noted   Infection of skin due to methicillin resistant Staphylococcus aureus (MRSA) 12/09/2022   Open wound of right hip and thigh with complication 12/09/2022   Osteoarthritis of ankle and foot 12/08/2022   Prediabetes 10/21/2022   RLS (restless legs syndrome) 10/21/2022   Neuropathy 10/21/2022   At high risk for falls 10/21/2022   Atrial fibrillation (HCC) 09/05/2022   Traumatic blister of ankle 09/05/2022   Allergic rhinitis 10/21/2021   Di George's syndrome (HCC) 04/20/2021   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HCC) 04/20/2021   Parkinson's disease 10/20/2020   Carotid atherosclerosis, bilateral 02/20/2019   Vitamin B12 deficiency 10/15/2018   Left bundle  branch block 06/25/2018   Second degree AV block, Mobitz type I 06/25/2018   Aortic calcification (HCC) 06/15/2018   Adrenal adenoma 05/09/2017   Pulmonary nodules 04/19/2017   BPH (benign prostatic hyperplasia) 04/19/2017   Pain in left arm 01/20/2017   Coronary artery disease of native artery of native heart with stable angina pectoris (HCC) 03/25/2016   Hyperlipidemia 07/07/2015   Essential hypertension 07/07/2015    Past Surgical History:  Procedure Laterality Date   ANKLE FRACTURE SURGERY Left 09/28/2022   [facility]   CATARACT EXTRACTION W/PHACO Left 12/09/2020   Procedure: CATARACT EXTRACTION PHACO AND INTRAOCULAR LENS PLACEMENT (IOC) LEFT  6.94 01:09.7 9.9% ;  Surgeon: Lockie Mola, MD;  Location: [facility];  Service: Ophthalmology;  Laterality: Left;   CATARACT EXTRACTION W/PHACO Right 12/23/2020   Procedure: CATARACT EXTRACTION PHACO AND INTRAOCULAR LENS PLACEMENT (IOC) RIGHT MALYUGIN;  Surgeon: Lockie Mola, MD;  Location: [facility];  Service: Ophthalmology;  Laterality: Right;  8.44 1:14.6 11.3%   CHOLECYSTECTOMY N/A 09/13/2018   Procedure: LAPAROSCOPIC CHOLECYSTECTOMY;  Surgeon: Leafy Ro, MD;  Location: [facility];  Service: General;  Laterality: N/A;   COLONOSCOPY  12/26/2006   ESOPHAGOGASTRODUODENOSCOPY (EGD) WITH PROPOFOL N/A 08/20/2018   Procedure: ESOPHAGOGASTRODUODENOSCOPY (EGD) WITH PROPOFOL;  Surgeon: Midge Minium, MD;  Location: [facility];  Service: Endoscopy;  Laterality: N/A;   EXCISION MASS HEAD Right 03/31/2023   Procedure: EXCISION LESION AURICLE;  Surgeon: Vernie Murders, MD;  Location: [facility];  Service: ENT;  Laterality: Right;   TONSILLECTOMY AND ADENOIDECTOMY  12/26/1953   TOTAL SHOULDER ARTHROPLASTY Left 09/11/2019   Procedure: TOTAL SHOULDER ARTHROPLASTY;  Surgeon: Lyndle Herrlich, MD;  Location: [facility];  Service: Orthopedics;  Laterality: Left;   TOTAL SHOULDER ARTHROPLASTY Right 08/02/2021    Procedure: TOTAL SHOULDER ARTHROPLASTY;  Surgeon: Lyndle Herrlich, MD;  Location: [facility];  Service: Orthopedics;  Laterality: Right;    Family History  Problem Relation Age of Onset   Cancer Mother        lung   Heart disease Father    Hearing loss Sister        coclear implant   Hearing loss Brother    Cancer Paternal Grandmother    Cancer Son        colon cancer   Colon cancer Neg Hx    Stomach cancer Neg Hx     Social History   Tobacco Use   Smoking status: Former    Packs/day: 1.50    Years: 35.00    Additional pack years: 0.00    Total pack years: 52.50    Types: Cigarettes    Quit date: 06/22/1992    Years since quitting: 30.8   Smokeless tobacco: Former    Quit date: 06/22/1992  Vaping Use   Vaping Use: Never used  Substance Use Topics   Alcohol use: Not Currently    Alcohol/week: 1.0 standard drink of alcohol    Types: 1 Cans of beer per week    Comment: occasional   Drug use: No     No Known Allergies  Health Maintenance  Topic Date Due   COVID-19 Vaccine (5 - 2023-24 season) 05/07/2023 (Originally 08/26/2022)   Zoster Vaccines- Shingrix (2 of 2) 07/21/2023 (Originally 03/18/2021)   INFLUENZA VACCINE  07/27/2023   Medicare Annual Wellness (AWV)  12/08/2023   Pneumonia Vaccine 20+ Years old  Completed   HPV VACCINES  Aged Out   DTaP/Tdap/Td  Discontinued    Chart Review Today: I personally reviewed active problem list, medication list, allergies, family history, social history, health maintenance, notes from last encounter, lab results, imaging with the patient/caregiver today.   Review of Systems  Constitutional: Negative.   HENT: Negative.    Eyes: Negative.   Respiratory: Negative.    Cardiovascular: Negative.   Gastrointestinal: Negative.   Endocrine: Negative.   Genitourinary: Negative.   Musculoskeletal: Negative.   Skin: Negative.   Allergic/Immunologic: Negative.   Neurological: Negative.   Hematological: Negative.    Psychiatric/Behavioral: Negative.    All other systems reviewed and are negative.    Objective:   Vitals:   04/21/23 1610  BP: 120/66  Pulse: (!) 55  Resp: 16  Temp: (!) 97.5 F (36.4 C)  TempSrc: Oral  SpO2: 97%  Weight: 213 lb 12.8 oz (97 kg)  Height: 5\' 10"  (1.778 m)    Body mass index is 30.68 kg/m.  Physical Exam Vitals and nursing note reviewed.  Constitutional:      General: He is not in acute distress.    Appearance: Normal appearance. He is well-developed. He is not ill-appearing, toxic-appearing or diaphoretic.  HENT:     Head: Normocephalic and atraumatic.     Right Ear: Tympanic membrane, ear canal and external ear normal.     Left Ear: Tympanic membrane, ear canal and external ear normal.     Ears:     Comments: Right pinna healing scar well apearing    Nose: Nose normal.     Mouth/Throat:     Mouth: Mucous membranes are moist.     Pharynx: Oropharynx is clear.  Eyes:     General:        Right eye: No discharge.        Left eye: No discharge.     Conjunctiva/sclera: Conjunctivae normal.  Neck:     Trachea: No tracheal deviation.  Cardiovascular:     Rate and Rhythm: Regular rhythm. Bradycardia present.     Pulses: Normal pulses.     Heart sounds: Normal heart sounds. No murmur heard.    No friction rub. No gallop.  Pulmonary:     Effort: Pulmonary effort is normal. No respiratory distress.     Breath sounds: Normal breath sounds. No stridor. No wheezing, rhonchi or rales.  Abdominal:     General: Bowel sounds are normal.     Palpations: Abdomen is soft.  Musculoskeletal:     Right lower leg: No edema.     Left lower leg: No edema.  Skin:    General: Skin is warm and dry.     Findings: No rash.  Neurological:     Mental Status: He is alert.     Motor: No abnormal muscle tone.     Coordination: Coordination normal.  Psychiatric:        Mood and Affect: Mood normal.        Behavior: Behavior normal.         Assessment & Plan:    Problem List Items Addressed This Visit       Cardiovascular and Mediastinum   Essential hypertension - Primary (Chronic)    Not on meds BP at goal today BP Readings from Last 3 Encounters:  04/21/23 120/66  03/31/23 (!) 146/77  03/13/23 116/64  Continue diet/lifestyle efforts       Relevant Orders   COMPLETE METABOLIC PANEL WITH GFR (Completed)   Coronary artery disease of native artery of native heart with stable angina pectoris [facility])    Per cardiology, no current concerning sx      Aortic calcification (HCC)    On statin, monitoring, labs today      Atrial fibrillation (HCC)    Not on meds due to bradycardia Per cardiology      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 Sherryll Burger  On Sinemet and  Namenda        Other   Hyperlipidemia    Good compliance with rosuvastatin, due for labs      Relevant Orders   COMPLETE METABOLIC PANEL WITH GFR (Completed)   Lipid panel (Completed)   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HCC)    Pt pleasant, mood overall stable and well controlled with zoloft - managed by specialists Phq 9 reviewed today and neg      Prediabetes    Recheck labs      Relevant Orders   COMPLETE METABOLIC PANEL WITH GFR (Completed)   Hemoglobin A1c (Completed)   Lipid panel (Completed)   RESOLVED: Di George's syndrome (HCC)   Other Visit Diagnoses     Encounter for medication monitoring       Relevant Orders   COMPLETE METABOLIC PANEL WITH GFR (Completed)   CBC with Differential/Platelet (Completed)   Hemoglobin A1c (Completed)   Lipid panel (Completed)   Excessive sleepiness       ESS positive - I have recommended sleep study for further eval - pt can  do with Dr. Sherryll Burger or I can order   SCC (squamous cell carcinoma), ear, right       excellent healing/recovery, good margins - he follows with dermatology        Return in about 6 months (around 10/21/2023) for Routine follow-up.   Danelle Berry, PA-C 04/21/23 9:55 AM

## 2023-04-22 LAB — LIPID PANEL
Cholesterol: 116 mg/dL (ref <200)
HDL: 44 mg/dL (ref >=40)
LDL Cholesterol (Calc): 55 mg/dL (calc)
Non-HDL Cholesterol (Calc): 72 mg/dL (calc) (ref <130)
Total CHOL/HDL Ratio: 2.6 (calc) (ref <5.0)
Triglycerides: 89 mg/dL (ref <150)

## 2023-04-22 LAB — CBC WITH DIFFERENTIAL/PLATELET
Basophils Absolute: 51 cells/uL (ref 0–200)
Basophils Relative: 0.7 %
Eosinophils Relative: 6.4 %
HCT: 38.2 % — ABNORMAL LOW (ref 38.5–50.0)
Hemoglobin: 12.8 g/dL — ABNORMAL LOW (ref 13.2–17.1)
MCH: 31.3 pg (ref 27.0–33.0)
MCHC: 33.5 g/dL (ref 32.0–36.0)
MPV: 10.1 fL (ref 7.5–12.5)
Neutrophils Relative %: 58.9 %
Platelets: 228 10*3/uL (ref 140–400)
RBC: 4.09 10*6/uL — ABNORMAL LOW (ref 4.20–5.80)
Total Lymphocyte: 23.9 %
WBC: 7.3 10*3/uL (ref 3.8–10.8)

## 2023-04-22 LAB — COMPLETE METABOLIC PANEL WITH GFR
AG Ratio: 1.7 (calc) (ref 1.0–2.5)
ALT: 10 U/L (ref 9–46)
AST: 16 U/L (ref 10–35)
Albumin: 4.3 g/dL (ref 3.6–5.1)
Alkaline phosphatase (APISO): 27 U/L — ABNORMAL LOW (ref 35–144)
BUN: 20 mg/dL (ref 7–25)
CO2: 28 mmol/L (ref 20–32)
Calcium: 9.6 mg/dL (ref 8.6–10.3)
Chloride: 102 mmol/L (ref 98–110)
Creat: 1.06 mg/dL (ref 0.70–1.22)
Globulin: 2.6 g/dL (calc) (ref 1.9–3.7)
Glucose, Bld: 97 mg/dL (ref 65–99)
Potassium: 4.7 mmol/L (ref 3.5–5.3)
Sodium: 138 mmol/L (ref 135–146)
Total Bilirubin: 0.5 mg/dL (ref 0.2–1.2)
Total Protein: 6.9 g/dL (ref 6.1–8.1)
eGFR: 71 mL/min/{1.73_m2} (ref >=60)

## 2023-04-22 LAB — HEMOGLOBIN A1C
Hgb A1c MFr Bld: 6.1 % of total Hgb — ABNORMAL HIGH (ref <5.7)
Mean Plasma Glucose: 128 mg/dL
eAG (mmol/L): 7.1 mmol/L

## 2023-04-24 ENCOUNTER — Encounter: Payer: Self-pay | Admitting: Family Medicine

## 2023-04-24 DIAGNOSIS — F0150 Vascular dementia without behavioral disturbance: Secondary | ICD-10-CM | POA: Insufficient documentation

## 2023-04-24 NOTE — Assessment & Plan Note (Signed)
Per neurology - Dr. Sherryll Burger  On Sinemet and Candiss Norse

## 2023-04-24 NOTE — Assessment & Plan Note (Signed)
On statin, monitoring, labs today

## 2023-04-24 NOTE — Assessment & Plan Note (Signed)
Per neurology - on multiple meds, pt here alone - we have monitored MMSE and over the past couple years he's appeared fairly stable

## 2023-04-24 NOTE — Assessment & Plan Note (Signed)
Not on meds BP at goal today BP Readings from Last 3 Encounters:  04/21/23 120/66  03/31/23 (!) 146/77  03/13/23 116/64   Continue diet/lifestyle efforts

## 2023-04-24 NOTE — Assessment & Plan Note (Signed)
Pt pleasant, mood overall stable and well controlled with zoloft - managed by specialists Phq 9 reviewed today and neg

## 2023-04-24 NOTE — Assessment & Plan Note (Signed)
Good compliance with rosuvastatin, due for labs

## 2023-04-24 NOTE — Assessment & Plan Note (Signed)
Recheck labs 

## 2023-04-24 NOTE — Assessment & Plan Note (Signed)
Per cardiology, no current concerning sx

## 2023-04-24 NOTE — Assessment & Plan Note (Signed)
Not on meds due to bradycardia Per cardiology

## 2023-05-12 DIAGNOSIS — G309 Alzheimer's disease, unspecified: Secondary | ICD-10-CM | POA: Diagnosis not present

## 2023-05-12 DIAGNOSIS — F0150 Vascular dementia without behavioral disturbance: Secondary | ICD-10-CM | POA: Diagnosis not present

## 2023-05-12 DIAGNOSIS — F0280 Dementia in other diseases classified elsewhere without behavioral disturbance: Secondary | ICD-10-CM | POA: Diagnosis not present

## 2023-05-13 ENCOUNTER — Other Ambulatory Visit: Payer: Self-pay | Admitting: Family Medicine

## 2023-05-13 DIAGNOSIS — E782 Mixed hyperlipidemia: Secondary | ICD-10-CM

## 2023-05-15 NOTE — Telephone Encounter (Signed)
Requested Prescriptions  Pending Prescriptions Disp Refills   rosuvastatin (CRESTOR) 10 MG tablet [Pharmacy Med Name: ROSUVASTATIN CALCIUM 10 MG TAB] 90 tablet 0    Sig: TAKE 1 TABLET BY MOUTH EVERY DAY     Cardiovascular:  Antilipid - Statins 2 Failed - 05/13/2023  1:12 AM      Failed - Lipid Panel in normal range within the last 12 months    Cholesterol, Total  Date Value Ref Range Status  06/01/2016 110 100 - 199 mg/dL Final   Cholesterol  Date Value Ref Range Status  04/21/2023 116 <200 mg/dL Final   LDL Cholesterol (Calc)  Date Value Ref Range Status  04/21/2023 55 mg/dL (calc) Final    Comment:    Reference range: <100 . Desirable range <100 mg/dL for primary prevention;   <70 mg/dL for patients with CHD or diabetic patients  with > or = 2 CHD risk factors. [name] LDL-C is now calculated using the Martin-Hopkins  calculation, which is a validated novel method providing  better accuracy than the Friedewald equation in the  estimation of LDL-C.  Horald Pollen et al. Lenox Ahr. 1610;960(45): 2061-2068  (http://education.[employer].com/faq/FAQ164)    HDL  Date Value Ref Range Status  04/21/2023 44 > OR = 40 mg/dL Final  40/98/1191 39 (L) >39 mg/dL Final   Triglycerides  Date Value Ref Range Status  04/21/2023 89 <150 mg/dL Final         Passed - Cr in normal range and within 360 days    Creat  Date Value Ref Range Status  04/21/2023 1.06 0.70 - 1.22 mg/dL Final         Passed - Patient is not pregnant      Passed - Valid encounter within last 12 months    Recent Outpatient Visits           3 weeks ago Essential hypertension   [facility] Danelle Berry, PA-C   2 months ago Cellulitis of right ear   [facility] Danelle Berry, PA-C   5 months ago Encounter for [employer] annual wellness exam   [facility], Oswaldo Conroy, PA-C   5 months ago Cellulitis and abscess of right leg   [facility] Danelle Berry, PA-C   5 months ago Cellulitis and abscess of right leg   [facility] Danelle Berry, [place]       Future Appointments             In 5 months Deirdre Evener, MD [facility]   In 7 months  [facility], [facility]

## 2023-06-06 DIAGNOSIS — Z7901 Long term (current) use of anticoagulants: Secondary | ICD-10-CM | POA: Diagnosis not present

## 2023-06-06 DIAGNOSIS — S82842S Displaced bimalleolar fracture of left lower leg, sequela: Secondary | ICD-10-CM | POA: Diagnosis not present

## 2023-06-06 DIAGNOSIS — Z981 Arthrodesis status: Secondary | ICD-10-CM | POA: Diagnosis not present

## 2023-06-09 ENCOUNTER — Other Ambulatory Visit
Admission: RE | Admit: 2023-06-09 | Discharge: 2023-06-09 | Disposition: A | Discharge status: Home / Self Care | Payer: PPO | Attending: Urology | Admitting: Urology

## 2023-06-09 ENCOUNTER — Encounter: Payer: Self-pay | Admitting: Urology

## 2023-06-09 ENCOUNTER — Other Ambulatory Visit: Payer: Self-pay

## 2023-06-09 ENCOUNTER — Ambulatory Visit (INDEPENDENT_AMBULATORY_CARE_PROVIDER_SITE_OTHER): Payer: PPO | Admitting: Urology

## 2023-06-09 VITALS — BP 138/59 | HR 38 | Ht 70.0 in | Wt 215.0 lb

## 2023-06-09 DIAGNOSIS — N39498 Other specified urinary incontinence: Secondary | ICD-10-CM | POA: Insufficient documentation

## 2023-06-09 DIAGNOSIS — N401 Enlarged prostate with lower urinary tract symptoms: Secondary | ICD-10-CM | POA: Diagnosis not present

## 2023-06-09 DIAGNOSIS — L723 Sebaceous cyst: Secondary | ICD-10-CM

## 2023-06-09 LAB — URINALYSIS, COMPLETE (UACMP) WITH MICROSCOPIC
Bacteria, UA: NONE SEEN
Bilirubin Urine: NEGATIVE
Glucose, UA: NEGATIVE mg/dL
Ketones, ur: NEGATIVE mg/dL
Leukocytes,Ua: NEGATIVE
Nitrite: NEGATIVE
Protein, ur: NEGATIVE mg/dL
RBC / HPF: NONE SEEN RBC/hpf (ref 0–5)
Specific Gravity, Urine: 1.015 (ref 1.005–1.030)
Squamous Epithelial / HPF: NONE SEEN /HPF (ref 0–5)
WBC, UA: NONE SEEN WBC/hpf (ref 0–5)
pH: 6.5 (ref 5.0–8.0)

## 2023-06-09 NOTE — Progress Notes (Signed)
Stephen Stein,acting as a scribe for Stephen Scotland, MD.,have documented all relevant documentation on the behalf of Stephen Scotland, MD,as directed by  Stephen Scotland, MD while in the presence of Stephen Scotland, MD.  06/09/2023 3:29 PM   Stephen Stein [name] May 26, 1941 [id number]  Referring provider: Danelle Berry, PA-C [address] [place],  [place] [address]  Chief Complaint  Patient presents with   Urinary Incontinence    HPI: 82 year-old male who is referred for further evaluation of a testicular mass. He does not have any imaging at this time. He reports that the mass started about the size of a BB approximately 2 years ago and has significantly increased in size over the past one to two weeks, causing discomfort. He denies any associated symptoms such as fever or chills.   Additionally, he reports occasional urge incontinence and nocturia x2-3. He describes his urinary stream as sometimes weak. He is currently taking Flomax for his prostate issues. He is pre-diabetic and has a history of prostate problems for many years.   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 08/2018 s/p lap chole.   BPH (benign prostatic hyperplasia)    Carotid atherosclerosis, bilateral    a. 01/2019 Carotid U/S: <50% bilat ICA stenosis.   Constipation 10/21/2021   Coronary artery calcification seen on CT scan    a. 11/2015 Cardiac CT: Cor Ca2+ = 892 (80th %'ile). Diffuse Ca2+ in all 3 major coronary arteries; b. 02/2016 MV: HTN response to exercise. Abnl ecg w/ horizontal inflat ST depression, but no ischemia/infarct on imaging; c. 09/2018 MV: EF 64%, no ischemia. Very sm region of mild distal antsept defect, likely 2/2 lbbb.   Depression    Grade I diastolic dysfunction    Hepatic steatosis    Hx of basal cell carcinoma 08/01/2019   R chest parasternal    Hyperlipidemia    Hypertension    Irregular heartbeat/Palpitations    a. Has been taking Atenolol for 20 years   LBBB (left bundle branch block)    Left ventricular hypokinesis    a. 09/2018 Echo: EF 45-50%, mod LVH, diff HK. Gr1 DD. Triv AI. Mildly dil Ao root. Mildly reduced RV fxn.   Lightheadedness    Mild cognitive impairment    NSVT (nonsustained ventricular tachycardia) (HCC)    a. 11/2018 Zio monitor - 13 beats of NSVT--asymptomatic.   Orthostatic hypotension    a. 11/2018 pressures improved w/ midodrine/florinef.   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 11/2018 Zio monitor: Avg HR 57 (min 28). Intermittent Mobitz I.   Wears dentures    Partial upper   Wears hearing aid in both ears     Surgical History: Past Surgical History:  Procedure Laterality Date   ANKLE FRACTURE SURGERY Left 09/28/2022   [facility]   CATARACT EXTRACTION W/PHACO Left 12/09/2020   Procedure: CATARACT EXTRACTION PHACO AND INTRAOCULAR LENS PLACEMENT (IOC) LEFT 6.94 01:09.7 9.9% ;  Surgeon: Lockie Mola, MD;  Location: [facility];  Service: Ophthalmology;  Laterality: Left;   CATARACT EXTRACTION W/PHACO Right 12/23/2020   Procedure: CATARACT EXTRACTION PHACO AND INTRAOCULAR LENS PLACEMENT (IOC) RIGHT MALYUGIN;  Surgeon: Lockie Mola, MD;  Location: [facility];  Service: Ophthalmology;  Laterality: Right;  8.44 1:14.6 11.3%   CHOLECYSTECTOMY N/A 09/13/2018   Procedure: LAPAROSCOPIC CHOLECYSTECTOMY;  Surgeon: Leafy Ro, MD;  Location: [facility] OR[facility];  Service: General;  Laterality: N/A;   COLONOSCOPY  12/26/2006   ESOPHAGOGASTRODUODENOSCOPY (EGD) WITH PROPOFOL N/A 08/20/2018   Procedure: ESOPHAGOGASTRODUODENOSCOPY (EGD) WITH PROPOFOL;  Surgeon: Midge Minium, MD;  Location: [facility];  Service: Endoscopy;  Laterality: N/A;   EXCISION MASS HEAD Right 03/31/2023   Procedure: EXCISION LESION AURICLE;  Surgeon:  Vernie Murders, MD;  Location: [facility];  Service: ENT;  Laterality: Right;   TONSILLECTOMY AND ADENOIDECTOMY  12/26/1953   TOTAL SHOULDER ARTHROPLASTY Left 09/11/2019   Procedure: TOTAL SHOULDER ARTHROPLASTY;  Surgeon: Lyndle Herrlich, MD;  Location: [facility];  Service: Orthopedics;  Laterality: Left;   TOTAL SHOULDER ARTHROPLASTY Right 08/02/2021   Procedure: TOTAL SHOULDER ARTHROPLASTY;  Surgeon: Lyndle Herrlich, MD;  Location: [facility];  Service: Orthopedics;  Laterality: Right;    Home Medications:  Allergies as of 06/09/2023   No Known Allergies      Medication List        Accurate as of June 09, 2023  3:29 PM. If you have any questions, ask your nurse or doctor.          STOP taking these medications    Clobetasol Prop Emollient Base 0.05 % emollient cream Commonly known as: Clobetasol Propionate E Stopped by: Stephen Scotland, MD   mupirocin ointment 2 % Commonly known as: BACTROBAN Stopped by: Stephen Scotland, MD       TAKE these medications    acetaminophen 500 MG tablet Commonly known as: TYLENOL Take 1,000 mg by mouth every 6 (six) hours as needed for moderate pain.   Azelastine HCl 137 MCG/SPRAY Soln PLACE 2 SPRAYS INTO BOTH NOSTRILS 2 (TWO) TIMES DAILY   carbidopa-levodopa 25-100 MG tablet Commonly known as: SINEMET IR Take by mouth. What changed: Another medication with the same name was removed. Continue taking this medication, and follow the directions you see here. Changed by: Stephen Scotland, MD   docusate sodium 100 MG capsule Commonly known as: COLACE Take 1 capsule (100 mg total) by mouth 2 (two) times daily.   Eliquis 5 MG Tabs tablet Generic drug: apixaban Take 5 mg by mouth 2 (two) times daily.   meloxicam 15 MG tablet Commonly known as: MOBIC Take 15 mg by mouth daily as needed.   memantine 5 MG tablet Commonly known as: NAMENDA Take 5 mg by mouth 2 (two) times daily.   rOPINIRole 1 MG tablet Commonly known as:  REQUIP Take 1 mg by mouth 4 (four) times daily.   rosuvastatin 10 MG tablet Commonly known as: CRESTOR TAKE 1 TABLET BY MOUTH EVERY DAY   sertraline 100 MG tablet Commonly known as: ZOLOFT Take 100 mg by mouth daily.   tamsulosin 0.4 MG Caps capsule Commonly known as: FLOMAX TAKE 1 CAPSULE BY MOUTH EVERYDAY AT BEDTIME   VITAMIN D3 PO Take 1 tablet by mouth daily.       Family History: Family History  Problem Relation Age of Onset   Cancer Mother        lung   Heart disease Father    Hearing loss Sister        coclear implant   Hearing loss Brother    Cancer Paternal Grandmother    Cancer Son        colon cancer   Colon cancer Neg Hx    Stomach cancer Neg Hx  Social History:  reports that he quit smoking about 30 years ago. His smoking use included cigarettes. He has a 52.50 pack-year smoking history. He quit smokeless tobacco use about 30 years ago. He reports that he does not currently use alcohol after a past usage of about 1.0 standard drink of alcohol per week. He reports that he does not use drugs.   Physical Exam: BP (!) 138/59 (BP Location: Left Arm, Patient Position: Sitting, Cuff Size: Large)   Pulse (!) 38   Ht 5\' 10"  (1.778 m)   Wt 215 lb (97.5 kg)   BMI 30.85 kg/m   Constitutional:  Alert and oriented, No acute distress. HEENT: [place] AT, moist mucus membranes.  Trachea midline, no masses. GU: Bilaterally descended testicles, normal size phallus but orthotopic meatus. On the left side of the scrotum, there is a large sebaceous cyst approximately 3 cm in size with whitish material just below the epithelial layer. Posterior to this, there is a more spongy, larger mass measuring approximately 8 cm, which appears to be connected to the cyst. No warmth, erythema, or drainage noted.  Neurologic: Grossly intact, no focal deficits, moving all 4 extremities. Psychiatric: Normal mood and affect.  Assessment & Plan:    1. Sebaceous cyst on scrotum - Does not  appear to be any infections.  - It is bothersome to him and we will plan for surgical excision - We will need cardiac clearance to hold his Eliquis - He is agreeable to this plan  2. BPH - Progressive urinary symptoms, including occasional urge incontinence and nocturia x3, which is bothersome to him - We discussed his options, including optimization of clinical therapy versus consideration of outlet procedure. - He is wanting to consider an outlet procedure and we will plan to pursue cystoscopy/TRUS. He will think about this and let us know.  - He is currently on Flomax and we should continue this medication.  Return for possible Cysto/TRUS.  [facility] [address] [place], [place] [address] [phone]

## 2023-06-12 ENCOUNTER — Other Ambulatory Visit: Payer: Self-pay | Admitting: Urology

## 2023-06-12 DIAGNOSIS — N5089 Other specified disorders of the male genital organs: Secondary | ICD-10-CM

## 2023-06-12 NOTE — Progress Notes (Unsigned)
Surgical Physician Order Form [facility]  * Scheduling expectation : Next Available  *Length of Case:   *Clearance needed: yes- cardiology to hold eliquis (afib)  *Anticoagulation Instructions: Hold all anticoagulants  *Aspirin Instructions: Hold Aspirin  *Post-op visit Date/Instructions:   2 weeks for post op check- sam or shannon  *Diagnosis:  scrotal mass  *Procedure: excision of scrotal mass    Additional orders: N/A  -Admit type: Observation  -Anesthesia: General  -VTE Prophylaxis Standing Order SCD's       Other:   -Standing Lab Orders Per Anesthesia    Lab other: None  -Standing Test orders EKG/Chest x-ray per Anesthesia       Test other:   - Medications:  Ancef 2gm IV  -Other orders:  N/A

## 2023-06-13 ENCOUNTER — Telehealth: Payer: Self-pay

## 2023-06-13 NOTE — Telephone Encounter (Signed)
Tried calling patient to set up surgery, no answer. Will try again. Did leave detailed message to call back.

## 2023-06-14 ENCOUNTER — Encounter: Payer: PPO | Admitting: Urology

## 2023-06-14 ENCOUNTER — Telehealth: Payer: Self-pay | Admitting: *Deleted

## 2023-06-14 NOTE — Progress Notes (Signed)
   [facility] Surgical Posting Form  Surgery Date: Date: 07/13/2023  Surgeon: Dr. [name], MD  Inpt ( No  )   Outpt (Yes)   Obs ( No  )   Diagnosis: N50.89 Scrotal Mass  -CPT: 55150  Surgery: Excision of Scrotal Mass  Stop Anticoagulations: Yes and also hold ASA  Cardiac/Medical/Pulmonary Clearance needed: yes to hold Eliquis  Clearance needed from Dr: Mariah Milling   Clearance request sent on: Date: 06/14/23  *Orders entered into EPIC  Date: 06/14/23   *Case booked in EPIC  Date: 06/14/23  *Notified pt of Surgery: Date: 06/14/23  PRE-OP UA & CX: no  *Placed into Prior Authorization Work [place] Date: 06/14/23  Assistant/laser/rep:No

## 2023-06-14 NOTE — Progress Notes (Signed)
  Phone Number: [phone] for Surgical Coordinator Fax Number: [phone]  REQUEST FOR SURGICAL CLEARANCE       Date: Date: 06/14/23  Faxed to: Dr. Mariah Milling, MD  Surgeon: Dr. [name], MD     Date of Surgery: Thursday July 18th, 2024  Operation: Excision of Scrotal Mass   Anesthesia Type: General   Diagnosis: Scrotal Mass  Patient Requires:   Cardiac / Vascular Clearance : Yes  Reason: Would like for patient to hold Eliquis prior to surgery  Risk Assessment:    Low   []       Moderate   []     High   []           This patient is optimized for surgery  YES []       NO   []    I recommend further assessment/workup prior to surgery. YES []      NO  []   Appointment scheduled for: _______________________   Further recommendations: ____________________________________     Physician Signature:__________________________________   Printed Name: ________________________________________   Date: _________________

## 2023-06-14 NOTE — Telephone Encounter (Signed)
I spoke with Stephen Stein. We have discussed possible surgery dates and Thursday July 18th, 2024 was agreed upon by all parties. Patient given information about surgery date, what to expect pre-operatively and post operatively.  We discussed that a Pre-Admission Testing office will be calling to set up the pre-op visit that will take place prior to surgery, and that these appointments are typically done over the phone with a Pre-Admissions RN. Informed patient that our office will communicate any additional care to be provided after surgery. Patients questions or concerns were discussed during our call. Advised to call our office should there be any additional information, questions or concerns that arise. Patient verbalized understanding.

## 2023-06-14 NOTE — Telephone Encounter (Signed)
   Pre-operative Risk Assessment    Patient Name: Stephen Stein  DOB: 1941/11/30 MRN: [id number]      Request for Surgical Clearance    Procedure:   EXCISION OF SCROTAL MASS  Date of Surgery:  Clearance 07/13/23                                 Surgeon:  DR. [name] Surgeon's Group or Practice Name:  [facility] Phone number:  [phone] Fax number:  [phone]   Type of Clearance Requested:   - Medical  - Pharmacy:  Hold Apixaban (Eliquis) NOT INDICATED HOW LONG   Type of Anesthesia:  General    Additional requests/questions:    Wilhemina Cash   06/14/2023, 4:22 PM

## 2023-06-15 NOTE — Telephone Encounter (Signed)
PRIMARY CARD IS DR. Mariah Milling. Left message to cal back to set up tele pre op appt.

## 2023-06-15 NOTE — Telephone Encounter (Signed)
   Name: Stephen Stein  DOB: December 09, 1941  MRN: [id number]  Primary Cardiologist: None   Preoperative team, please contact this patient and set up a phone call appointment for further preoperative risk assessment. Please obtain consent and complete medication review. Thank you for your help.  I confirm that guidance regarding antiplatelet and oral anticoagulation therapy has been completed and, if necessary, noted below.  Patient with diagnosis of afib on Eliquis for anticoagulation.     Procedure:  EXCISION OF SCROTAL MASS  Date of procedure: 07/13/23     CHA2DS2-VASc Score = 4   This indicates a 4.8% annual risk of stroke. The patient's score is based upon: CHF History: 0 HTN History: 1 Diabetes History: 0 Stroke History: 0 Vascular Disease History: 1 Age Score: 2 Gender Score: 0       CrCl 51ml/min   Per office protocol, patient can hold Eliquis for 2 days prior to procedure.   Ronney Asters, NP 06/15/2023, 10:52 AM  [facility]

## 2023-06-15 NOTE — Telephone Encounter (Signed)
Patient with diagnosis of afib on Eliquis for anticoagulation.    Procedure:  EXCISION OF SCROTAL MASS  Date of procedure: 07/13/23   CHA2DS2-VASc Score = 4   This indicates a 4.8% annual risk of stroke. The patient's score is based upon: CHF History: 0 HTN History: 1 Diabetes History: 0 Stroke History: 0 Vascular Disease History: 1 Age Score: 2 Gender Score: 0      CrCl 59ml/min  Per office protocol, patient can hold Eliquis for 2 days prior to procedure.    **This guidance is not considered finalized until pre-operative APP has relayed final recommendations.**

## 2023-06-16 NOTE — Telephone Encounter (Signed)
2nd attempt to call back to schedule a tele pre op appt.

## 2023-06-19 ENCOUNTER — Telehealth: Payer: Self-pay | Admitting: *Deleted

## 2023-06-19 ENCOUNTER — Telehealth: Payer: Self-pay

## 2023-06-19 NOTE — Telephone Encounter (Signed)
I was able to reach pt on # [phone]. Pt has been scheduled for tele pre op appt 06/30/23 @ 10 am. Med rec and consent are done. I will update all parties involved.      Patient Consent for Virtual Visit        Stephen Stein has provided verbal consent on 06/19/2023 for a virtual visit (video or telephone).   CONSENT FOR VIRTUAL VISIT FOR:  Stephen Stein  By participating in this virtual visit I agree to the following:  I hereby voluntarily request, consent and authorize [facility] and its employed or contracted physicians, physician assistants, nurse practitioners or other licensed health care professionals (the Practitioner), to provide me with telemedicine health care services (the "Services") as deemed necessary by the treating Practitioner. I acknowledge and consent to receive the Services by the Practitioner via telemedicine. I understand that the telemedicine visit will involve communicating with the Practitioner through live audiovisual communication technology and the disclosure of certain medical information by electronic transmission. I acknowledge that I have been given the opportunity to request an in-person assessment or other available alternative prior to the telemedicine visit and am voluntarily participating in the telemedicine visit.  I understand that I have the right to withhold or withdraw my consent to the use of telemedicine in the course of my care at any time, without affecting my right to future care or treatment, and that the Practitioner or I may terminate the telemedicine visit at any time. I understand that I have the right to inspect all information obtained and/or recorded in the course of the telemedicine visit and may receive copies of available information for a reasonable fee.  I understand that some of the potential risks of receiving the Services via telemedicine include:  Delay or interruption in medical evaluation due to technological equipment  failure or disruption; Information transmitted may not be sufficient (e.g. poor resolution of images) to allow for appropriate medical decision making by the Practitioner; and/or  In rare instances, security protocols could fail, causing a breach of personal health information.  Furthermore, I acknowledge that it is my responsibility to provide information about my medical history, conditions and care that is complete and accurate to the best of my ability. I acknowledge that Practitioner's advice, recommendations, and/or decision may be based on factors not within their control, such as incomplete or inaccurate data provided by me or distortions of diagnostic images or specimens that may result from electronic transmissions. I understand that the practice of medicine is not an exact science and that Practitioner makes no warranties or guarantees regarding treatment outcomes. I acknowledge that a copy of this consent can be made available to me via my patient portal [facility] MyChart), or I can request a printed copy by calling the office of [facility].    I understand that my insurance will be billed for this visit.   I have read or had this consent read to me. I understand the contents of this consent, which adequately explains the benefits and risks of the Services being provided via telemedicine.  I have been provided ample opportunity to ask questions regarding this consent and the Services and have had my questions answered to my satisfaction. I give my informed consent for the services to be provided through the use of telemedicine in my medical care

## 2023-06-19 NOTE — Telephone Encounter (Signed)
Patient called in today. States that he feels like the mass area is getting bigger and causing more pain. He wanted to know if there was any possibility of having surgery done sooner than 7/18. I informed him that you were [place] next week but, that I would send you a message. He is aware he won't receive a response until late part of next week.

## 2023-06-19 NOTE — Telephone Encounter (Signed)
I was able to reach pt on # [phone]. Pt has been scheduled for tele pre op appt 06/30/23 @ 10 am. Med rec and consent are done. I will update all parties involved.

## 2023-06-19 NOTE — Telephone Encounter (Signed)
Patient return Pre-op call. 

## 2023-06-19 NOTE — Telephone Encounter (Signed)
Our office has attempted x 3 to reach the pt to schedule a tele pre op appt. I will update the requesting office that we have not been able to reach the pt nor has the pt called back. Will remove from the pre op call back at this time until the pt calls back.

## 2023-06-28 NOTE — Telephone Encounter (Signed)
This is most certainly benign and unfortunately, there is no opportunity to move this case unless there is a cancellation.  We will let him know in that case.  [name], MD

## 2023-06-30 ENCOUNTER — Ambulatory Visit: Payer: PPO | Attending: Cardiovascular Disease | Admitting: Student

## 2023-06-30 ENCOUNTER — Telehealth: Payer: Self-pay

## 2023-06-30 DIAGNOSIS — Z01810 Encounter for preprocedural cardiovascular examination: Secondary | ICD-10-CM | POA: Diagnosis not present

## 2023-06-30 NOTE — Telephone Encounter (Signed)
Received clearance back to hold patients Eliquis prior to surgery for 2 days. Left detailed message on patient wife's phone [name]) instructing of this, advised to call back with any further questions or issues.

## 2023-06-30 NOTE — Progress Notes (Signed)
Virtual Visit via Telephone Note   Because of Stephen Stein's co-morbid illnesses, he is at least at moderate risk for complications without adequate follow up.  This format is felt to be most appropriate for this patient at this time.  The patient did not have access to video technology/had technical difficulties with video requiring transitioning to audio format only (telephone).  All issues noted in this document were discussed and addressed.  No physical exam could be performed with this format.  Please refer to the patient's chart for his consent to telehealth for [facility].  Evaluation Performed:  Preoperative cardiovascular risk assessment _____________   Date:  06/30/2023   Patient ID:  Stephen Stein, DOB 02/19/1941, MRN [id number] Patient Location:  Home Provider location:   Office  Primary Care Provider:  Danelle Berry, PA-C Primary Cardiologist:  Julien Nordmann, MD  Chief Complaint / Patient Profile   82 y.o. y/o male with a h/o CAD per CT cardiac scoring, PAF on anticoagulation, LBBB, hypertension, hyperlipidemia, Parkinson who is pending excision of scrotal mass by Dr. Apolinar Junes and presents today for telephonic preoperative cardiovascular risk assessment.  History of Present Illness    Stephen Stein is a 82 y.o. male who presents via audio/video conferencing for a telehealth visit today.  Pt was last seen in cardiology clinic on 12/13/2022 by Dr. Mariah Milling.  At that time Stephen Stein was doing well.  The patient is now pending procedure as outlined above. Since his last visit, he is doing well from a cardiac perspective. Patient denies shortness of breath or dyspnea on exertion. No chest pain, pressure, or tightness. Denies lower extremity edema on the right and chronic/stable edema left leg from previous break, orthopnea, or PND. No palpitations. He stays active on his property doing yard work, household activities, and maintenance.   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 08/2018 s/p lap chole.   BPH (benign prostatic hyperplasia)    Carotid atherosclerosis, bilateral    a. 01/2019 Carotid U/S: <50% bilat ICA stenosis.   Constipation 10/21/2021   Coronary artery calcification seen on CT scan    a. 11/2015 Cardiac CT: Cor Ca2+ = 892 (80th %'ile). Diffuse Ca2+ in all 3 major coronary arteries; b. 02/2016 MV: HTN response to exercise. Abnl ecg w/ horizontal inflat ST depression, but no ischemia/infarct on imaging; c. 09/2018 MV: EF 64%, no ischemia. Very sm region of mild distal antsept defect, likely 2/2 lbbb.   Depression    Grade I diastolic dysfunction    Hepatic steatosis    Hx of basal cell carcinoma 08/01/2019   R chest parasternal   Hyperlipidemia    Hypertension    Irregular heartbeat/Palpitations    a. Has been taking Atenolol for 20 years   LBBB (left bundle branch block)    Left ventricular hypokinesis    a. 09/2018 Echo: EF 45-50%, mod LVH, diff HK. Gr1 DD. Triv AI. Mildly dil Ao root. Mildly reduced RV fxn.   Lightheadedness    Mild cognitive impairment    NSVT (nonsustained ventricular tachycardia) (HCC)    a. 11/2018 Zio monitor - 13 beats of NSVT--asymptomatic.   Orthostatic hypotension    a. 11/2018 pressures improved w/ midodrine/florinef.   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 11/2018 Zio monitor: Avg HR 57 (min 28). Intermittent Mobitz I.   Wears dentures    Partial upper   Wears hearing aid in both ears    Past Surgical History:  Procedure Laterality Date   ANKLE FRACTURE SURGERY Left 09/28/2022   [facility]   CATARACT EXTRACTION W/PHACO Left 12/09/2020   Procedure: CATARACT EXTRACTION PHACO AND INTRAOCULAR LENS PLACEMENT (IOC) LEFT 6.94 01:09.7 9.9% ;  Surgeon: Lockie Mola, MD;  Location: [facility];  Service: Ophthalmology;  Laterality: Left;   CATARACT EXTRACTION W/PHACO Right 12/23/2020   Procedure: CATARACT EXTRACTION PHACO AND INTRAOCULAR LENS PLACEMENT (IOC) RIGHT MALYUGIN;  Surgeon: Lockie Mola, MD;  Location: [facility];  Service: Ophthalmology;  Laterality: Right;  8.44 1:14.6 11.3%   CHOLECYSTECTOMY N/A 09/13/2018   Procedure: LAPAROSCOPIC CHOLECYSTECTOMY;  Surgeon: Leafy Ro, MD;  Location: [facility] OR[facility];  Service: General;  Laterality: N/A;   COLONOSCOPY  12/26/2006   ESOPHAGOGASTRODUODENOSCOPY (EGD) WITH PROPOFOL N/A 08/20/2018   Procedure: ESOPHAGOGASTRODUODENOSCOPY (EGD) WITH PROPOFOL;  Surgeon: Midge Minium, MD;  Location: [facility] ENDOS[facility];  Service: Endoscopy;  Laterality: N/A;   EXCISION MASS HEAD Right 03/31/2023   Procedure: EXCISION LESION AURICLE;  Surgeon: Vernie Murders, MD;  Location: [facility];  Service: ENT;  Laterality: Right;   TONSILLECTOMY AND ADENOIDECTOMY  12/26/1953   TOTAL SHOULDER ARTHROPLASTY Left 09/11/2019   Procedure: TOTAL SHOULDER ARTHROPLASTY;  Surgeon: Lyndle Herrlich, MD;  Location: [facility] OR[facility];  Service: Orthopedics;  Laterality: Left;   TOTAL SHOULDER ARTHROPLASTY Right 08/02/2021   Procedure: TOTAL SHOULDER ARTHROPLASTY;  Surgeon: Lyndle Herrlich, MD;  Location: [facility] OR[facility];  Service: Orthopedics;  Laterality: Right;    Allergies  No Known Allergies  Home Medications    Prior to Admission medications   Medication Sig Start Date End Date Taking? Authorizing Provider  acetaminophen (TYLENOL) 500 MG tablet Take 1,000 mg by mouth every 6 (six) hours as needed for moderate pain.    [provider]  Azelastine HCl 137 MCG/SPRAY SOLN PLACE 2 SPRAYS INTO BOTH NOSTRILS 2 (TWO) TIMES DAILY 05/10/22   Danelle Berry, PA-C  carbidopa-levodopa (SINEMET IR) 25-100 MG tablet Take by mouth. Patient not taking: Reported on 06/19/2023 06/09/23   [provider]  Cholecalciferol (VITAMIN D3 PO) Take 1 tablet  by mouth daily.    [provider]  docusate sodium (COLACE) 100 MG capsule Take 1 capsule (100 mg total) by mouth 2 (two) times daily. Patient not taking: Reported on 06/19/2023 08/03/21   Altamese Cabal, PA-C  ELIQUIS 5 MG TABS tablet Take 5 mg by mouth 2 (two) times daily. 06/02/23   [provider]  meloxicam (MOBIC) 15 MG tablet Take 15 mg by mouth daily as needed. Patient not taking: Reported on 06/19/2023    [provider]  memantine (NAMENDA) 5 MG tablet Take 5 mg by mouth 2 (two) times daily. 08/15/22   [provider]  rOPINIRole (REQUIP) 1 MG tablet Take 1 mg by mouth 4 (four) times daily. 11/29/21   [provider]  rosuvastatin (CRESTOR) 10 MG tablet TAKE 1 TABLET BY MOUTH EVERY DAY 05/15/23   Danelle Berry, PA-C  sertraline (ZOLOFT) 100 MG tablet Take 100 mg by mouth daily. 09/11/20   [provider]  tamsulosin (FLOMAX) 0.4 MG CAPS capsule TAKE 1 CAPSULE BY MOUTH EVERYDAY AT BEDTIME 02/02/23   Danelle Berry, PA-C    Physical Exam    Vital Signs:  Stephen Stein does not have vital signs available for review today.  Given telephonic nature of communication, physical exam is limited. AAOx3. NAD. Normal affect.  Speech and respirations are unlabored.  Accessory Clinical Findings    None  Assessment & Plan    Primary Cardiologist: Julien Nordmann, MD  Preoperative cardiovascular risk assessment. Excision of scrotal mass by Dr. Apolinar Junes.   Chart reviewed as part of pre-operative protocol coverage. According to the RCRI, patient has a 0.4% risk of MACE. Patient reports activity equivalent to >4.0 METS (yard work, housework, maintenance around his property).   Given past medical history and time since last visit, based on ACC/AHA guidelines, Stephen Stein would be at acceptable risk for the planned procedure without further cardiovascular testing.   Patient was advised that if he develops new symptoms prior to surgery to contact our  office to arrange a follow-up appointment.  he verbalized understanding.  Per pharm D, patient can hold Eliquis 2 days prior to procedure.   I will route this recommendation to the requesting party via Epic fax function.  Please call with questions.  Time:   Today, I have spent 5 minutes with the patient with telehealth technology discussing medical history, symptoms, and management plan.     Carlos Levering, NP  06/30/2023, 8:21 AM

## 2023-07-05 ENCOUNTER — Encounter
Admission: RE | Admit: 2023-07-05 | Discharge: 2023-07-05 | Disposition: A | Discharge status: Home / Self Care | Payer: PPO | Source: Ambulatory Visit | Attending: Urology | Admitting: Urology

## 2023-07-05 VITALS — Ht 70.0 in | Wt 211.6 lb

## 2023-07-05 DIAGNOSIS — Z01810 Encounter for preprocedural cardiovascular examination: Secondary | ICD-10-CM

## 2023-07-05 DIAGNOSIS — I25118 Atherosclerotic heart disease of native coronary artery with other forms of angina pectoris: Secondary | ICD-10-CM

## 2023-07-05 DIAGNOSIS — I4891 Unspecified atrial fibrillation: Secondary | ICD-10-CM

## 2023-07-05 DIAGNOSIS — I447 Left bundle-branch block, unspecified: Secondary | ICD-10-CM

## 2023-07-05 DIAGNOSIS — I10 Essential (primary) hypertension: Secondary | ICD-10-CM

## 2023-07-05 HISTORY — DX: Unspecified osteoarthritis, unspecified site: M19.90

## 2023-07-05 HISTORY — DX: Other nonspecific abnormal finding of lung field: R91.8

## 2023-07-05 HISTORY — DX: Deficiency of other specified B group vitamins: E53.8

## 2023-07-05 HISTORY — DX: Prediabetes: R73.03

## 2023-07-05 HISTORY — DX: Anemia, unspecified: D64.9

## 2023-07-05 HISTORY — DX: Unspecified atrial fibrillation: I48.91

## 2023-07-05 HISTORY — DX: Atrioventricular block, second degree: I44.1

## 2023-07-05 HISTORY — DX: Restless legs syndrome: G25.81

## 2023-07-05 HISTORY — DX: Polyneuropathy, unspecified: G62.9

## 2023-07-05 NOTE — Patient Instructions (Signed)
Your procedure is scheduled on:07-13-23 Thursday Report to the Registration Desk on the 1st floor of the [facility].Then proceed to the 2nd floor Surgery Desk To find out your arrival time, please call [phone] between 1PM - 3PM on:07-12-23 Wednesday If your arrival time is 6:00 am, do not arrive before that time as the [facility] entrance doors do not open until 6:00 am.  REMEMBER: Instructions that are not followed completely may result in serious medical risk, up to and including death; or upon the discretion of your surgeon and anesthesiologist your surgery may need to be rescheduled.  Do not eat food OR drink any liquids after midnight the night before surgery.  No gum chewing or hard candies.  One week prior to surgery:Last dose today (07-05-23) Stop Anti-inflammatories (NSAIDS) such as meloxicam (MOBIC), Advil, Aleve, Ibuprofen, Motrin, Naproxen, Naprosyn and Aspirin based products such as Excedrin, Goody's Powder, BC Powder.You may however, continue to take Tylenol (Acetaminophen) if needed for pain up until the day of surgery. Stop ANY OVER THE COUNTER supplements/vitamins NOW (07-05-23) until after surgery(Vitamin D3)   Continue taking all prescribed medications with the exception of the following: -Stop your ELIQUIS 2 days prior to surgery-Last dose will be on 07-10-23 Monday  TAKE ONLY THESE MEDICATIONS THE MORNING OF SURGERY WITH A SIP OF WATER: -memantine (NAMENDA)  -rOPINIRole (REQUIP)  -sertraline (ZOLOFT)   No Alcohol for 24 hours before or after surgery.  No Smoking including e-cigarettes for 24 hours before surgery.  No chewable tobacco products for at least 6 hours before surgery.  No nicotine patches on the day of surgery.  Do not use any "recreational" drugs for at least a week (preferably 2 weeks) before your surgery.  Please be advised that the combination of cocaine and anesthesia may have negative outcomes, up to and including death. If you test positive  for cocaine, your surgery will be cancelled.  On the morning of surgery brush your teeth with toothpaste and water, you may rinse your mouth with mouthwash if you wish. Do not swallow any toothpaste or mouthwash.  Do not wear jewelry, make-up, hairpins, clips or nail polish.  Do not wear lotions, powders, or perfumes.   Do not shave body hair from the neck down 48 hours before surgery.  Contact lenses, hearing aids and dentures may not be worn into surgery.  Do not bring valuables to the hospital. [facility] is not responsible for any missing/lost belongings or valuables.   Notify your doctor if there is any change in your medical condition (cold, fever, infection).  Wear comfortable clothing (specific to your surgery type) to the hospital.  After surgery, you can help prevent lung complications by doing breathing exercises.  Take deep breaths and cough every 1-2 hours. Your doctor may order a device called an Incentive Spirometer to help you take deep breaths. When coughing or sneezing, hold a pillow firmly against your incision with both hands. This is called "splinting." Doing this helps protect your incision. It also decreases belly discomfort.  If you are being admitted to the hospital overnight, leave your suitcase in the car. After surgery it may be brought to your room.  In case of increased patient census, it may be necessary for you, the patient, to continue your postoperative care in the Same Day Surgery department.  If you are being discharged the day of surgery, you will not be allowed to drive home. You will need a responsible individual to drive you home and stay  with you for 24 hours after surgery.   If you are taking public transportation, you will need to have a responsible individual with you.  Please call the Pre-admissions Testing Dept. at [phone] if you have any questions about these instructions.  Surgery Visitation Policy:  Patients having surgery  or a procedure may have two visitors.  Children under the age of 36 must have an adult with them who is not the patient.

## 2023-07-06 ENCOUNTER — Encounter
Admission: RE | Admit: 2023-07-06 | Discharge: 2023-07-06 | Disposition: A | Discharge status: Home / Self Care | Payer: PPO | Source: Ambulatory Visit | Attending: Urology | Admitting: Urology

## 2023-07-06 DIAGNOSIS — I447 Left bundle-branch block, unspecified: Secondary | ICD-10-CM | POA: Diagnosis not present

## 2023-07-06 DIAGNOSIS — I25118 Atherosclerotic heart disease of native coronary artery with other forms of angina pectoris: Secondary | ICD-10-CM | POA: Diagnosis not present

## 2023-07-06 DIAGNOSIS — Z01810 Encounter for preprocedural cardiovascular examination: Secondary | ICD-10-CM | POA: Insufficient documentation

## 2023-07-06 DIAGNOSIS — I4891 Unspecified atrial fibrillation: Secondary | ICD-10-CM | POA: Insufficient documentation

## 2023-07-06 DIAGNOSIS — I10 Essential (primary) hypertension: Secondary | ICD-10-CM | POA: Diagnosis not present

## 2023-07-10 ENCOUNTER — Ambulatory Visit: Payer: PPO | Admitting: Urology

## 2023-07-11 ENCOUNTER — Encounter: Payer: Self-pay | Admitting: Urology

## 2023-07-11 NOTE — Progress Notes (Signed)
Perioperative / Anesthesia Services  Pre-Admission Testing Clinical Review / Preoperative Anesthesia Consult  Date: 07/12/23  Patient Demographics:  Name: Stephen Stein DOB:   [birth year] MRN:   [id number]  Planned Surgical Procedure(s):    Case: [id number] Date/Time: 07/13/23 1148   Procedure: EXCISION OF SCROTUM MASS   Anesthesia type: General   Pre-op diagnosis: Scrotal Mass   Location: [facility] OR ROOM 05 / [facility] FOR [facility]IA [facility]   Surgeons: [name], MD     NOTE: Available PAT nursing documentation and vital signs have been reviewed. Clinical nursing staff has updated patient's PMH/PSHx, current medication list, and drug allergies/intolerances to ensure comprehensive history available to assist in medical decision making as it pertains to the aforementioned surgical procedure and anticipated anesthetic course. Extensive review of available clinical information personally performed. [place] PMH and PSHx updated with any diagnoses/procedures that  may have been inadvertently omitted during his intake with the pre-admission testing department's nursing staff.  Clinical Discussion:  Stephen Stein is a 82 y.o. male who is submitted for pre-surgical anesthesia review and clearance prior to him undergoing the above procedure. Patient is a Former Smoker (52.5 pack years; quit 05/1992). Pertinent PMH includes: CAD, atrial fibrillation, diastolic dysfunction, sinus bradycardia, second-degree AV block type I, NSVT, LBBB, BILATERAL carotid artery disease, aortic atherosclerosis, HTN, HLD, prediabetes, orthostatic hypotension, anemia, Parkinson's disease, scrotal mass, OA, RLS, depression, mixed vascular/Alzheimer's dementia.  Patient is followed by cardiology Mariah Milling, MD). He was last seen in the cardiology clinic on 12/13/2022; notes reviewed. At the time of his clinic visit, patient doing well overall from a cardiovascular perspective. Patient denied any chest pain, shortness of  breath, PND, orthopnea, palpitations, significant peripheral edema, weakness, fatigue, or presyncope/syncope. Patient with a past medical history significant for cardiovascular diagnoses. Documented physical exam was grossly benign, providing no evidence of acute exacerbation and/or decompensation of the patient's known cardiovascular conditions.  Coronary CTA was performed on 12/16/2015 revealing an elevated coronary calcium score of 892.  This placed patient in the 80th percentile for age/race/sex matched controls.  Diffuse calcium seen in all 3 major coronary arteries.  Ascending aorta normal at 3.2 cm.  Myocardial perfusion imaging study was performed on 10/17/2018 revealing a normal left ventricular systolic function with an EF of 64%.  There was a very small perfusion defect noted in the distal anteroseptal wall that improved on stress images.  Findings are likely secondary to conduction abnormality (bundle branch block).  There was no evidence of stress-induced arrhythmia or significant ECG changes.  Resting ECG did demonstrate a known LBBB.  Study determined to be low risk.  Long-term cardiac event monitor study performed on 12/10/2018 revealing a predominant underlying normal sinus rhythm at an average rate of 57 bpm.  There were 2 runs of NSVT noted with the fastest lasting 4 beats at a maximum rate of 164 bpm, and the longest lasting 13 beats at an average rate of 121 bpm.  There were no patient triggered events noted.  BILATERAL carotid Doppler study performed on 02/18/2019 revealing a <50% stenosis in the patient's BILATERAL internal carotid arteries.  Vertebral arteries patent with normal antegrade flow.  EXERCISE tolerance test was performed on 06/24/2021.  Following Bruce protocol, patient exercised for a duration of 4 minutes and 24 seconds.  Patient demonstrated a hypertensive response to exercise.  Peak heart rate 109 bpm (MPHR 141 bpm; achieved 77%).  Maximum estimated workload 4.7  METS.  Patient remained asymptomatic throughout the study with no reports  of angina.  Most recent TTE was performed on 07/15/2022 revealing a mildly reduced left ventricular systolic function with an EF of 40-45%.  There was mild LVH.  There were no regional wall motion abnormalities.  Left ventricular diastolic Doppler parameters indeterminant.  Right ventricular size and function normal.  Left atrium mildly dilated.  There was mild mitral valve regurgitation.  All transvalvular gradients were noted to be normal providing no evidence suggestive of valvular stenosis.  Aorta normal in size with no evidence of aneurysmal dilatation.  Patient with an atrial fibrillation diagnosis; CHA2DS2-VASc Score = 4 (age x 2, HTN, vascular disease history).Cardiac rate and rhythm currently being intrinsically maintained without pharmacological intervention.  Patient is on chronic daily oral anticoagulation therapy using apixaban.  Discussed cardioversion procedure, however patient declined.  He is reportedly compliant with therapy with no evidence or reports of GI bleeding.  Blood pressure well-controlled at 120/60 mmHg without any type of antihypertensive medications.  Patient is on rosuvastatin for his HLD diagnosis and ASCVD prevention.  He does have a prediabetes diagnosis.  At the time of his cardiology visit, his most recent A1c was 5.8% as checked on 10/21/2022.  Of note, since patient was last seen by cardiology, A1c has been rechecked with elevation to 6.1% on 04/21/2023.  Patient does not have an OSAH diagnosis. Patient is able to complete all of his  ADL/IADLs without cardiovascular limitation; requires assistance due to Parkinson's diagnosis with resulting gait instability, frequent falls, and vertiginous symptoms.  Per the DASI, patient is able to achieve at least 4 METS of physical activity without experiencing any significant degree of angina/anginal equivalent symptoms no changes were made to his medication  regimen.  Patient to follow-up with outpatient cardiology in 1 year or sooner if needed.  Stephen Stein is scheduled for an elective SCROTAL MASS EXCISION on 07/13/2023 with Dr. [name], MD, MD.  Given patient's past medical history significant for cardiovascular diagnoses, presurgical cardiac clearance was sought by the PAT team. Per cardiology, "according to the RCRI, patient has a 0.4% risk of MACE. Patient reports activity equivalent to >4.0 METS (yard work, housework, maintenance around his property). Given past medical history and time since last visit, based on ACC/AHA guidelines, Stephen Stein would be at [facility] risk for the planned procedure without further cardiovascular testing".  Again, this patient is on chronic anticoagulation therapy using a DOAC medication. He has been instructed on recommendations for holding his apixaban for 2 days prior to his procedure with plans to restart as soon as postoperative bleeding risk felt to be minimized by his attending surgeon. The patient has been instructed that his last dose of his apixaban should be on 07/10/2023.  Patient denies previous perioperative complications with anesthesia in the past. In review of the available records, it is noted that patient underwent a general anesthetic course at [facility] in 03/2023 without documented complications.      07/05/2023    9:00 AM 06/09/2023    2:42 PM 04/21/2023    9:29 AM  Vitals with BMI  Height 5\' 10"  5\' 10"  5\' 10"   Weight 211 lbs 10 oz 215 lbs 213 lbs 13 oz  BMI 30.37 30.85 30.68  Systolic  138 120  Diastolic  59 66  Pulse  38 55    Providers/Specialists:   NOTE: Primary physician provider listed below. Patient may have been seen by APP or partner within same practice.   PROVIDER ROLE / SPECIALTY LAST OV  [name], MD Urology (Surgeon) 06/12/2023  Danelle Berry, PA-C Primary Care Provider 04/21/2023  Julien Nordmann, MD Cardiology 12/13/2022; update  preop APP call on 06/30/2023  Cristopher Peru, MD Neurology 05/12/2023   Allergies:  Patient has no known allergies.  Current Home Medications:   No current facility-administered medications for this encounter.    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 08/2018 s/p lap chole.   BPH (benign prostatic hyperplasia)    Carotid atherosclerosis, bilateral    a. 01/2019 Carotid U/S: <50% bilat ICA stenosis.   Constipation 10/21/2021   Coronary artery calcification seen on CT scan    a.) cCTA 12/16/2015: Ca2+ 892 (80th percentile for age/sex/race matched control). Diffuse Ca2+ in all 3 major coronary arteries; b.) 02/2016 MV: HTN response to exercise. Abnl ecg w/ horizontal inflat ST depression, but no ischemia/infarct on imaging; c.) 09/2018 MV: EF 64%, no ischemia. Very sm region of mild distal antsept defect, likely 2/2 LBBB   Depression    Diastolic dysfunction 10/09/2018   a.) TTE 10/09/2018: EF 45-50%, diff HK, mod LVH, mild reduced RVSF, triv AR, G1DD; b.) TTE 07/15/2022: EF 45-50%. mild LVH, mild LA dil, mild MR/AR, Ao root 42 mm   Hepatic steatosis    History of bilateral cataract extraction 11/2020   History of methicillin  resistant staphylococcus aureus (MRSA) 12/01/2022   a.) RIGHT thigh wound   Hx of basal cell carcinoma 08/01/2019   R chest parasternal   Hyperlipidemia    Hypertension    Irregular heartbeat/Palpitations    a. Has been taking Atenolol for 20 years   LBBB (left bundle branch block)    Lightheadedness    Long term current use of anticoagulant    a.) apixaban   Mixed Alzheimer's and vascular dementia (HCC)    a.) on NMDA inhibitor (memantine)   Neuropathy    NSVT (nonsustained ventricular tachycardia) (HCC)    a. 11/2018 Zio monitor - 13 beats of NSVT--asymptomatic.   Orthostatic hypotension    a. 11/2018 pressures improved w/ midodrine/florinef.   Parkinson's disease    Pre-diabetes    Pulmonary nodules    RLS (restless legs syndrome)    a.) on ropinirole   Sebaceous cyst of scrotum    Second degree AV block, Mobitz type I    Sinus bradycardia    a. 11/2018 Zio monitor: Avg HR 57 (min 28). Intermittent Mobitz I.   Wears dentures    Partial upper   Wears hearing aid in both ears    Past Surgical History:  Procedure Laterality Date   ANKLE FRACTURE SURGERY Left 09/28/2022  [facility]   CATARACT EXTRACTION W/PHACO Left 12/09/2020   Procedure: CATARACT EXTRACTION PHACO AND INTRAOCULAR LENS PLACEMENT (IOC) LEFT 6.94 01:09.7 9.9% ;  Surgeon: Lockie Mola, MD;  Location: [facility];  Service: Ophthalmology;  Laterality: Left;   CATARACT EXTRACTION W/PHACO Right 12/23/2020   Procedure: CATARACT EXTRACTION PHACO AND INTRAOCULAR LENS PLACEMENT (IOC) RIGHT MALYUGIN;  Surgeon: Lockie Mola, MD;  Location: [facility];  Service: Ophthalmology;  Laterality: Right;  8.44 1:14.6 11.3%   CHOLECYSTECTOMY N/A 09/13/2018   Procedure: LAPAROSCOPIC CHOLECYSTECTOMY;  Surgeon: Leafy Ro, MD;  Location: [facility] ORS;  Service: General;  Laterality: N/A;   COLONOSCOPY  12/26/2006   ESOPHAGOGASTRODUODENOSCOPY (EGD) WITH PROPOFOL N/A 08/20/2018   Procedure:  ESOPHAGOGASTRODUODENOSCOPY (EGD) WITH PROPOFOL;  Surgeon: Midge Minium, MD;  Location: [facility];  Service: Endoscopy;  Laterality: N/A;   EXCISION MASS HEAD Right 03/31/2023   Procedure: EXCISION LESION AURICLE;  Surgeon: Vernie Murders, MD;  Location: [facility];  Service: ENT;  Laterality: Right;   TONSILLECTOMY AND ADENOIDECTOMY  12/26/1953   TOTAL SHOULDER ARTHROPLASTY Left 09/11/2019   Procedure: TOTAL SHOULDER ARTHROPLASTY;  Surgeon: Lyndle Herrlich, MD;  Location: [facility] ORS;  Service: Orthopedics;  Laterality: Left;   TOTAL SHOULDER ARTHROPLASTY Right 08/02/2021   Procedure: TOTAL SHOULDER ARTHROPLASTY;  Surgeon: Lyndle Herrlich, MD;  Location: [facility] ORS;  Service: Orthopedics;  Laterality: Right;   Family History  Problem Relation Age of Onset   Cancer Mother        lung   Heart disease Father    Hearing loss Sister        coclear implant   Hearing loss Brother    Cancer Paternal Grandmother    Cancer Son        colon cancer   Colon cancer Neg Hx    Stomach cancer Neg Hx    Social History   Tobacco Use   Smoking status: Former    Current packs/day: 0.00    Average packs/day: 1.5 packs/day for 35.0 years (52.5 ttl pk-yrs)    Types: Cigarettes    Start date: 06/22/1957    Quit date: 06/22/1992    Years since quitting: 31.0   Smokeless tobacco: Former    Quit date: 06/22/1992  Vaping Use   Vaping status: Never Used  Substance Use Topics   Alcohol use: Not Currently    Alcohol/week: 1.0 standard drink of alcohol    Types: 1 Cans of beer per week    Comment: occasional   Drug use: No    Pertinent Clinical Results:  LABS:  Lab Results  Component Value Date   WBC 7.3 04/21/2023   HGB 12.8 (L) 04/21/2023   HCT 38.2 (L) 04/21/2023   MCV 93.4 04/21/2023   PLT 228 04/21/2023   Lab Results  Component Value Date   NA 138 04/21/2023   K 4.7 04/21/2023   CO2 28 04/21/2023   GLUCOSE 97 04/21/2023   BUN 20 04/21/2023   CREATININE 1.06 04/21/2023   CALCIUM  9.6 04/21/2023   EGFR 71 04/21/2023   GFRNONAA >60 07/27/2021   Component Date Value Ref Range Status   Color, Urine 06/09/2023 YELLOW  YELLOW Final   APPearance 06/09/2023 CLEAR  CLEAR Final   Specific Gravity, Urine 06/09/2023 1.015  1.005 - 1.030 Final   pH 06/09/2023 6.5  5.0 - 8.0 Final   Glucose, UA 06/09/2023 NEGATIVE  NEGATIVE mg/dL Final   Hgb urine dipstick 06/09/2023 TRACE (A)  NEGATIVE Final   Bilirubin Urine  06/09/2023 NEGATIVE  NEGATIVE Final   Ketones, ur 06/09/2023 NEGATIVE  NEGATIVE mg/dL Final   Protein, ur 29/52/8413 NEGATIVE  NEGATIVE mg/dL Final   Nitrite 24/40/1027 NEGATIVE  NEGATIVE Final   Leukocytes,Ua 06/09/2023 NEGATIVE  NEGATIVE Final   Squamous Epithelial / HPF 06/09/2023 NONE SEEN  0 - 5 /HPF Final   WBC, UA 06/09/2023 NONE SEEN  0 - 5 WBC/hpf Final   RBC / HPF 06/09/2023 NONE SEEN  0 - 5 RBC/hpf Final   Bacteria, UA 06/09/2023 NONE SEEN  NONE SEEN Final   Performed at [facility], [address] [place], [place] [address]    ECG: Date: 07/06/2023 Time ECG obtained: 0935 AM Rate: 35 bpm Rhythm: atrial fibrillation with slow ventricular response; LBBB Intervals: QRS 166 ms. QTc 422 ms. ST segment and T wave changes: No evidence of acute ST segment elevation or depression Comparison: Similar to previous tracing obtained on 12/13/2022. Ventricular rate has decreased.    IMAGING / PROCEDURES: TRANSTHORACIC ECHOCARDIOGRAM performed on 07/15/2022 Left ventricular ejection fraction, by estimation, is 45 to 50%. The left ventricle has mildly decreased function. The left ventricle has no  regional wall motion abnormalities. There is mild left ventricular  hypertrophy. Left ventricular diastolic  parameters are indeterminate.  Right ventricular systolic function is normal. The right ventricular size is normal.  Left atrial size was mildly dilated.  The mitral valve is normal in structure. Mild mitral valve regurgitation. No evidence of mitral  stenosis.  The aortic valve is normal in structure. Aortic valve regurgitation is mild. No aortic stenosis is present.  The inferior vena cava is normal in size with greater than 50% respiratory variability, suggesting right atrial pressure of 3 mmHg.   EXERCISE TOLERANCE TEST performed on 06/24/2021 The patient exercised following the Bruce protocol for 4 minutes and 24 seconds Resting HR 66 bpm; resting BP 114/67 mmHg Stress HR 109 bpm; stress BP 190/66 mmHg 77% MPHR achieved (MPHR 141 bpm) Maximum work load: 4.7 METS The patient reported no symptoms during the stress test. The patient experienced no angina during the stress test.  The patient's response to exercise was adequate for diagnosis.   US CAROTID BILATERAL performed on 02/18/2019 Minor carotid intimal thickening and atherosclerosis.  No hemodynamically significant ICA stenosis. Degree of narrowing less than 50% bilaterally by ultrasound criteria. Patent antegrade vertebral flow bilaterally  MYOCARDIAL PERFUSION IMAGING STUDY (LEXISCAN) performed on 10/17/2018 Pharmacological myocardial perfusion imaging study with no significant ischemia Very small region of mild perfusion defect distal anteroseptal wall (improved on stress images compared to rest), likely secondary to conduction abnormality/bundle branch block Normal wall motion EF estimated at 64% No EKG changes concerning for ischemia at peak stress or in recovery. Resting EKG with left bundle branch block Low risk scan  CT CARDIAC SCORING performed on 12/16/2015 No significant non cardiac findings on limited lung and soft tissue windows.  Ascending Aorta:  3.2 cm Pericardium: Normal Coronary arteries: Diffuse calcium seen in all 3 major coronary arteries Coronary calcium score of 892. This was 80th percentile for age and sex matched controls Consider f/u stress testing or functional study  Impression and Plan:  Stephen Stein has been referred for pre-anesthesia  review and clearance prior to him undergoing the planned anesthetic and procedural courses. Available labs, pertinent testing, and imaging results were personally reviewed by me in preparation for upcoming operative/procedural course. [facility] medical record has been updated following extensive record review and patient interview with PAT staff.  This patient has been appropriately cleared by cardiology with an overall ACCEPTABLE risk of experiencing significant perioperative cardiovascular complications. Based on clinical review performed today (07/12/23), barring any significant acute changes in the patient's overall condition, it is anticipated that he will be able to proceed with the planned surgical intervention. Any acute changes in clinical condition may necessitate his procedure being postponed and/or cancelled. Patient will meet with anesthesia team (MD and/or CRNA) on the day of his procedure for preoperative evaluation/assessment. Questions regarding anesthetic course will be fielded at that time.   Pre-surgical instructions were reviewed with the patient during his PAT appointment, and questions were fielded to satisfaction by PAT clinical staff. He has been instructed on which medications that he will need to hold prior to surgery, as well as the ones that have been deemed safe/appropriate to take on the day of his procedure. As part of the general education provided by PAT, patient made aware both verbally and in writing, that he would need to abstain from the use of any illegal substances during his perioperative course.  He was advised that failure to follow the provided instructions could necessitate case cancellation or result in serious perioperative complications up to and including death. Patient encouraged to contact PAT and/or his surgeon's office to discuss any questions or concerns that may arise prior to surgery; verbalized understanding.   Quentin Mulling, MSN, APRN, FNP-C, CEN [facility]  Peri-operative Services Nurse Practitioner Phone: [phone] Fax: [phone] 07/12/23 10:32 AM  NOTE: This note has been prepared using Dragon dictation software. Despite my best ability to proofread, there is always the potential that unintentional transcriptional errors may still occur from this process.

## 2023-07-12 DIAGNOSIS — R5383 Other fatigue: Secondary | ICD-10-CM | POA: Diagnosis not present

## 2023-07-12 DIAGNOSIS — G629 Polyneuropathy, unspecified: Secondary | ICD-10-CM | POA: Diagnosis not present

## 2023-07-12 DIAGNOSIS — F0280 Dementia in other diseases classified elsewhere without behavioral disturbance: Secondary | ICD-10-CM | POA: Diagnosis not present

## 2023-07-12 DIAGNOSIS — R001 Bradycardia, unspecified: Secondary | ICD-10-CM | POA: Diagnosis not present

## 2023-07-12 DIAGNOSIS — F0150 Vascular dementia without behavioral disturbance: Secondary | ICD-10-CM | POA: Diagnosis not present

## 2023-07-12 DIAGNOSIS — G2581 Restless legs syndrome: Secondary | ICD-10-CM | POA: Diagnosis not present

## 2023-07-12 DIAGNOSIS — R0683 Snoring: Secondary | ICD-10-CM | POA: Diagnosis not present

## 2023-07-12 DIAGNOSIS — R4189 Other symptoms and signs involving cognitive functions and awareness: Secondary | ICD-10-CM | POA: Diagnosis not present

## 2023-07-12 DIAGNOSIS — E538 Deficiency of other specified B group vitamins: Secondary | ICD-10-CM | POA: Diagnosis not present

## 2023-07-12 DIAGNOSIS — G309 Alzheimer's disease, unspecified: Secondary | ICD-10-CM | POA: Diagnosis not present

## 2023-07-13 ENCOUNTER — Other Ambulatory Visit: Payer: Self-pay

## 2023-07-13 ENCOUNTER — Encounter
Admission: RE | Disposition: A | Discharge status: Home / Self Care | Payer: Self-pay | Source: Home / Self Care | Attending: Urology

## 2023-07-13 ENCOUNTER — Ambulatory Visit: Payer: PPO | Admitting: Urgent Care

## 2023-07-13 ENCOUNTER — Encounter: Payer: Self-pay | Admitting: Urology

## 2023-07-13 ENCOUNTER — Ambulatory Visit
Admission: RE | Admit: 2023-07-13 | Discharge: 2023-07-13 | Disposition: A | Discharge status: Home / Self Care | Payer: PPO | Attending: Urology | Admitting: Urology

## 2023-07-13 DIAGNOSIS — E785 Hyperlipidemia, unspecified: Secondary | ICD-10-CM | POA: Diagnosis not present

## 2023-07-13 DIAGNOSIS — R7303 Prediabetes: Secondary | ICD-10-CM | POA: Diagnosis not present

## 2023-07-13 DIAGNOSIS — L720 Epidermal cyst: Secondary | ICD-10-CM | POA: Diagnosis not present

## 2023-07-13 DIAGNOSIS — N3941 Urge incontinence: Secondary | ICD-10-CM | POA: Insufficient documentation

## 2023-07-13 DIAGNOSIS — Z87891 Personal history of nicotine dependence: Secondary | ICD-10-CM | POA: Diagnosis not present

## 2023-07-13 DIAGNOSIS — N509 Disorder of male genital organs, unspecified: Secondary | ICD-10-CM | POA: Diagnosis present

## 2023-07-13 DIAGNOSIS — R351 Nocturia: Secondary | ICD-10-CM | POA: Insufficient documentation

## 2023-07-13 DIAGNOSIS — N5089 Other specified disorders of the male genital organs: Secondary | ICD-10-CM

## 2023-07-13 DIAGNOSIS — L729 Follicular cyst of the skin and subcutaneous tissue, unspecified: Secondary | ICD-10-CM | POA: Diagnosis not present

## 2023-07-13 DIAGNOSIS — N401 Enlarged prostate with lower urinary tract symptoms: Secondary | ICD-10-CM | POA: Diagnosis not present

## 2023-07-13 HISTORY — DX: Sebaceous cyst: L72.3

## 2023-07-13 HISTORY — PX: SCROTAL EXPLORATION: SHX2386

## 2023-07-13 HISTORY — DX: Vascular dementia, unspecified severity, without behavioral disturbance, psychotic disturbance, mood disturbance, and anxiety: F01.50

## 2023-07-13 HISTORY — DX: Long term (current) use of anticoagulants: Z79.01

## 2023-07-13 HISTORY — DX: History of falling: Z91.81

## 2023-07-13 HISTORY — DX: Vascular dementia, unspecified severity, without behavioral disturbance, psychotic disturbance, mood disturbance, and anxiety: F02.80

## 2023-07-13 SURGERY — EXPLORATION, SCROTUM
Anesthesia: General | Site: Scrotum

## 2023-07-13 MED ORDER — CEFAZOLIN SODIUM-DEXTROSE 2-4 GM/100ML-% IV SOLN
INTRAVENOUS | Status: AC
  Filled 2023-07-13: qty 100

## 2023-07-13 MED ORDER — ACETAMINOPHEN 500 MG PO TABS: 1000.0000 mg | ORAL_TABLET | Freq: Once | ORAL | Status: DC

## 2023-07-13 MED ORDER — FAMOTIDINE 20 MG PO TABS
ORAL_TABLET | ORAL | Status: AC
  Filled 2023-07-13: qty 1

## 2023-07-13 MED ORDER — CHLORHEXIDINE GLUCONATE 0.12 % MT SOLN
OROMUCOSAL | Status: AC
  Filled 2023-07-13: qty 15

## 2023-07-13 MED ORDER — GLYCOPYRROLATE 0.2 MG/ML IJ SOLN
INTRAMUSCULAR | Status: DC | PRN
  Administered 2023-07-13: .2 mg via INTRAVENOUS

## 2023-07-13 MED ORDER — CEFAZOLIN SODIUM-DEXTROSE 2-4 GM/100ML-% IV SOLN
2.0000 g | INTRAVENOUS | Status: AC
  Administered 2023-07-13: 2 g via INTRAVENOUS

## 2023-07-13 MED ORDER — ORAL CARE MOUTH RINSE: 15.0000 mL | Freq: Once | OROMUCOSAL | Status: AC

## 2023-07-13 MED ORDER — BUPIVACAINE HCL 0.5 % IJ SOLN
INTRAMUSCULAR | Status: DC | PRN
  Administered 2023-07-13: 10 mL

## 2023-07-13 MED ORDER — 0.9 % SODIUM CHLORIDE (POUR BTL) OPTIME
TOPICAL | Status: DC | PRN
  Administered 2023-07-13: 1000 mL

## 2023-07-13 MED ORDER — ACETAMINOPHEN 10 MG/ML IV SOLN
INTRAVENOUS | Status: AC
  Filled 2023-07-13: qty 100

## 2023-07-13 MED ORDER — FENTANYL CITRATE (PF) 100 MCG/2ML IJ SOLN
INTRAMUSCULAR | Status: DC | PRN
  Administered 2023-07-13 (×2): 50 ug via INTRAVENOUS

## 2023-07-13 MED ORDER — BUPIVACAINE HCL (PF) 0.5 % IJ SOLN
INTRAMUSCULAR | Status: AC
  Filled 2023-07-13: qty 30

## 2023-07-13 MED ORDER — PROPOFOL 10 MG/ML IV BOLUS
INTRAVENOUS | Status: DC | PRN
  Administered 2023-07-13: 200 mg via INTRAVENOUS

## 2023-07-13 MED ORDER — CHLORHEXIDINE GLUCONATE 0.12 % MT SOLN
15.0000 mL | Freq: Once | OROMUCOSAL | Status: AC
  Administered 2023-07-13: 15 mL via OROMUCOSAL

## 2023-07-13 MED ORDER — EPHEDRINE SULFATE (PRESSORS) 50 MG/ML IJ SOLN
INTRAMUSCULAR | Status: DC | PRN
  Administered 2023-07-13: 5 mg via INTRAVENOUS

## 2023-07-13 MED ORDER — FENTANYL CITRATE (PF) 100 MCG/2ML IJ SOLN: 25.0000 ug | INTRAMUSCULAR | Status: DC | PRN

## 2023-07-13 MED ORDER — ONDANSETRON HCL 4 MG/2ML IJ SOLN
INTRAMUSCULAR | Status: DC | PRN
  Administered 2023-07-13: 4 mg via INTRAVENOUS

## 2023-07-13 MED ORDER — PROPOFOL 10 MG/ML IV BOLUS
INTRAVENOUS | Status: AC
  Filled 2023-07-13: qty 20

## 2023-07-13 MED ORDER — DEXAMETHASONE SODIUM PHOSPHATE 10 MG/ML IJ SOLN
INTRAMUSCULAR | Status: DC | PRN
  Administered 2023-07-13: 10 mg via INTRAVENOUS

## 2023-07-13 MED ORDER — ACETAMINOPHEN 10 MG/ML IV SOLN: 1000.0000 mg | Freq: Once | INTRAVENOUS | Status: DC | PRN

## 2023-07-13 MED ORDER — DROPERIDOL 2.5 MG/ML IJ SOLN: 0.6250 mg | Freq: Once | INTRAMUSCULAR | Status: DC | PRN

## 2023-07-13 MED ORDER — LACTATED RINGERS IV SOLN: INTRAVENOUS | Status: DC

## 2023-07-13 MED ORDER — PROMETHAZINE HCL 25 MG/ML IJ SOLN: 6.2500 mg | INTRAMUSCULAR | Status: DC | PRN

## 2023-07-13 MED ORDER — ACETAMINOPHEN 10 MG/ML IV SOLN
INTRAVENOUS | Status: DC | PRN
  Administered 2023-07-13: 1000 mg via INTRAVENOUS

## 2023-07-13 MED ORDER — FAMOTIDINE 20 MG PO TABS
20.0000 mg | ORAL_TABLET | Freq: Once | ORAL | Status: AC
  Administered 2023-07-13: 20 mg via ORAL

## 2023-07-13 MED ORDER — OXYCODONE HCL 5 MG PO TABS: 5.0000 mg | ORAL_TABLET | Freq: Once | ORAL | Status: DC | PRN

## 2023-07-13 MED ORDER — KETAMINE HCL 10 MG/ML IJ SOLN
INTRAMUSCULAR | Status: DC | PRN
  Administered 2023-07-13 (×2): 10 mg via INTRAVENOUS

## 2023-07-13 MED ORDER — FENTANYL CITRATE (PF) 100 MCG/2ML IJ SOLN
INTRAMUSCULAR | Status: AC
  Filled 2023-07-13: qty 2

## 2023-07-13 MED ORDER — KETAMINE HCL 50 MG/5ML IJ SOSY
PREFILLED_SYRINGE | INTRAMUSCULAR | Status: AC
  Filled 2023-07-13: qty 5

## 2023-07-13 MED ORDER — LIDOCAINE HCL (CARDIAC) PF 100 MG/5ML IV SOSY
PREFILLED_SYRINGE | INTRAVENOUS | Status: DC | PRN
  Administered 2023-07-13: 100 mg via INTRAVENOUS

## 2023-07-13 MED ORDER — OXYCODONE HCL 5 MG/5ML PO SOLN: 5.0000 mg | Freq: Once | ORAL | Status: DC | PRN

## 2023-07-13 SURGICAL SUPPLY — 39 items
ADH SKN CLS APL DERMABOND .7 (GAUZE/BANDAGES/DRESSINGS) ×1
APL PRP STRL LF DISP 70% ISPRP (MISCELLANEOUS) ×1
BLADE CLIPPER SURG (BLADE) ×2 IMPLANT
BLADE SURG 15 STRL LF DISP TIS (BLADE) ×2 IMPLANT
BLADE SURG 15 STRL SS (BLADE) ×1
CHLORAPREP W/TINT 26 (MISCELLANEOUS) ×2 IMPLANT
DERMABOND ADVANCED .7 DNX12 (GAUZE/BANDAGES/DRESSINGS) ×2 IMPLANT
DRAIN PENROSE 12X.25 LTX STRL (MISCELLANEOUS) IMPLANT
DRAPE LAPAROTOMY 77X122 PED (DRAPES) ×2 IMPLANT
DRSG GAUZE FLUFF 36X18 (GAUZE/BANDAGES/DRESSINGS) ×2 IMPLANT
ELECT REM PT RETURN 9FT ADLT (ELECTROSURGICAL) ×1
ELECTRODE REM PT RTRN 9FT ADLT (ELECTROSURGICAL) ×2 IMPLANT
GAUZE 4X4 16PLY ~~LOC~~+RFID DBL (SPONGE) ×2 IMPLANT
GAUZE PACKING IODOFORM 1/2INX (GAUZE/BANDAGES/DRESSINGS) IMPLANT
GAUZE SPONGE 4X4 12PLY STRL (GAUZE/BANDAGES/DRESSINGS) IMPLANT
GLOVE BIO SURGEON STRL SZ 6.5 (GLOVE) ×2 IMPLANT
GOWN STRL REUS W/ TWL LRG LVL3 (GOWN DISPOSABLE) ×4 IMPLANT
GOWN STRL REUS W/TWL LRG LVL3 (GOWN DISPOSABLE) ×2
KIT TURNOVER KIT A (KITS) ×2 IMPLANT
LABEL OR SOLS (LABEL) ×2 IMPLANT
MANIFOLD NEPTUNE II (INSTRUMENTS) ×2 IMPLANT
NDL HYPO 25X1 1.5 SAFETY (NEEDLE) ×2 IMPLANT
NEEDLE HYPO 25X1 1.5 SAFETY (NEEDLE) ×1 IMPLANT
NS IRRIG 500ML POUR BTL (IV SOLUTION) ×2 IMPLANT
PACK BASIN MINOR ARMC (MISCELLANEOUS) ×2 IMPLANT
SUPPORETR ATHLETIC LG (MISCELLANEOUS) ×2 IMPLANT
SUPPORTER ATHLETIC LG (MISCELLANEOUS) ×1
SUT CHROMIC 3 0 PS 2 (SUTURE) ×4 IMPLANT
SUT CHROMIC 3 0 SH 27 (SUTURE) IMPLANT
SUT ETHILON 3-0 FS-10 30 BLK (SUTURE)
SUT ETHILON NAB PS2 4-0 18IN (SUTURE) IMPLANT
SUT VIC AB 3-0 SH 27 (SUTURE) ×1
SUT VIC AB 3-0 SH 27X BRD (SUTURE) ×2 IMPLANT
SUT VIC AB 4-0 SH 27 (SUTURE)
SUT VIC AB 4-0 SH 27XANBCTRL (SUTURE) IMPLANT
SUTURE EHLN 3-0 FS-10 30 BLK (SUTURE) IMPLANT
SYR 10ML LL (SYRINGE) ×2 IMPLANT
TRAP FLUID SMOKE EVACUATOR (MISCELLANEOUS) ×2 IMPLANT
WATER STERILE IRR 500ML POUR (IV SOLUTION) ×2 IMPLANT

## 2023-07-13 NOTE — Anesthesia Procedure Notes (Signed)
Procedure Name: LMA Insertion Date/Time: 07/13/2023 1:13 PM  Performed by: Cheral Bay, CRNAPre-anesthesia Checklist: Patient identified, Emergency Drugs available, Suction available and Patient being monitored Patient Re-evaluated:Patient Re-evaluated prior to induction Oxygen Delivery Method: Circle system utilized Preoxygenation: Pre-oxygenation with 100% oxygen Induction Type: IV induction Ventilation: Mask ventilation without difficulty LMA: LMA inserted LMA Size: 4.0 Tube type: Oral Number of attempts: 1 Placement Confirmation: positive ETCO2 and breath sounds checked- equal and bilateral Tube secured with: Tape Dental Injury: Teeth and Oropharynx as per pre-operative assessment

## 2023-07-13 NOTE — Transfer of Care (Signed)
Immediate Anesthesia Transfer of Care Note  Patient: Stephen Stein  Procedure(s) Performed: EXCISION OF SCROTUM MASS (Scrotum)  Patient Location: PACU  Anesthesia Type:General  Level of Consciousness: awake and drowsy  Airway & Oxygen Therapy: Patient Spontanous Breathing and Patient connected to nasal cannula oxygen  Post-op Assessment: Report given to RN and Post -op Vital signs reviewed and stable  Post vital signs: Reviewed and stable  Last Vitals:  Vitals Value Taken Time  BP 147/79 07/13/23 1412  Temp    Pulse 59 07/13/23 1415  Resp 19 07/13/23 1415  SpO2 94 % 07/13/23 1415  Vitals shown include unfiled device data.  Last Pain:  Vitals:   07/13/23 1022  TempSrc: Temporal  PainSc: 0-No pain         Complications: No notable events documented.

## 2023-07-13 NOTE — Discharge Instructions (Addendum)
He may resume Eliquis tomorrow as long as you are not having active bleeding from the incision.  You may shower as normal.  If your scrotum starts to swell, wear tight underwear.  Your sutures will fall out on their own.   AMBULATORY SURGERY  DISCHARGE INSTRUCTIONS   The drugs that you were given will stay in your system until tomorrow so for the next 24 hours you should not:  Drive an automobile Make any legal decisions Drink any alcoholic beverage   You may resume regular meals tomorrow.  Today it is better to start with liquids and gradually work up to solid foods.  You may eat anything you prefer, but it is better to start with liquids, then soup and crackers, and gradually work up to solid foods.   Please notify your doctor immediately if you have any unusual bleeding, trouble breathing, redness and pain at the surgery site, drainage, fever, or pain not relieved by medication.    Additional Instructions:   Please contact your physician with any problems or Same Day Surgery at [phone], Monday through Friday 6 am to 4 pm, or [place] at [facility] number at [phone].

## 2023-07-13 NOTE — Anesthesia Preprocedure Evaluation (Addendum)
Anesthesia Evaluation  Patient identified by MRN, date of birth, ID band Patient awake    Reviewed: Allergy & Precautions, H&P , NPO status , Patient's Chart, lab work & pertinent test results  Airway Mallampati: II  TM Distance: >3 FB Neck ROM: full    Dental  (+) Partial Upper, Missing, Poor Dentition   Pulmonary former smoker   Pulmonary exam normal        Cardiovascular hypertension, + CAD (cCTA 12/16/2015: Ca2+ 892 (80th percentile for age/sex/race matched control). Diffuse Ca2+ in all 3 major coronary arteries) and + Peripheral Vascular Disease (01/2019 Carotid U/S: <50% bilat ICA stenosis.)  + dysrhythmias Atrial Fibrillation  Rhythm:Irregular Rate:Bradycardia - Peripheral Edema TTE 07/15/2022: EF               45-50%. mild LVH, mild LA dil, mild MR/AR, Ao root 42 mm  Orthostatic hypotension  EKG reviewed   Neuro/Psych  PSYCHIATRIC DISORDERS  Depression   Dementia Mixed Alzheimer's and vascular dementia Neuropathy  Parkinson's disease    GI/Hepatic negative GI ROS, Neg liver ROS,,,  Endo/Other  negative endocrine ROS    Renal/GU      Musculoskeletal   Abdominal  (+) + obese  Peds  Hematology  (+) Blood dyscrasia, anemia   Anesthesia Other Findings Scrotal Mass  Past Medical History: 05/09/2017: Adrenal adenoma     Comment:  2 cm on scan May 2018; refer to endo No date: Anemia No date: Aortic atherosclerosis (HCC) No date: Arthritis No date: At high risk for falls No date: Atrial fibrillation (HCC)     Comment:  a.) CHA2DS2VASc = 4 (age x 2, HTN, vascular disease               history);  b.) rate/rhythm maintained without               pharmacological intervention; chronically anticoagulated               with apixaban No date: B12 deficiency No date: Biliary dyskinesia     Comment:  a. 08/2018 s/p lap chole. No date: BPH (benign prostatic hyperplasia) No date: Carotid atherosclerosis, bilateral      Comment:  a. 01/2019 Carotid U/S: <50% bilat ICA stenosis. 10/21/2021: Constipation No date: Coronary artery calcification seen on CT scan     Comment:  a.) cCTA 12/16/2015: Ca2+ 892 (80th percentile for               age/sex/race matched control). Diffuse Ca2+ in all 3               major coronary arteries; b.) 02/2016 MV: HTN response to               exercise. Abnl ecg w/ horizontal inflat ST depression,               but no ischemia/infarct on imaging; c.) 09/2018 MV: EF               64%, no ischemia. Very sm region of mild distal antsept               defect, likely 2/2 LBBB No date: Depression 10/09/2018: Diastolic dysfunction     Comment:  a.) TTE 10/09/2018: EF 45-50%, diff HK, mod LVH, mild               reduced RVSF, triv AR, G1DD; b.) TTE 07/15/2022: EF               45-50%.  mild LVH, mild LA dil, mild MR/AR, Ao root 42 mm No date: Hepatic steatosis 11/2020: History of bilateral cataract extraction 12/01/2022: History of methicillin resistant staphylococcus aureus  (MRSA)     Comment:  a.) RIGHT thigh wound 08/01/2019: Hx of basal cell carcinoma     Comment:  R chest parasternal No date: Hyperlipidemia No date: Hypertension No date: Irregular heartbeat/Palpitations     Comment:  a. Has been taking Atenolol for 20 years No date: LBBB (left bundle branch block) No date: Lightheadedness No date: Long term current use of anticoagulant     Comment:  a.) apixaban No date: Mixed Alzheimer's and vascular dementia (HCC)     Comment:  a.) on NMDA inhibitor (memantine) No date: Neuropathy No date: NSVT (nonsustained ventricular tachycardia) (HCC)     Comment:  a. 11/2018 Zio monitor - 13 beats of NSVT--asymptomatic. No date: Orthostatic hypotension     Comment:  a. 11/2018 pressures improved w/ midodrine/florinef. No date: Parkinson's disease No date: Pre-diabetes No date: Pulmonary nodules No date: RLS (restless legs syndrome)     Comment:  a.) on ropinirole No date:  Sebaceous cyst of scrotum No date: Second degree AV block, Mobitz type I No date: Sinus bradycardia     Comment:  a. 11/2018 Zio monitor: Avg HR 57 (min 28). Intermittent              Mobitz I. No date: Wears dentures     Comment:  Partial upper No date: Wears hearing aid in both ears  Past Surgical History: 09/28/2022: ANKLE FRACTURE SURGERY; Left     Comment:  [facility] 12/09/2020: CATARACT EXTRACTION W/PHACO; Left     Comment:  Procedure: CATARACT EXTRACTION PHACO AND INTRAOCULAR               LENS PLACEMENT (IOC) LEFT 6.94 01:09.7 9.9% ;  Surgeon:               Lockie Mola, MD;  Location: [facility];              Service: Ophthalmology;  Laterality: Left; 12/23/2020: CATARACT EXTRACTION W/PHACO; Right     Comment:  Procedure: CATARACT EXTRACTION PHACO AND INTRAOCULAR               LENS PLACEMENT (IOC) RIGHT MALYUGIN;  Surgeon:               Lockie Mola, MD;  Location: [facility];              Service: Ophthalmology;  Laterality: Right;                8.44 1:14.6 11.3% 09/13/2018: CHOLECYSTECTOMY; N/A     Comment:  Procedure: LAPAROSCOPIC CHOLECYSTECTOMY;  Surgeon:               Leafy Ro, MD;  Location: [facility];  Service:               General;  Laterality: N/A; 12/26/2006: COLONOSCOPY 08/20/2018: ESOPHAGOGASTRODUODENOSCOPY (EGD) WITH PROPOFOL; N/A     Comment:  Procedure: ESOPHAGOGASTRODUODENOSCOPY (EGD) WITH               PROPOFOL;  Surgeon: Midge Minium, MD;  Location: [facility]               ENDOSCOPY;  Service: Endoscopy;  Laterality: N/A; 03/31/2023: EXCISION MASS HEAD; Right     Comment:  Procedure: EXCISION LESION AURICLE;  Surgeon: Elenore Rota,  Renae Fickle, MD;  Location: [facility];  Service: ENT;               Laterality: Right; 12/26/1952: TONSILLECTOMY AND ADENOIDECTOMY 09/11/2019: TOTAL SHOULDER ARTHROPLASTY; Left     Comment:  Procedure: TOTAL SHOULDER ARTHROPLASTY;  Surgeon:               Lyndle Herrlich, MD;   Location: [facility];  Service:               Orthopedics;  Laterality: Left; 08/02/2021: TOTAL SHOULDER ARTHROPLASTY; Right     Comment:  Procedure: TOTAL SHOULDER ARTHROPLASTY;  Surgeon:               Lyndle Herrlich, MD;  Location: [facility];  Service:               Orthopedics;  Laterality: Right;     Reproductive/Obstetrics negative OB ROS                              Anesthesia Physical Anesthesia Plan  ASA: 3  Anesthesia Plan: General LMA   Post-op Pain Management: Regional block*, Ofirmev IV (intra-op)* and Toradol IV (intra-op)*   Induction: Intravenous  PONV Risk Score and Plan: 2 and Dexamethasone, Ondansetron, Midazolam and Treatment may vary due to age or medical condition  Airway Management Planned: LMA  Additional Equipment:   Intra-op Plan:   Post-operative Plan: Extubation in OR  Informed Consent: I have reviewed the patients History and Physical, chart, labs and discussed the procedure including the risks, benefits and alternatives for the proposed anesthesia with the patient or authorized representative who has indicated his/her understanding and acceptance.     Dental Advisory Given  Plan Discussed with: Anesthesiologist, CRNA and Surgeon  Anesthesia Plan Comments:          Anesthesia Quick Evaluation

## 2023-07-13 NOTE — Anesthesia Postprocedure Evaluation (Signed)
Anesthesia Post Note  Patient: Stephen Stein  Procedure(s) Performed: EXCISION OF SCROTUM MASS (Scrotum)  Patient location during evaluation: PACU Anesthesia Type: General Level of consciousness: awake and alert Pain management: pain level controlled Vital Signs Assessment: post-procedure vital signs reviewed and stable Respiratory status: spontaneous breathing, nonlabored ventilation and respiratory function stable Cardiovascular status: blood pressure returned to baseline and stable Postop Assessment: no apparent nausea or vomiting Anesthetic complications: no   No notable events documented.   Last Vitals:  Vitals:   07/13/23 1445 07/13/23 1506  BP: 124/69 138/61  Pulse: (!) 50 (!) 57  Resp: 12 16  Temp:  (!) 36.1 C  SpO2: 100% 100%    Last Pain:  Vitals:   07/13/23 1506  TempSrc: Temporal  PainSc: 0-No pain                 Foye Deer

## 2023-07-13 NOTE — Progress Notes (Signed)
Per PACU RN patient heart rate would drop into 30s and 40s. Per PACU RN, anesthesiologist Dr. Laural Benes aware of heart rate. This RN informed patient spouse, Taysean Wager, to follow up with patient cardiologist. Spouse verbalized understanding. Patient d/c with heart rate at 57 bpm. Patient denies any complaints at this time. Patient alert and oriented x 4. Respirations even and unlabored.

## 2023-07-13 NOTE — H&P (Signed)
07/13/23  RRR CTAB   HPI: 82 year-old male who is referred for further evaluation of a testicular mass.    He does not have any imaging at this time. He reports that the mass started about the size of a BB approximately 2 years ago and has significantly increased in size over the past one to two weeks, causing discomfort. He denies any associated symptoms such as fever or chills.    Additionally, he reports occasional urge incontinence and nocturia x2-3. He describes his urinary stream as sometimes weak. He is currently taking Flomax for his prostate issues. He is pre-diabetic and has a history of prostate problems for many years.     PMH:     Past Medical History:  Diagnosis Date   Adrenal adenoma 05/09/2017    2 cm on scan May 2018; refer to endo   Aortic atherosclerosis (HCC)     Biliary dyskinesia      a. 08/2018 s/p lap chole.   BPH (benign prostatic hyperplasia)     Carotid atherosclerosis, bilateral      a. 01/2019 Carotid U/S: <50% bilat ICA stenosis.   Constipation 10/21/2021   Coronary artery calcification seen on CT scan      a. 11/2015 Cardiac CT: Cor Ca2+ = 892 (80th %'ile). Diffuse Ca2+ in all 3 major coronary arteries; b. 02/2016 MV: HTN response to exercise. Abnl ecg w/ horizontal inflat ST depression, but no ischemia/infarct on imaging; c. 09/2018 MV: EF 64%, no ischemia. Very sm region of mild distal antsept defect, likely 2/2 lbbb.   Depression     Grade I diastolic dysfunction     Hepatic steatosis     Hx of basal cell carcinoma 08/01/2019    R chest parasternal   Hyperlipidemia     Hypertension     Irregular heartbeat/Palpitations      a. Has been taking Atenolol for 20 years   LBBB (left bundle branch block)     Left ventricular hypokinesis      a. 09/2018 Echo: EF 45-50%, mod LVH, diff HK. Gr1 DD. Triv AI. Mildly dil Ao root. Mildly reduced RV fxn.   Lightheadedness     Mild cognitive impairment     NSVT (nonsustained ventricular tachycardia) (HCC)       a. 11/2018 Zio monitor - 13 beats of NSVT--asymptomatic.   Orthostatic hypotension      a. 11/2018 pressures improved w/ midodrine/florinef.   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 11/2018 Zio monitor: Avg HR 57 (min 28). Intermittent Mobitz I.   Wears dentures      Partial upper   Wears hearing aid in both ears            Surgical History:      Past Surgical History:  Procedure Laterality Date   ANKLE FRACTURE SURGERY Left 09/28/2022    [facility]   CATARACT EXTRACTION W/PHACO Left 12/09/2020    Procedure: CATARACT EXTRACTION PHACO AND INTRAOCULAR LENS PLACEMENT (IOC) LEFT 6.94 01:09.7 9.9% ;  Surgeon: Lockie Mola, MD;  Location: [facility];  Service: Ophthalmology;  Laterality: Left;   CATARACT EXTRACTION W/PHACO Right 12/23/2020    Procedure: CATARACT EXTRACTION PHACO AND INTRAOCULAR LENS PLACEMENT (IOC) RIGHT MALYUGIN;  Surgeon: Lockie Mola, MD;  Location: [facility];  Service: Ophthalmology;  Laterality: Right;  8.44 1:14.6 11.3%   CHOLECYSTECTOMY N/A 09/13/2018    Procedure: LAPAROSCOPIC CHOLECYSTECTOMY;  Surgeon: Leafy Ro, MD;  Location: [facility];  Service: General;  Laterality: N/A;   COLONOSCOPY   12/26/2006   ESOPHAGOGASTRODUODENOSCOPY (EGD) WITH PROPOFOL N/A 08/20/2018    Procedure: ESOPHAGOGASTRODUODENOSCOPY (EGD) WITH PROPOFOL;  Surgeon: Midge Minium, MD;  Location: [facility];  Service: Endoscopy;  Laterality: N/A;   EXCISION MASS HEAD Right 03/31/2023    Procedure: EXCISION LESION AURICLE;  Surgeon: Vernie Murders, MD;  Location: [facility];  Service: ENT;  Laterality: Right;   TONSILLECTOMY AND ADENOIDECTOMY   12/26/1953   TOTAL SHOULDER ARTHROPLASTY Left 09/11/2019    Procedure: TOTAL SHOULDER ARTHROPLASTY;  Surgeon: Lyndle Herrlich, MD;  Location: [facility];  Service: Orthopedics;  Laterality: Left;   TOTAL SHOULDER ARTHROPLASTY Right 08/02/2021     Procedure: TOTAL SHOULDER ARTHROPLASTY;  Surgeon: Lyndle Herrlich, MD;  Location: [facility];  Service: Orthopedics;  Laterality: Right;          Home Medications:  Allergies as of 06/09/2023   No Known Allergies         Medication List           Accurate as of June 09, 2023  3:29 PM. If you have any questions, ask your nurse or doctor.              STOP taking these medications     Clobetasol Prop Emollient Base 0.05 % emollient cream Commonly known as: Clobetasol Propionate E Stopped by: [name], MD    mupirocin ointment 2 % Commonly known as: BACTROBAN Stopped by: [name], MD           TAKE these medications     acetaminophen 500 MG tablet Commonly known as: TYLENOL Take 1,000 mg by mouth every 6 (six) hours as needed for moderate pain.    Azelastine HCl 137 MCG/SPRAY Soln PLACE 2 SPRAYS INTO BOTH NOSTRILS 2 (TWO) TIMES DAILY    carbidopa-levodopa 25-100 MG tablet Commonly known as: SINEMET IR Take by mouth. What changed: Another medication with the same name was removed. Continue taking this medication, and follow the directions you see here. Changed by: [name], MD    docusate sodium 100 MG capsule Commonly known as: COLACE Take 1 capsule (100 mg total) by mouth 2 (two) times daily.    Eliquis 5 MG Tabs tablet Generic drug: apixaban Take 5 mg by mouth 2 (two) times daily.    meloxicam 15 MG tablet Commonly known as: MOBIC Take 15 mg by mouth daily as needed.    memantine 5 MG tablet Commonly known as: NAMENDA Take 5 mg by mouth 2 (two) times daily.    rOPINIRole 1 MG tablet Commonly known as: REQUIP Take 1 mg by mouth 4 (four) times daily.    rosuvastatin 10 MG tablet Commonly known as: CRESTOR TAKE 1 TABLET BY MOUTH EVERY DAY    sertraline 100 MG tablet Commonly known as: ZOLOFT Take 100 mg by mouth daily.    tamsulosin 0.4 MG Caps capsule Commonly known as: FLOMAX TAKE 1 CAPSULE BY MOUTH EVERYDAY AT BEDTIME     VITAMIN D3 PO Take 1 tablet by mouth daily.           Family History:      Family History  Problem Relation Age of Onset   Cancer Mother          lung   Heart disease Father     Hearing loss Sister          coclear implant  Hearing loss Brother     Cancer Paternal Grandmother     Cancer Son          colon cancer   Colon cancer Neg Hx     Stomach cancer Neg Hx            Social History:  reports that he quit smoking about 30 years ago. His smoking use included cigarettes. He has a 52.50 pack-year smoking history. He quit smokeless tobacco use about 30 years ago. He reports that he does not currently use alcohol after a past usage of about 1.0 standard drink of alcohol per week. He reports that he does not use drugs.     Physical Exam: BP (!) 138/59 (BP Location: Left Arm, Patient Position: Sitting, Cuff Size: Large)   Pulse (!) 38   Ht 5\' 10"  (1.778 m)   Wt 215 lb (97.5 kg)   BMI 30.85 kg/m   Constitutional:  Alert and oriented, No acute distress. HEENT:  AT, moist mucus membranes.  Trachea midline, no masses. GU: Bilaterally descended testicles, normal size phallus but orthotopic meatus. On the left side of the scrotum, there is a large sebaceous cyst approximately 3 cm in size with whitish material just below the epithelial layer. Posterior to this, there is a more spongy, larger mass measuring approximately 8 cm, which appears to be connected to the cyst. No warmth, erythema, or drainage noted.  Neurologic: Grossly intact, no focal deficits, moving all 4 extremities. Psychiatric: Normal mood and affect.   Assessment & Plan:     1. Sebaceous cyst on scrotum - Does not appear to be any infections.  - It is bothersome to him and we will plan for surgical excision - We will need cardiac clearance to hold his Eliquis - He is agreeable to this plan   2. BPH - Progressive urinary symptoms, including occasional urge incontinence and nocturia x3, which is bothersome to  him - We discussed his options, including optimization of clinical therapy versus consideration of outlet procedure. - He is wanting to consider an outlet procedure and we will plan to pursue cystoscopy/TRUS. He will think about this and let us know.  - He is currently on Flomax and we should continue this medication.   Return for possible Cysto/TRUS.   I have reviewed the above documentation for accuracy and completeness, and I agree with the above.    [name], MD       [facility] [address], [address] [place], [place] [address] [phone]

## 2023-07-13 NOTE — Op Note (Addendum)
Date of procedure: 07/13/23  Preoperative diagnosis:  Scrotal mass  Postoperative diagnosis:  Same as above  Procedure: Excision of scrotal mass  Surgeon: [name], MD  Anesthesia: General  Complications: None  Intraoperative findings: When examined in preop holding area, there was an open area which was draining what appeared to be purulent fluid.  There is no surrounding erythema or fluctuance however.  Once opened, there is no clear abscess cavity, rather was solid and masslike and as such, the area was excised and then closed primarily.  Total excision approximately 3 cm x 2 cm.  EBL: Minimal  Specimens: Scrotal mass, scrotal culture  Drains: None  Indication: Stephen Stein is a 82 y.o. patient with enlarging left scrotal mass felt to be likely sebaceous cyst.  However in the preoperative holding area, the patient mentioned that the area had been enlarging and is now draining purulent material which was appreciated..  After reviewing the management options for treatment, he elected to proceed with the above surgical procedure(s). We have discussed the potential benefits and risks of the procedure, side effects of the proposed treatment, the likelihood of the patient achieving the goals of the procedure, and any potential problems that might occur during the procedure or recuperation. Informed consent has been obtained.  Description of procedure:  The patient was taken to the operating room and general anesthesia was induced.  The patient was placed in the supine position, prepped and draped in the usual sterile fashion, and preoperative antibiotics were administered. A preoperative time-out was performed.   The area in question was carefully examined.  There is a masslike rubbery area in the left hemiscrotum hemiscrotum adjacent to the inguinal fold and slightly dependent portion of the scrotum measuring about 2 and half centimeters by 1/2 cm.  There were 2 areas, 1 in the  superior pole which had a almost granulated rounded appearance and when expressed, a very small amount of thick material could be expressed but nothing additionally.  This is the same area in the preoperative holding area that was draining a small amount of thin or more purulent fluid.  There is area lower to this which also appeared to be a early or healed sinus tract.  Initially, this was handled as an I&D.  An 11 blade made longitudinal incision down the length of the mass from the tract to the midportion.  Upon opening the area however there is no drainage and it appeared masslike and solid in the middle.  This is more consistent with a lipoma or some sort of sebaceous type cyst rather than infection.  There is also no surrounding erythema or fluctuance and as such, was not felt to be infectious.  I did go ahead and swab the base and sent off for culture as a precaution.  I then created an ellipse type incision around the entirety of the mass and resected it in a piece wise fashion down to the dartos fascia.  This was passed off the field as scrotal mass.  The wound base measured at least 3 cm x 2 cm once completed.  I made sure that the wound edges were all free of any abnormal tissue there was good bleeding around the edges.  There are some mild bleeding at the deep base for which a 3-0 Vicryl suture was used for figure-of-eight fashion x 2 for hemostasis along with Bovie electrocautery.  I then irrigated the wound.  Deep dermals were then placed to bring the wound edges  back primarily.  This was tension-free.  The skin was closed using interrupted 3-0 chromic sutures.  Hemostasis was excellent.  Additional Dermabond was applied along with scrotal fluffs and a scrotal support device.  I did instill the wound edges with half percent Marcaine during the procedure for local anesthetic.  Patient was then cleaned and dried, repositioned in supine position, reversed of anesthesia and taken to PACU in stable  condition.  Plan: Will have her return in about a month for wound check.  At this point, there does not appear to be an infectious process and as such, no further antibiotics are needed.  [name], M.D.

## 2023-07-14 ENCOUNTER — Encounter: Payer: Self-pay | Admitting: Urology

## 2023-07-14 LAB — AEROBIC/ANAEROBIC CULTURE W GRAM STAIN (SURGICAL/DEEP WOUND): Culture: NO GROWTH

## 2023-07-17 ENCOUNTER — Telehealth: Payer: Self-pay | Admitting: *Deleted

## 2023-07-17 LAB — AEROBIC/ANAEROBIC CULTURE W GRAM STAIN (SURGICAL/DEEP WOUND)

## 2023-07-17 NOTE — Telephone Encounter (Signed)
Left a message for the patient to call back concerning the message below.      Appointment Request From: Stephen Stein   With Provider: Julien Nordmann [facility]]   Preferred Date Range: Any   Preferred Times: Any Time   Reason for visit: Office Visit   Comments: Surgery yesterday. Heart beat varied between 29 and 70. As info.

## 2023-07-20 ENCOUNTER — Telehealth: Payer: Self-pay | Admitting: Cardiovascular Disease

## 2023-07-20 LAB — AEROBIC/ANAEROBIC CULTURE W GRAM STAIN (SURGICAL/DEEP WOUND)

## 2023-07-20 NOTE — Telephone Encounter (Signed)
Called patient wife back. Just notified I had received message, advised them to let us know if they needed [place].   Left call back number if needed.  Thanks!

## 2023-07-20 NOTE — Telephone Encounter (Signed)
See another phone note.  Patient wife advised patient was doing well, advised with her to let us know if anything occurred.  Left call back number if needed.

## 2023-07-20 NOTE — Telephone Encounter (Signed)
Patient's wife is returning call. Patient's wife stated that the patient is doing good now.

## 2023-07-20 NOTE — Telephone Encounter (Signed)
Attempted to contact patient to discuss message below. LVM to call back, left call back number if still needed.

## 2023-07-21 ENCOUNTER — Other Ambulatory Visit: Payer: Self-pay

## 2023-07-21 MED ORDER — SULFAMETHOXAZOLE-TRIMETHOPRIM 800-160 MG PO TABS: 1.0000 | ORAL_TABLET | Freq: Two times a day (BID) | ORAL | 0 refills | Status: DC

## 2023-07-24 DIAGNOSIS — G4730 Sleep apnea, unspecified: Secondary | ICD-10-CM | POA: Diagnosis not present

## 2023-07-27 ENCOUNTER — Ambulatory Visit: Payer: PPO | Admitting: Physician Assistant

## 2023-07-27 DIAGNOSIS — E538 Deficiency of other specified B group vitamins: Secondary | ICD-10-CM | POA: Diagnosis not present

## 2023-08-02 ENCOUNTER — Other Ambulatory Visit: Payer: PPO | Admitting: Urology

## 2023-08-02 ENCOUNTER — Ambulatory Visit: Payer: PPO | Admitting: Urology

## 2023-08-02 VITALS — Ht 70.0 in | Wt 214.0 lb

## 2023-08-02 DIAGNOSIS — R3912 Poor urinary stream: Secondary | ICD-10-CM

## 2023-08-02 DIAGNOSIS — Z09 Encounter for follow-up examination after completed treatment for conditions other than malignant neoplasm: Secondary | ICD-10-CM | POA: Diagnosis not present

## 2023-08-02 DIAGNOSIS — N401 Enlarged prostate with lower urinary tract symptoms: Secondary | ICD-10-CM

## 2023-08-02 DIAGNOSIS — L723 Sebaceous cyst: Secondary | ICD-10-CM

## 2023-08-02 DIAGNOSIS — Z87438 Personal history of other diseases of male genital organs: Secondary | ICD-10-CM | POA: Diagnosis not present

## 2023-08-02 NOTE — Progress Notes (Signed)
I,Stephen Stein,acting as a scribe for Stephen Scotland, MD.,have documented all relevant documentation on the behalf of Stephen Scotland, MD,as directed by  Stephen Scotland, MD while in the presence of Stephen Scotland, MD.  08/02/2023 4:26 PM   Stephen Stein Stephen Stein Dec 09, 1941 [id number]  Referring provider: Danelle Berry, PA-C [address] [place],  [place] [address]  Chief Complaint  Patient presents with   Wound Check    Follow up    HPI: 82 year old male who underwent excision of a scrotal mass on 07/13/2023.   At intial consultation, it was felt to be a sebaceious cyst. The preoperative holding area, however, was draining spontaneuosly. Wound cultures were obtained and we went ahead and excised the area. It was more solid deeper in the wound bed, and it didn't appear to be actively infected.   When the wound cultures returned, he was called in a prescription for Bactrim as a precaution.  Surgical pathology was consistent with epidermal inclusion cyst.  He complains of urinary leakage today, which is bothersome enough that he desires treatment. He will be travelling tomorrow and will return on September 1st for 5 days before he travels again.   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 08/2018 s/p lap chole.   BPH (benign prostatic hyperplasia)    Carotid atherosclerosis, bilateral    a. 01/2019 Carotid U/S: <50% bilat ICA stenosis.   Constipation 10/21/2021   Coronary artery calcification seen on CT scan    a.) cCTA 12/16/2015: Ca2+ 892 (80th percentile for age/sex/race matched  control). Diffuse Ca2+ in all 3 major coronary arteries; b.) 02/2016 MV: HTN response to exercise. Abnl ecg w/ horizontal inflat ST depression, but no ischemia/infarct on imaging; c.) 09/2018 MV: EF 64%, no ischemia. Very sm region of mild distal antsept defect, likely 2/2 LBBB   Depression    Diastolic dysfunction 10/09/2018   a.) TTE 10/09/2018: EF 45-50%, diff HK, mod LVH, mild reduced RVSF, triv AR, G1DD; b.) TTE 07/15/2022: EF 45-50%. 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 Has been taking Atenolol for 20 years   LBBB (left bundle branch block)    Lightheadedness    Long term current use of anticoagulant    a.) apixaban   Mixed Alzheimer's and vascular dementia (HCC)    a.) on NMDA inhibitor (memantine)   Neuropathy    NSVT (nonsustained ventricular tachycardia) (HCC)    a. 11/2018 Zio monitor - 13 beats of NSVT--asymptomatic.   Orthostatic hypotension    a. 11/2018 pressures improved w/ midodrine/florinef.   Parkinson's disease    Pre-diabetes    Pulmonary nodules    RLS (restless legs syndrome)  a.) on ropinirole   Sebaceous cyst of scrotum    Second degree AV block, Mobitz type I    Sinus bradycardia    a. 11/2018 Zio monitor: Avg HR 57 (min 28). Intermittent Mobitz I.   Wears dentures    Partial upper   Wears hearing aid in both ears     Surgical History: Past Surgical History:  Procedure Laterality Date   ANKLE FRACTURE SURGERY Left 09/28/2022   [facility]   CATARACT EXTRACTION W/PHACO Left 12/09/2020   Procedure: CATARACT EXTRACTION PHACO AND INTRAOCULAR LENS PLACEMENT (IOC) LEFT 6.94 01:09.7 9.9% ;  Surgeon: Lockie Mola, MD;  Location: [facility];  Service: Ophthalmology;  Laterality: Left;   CATARACT EXTRACTION W/PHACO Right 12/23/2020   Procedure: CATARACT EXTRACTION PHACO AND INTRAOCULAR LENS PLACEMENT (IOC) RIGHT MALYUGIN;  Surgeon: Lockie Mola, MD;  Location: [facility];  Service: Ophthalmology;  Laterality: Right;  8.44 1:14.6 11.3%   CHOLECYSTECTOMY N/A 09/13/2018   Procedure: LAPAROSCOPIC CHOLECYSTECTOMY;  Surgeon: Leafy Ro, MD;  Location: [facility];  Service: General;  Laterality: N/A;   COLONOSCOPY  12/26/2006   ESOPHAGOGASTRODUODENOSCOPY (EGD) WITH PROPOFOL N/A 08/20/2018   Procedure: ESOPHAGOGASTRODUODENOSCOPY (EGD) WITH PROPOFOL;  Surgeon: Midge Minium, MD;  Location: [facility];  Service: Endoscopy;  Laterality: N/A;   EXCISION MASS HEAD Right 03/31/2023   Procedure: EXCISION LESION AURICLE;  Surgeon: Vernie Murders, MD;  Location: [facility];  Service: ENT;  Laterality: Right;   SCROTAL EXPLORATION N/A 07/13/2023   Procedure: EXCISION OF SCROTUM MASS;  Surgeon: Stephen Scotland, MD;  Location: [facility];  Service: Urology;  Laterality: N/A;   TONSILLECTOMY AND ADENOIDECTOMY  12/26/1953   TOTAL SHOULDER ARTHROPLASTY Left 09/11/2019   Procedure: TOTAL SHOULDER ARTHROPLASTY;  Surgeon: Lyndle Herrlich, MD;  Location: [facility];  Service: Orthopedics;  Laterality: Left;   TOTAL SHOULDER ARTHROPLASTY Right 08/02/2021   Procedure: TOTAL SHOULDER ARTHROPLASTY;  Surgeon: Lyndle Herrlich, MD;  Location: [facility];  Service: Orthopedics;  Laterality: Right;    Home Medications:  Allergies as of 08/02/2023   No Known Allergies      Medication List        Accurate as of August 02, 2023  4:26 PM. If you have any questions, ask your nurse or doctor.          STOP taking these medications    sulfamethoxazole-trimethoprim 800-160 MG tablet Commonly known as: BACTRIM DS       TAKE these medications    acetaminophen 500 MG tablet Commonly known as: TYLENOL Take 1,000 mg by  mouth every 6 (six) hours as needed for moderate pain.   docusate sodium 100 MG capsule Commonly known as: COLACE Take 1 capsule (100 mg total) by mouth 2 (two) times daily.   Eliquis 5 MG Tabs tablet Generic drug: apixaban Take 5 mg by mouth 2 (two) times daily.   meloxicam 15 MG tablet Commonly known as: MOBIC Take 15 mg by mouth daily as needed.   memantine 5 MG tablet Commonly known as: NAMENDA Take 5 mg by mouth 2 (two) times daily.   rOPINIRole 1 MG tablet Commonly known as: REQUIP Take 1 mg by mouth 4 (four) times daily.   rosuvastatin 10 MG tablet Commonly known as: CRESTOR TAKE 1 TABLET BY MOUTH EVERY DAY What changed: when to take this   sertraline 100 MG tablet Commonly known as: ZOLOFT Take 100 mg by mouth every morning.   tamsulosin 0.4 MG Caps capsule Commonly known as: FLOMAX TAKE  1 CAPSULE BY MOUTH EVERYDAY AT BEDTIME What changed: See the new instructions.   VITAMIN D3 PO Take 1 tablet by mouth daily.         Family History: Family History  Problem Relation Age of Onset   Cancer Mother        lung   Heart disease Father    Hearing loss Sister        coclear implant   Hearing loss Brother    Cancer Paternal Grandmother    Cancer Son        colon cancer   Colon cancer Neg Hx    Stomach cancer Neg Hx     Social History:  reports that he quit smoking about 31 years ago. His smoking use included cigarettes. He started smoking about 66 years ago. He has a 52.5 pack-year smoking history. He quit smokeless tobacco use about 31 years ago. He reports that he does not currently use alcohol after a past usage of about 1.0 standard drink of alcohol per week. He reports that he does not use drugs.   Physical Exam: Ht 5\' 10"  (1.778 m)   Wt 214 lb (97.1 kg)   BMI 30.71 kg/m   Constitutional:  Alert and oriented, No acute distress. HEENT: [place] AT, moist mucus membranes.  Trachea midline, no masses. Neurologic: Grossly intact, no focal deficits,  moving all 4 extremities. Psychiatric: Normal mood and affect.    Assessment & Plan:    1. Sebaceous cyst -The wound appears to be healing very well. Skin is closed. There is minimal erythema, no drainage, and no induration, status post excision.  2. BPH with weak stream -Currently on Flomax with refractory symptoms.  -He would like to consider an outlet procedure. Will schedule a cysto TRUS for anatomic evaluation. We discussed the procedure and all questions were answered.    Return in about 5 weeks (around 09/06/2023) for cysto TRUS.     [facility] [address] [place], [place] [address] [phone]

## 2023-08-02 NOTE — Patient Instructions (Signed)
Cystoscopy Cystoscopy is a procedure that is used to help diagnose and sometimes treat conditions that affect the lower urinary tract. The lower urinary tract includes the bladder and the urethra. The urethra is the tube that drains urine from the bladder. Cystoscopy is done using a thin, tube-shaped instrument with a light and camera at the end (cystoscope). The cystoscope may be hard or flexible, depending on the goal of the procedure. The cystoscope is inserted through the urethra, into the bladder. Cystoscopy may be recommended if you have: Urinary tract infections that keep coming back. Blood in the urine (hematuria). An inability to control when you urinate (urinary incontinence) or an overactive bladder. Unusual cells found in a urine sample. A blockage in the urethra, such as a urinary stone. Painful urination. An abnormality in the bladder found during an intravenous pyelogram (IVP) or CT scan. What are the risks? Generally, this is a safe procedure. However, problems may occur, including: Infection. Bleeding.  What happens during the procedure?  You will be given one or more of the following: A medicine to numb the area (local anesthetic). The area around the opening of your urethra will be cleaned. The cystoscope will be passed through your urethra into your bladder. Germ-free (sterile) fluid will flow through the cystoscope to fill your bladder. The fluid will stretch your bladder so that your health care provider can clearly examine your bladder walls. Your doctor will look at the urethra and bladder. The cystoscope will be removed The procedure may vary among health care providers  What can I expect after the procedure? After the procedure, it is common to have: Some soreness or pain in your urethra. Urinary symptoms. These include: Mild pain or burning when you urinate. Pain should stop within a few minutes after you urinate. This may last for up to a few days after the  procedure. A small amount of blood in your urine for several days. Feeling like you need to urinate but producing only a small amount of urine. Follow these instructions at home: General instructions Return to your normal activities as told by your health care provider.  Drink plenty of fluids after the procedure. Keep all follow-up visits as told by your health care provider. This is important. Contact a health care provider if you: Have pain that gets worse or does not get better with medicine, especially pain when you urinate lasting longer than 72 hours after the procedure. Have trouble urinating. Get help right away if you: Have blood clots in your urine. Have a fever or chills. Are unable to urinate. Summary Cystoscopy is a procedure that is used to help diagnose and sometimes treat conditions that affect the lower urinary tract. Cystoscopy is done using a thin, tube-shaped instrument with a light and camera at the end. After the procedure, it is common to have some soreness or pain in your urethra. It is normal to have blood in your urine after the procedure.  If you were prescribed an antibiotic medicine, take it as told by your health care provider.  This information is not intended to replace advice given to you by your health care provider. Make sure you discuss any questions you have with your health care provider. Document Revised: 12/04/2018 Document Reviewed: 12/04/2018 Elsevier Patient Education  2020 Elsevier Inc.   Transrectal Ultrasound  A transrectal ultrasound is a procedure that uses sound waves to create images of the prostate gland and nearby tissues. For this procedure, an ultrasound probe is placed  in the rectum. The probe sends sound waves through the wall of the rectum into the prostate gland. The prostate is a walnut-sized gland that is located below the bladder and in front of the rectum. The images show the size and shape of the prostate gland and nearby  structures. You may need this test if you have: Trouble urinating. Trouble getting your partner pregnant (infertility). An abnormal result from a prostate screening exam. Tell a health care provider about: Any allergies you have. All medicines you are taking, including vitamins, herbs, eye drops, creams, and over-the-counter medicines. Any bleeding problems you have. Any surgeries you have had. Any medical conditions you have. Any prostate infections you have had. What are the risks? Generally, this is a safe procedure. However, problems may occur, including: Discomfort during the procedure. Blood in your urine or sperm after the procedure. This may occur if a sample of tissue is taken to look at under a microscope (biopsy) during the procedure. What happens before the procedure? Your health care provider may instruct you to use an enema 1-4 hours before the procedure. Follow instructions from your health care provider about how to do the enema. Ask your health care provider about: Changing or stopping your regular medicines. This is especially important if you are taking diabetes medicines or blood thinners. Taking medicines such as aspirin and ibuprofen. These medicines can thin your blood. Do not take these medicines unless your health care provider tells you to take them. Taking over-the-counter medicines, vitamins, herbs, and supplements. What happens during the procedure? You will be asked to lie down on your left side on an exam table. You will bend your knees toward your chest. Gel will be put on a small probe that is about the width of a finger. The probe will be gently inserted into your rectum. You may have a feeling of fullness but should not feel pain. The probe will send signals to a computer that will create images. These will be displayed on a monitor that looks like a small television screen. The technician will slightly rotate the probe throughout the procedure. While  rotating the probe, he or she will view and capture images of the prostate gland and the surrounding structures from different angles. Your health care provider may take a biopsy sample of prostate tissue during the procedure. The images captured from the ultrasound will help guide the needle that is used to remove a sample of tissue. The sample will be sent to a lab for testing. The probe will be removed. The procedure may vary among health care providers and hospitals. What can I expect after the procedure? It is up to you to get the results of your procedure. Ask your health care provider, or the department that is doing the procedure, when your results will be ready. Keep all follow-up visits. This is important. Summary A transrectal ultrasound is a procedure that uses sound waves to create images of the prostate gland and nearby tissues. The images show the size and shape of the prostate gland and nearby structures. Before the procedure, ask your health care provider about changing or stopping your regular medicines. This is especially important if you are taking diabetes medicines or blood thinners. This information is not intended to replace advice given to you by your health care provider. Make sure you discuss any questions you have with your health care provider. Document Revised: 08/25/2021 Document Reviewed: 06/07/2021 Elsevier Patient Education  2024 [employer].

## 2023-08-09 ENCOUNTER — Ambulatory Visit: Payer: PPO | Admitting: Urology

## 2023-08-09 ENCOUNTER — Other Ambulatory Visit: Payer: PPO | Admitting: Urology

## 2023-08-13 ENCOUNTER — Other Ambulatory Visit: Payer: Self-pay | Admitting: Family Medicine

## 2023-08-13 DIAGNOSIS — E782 Mixed hyperlipidemia: Secondary | ICD-10-CM

## 2023-08-29 DIAGNOSIS — G20A1 Parkinson's disease without dyskinesia, without mention of fluctuations: Secondary | ICD-10-CM | POA: Diagnosis not present

## 2023-08-29 DIAGNOSIS — I10 Essential (primary) hypertension: Secondary | ICD-10-CM | POA: Diagnosis not present

## 2023-08-29 DIAGNOSIS — Z7901 Long term (current) use of anticoagulants: Secondary | ICD-10-CM | POA: Diagnosis not present

## 2023-08-29 DIAGNOSIS — I495 Sick sinus syndrome: Secondary | ICD-10-CM | POA: Diagnosis not present

## 2023-08-29 DIAGNOSIS — F0280 Dementia in other diseases classified elsewhere without behavioral disturbance: Secondary | ICD-10-CM | POA: Diagnosis not present

## 2023-08-29 DIAGNOSIS — D692 Other nonthrombocytopenic purpura: Secondary | ICD-10-CM | POA: Diagnosis not present

## 2023-08-29 DIAGNOSIS — I4891 Unspecified atrial fibrillation: Secondary | ICD-10-CM | POA: Diagnosis not present

## 2023-09-12 ENCOUNTER — Encounter: Payer: PPO | Admitting: Urology

## 2023-09-14 ENCOUNTER — Ambulatory Visit: Payer: Self-pay | Admitting: *Deleted

## 2023-09-14 NOTE — Telephone Encounter (Signed)
  Chief Complaint: hives around waist and buttocks  Symptoms: clumpy patches, flesh colored. Itching, taking bendadryl and tried allergy relief medication and applying cortizine OTC cream with no relief.  Frequency: 3 weeks  Pertinent Negatives: Patient denies fever no spreading .  Disposition: [] ED /[] Urgent Care (no appt availability in office) / [x] Appointment(In office/virtual)/ []  [facility]/ [] Home Care/ [] Refused Recommended Disposition /[] [facility]/ []  Follow-up with PCP Additional Notes:   No available appt with PCP until next Wednesday. Patient wife requesting appt Monday. Scheduled with another provider on 09/18/23.      Reason for Disposition  Hives persist > 1 week  Answer Assessment - Initial Assessment Questions 1. APPEARANCE: "What does the rash look like?"      Clumpy patches, flesh color 2. LOCATION: "Where is the rash located?"      Waits and buttocks area  3. NUMBER: "How many hives are there?"      Multiple areas and sizes 4. SIZE: "How big are the hives?" (inches, cm, compare to coins) "Do they all look the same or is there lots of variation in shape and size?"      Length of hand multiple sizes 5. ONSET: "When did the hives begin?" (Hours or days ago)      3 weeks  6. ITCHING: "Does it itch?" If Yes, ask: "How bad is the itch?"    - MILD: doesn't interfere with normal activities   - MODERATE-SEVERE: interferes with work, school, sleep, or other activities      Some itching  7. RECURRENT PROBLEM: "Have you had hives before?" If Yes, ask: "When was the last time?" and "What happened that time?"      Na  8. TRIGGERS: "Were you exposed to any new food, plant, cosmetic product or animal just before the hives began?"     No 9. OTHER SYMPTOMS: "Do you have any other symptoms?" (e.g., fever, tongue swelling, difficulty breathing, abdomen pain)     no 10. PREGNANCY: "Is there any chance you are pregnant?" "When was your last menstrual  period?"       na  Protocols used: Hives-A-AH

## 2023-09-18 ENCOUNTER — Ambulatory Visit (INDEPENDENT_AMBULATORY_CARE_PROVIDER_SITE_OTHER): Payer: PPO | Admitting: Nurse Practitioner

## 2023-09-18 ENCOUNTER — Other Ambulatory Visit: Payer: Self-pay

## 2023-09-18 ENCOUNTER — Encounter: Payer: Self-pay | Admitting: Nurse Practitioner

## 2023-09-18 VITALS — BP 122/84 | HR 68 | Temp 97.8°F | Resp 16 | Ht 70.0 in | Wt 212.1 lb

## 2023-09-18 DIAGNOSIS — L509 Urticaria, unspecified: Secondary | ICD-10-CM | POA: Diagnosis not present

## 2023-09-18 MED ORDER — FAMOTIDINE 20 MG PO TABS
20.0000 mg | ORAL_TABLET | Freq: Two times a day (BID) | ORAL | 0 refills | Status: DC
Start: 2023-09-18 — End: 2023-10-10

## 2023-09-18 MED ORDER — PREDNISONE 10 MG (21) PO TBPK: ORAL_TABLET | ORAL | 0 refills | Status: DC

## 2023-09-18 NOTE — Progress Notes (Signed)
BP 122/84   Pulse 68   Temp 97.8 F (36.6 C) (Oral)   Resp 16   Ht 5\' 10"  (1.778 m)   Wt 212 lb 1.6 oz (96.2 kg)   SpO2 98%   BMI 30.43 kg/m    Subjective:    Patient ID: Stephen Stein, male    DOB: [birth year], 82 y.o.   MRN: [id number]  HPI: Stephen Stein is a 82 y.o. male  Chief Complaint  Patient presents with   Urticaria    On abdomen, itching   Hives: patient has hives on his abdomen and thigh. He says he has not had any new soaps, foods or detergents.  He reports he noticed it a few days ago. He reports that it is pruritic. He says that he put hydrocortisone cream on it which helped but it came back. He also reports that he did take benadryl which also helped. Recommend taking benadryl at night, zyrtec or Claritin in the am and Pepcid 2 times a day.  Also will start steroid taper.    Relevant past medical, surgical, family and social history reviewed and updated as indicated. Interim medical history since our last visit reviewed. Allergies and medications reviewed and updated.  Review of Systems  Ten systems reviewed and is negative except as mentioned in HPI       Objective:    BP 122/84   Pulse 68   Temp 97.8 F (36.6 C) (Oral)   Resp 16   Ht 5\' 10"  (1.778 m)   Wt 212 lb 1.6 oz (96.2 kg)   SpO2 98%   BMI 30.43 kg/m   Wt Readings from Last 3 Encounters:  09/18/23 212 lb 1.6 oz (96.2 kg)  08/02/23 214 lb (97.1 kg)  07/13/23 215 lb (97.5 kg)    Physical Exam  Constitutional: Patient appears well-developed and well-nourished. Obese  No distress.  HEENT: head atraumatic, normocephalic, pupils equal and reactive to light, neck supple, throat within normal limits Cardiovascular: Normal rate, regular rhythm and normal heart sounds.  No murmur heard. No BLE edema. Pulmonary/Chest: Effort normal and breath sounds normal. No respiratory distress. Abdominal: Soft.  There is no tenderness. Skin: hives on lower abdomen and thigh Psychiatric: Patient has a normal  mood and affect. behavior is normal. Judgment and thought content normal.  Results for orders placed or performed during the hospital encounter of 07/13/23  Aerobic/Anaerobic Culture w Gram Stain (surgical/deep wound)   Specimen: Path fluid; Body Fluid  Result Value Ref Range   Specimen Description      FLUID Performed at [facility], [address] [place], [place] [address]    Special Requests      NONE Performed at [facility], [address] [place], [place] [address]    Gram Stain      FEW WBC PRESENT,BOTH PMN AND MONONUCLEAR FEW GRAM POSITIVE COCCI IN PAIRS    Culture      FEW ACTINOTIGNUM SCHAALII FEW GRAM VARIABLE ROD SENT TO [facility] FOR IDENTIFICATION See Scanned report in [facility] Link Performed at [facility], [address], [place], [place] [address]    Report Status 07/28/2023 FINAL   Miscellaneous [facility] test (send-out)  Result Value Ref Range   [facility] test code [id number] GRAM VARIABLE ROD ANAEROBIC SCROTAL MASS    [facility] test name      [id number] Organism Identification, Anaerobic Bacteria GRAM VARIABLE ROD ANAEROBIC SCROTAL MASS   Misc [facility] result COMMENT  Assessment & Plan:   Problem List Items Addressed This Visit   None Visit Diagnoses     Hives    -  Primary   Recommend taking benadryl at night, zyrtec or Claritin in the am and Pepcid 2 times a day.  Also will start steroid taper.   Relevant Medications   predniSONE (STERAPRED UNI-PAK 21 TAB) 10 MG (21) TBPK tablet   famotidine (PEPCID) 20 MG tablet        Follow up plan: Return if symptoms worsen or fail to improve.

## 2023-09-18 NOTE — Patient Instructions (Signed)
Benadryl at bedtime Zyrtec or claritin in am Pepcid two times a day Start steroid taper tomorrow

## 2023-09-19 ENCOUNTER — Ambulatory Visit: Payer: PPO | Admitting: Urology

## 2023-09-19 ENCOUNTER — Encounter: Payer: Self-pay | Admitting: Urology

## 2023-09-19 VITALS — BP 145/74 | HR 51 | Ht 70.0 in | Wt 210.4 lb

## 2023-09-19 DIAGNOSIS — N401 Enlarged prostate with lower urinary tract symptoms: Secondary | ICD-10-CM | POA: Diagnosis not present

## 2023-09-19 DIAGNOSIS — R3912 Poor urinary stream: Secondary | ICD-10-CM | POA: Diagnosis not present

## 2023-09-19 NOTE — H&P (View-Only) (Signed)
09/19/23  Chief Complaint  Patient presents with   Cysto    TRUS     HPI: 82 y.o. year-old male with refractory urinary symptoms related to BPH who presents today to the office for cystoscopy and prostate sizing   Please see previous notes for details.  BPH with refractory symptoms on Flomax.   Blood pressure (!) 145/74, pulse (!) 51, height 5\' 10"  (1.778 m), weight 210 lb 6 oz (95.4 kg). NED. A&Ox3.   No respiratory distress   Abd soft, NT, ND Normal phallus with bilateral descended testicles    Cystoscopy Procedure Note  Patient identification was confirmed, informed consent was obtained, and patient was prepped using Betadine solution.  Lidocaine jelly was administered per urethral meatus.    Preoperative abx where received prior to procedure.     Pre-Procedure: - Inspection reveals a normal caliber ureteral meatus.  Procedure: The flexible cystoscope was introduced without difficulty - No urethral strictures/lesions are present. - Normal prostate but with relatively tight prostatic fossa - Normal bladder neck - Bilateral ureteral orifices identified - Bladder mucosa  reveals no ulcers, tumors, or lesions - No bladder stones -Moderate trabeculation  Retroflexion shows unremarkable   Post-Procedure: - Patient tolerated the procedure well   Prostate transrectal ultrasound sizing   Informed consent was obtained after discussing risks/benefits of the procedure.  A time out was performed to ensure correct patient identity.   Pre-Procedure: -Transrectal probe was placed without difficulty -Transrectal Ultrasound performed revealing a 32 gm prostate measuring 2.89 x 4.51 x 4.69 cm (length) -No significant hypoechoic or median lobe noted      Assessment/ Plan:  1. Benign prostatic hyperplasia with weak urinary stream Ongoing issues on Flomax including weak stream but also intermittent days with severe urinary urgency frequency which come and go  Bladder is  trabeculated consistent with probable outlet obstruction however the prostate is relatively small albeit tight through the prostatic fossa.  Discussed options today including the addition of OAB type medications specifically for this issue.  Alternatively could consider outlet procedure such as TURP or UroLift.  Specifically went with detail about UroLift including the procedure itself, perioperative and postoperative course, side effect profile amongst others.  He is unsure how would like to proceed.  He like to circle back in a few weeks.  Will provide him with literature about UroLift.  Could also consider urodynamics to assess for wall of obstruction his urinary symptoms.  He like to consider his options.  Follow-up in 4 weeks with IPSS/PVR.   [name], MD

## 2023-09-19 NOTE — Progress Notes (Signed)
09/19/23  Chief Complaint  Patient presents with   Cysto    TRUS     HPI: 82 y.o. year-old male with refractory urinary symptoms related to BPH who presents today to the office for cystoscopy and prostate sizing   Please see previous notes for details.  BPH with refractory symptoms on Flomax.   Blood pressure (!) 145/74, pulse (!) 51, height 5\' 10"  (1.778 m), weight 210 lb 6 oz (95.4 kg). NED. A&Ox3.   No respiratory distress   Abd soft, NT, ND Normal phallus with bilateral descended testicles    Cystoscopy Procedure Note  Patient identification was confirmed, informed consent was obtained, and patient was prepped using Betadine solution.  Lidocaine jelly was administered per urethral meatus.    Preoperative abx where received prior to procedure.     Pre-Procedure: - Inspection reveals a normal caliber ureteral meatus.  Procedure: The flexible cystoscope was introduced without difficulty - No urethral strictures/lesions are present. - Normal prostate but with relatively tight prostatic fossa - Normal bladder neck - Bilateral ureteral orifices identified - Bladder mucosa  reveals no ulcers, tumors, or lesions - No bladder stones -Moderate trabeculation  Retroflexion shows unremarkable   Post-Procedure: - Patient tolerated the procedure well   Prostate transrectal ultrasound sizing   Informed consent was obtained after discussing risks/benefits of the procedure.  A time out was performed to ensure correct patient identity.   Pre-Procedure: -Transrectal probe was placed without difficulty -Transrectal Ultrasound performed revealing a 32 gm prostate measuring 2.89 x 4.51 x 4.69 cm (length) -No significant hypoechoic or median lobe noted      Assessment/ Plan:  1. Benign prostatic hyperplasia with weak urinary stream Ongoing issues on Flomax including weak stream but also intermittent days with severe urinary urgency frequency which come and go  Bladder is  trabeculated consistent with probable outlet obstruction however the prostate is relatively small albeit tight through the prostatic fossa.  Discussed options today including the addition of OAB type medications specifically for this issue.  Alternatively could consider outlet procedure such as TURP or UroLift.  Specifically went with detail about UroLift including the procedure itself, perioperative and postoperative course, side effect profile amongst others.  He is unsure how would like to proceed.  He like to circle back in a few weeks.  Will provide him with literature about UroLift.  Could also consider urodynamics to assess for wall of obstruction his urinary symptoms.  He like to consider his options.  Follow-up in 4 weeks with IPSS/PVR.   [name], MD

## 2023-09-19 NOTE — Patient Instructions (Signed)
How The UroLiftT System Works The UroLiftT System procedure can be performed as a same-day outpatient procedure, including the office setting, under local anesthesia.1 Patients typically return home with no overnight stay and no catheter required after treatment.2  Here's how it works.  Enlarged Prostate An enlarged prostate can narrow or even block the urethra.   Step 1 The UroLiftT Delivery Device is placed through the obstructed urethra to access the enlarged prostate.   Step 2 Small UroLiftT Implants are permanently placed to lift and hold the enlarged prostate tissue out of the way.   Step 3 The UroLiftT Delivery Device is removed, leaving an open urethera to improve flow and provide lasting BPH symptom relief.

## 2023-09-20 ENCOUNTER — Other Ambulatory Visit: Payer: Self-pay

## 2023-09-20 ENCOUNTER — Telehealth: Payer: Self-pay

## 2023-09-20 DIAGNOSIS — N401 Enlarged prostate with lower urinary tract symptoms: Secondary | ICD-10-CM

## 2023-09-20 DIAGNOSIS — N138 Other obstructive and reflux uropathy: Secondary | ICD-10-CM

## 2023-09-20 NOTE — Progress Notes (Signed)
Surgical Physician Order Form [facility]  Dr. [name], MD  * Scheduling expectation : Next Available  *Length of Case:   *Clearance needed: no  *Anticoagulation Instructions: Hold all anticoagulants  *Aspirin Instructions: Hold Aspirin  *Post-op visit Date/Instructions:  4-6 week w/PVR  *Diagnosis: BPH w/urinary obstruction  *Procedure: UroLift   Additional orders: N/A  -Admit type: OUTpatient  -Anesthesia: MAC  -VTE Prophylaxis Standing Order SCD's       Other:   -Standing Lab Orders Per Anesthesia    Lab other: UA&Urine Culture  -Standing Test orders EKG/Chest x-ray per Anesthesia       Test other:   - Medications:  Ancef 2gm IV  -Other orders:  N/A

## 2023-09-20 NOTE — Telephone Encounter (Signed)
Patient decided he would like to proceed with Urolift. If you will send me orders I will get him scheduled.

## 2023-09-20 NOTE — Telephone Encounter (Signed)
done

## 2023-09-21 DIAGNOSIS — G4733 Obstructive sleep apnea (adult) (pediatric): Secondary | ICD-10-CM | POA: Diagnosis not present

## 2023-09-22 ENCOUNTER — Telehealth: Payer: Self-pay

## 2023-09-22 ENCOUNTER — Telehealth: Payer: Self-pay | Admitting: *Deleted

## 2023-09-22 ENCOUNTER — Encounter: Payer: Self-pay | Admitting: Cardiovascular Disease

## 2023-09-22 ENCOUNTER — Other Ambulatory Visit: Payer: PPO

## 2023-09-22 DIAGNOSIS — N138 Other obstructive and reflux uropathy: Secondary | ICD-10-CM | POA: Diagnosis not present

## 2023-09-22 DIAGNOSIS — N401 Enlarged prostate with lower urinary tract symptoms: Secondary | ICD-10-CM | POA: Diagnosis not present

## 2023-09-22 LAB — URINALYSIS, COMPLETE
Bilirubin, UA: NEGATIVE
Glucose, UA: NEGATIVE
Ketones, UA: NEGATIVE
Leukocytes,UA: NEGATIVE
Nitrite, UA: NEGATIVE
Protein,UA: NEGATIVE
Specific Gravity, UA: 1.015 (ref 1.005–1.030)
Urobilinogen, Ur: 0.2 mg/dL (ref 0.2–1.0)
pH, UA: 5.5 (ref 5.0–7.5)

## 2023-09-22 LAB — MICROSCOPIC EXAMINATION: Bacteria, UA: NONE SEEN

## 2023-09-22 NOTE — Telephone Encounter (Signed)
-----   Message from [facility] Melissa H sent at 09/22/2023  9:40 AM EDT ----- Surgical Clearance

## 2023-09-22 NOTE — Telephone Encounter (Signed)
Patient with diagnosis of afib on Eliquis for anticoagulation.    Procedure: Cystoscopy with Insertion of Urolift  Date of procedure: 09/28/2023   CHA2DS2-VASc Score = 4   This indicates a 4.8% annual risk of stroke. The patient's score is based upon: CHF History: 0 HTN History: 1 Diabetes History: 0 Stroke History: 0 Vascular Disease History: 1 Age Score: 2 Gender Score: 0    CrCl 63 mL/min Platelet count 228 K    Per office protocol, patient can hold Eliquis for 2 days prior to procedure.     **This guidance is not considered finalized until pre-operative APP has relayed final recommendations.**

## 2023-09-22 NOTE — Progress Notes (Signed)
  Phone Number: [phone] for Surgical Coordinator Fax Number: [phone]  REQUEST FOR SURGICAL CLEARANCE       Date: Date: 09/28/2023  Faxed to: [facility]  Surgeon: Dr. [name], MD     Date of Surgery: Thursday October 3rd, 2024  Operation: Cystoscopy with Insertion of Urolift   Anesthesia Type: MAC   Diagnosis: Benign Prostatic Hyperplasia with Urinary Obstruction   Patient Requires:   Cardiac / Vascular Clearance : Yes  Reason: Will need patient to hold Eliquis prior to surgery  Risk Assessment:    Low   []       Moderate   []     High   []           This patient is optimized for surgery  YES []       NO   []    I recommend further assessment/workup prior to surgery. YES []      NO  []   Appointment scheduled for: _______________________   Further recommendations: ____________________________________     Physician Signature:__________________________________   Printed Name: ________________________________________   Date: _________________

## 2023-09-22 NOTE — Telephone Encounter (Signed)
Per Dr. Apolinar Junes, Patient is to be scheduled for Cystoscopy with Insertion of Urolift   Stephen Stein was contacted and possible surgical dates were discussed, Thursday October 3rd, 2024 was agreed upon for surgery.   Patient was instructed that Dr. Apolinar Junes will require them to provide a pre-op UA & CX prior to surgery. This was ordered and scheduled drop off appointment was made for 09/22/2023.    Patient was directed to call [phone] between 1-3pm the day before surgery to find out surgical arrival time.  Instructions were given not to eat or drink from midnight on the night before surgery and have a driver for the day of surgery. On the surgery day patient was instructed to enter through the [facility] entrance of [facility] report the Same Day Surgery desk.   Pre-Admit Testing will be in contact via phone to set up an interview with the anesthesia team to review your history and medications prior to surgery.   Reminder of this information was sent via MyChart to the patient.

## 2023-09-22 NOTE — Progress Notes (Signed)
   [facility] Surgical Posting Form  Surgery Date: Date: 09/28/2023  Surgeon: Dr. [name], MD  Inpt ( No  )   Outpt (Yes)   Obs ( No  )   Diagnosis: N40.1, N13.8 Benign Prostatic Hyperplasia with Urinary Obstruction  -CPT: 52441, [phone]  Surgery: Cystoscopy with Insertion of Urolift  Stop Anticoagulations: Yes and also hold ASA  Cardiac/Medical/Pulmonary Clearance needed: yes  Clearance needed from Dr: Mariah Milling  Clearance request sent on: Date: 09/22/23  *Orders entered into EPIC  Date: 09/22/23   *Case booked in EPIC  Date: 09/22/23  *Notified pt of Surgery: Date: 09/22/23  PRE-OP UA & CX: yes, will obtain in clinic on 09/22/2023  *Placed into Prior Authorization Work Angela Nevin Date: 09/22/23  Assistant/laser/rep:No

## 2023-09-22 NOTE — Telephone Encounter (Signed)
Name: Stephen Stein  DOB: 04/21/1941  MRN: [id number]  Primary Cardiologist: Julien Nordmann, MD   Preoperative team, please contact this patient and set up a phone call appointment for further preoperative risk assessment. Please obtain consent and complete medication review. Thank you for your help.  I confirm that guidance regarding antiplatelet and oral anticoagulation therapy has been completed and, if necessary, noted below.  I also confirmed the patient resides in the state of [place]. As per [facility] Medical Board telemedicine laws, the patient must reside in the state in which the provider is licensed.   Marcelino Duster, PA 09/22/2023, 2:42 PM [facility]

## 2023-09-22 NOTE — Telephone Encounter (Signed)
I will need to reach out to the pre op APP as to where to add pt on as we have no available time slots.

## 2023-09-22 NOTE — Telephone Encounter (Signed)
Signed       Phone Number: [phone] for Surgical Coordinator Fax Number: [phone]   REQUEST FOR SURGICAL CLEARANCE                                           Date: Date: 09/28/2023   Faxed to: [facility]   Surgeon: Dr. [name], MD          Date of Surgery: Thursday October 3rd, 2024   Operation: Cystoscopy with Insertion of Urolift    Anesthesia Type: MAC    Diagnosis: Benign Prostatic Hyperplasia with Urinary Obstruction    Patient Requires:    Cardiac / Vascular Clearance : Yes   Reason: Will need patient to hold Eliquis prior to surgery   Risk Assessment:     Low   []       Moderate   []     High   []                 This patient is optimized for surgery  YES []       NO   []      I recommend further assessment/workup prior to surgery. YES []      NO  []    Appointment scheduled for: _______________________    Further recommendations: ____________________________________        Physician Signature:__________________________________    Printed Name: ________________________________________    Date: _________________

## 2023-09-22 NOTE — Telephone Encounter (Signed)
1st attempt to reach pt regarding surgical clearance and the need for a tele visit.  Left a message for pt to call back and ask for the preop team. 

## 2023-09-25 ENCOUNTER — Telehealth: Payer: Self-pay

## 2023-09-25 ENCOUNTER — Ambulatory Visit: Payer: PPO | Attending: Nurse Practitioner

## 2023-09-25 ENCOUNTER — Telehealth: Payer: Self-pay | Admitting: *Deleted

## 2023-09-25 DIAGNOSIS — Z01810 Encounter for preprocedural cardiovascular examination: Secondary | ICD-10-CM | POA: Diagnosis not present

## 2023-09-25 LAB — CULTURE, URINE COMPREHENSIVE

## 2023-09-25 MED ORDER — NITROFURANTOIN MONOHYD MACRO 100 MG PO CAPS: 100.0000 mg | ORAL_CAPSULE | Freq: Two times a day (BID) | ORAL | 0 refills | Status: DC

## 2023-09-25 NOTE — Telephone Encounter (Signed)
-----   Message from [name] sent at 09/25/2023  2:03 PM EDT ----- Please treat with Macrobid 100 mg twice a day starting 7 days before his procedure for total of 10 days.  [name], MD

## 2023-09-25 NOTE — Telephone Encounter (Signed)
Spoke with pt. Pt. Advised of results. Medication has been sent to [facility] per pt. Request. Patient verbalized understanding.

## 2023-09-25 NOTE — Telephone Encounter (Signed)
I was able to reach the pt on his cell phone. Pt has been added on at 11 am per pre op APP Robin Searing, NP. Med rec and consent are done.     Patient Consent for Virtual Visit        Stephen Stein has provided verbal consent on 09/25/2023 for a virtual visit (video or telephone).   CONSENT FOR VIRTUAL VISIT FOR:  Stephen Stein  By participating in this virtual visit I agree to the following:  I hereby voluntarily request, consent and authorize [facility] and its employed or contracted physicians, physician assistants, nurse practitioners or other licensed health care professionals (the Practitioner), to provide me with telemedicine health care services (the "Services") as deemed necessary by the treating Practitioner. I acknowledge and consent to receive the Services by the Practitioner via telemedicine. I understand that the telemedicine visit will involve communicating with the Practitioner through live audiovisual communication technology and the disclosure of certain medical information by electronic transmission. I acknowledge that I have been given the opportunity to request an in-person assessment or other available alternative prior to the telemedicine visit and am voluntarily participating in the telemedicine visit.  I understand that I have the right to withhold or withdraw my consent to the use of telemedicine in the course of my care at any time, without affecting my right to future care or treatment, and that the Practitioner or I may terminate the telemedicine visit at any time. I understand that I have the right to inspect all information obtained and/or recorded in the course of the telemedicine visit and may receive copies of available information for a reasonable fee.  I understand that some of the potential risks of receiving the Services via telemedicine include:  Delay or interruption in medical evaluation due to technological equipment failure or  disruption; Information transmitted may not be sufficient (e.g. poor resolution of images) to allow for appropriate medical decision making by the Practitioner; and/or  In rare instances, security protocols could fail, causing a breach of personal health information.  Furthermore, I acknowledge that it is my responsibility to provide information about my medical history, conditions and care that is complete and accurate to the best of my ability. I acknowledge that Practitioner's advice, recommendations, and/or decision may be based on factors not within their control, such as incomplete or inaccurate data provided by me or distortions of diagnostic images or specimens that may result from electronic transmissions. I understand that the practice of medicine is not an exact science and that Practitioner makes no warranties or guarantees regarding treatment outcomes. I acknowledge that a copy of this consent can be made available to me via my patient portal [facility] MyChart), or I can request a printed copy by calling the office of [facility].    I understand that my insurance will be billed for this visit.   I have read or had this consent read to me. I understand the contents of this consent, which adequately explains the benefits and risks of the Services being provided via telemedicine.  I have been provided ample opportunity to ask questions regarding this consent and the Services and have had my questions answered to my satisfaction. I give my informed consent for the services to be provided through the use of telemedicine in my medical care

## 2023-09-25 NOTE — Telephone Encounter (Signed)
Patient returned call

## 2023-09-25 NOTE — Progress Notes (Signed)
Virtual Visit via Telephone Note   Because of Stephen Stein's co-morbid illnesses, he is at least at moderate risk for complications without adequate follow up.  This format is felt to be most appropriate for this patient at this time.  The patient did not have access to video technology/had technical difficulties with video requiring transitioning to audio format only (telephone).  All issues noted in this document were discussed and addressed.  No physical exam could be performed with this format.  Please refer to the patient's chart for his consent to telehealth for Stephen Stein.  Evaluation Performed:  Preoperative cardiovascular risk assessment _____________   Date:  09/25/2023   Patient ID:  Stephen Stein, DOB Feb 12, 1941, MRN [id number] Patient Location:  Home Provider location:   Office  Primary Care Provider:  Danelle Berry, PA-C Primary Cardiologist:  Julien Nordmann, MD  Chief Complaint / Patient Profile   82 y.o. y/o male with a h/o aortic atherosclerosis, carotid stenosis (less than 50% bilaterally), coronary calcifications, HLD, HTN, LBBB, NSVT, sinus node dysfunction who is pending cystoscopy with insertion of UroLift and presents today for telephonic preoperative cardiovascular risk assessment.  History of Present Illness    Stephen Stein is a 82 y.o. male who presents via audio/video conferencing for a telehealth visit today.  Pt was last seen in cardiology clinic on 12/13/2022 by Dr. Mariah Milling.  At that time Stephen Stein was doing well with good rate control and no cardiac complaints. The patient is now pending procedure as outlined above. Since his last visit, he has been doing well with no new cardiac complaints since his previous visit.  He denies chest pain, shortness of breath, lower extremity edema, fatigue, palpitations, melena, hematuria, hemoptysis, diaphoresis, weakness, presyncope, syncope, orthopnea, and PND.    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 08/2018 s/p lap chole.   BPH (benign prostatic hyperplasia)    Carotid atherosclerosis, bilateral    a. 01/2019 Carotid U/S: <50% bilat ICA stenosis.   Constipation 10/21/2021   Coronary artery calcification seen on CT scan    a.) cCTA 12/16/2015: Ca2+ 892 (80th percentile for age/sex/race matched control). Diffuse Ca2+ in all 3 major coronary arteries; b.) 02/2016 MV: HTN response to exercise. Abnl ecg w/ horizontal inflat ST depression, but no ischemia/infarct on imaging; c.) 09/2018 MV: EF 64%, no ischemia. Very sm region of mild distal antsept defect, likely 2/2 LBBB   Depression    Diastolic dysfunction 10/09/2018   a.) TTE 10/09/2018: EF 45-50%, diff HK, mod LVH, mild reduced RVSF, triv AR, G1DD; b.) TTE 07/15/2022: EF 45-50%. mild LVH, mild LA dil, mild MR/AR, Ao root 42 mm   Hepatic steatosis    History of bilateral cataract extraction 11/2020   History of methicillin resistant staphylococcus aureus (MRSA) 12/01/2022   a.) RIGHT thigh wound   Hx of basal cell carcinoma 08/01/2019   R chest parasternal   Hyperlipidemia    Hypertension    Irregular heartbeat/Palpitations  a. Has been taking Atenolol for 20 years   LBBB (left bundle branch block)    Lightheadedness    Long term current use of anticoagulant    a.) apixaban   Mixed Alzheimer's and vascular dementia (HCC)    a.) on NMDA inhibitor (memantine)   Neuropathy    NSVT (nonsustained ventricular tachycardia) (HCC)    a. 11/2018 Zio monitor - 13 beats of NSVT--asymptomatic.   Orthostatic hypotension    a.  11/2018 pressures improved w/ midodrine/florinef.   Parkinson's disease    Pre-diabetes    Pulmonary nodules    RLS (restless legs syndrome)    a.) on ropinirole   Sebaceous cyst of scrotum    Second degree AV block, Mobitz type I    Sinus bradycardia    a. 11/2018 Zio monitor: Avg HR 57 (min 28). Intermittent Mobitz I.   Wears dentures    Partial upper   Wears hearing aid in both ears    Past Surgical History:  Procedure Laterality Date   ANKLE FRACTURE SURGERY Left 09/28/2022   [facility]   CATARACT EXTRACTION W/PHACO Left 12/09/2020   Procedure: CATARACT EXTRACTION PHACO AND INTRAOCULAR LENS PLACEMENT (IOC) LEFT 6.94 01:09.7 9.9% ;  Surgeon: Lockie Mola, MD;  Location: [facility];  Service: Ophthalmology;  Laterality: Left;   CATARACT EXTRACTION W/PHACO Right 12/23/2020   Procedure: CATARACT EXTRACTION PHACO AND INTRAOCULAR LENS PLACEMENT (IOC) RIGHT MALYUGIN;  Surgeon: Lockie Mola, MD;  Location: [facility];  Service: Ophthalmology;  Laterality: Right;  8.44 1:14.6 11.3%   CHOLECYSTECTOMY N/A 09/13/2018   Procedure: LAPAROSCOPIC CHOLECYSTECTOMY;  Surgeon: Leafy Ro, MD;  Location: [facility];  Service: General;  Laterality: N/A;   COLONOSCOPY  12/26/2006   ESOPHAGOGASTRODUODENOSCOPY (EGD) WITH PROPOFOL N/A 08/20/2018   Procedure: ESOPHAGOGASTRODUODENOSCOPY (EGD) WITH PROPOFOL;  Surgeon: Midge Minium, MD;  Location: [facility];  Service: Endoscopy;  Laterality: N/A;   EXCISION MASS HEAD Right 03/31/2023   Procedure: EXCISION LESION AURICLE;  Surgeon: Vernie Murders, MD;  Location: [facility];  Service: ENT;  Laterality: Right;   SCROTAL EXPLORATION N/A 07/13/2023   Procedure: EXCISION OF SCROTUM MASS;  Surgeon: [name], MD;  Location: [facility];  Service: Urology;  Laterality: N/A;   TONSILLECTOMY AND ADENOIDECTOMY  12/26/1953   TOTAL SHOULDER ARTHROPLASTY Left 09/11/2019   Procedure: TOTAL SHOULDER ARTHROPLASTY;  Surgeon: Lyndle Herrlich, MD;  Location: [facility];  Service: Orthopedics;  Laterality: Left;   TOTAL SHOULDER ARTHROPLASTY Right 08/02/2021   Procedure: TOTAL SHOULDER ARTHROPLASTY;  Surgeon: Lyndle Herrlich, MD;  Location: [facility];  Service: Orthopedics;  Laterality: Right;    Allergies  No Known Allergies  Home Medications    Prior to Admission medications   Medication Sig Start Date End Date Taking? Authorizing Provider  acetaminophen (TYLENOL) 500 MG tablet Take 1,000 mg by mouth every 6 (six) hours as needed for moderate pain.    [provider]  Cholecalciferol (VITAMIN D3 PO) Take 1 tablet by mouth daily.    [provider]  docusate sodium (COLACE) 100 MG capsule Take 1 capsule (100 mg total) by mouth 2 (two) times daily. 08/03/21   Altamese Cabal, PA-C  ELIQUIS 5 MG TABS tablet Take 5 mg by mouth 2 (two) times daily. 06/02/23   [provider]  famotidine (PEPCID) 20 MG tablet Take 1 tablet (20 mg total) by mouth 2 (two) times daily. 09/18/23   Berniece Salines, FNP  meloxicam (MOBIC) 15 MG tablet Take 15 mg  by mouth daily as needed.    [provider]  memantine (NAMENDA) 5 MG tablet Take 5 mg by mouth 2 (two) times daily. 08/15/22   [provider]  predniSONE (STERAPRED UNI-PAK 21 TAB) 10 MG (21) TBPK tablet Take as directed on package.  (60 mg po on day 1, 50 mg po on day 2...) 09/18/23   Berniece Salines, FNP  rOPINIRole (REQUIP) 1 MG tablet Take 1 mg by mouth 4 (four) times daily. 11/29/21   [provider]  rosuvastatin (CRESTOR) 10 MG tablet TAKE 1 TABLET BY MOUTH EVERY DAY 08/14/23   Danelle Berry, PA-C  sertraline (ZOLOFT) 100 MG tablet Take 100 mg by mouth every morning. 09/11/20   [provider]  tamsulosin (FLOMAX) 0.4 MG CAPS capsule TAKE 1 CAPSULE BY MOUTH EVERYDAY AT BEDTIME Patient taking differently: 0.4 mg daily after supper. 02/02/23   Danelle Berry, PA-C    Physical Exam    Vital Signs:  QUINTEL DELFIERRO does not have vital  signs available for review today.  Given telephonic nature of communication, physical exam is limited. AAOx3. NAD. Normal affect.  Speech and respirations are unlabored.  Accessory Clinical Findings    None  Assessment & Plan    1.  Preoperative Cardiovascular Risk Assessment: -Patient's RCRI score is 0.9%  The patient affirms he has been doing well without any new cardiac symptoms. They are able to achieve 6 METS without cardiac limitations. Therefore, based on ACC/AHA guidelines, the patient would be at acceptable risk for the planned procedure without further cardiovascular testing. The patient was advised that if he develops new symptoms prior to surgery to contact our office to arrange for a follow-up visit, and he verbalized understanding.   The patient was advised that if he develops new symptoms prior to surgery to contact our office to arrange for a follow-up visit, and he verbalized understanding.  Patient can hold Eliquis 2 days prior to procedure  A copy of this note will be routed to requesting surgeon.  Time:   Today, I have spent 6 minutes with the patient with telehealth technology discussing medical history, symptoms, and management plan.     Napoleon Form, Leodis Rains, NP  09/25/2023, 9:24 AM

## 2023-09-25 NOTE — Telephone Encounter (Signed)
I was able to reach the pt on his cell phone. Pt has been added on at 11 am per pre op APP Robin Searing, NP. Med rec and consent are done.

## 2023-09-25 NOTE — Telephone Encounter (Signed)
Patient can be added on today 09/25/2023.  Thanks, Alden Server

## 2023-09-25 NOTE — Telephone Encounter (Signed)
I called the pt back and left message to call back to set up tele appt for today. Per Robin Searing, NP add on due to procedure date and med hold.

## 2023-09-26 ENCOUNTER — Encounter: Payer: Self-pay | Admitting: Urology

## 2023-09-26 ENCOUNTER — Encounter
Admission: RE | Admit: 2023-09-26 | Discharge: 2023-09-26 | Disposition: A | Discharge status: Home / Self Care | Payer: PPO | Source: Ambulatory Visit | Attending: Urology | Admitting: Urology

## 2023-09-26 ENCOUNTER — Other Ambulatory Visit: Payer: Self-pay

## 2023-09-26 VITALS — Ht 70.0 in | Wt 210.0 lb

## 2023-09-26 DIAGNOSIS — I6523 Occlusion and stenosis of bilateral carotid arteries: Secondary | ICD-10-CM

## 2023-09-26 DIAGNOSIS — I447 Left bundle-branch block, unspecified: Secondary | ICD-10-CM

## 2023-09-26 DIAGNOSIS — Z01812 Encounter for preprocedural laboratory examination: Secondary | ICD-10-CM

## 2023-09-26 DIAGNOSIS — I25118 Atherosclerotic heart disease of native coronary artery with other forms of angina pectoris: Secondary | ICD-10-CM

## 2023-09-26 DIAGNOSIS — I10 Essential (primary) hypertension: Secondary | ICD-10-CM

## 2023-09-26 NOTE — Patient Instructions (Addendum)
Your procedure is scheduled on: Thursday, October 3 Report to the Registration Desk on the 1st floor of the [facility] To find out your arrival time, please call [phone] between 1PM - 3PM on: Wednesday, October 2 If your arrival time is 6:00 am, do not arrive before that time as the [facility] entrance doors do not open until 6:00 am.  REMEMBER: Instructions that are not followed completely may result in serious medical risk, up to and including death; or upon the discretion of your surgeon and anesthesiologist your surgery may need to be rescheduled.  Do not eat or drink after midnight the night before surgery.  No gum chewing or hard candies.  One week prior to surgery:  Stop meloxicam and Anti-inflammatories (NSAIDS) such as Advil, Aleve, Ibuprofen, Motrin, Naproxen, Naprosyn and Aspirin based products such as Excedrin, Goody's Powder, BC Powder. Stop ANY OVER THE COUNTER supplements until after surgery. Stop Vitamin D. You may however, continue to take Tylenol if needed for pain up until the day of surgery.  Eliquis - hold for 2 days before surgery. Last day to take is Monday, September 30. Resume AFTER surgery per surgeon's instruction.  Continue taking all other prescribed medications as usual.  TAKE ONLY THESE MEDICATIONS THE MORNING OF SURGERY WITH A SIP OF WATER:  Famotidine (Pepcid) - (take one the night before and one on the morning of surgery - helps to prevent nausea after surgery.) Memantine (Namenda) Nitrofurantoin (Macrobid) Ropinirole (Requip) Rosuvastatin (Crestor) Sertraline (Zoloft)  No Alcohol for 24 hours before or after surgery.  No Smoking including e-cigarettes for 24 hours before surgery.  No chewable tobacco products for at least 6 hours before surgery.  No nicotine patches on the day of surgery.  Do not use any "recreational" drugs for at least a week (preferably 2 weeks) before your surgery.  Please be advised that the combination of  cocaine and anesthesia may have negative outcomes, up to and including death. If you test positive for cocaine, your surgery will be cancelled.  On the morning of surgery brush your teeth with toothpaste and water, you may rinse your mouth with mouthwash if you wish. Do not swallow any toothpaste or mouthwash.  Do not wear jewelry, make-up, hairpins, clips or nail polish.  For welded (permanent) jewelry: bracelets, anklets, waist bands, etc.  Please have this removed prior to surgery.  If it is not removed, there is a chance that hospital personnel will need to cut it off on the day of surgery.  Do not wear lotions, powders, or perfumes.   Do not shave body hair from the neck down 48 hours before surgery.  Contact lenses, hearing aids and dentures may not be worn into surgery.  Do not bring valuables to the hospital. [facility] is not responsible for any missing/lost belongings or valuables.   Bring your C-PAP to the hospital in case you may have to spend the night.   Notify your doctor if there is any change in your medical condition (cold, fever, infection).  Wear comfortable clothing (specific to your surgery type) to the hospital.  After surgery, you can help prevent lung complications by doing breathing exercises.  Take deep breaths and cough every 1-2 hours.   If you are being discharged the day of surgery, you will not be allowed to drive home. You will need a responsible individual to drive you home and stay with you for 24 hours after surgery.   If you are taking public transportation,  you will need to have a responsible individual with you.  Please call the Pre-admissions Testing Dept. at [phone] if you have any questions about these instructions.  Surgery Visitation Policy:  Patients having surgery or a procedure may have two visitors.  Children under the age of 34 must have an adult with them who is not the patient.

## 2023-09-26 NOTE — Progress Notes (Signed)
Perioperative / Anesthesia Services  Pre-Admission Testing Clinical Review / Preoperative Anesthesia Consult  Date: 09/27/23  Patient Demographics:  Name: Stephen Stein DOB:   06-24-1941 MRN:   [id number]  Planned Surgical Procedure(s):    Case: [id number] Date/Time: 09/28/23 1150   Procedure: CYSTOSCOPY WITH INSERTION OF UROLIFT   Anesthesia type: Monitor Anesthesia Care   Pre-op diagnosis: Benign Prostatic Hyperplasia with Urinary Obstruction   Location: [facility] OR ROOM 10 / [facility]   Surgeons: [name], MD     NOTE: Available PAT nursing documentation and vital signs have been reviewed. Clinical nursing staff has updated patient's PMH/PSHx, current medication list, and drug allergies/intolerances to ensure comprehensive history available to assist in medical decision making as it pertains to the aforementioned surgical procedure and anticipated anesthetic course. Extensive review of available clinical information personally performed. [place] PMH and PSHx updated with any diagnoses/procedures that  may have been inadvertently omitted during his intake with the pre-admission testing department's nursing staff.  Clinical Discussion:  Stephen Stein is a 82 y.o. male who is submitted for pre-surgical anesthesia review and clearance prior to him undergoing the above procedure. Patient is a Former Smoker (52.5 pack years; quit 05/1992). Pertinent PMH includes: CAD, atrial fibrillation, diastolic dysfunction, sinus bradycardia, second-degree AV block type I, NSVT, LBBB, BILATERAL carotid artery disease, aortic atherosclerosis, HTN, HLD, prediabetes, orthostatic hypotension, anemia, Parkinson's disease, OA, RLS, depression, mixed vascular/Alzheimer's dementia.  Patient is followed by cardiology Mariah Milling, MD). He was last seen in the cardiology clinic on 12/13/2022; notes reviewed. At the time of his clinic visit, patient doing well overall from a cardiovascular  perspective. Patient denied any chest pain, shortness of breath, PND, orthopnea, palpitations, significant peripheral edema, weakness, fatigue, or presyncope/syncope. Patient with a past medical history significant for cardiovascular diagnoses. Documented physical exam was grossly benign, providing no evidence of acute exacerbation and/or decompensation of the patient's known cardiovascular conditions.  Coronary CTA was performed on 12/16/2015 that demonstrated an Agatston coronary artery calcium score of 892. This placed patient in the 80th percentile for age, sex, and race matched controls. Calcium depositions noted to be noted in all three major coronary artery distributions. Ascending aorta normal at 3.2 cm.  Myocardial perfusion imaging study was performed on 10/17/2018 revealing a normal left ventricular systolic function with an EF of 64%.  There was a very small perfusion defect noted in the distal anteroseptal wall that improved on stress images.  Findings are likely secondary to conduction abnormality (bundle branch block).  There was no evidence of stress-induced arrhythmia or significant ECG changes.  Resting ECG did demonstrate a known LBBB.  Study determined to be low risk.  Long-term cardiac event monitor study performed on 12/10/2018 revealing a predominant underlying normal sinus rhythm at an average rate of 57 bpm.  There were 2 runs of NSVT noted with the fastest lasting 4 beats at a maximum rate of 164 bpm, and the longest lasting 13 beats at an average rate of 121 bpm.  There were no patient triggered events noted.  BILATERAL carotid Doppler study performed on 02/18/2019 revealing a <50% stenosis in the patient's BILATERAL internal carotid arteries.  Vertebral arteries patent with normal antegrade flow.  Exercise tolerance test was performed on 06/24/2021.  Following Bruce protocol, patient exercised for a duration of 4 minutes and 24 seconds.  Patient demonstrated a hypertensive  response to exercise.  Peak heart rate 109 bpm (MPHR 141 bpm; achieved 77%).  Maximum estimated workload 4.7  METS.  Patient remained asymptomatic throughout the study with no reports of angina.  Most recent TTE was performed on 07/15/2022 revealing a mildly reduced left ventricular systolic function with an EF of 40-45%.  There was mild LVH.  There were no regional wall motion abnormalities.  Left ventricular diastolic Doppler parameters indeterminant.  Right ventricular size and function normal.  Left atrium mildly dilated.  There was mild mitral valve regurgitation.  All transvalvular gradients were noted to be normal providing no evidence suggestive of valvular stenosis.  Aorta normal in size with no evidence of aneurysmal dilatation.  Patient with an atrial fibrillation diagnosis; CHA2DS2-VASc Score = 4 (age x 2, HTN, vascular disease history).Cardiac rate and rhythm currently being intrinsically maintained without pharmacological intervention.  Patient is on chronic daily oral anticoagulation therapy using apixaban.  Discussed cardioversion procedure, however patient declined.  He is reportedly compliant with therapy with no evidence or reports of GI bleeding.  Blood pressure well-controlled at 120/60 mmHg without any type of antihypertensive medications.  Patient is on rosuvastatin for his HLD diagnosis and ASCVD prevention.  He does have a prediabetes diagnosis.  At the time of his cardiology visit, his most recent A1c was 5.8% as checked on 10/21/2022.  Of note, since patient was last seen by cardiology, A1c has been rechecked with elevation to 6.1% on 04/21/2023.  Patient does not have an OSAH diagnosis. Patient is able to complete all of his  ADL/IADLs without cardiovascular limitation; requires assistance due to Parkinson's diagnosis with resulting gait instability, frequent falls, and vertiginous symptoms.  Per the [facility], patient is able to achieve at least 4 METS of physical activity without  experiencing any significant degree of angina/anginal equivalent symptoms no changes were made to his medication regimen.  Patient to follow-up with outpatient cardiology in 1 year or sooner if needed.  Stephen Stein is scheduled for an CYSTOSCOPY WITH INSERTION OF UROLIFT on 09/28/2023 with Dr. [name], MD, MD.  Given patient's past medical history significant for cardiovascular diagnoses, presurgical cardiac clearance was sought by the PAT team. Per cardiology, "patient's RCRI score is 0.9%. The patient affirms he has been doing well without any new cardiac symptoms. They are able to achieve 6 METS without cardiac limitations. Therefore, based on ACC/AHA guidelines, the patient would be at acceptable risk for the planned procedure without further cardiovascular testing".  Again, this patient is on chronic anticoagulation therapy using a DOAC medication. He has been instructed on recommendations for holding his apixaban for 2 days prior to his procedure with plans to restart as soon as postoperative bleeding risk felt to be minimized by his attending surgeon. The patient has been instructed that his last dose of his apixaban should be on 09/25/2023.  Patient denies previous perioperative complications with anesthesia in the past. In review of the available records, it is noted that patient underwent a general anesthetic course at here at [facility] ([facility]) in 06/2023 without documented complications.      09/26/2023    3:32 PM 09/19/2023   10:09 AM 09/18/2023    1:56 PM  Vitals with BMI  Height 5\' 10"  5\' 10"  5\' 10"   Weight 210 lbs 210 lbs 6 oz 212 lbs 2 oz  BMI 30.13 30.19 30.43  Systolic  145 122  Diastolic  74 84  Pulse  51 68    Providers/Specialists:   NOTE: Primary physician provider listed below. Patient may have been seen by APP or partner within  same practice.   PROVIDER ROLE / SPECIALTY LAST Jaquelyn Bitter, MD Urology (Surgeon)  06/12/2023  Danelle Berry, PA-C Primary Care Provider 09/18/2023  Julien Nordmann, MD Cardiology 12/13/2022; update call with preop APP 09/25/2023  Cristopher Peru, MD Neurology 07/12/2023   Allergies:  Patient has no known allergies.  Current Home Medications:   No current facility-administered medications for this encounter.    acetaminophen (TYLENOL) 500 MG tablet   Cholecalciferol (VITAMIN D3 PO)   docusate sodium (COLACE) 100 MG capsule   ELIQUIS 5 MG TABS tablet   famotidine (PEPCID) 20 MG tablet   meloxicam (MOBIC) 15 MG tablet   memantine (NAMENDA) 5 MG tablet   nitrofurantoin, macrocrystal-monohydrate, (MACROBID) 100 MG capsule   rOPINIRole (REQUIP) 1 MG tablet   rosuvastatin (CRESTOR) 10 MG tablet   sertraline (ZOLOFT) 100 MG tablet   History:   Past Medical History:  Diagnosis Date   Adrenal adenoma 05/09/2017   2 cm on scan May 2018; refer to endo   Anemia    Aortic atherosclerosis (HCC)    Arthritis    At high risk for falls    Atrial fibrillation (HCC)    a.) CHA2DS2VASc = 4 (age x 2, HTN, vascular disease history);  b.) rate/rhythm maintained without pharmacological intervention; chronically anticoagulated with apixaban   B12 deficiency    Biliary dyskinesia    a. 08/2018 s/p lap chole.   BPH (benign prostatic hyperplasia)    Carotid atherosclerosis, bilateral    a. 01/2019 Carotid U/S: <50% bilat ICA stenosis.   Childhood asthma    Constipation 10/21/2021   Coronary artery calcification seen on CT scan    a.) cCTA 12/16/2015: Ca2+ 892 (80th percentile for age/sex/race matched control). Diffuse Ca2+ in all 3 major coronary arteries; b.) 02/2016 MV: HTN response to exercise. Abnl ecg w/ horizontal inflat ST depression, but no ischemia/infarct on imaging; c.) 09/2018 MV: EF 64%, no ischemia. Very sm region of mild distal antsept defect, likely 2/2 LBBB   Depression    Diastolic dysfunction 10/09/2018   a.) TTE 10/09/2018: EF 45-50%, diff HK, mod LVH, mild reduced  RVSF, triv AR, G1DD; b.) TTE 07/15/2022: EF 45-50%. 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 Has been taking Atenolol for 20 years   LBBB (left bundle branch block)    Lightheadedness    Long term current use of anticoagulant    a.) apixaban   Mixed Alzheimer's and vascular dementia (HCC)    a.) on NMDA inhibitor (memantine)   Neuropathy    NSVT (nonsustained ventricular tachycardia) (HCC)    a. 11/2018 Zio monitor - 13 beats of NSVT--asymptomatic.   Orthostatic hypotension    a. 11/2018 pressures improved w/ midodrine/florinef.   Parkinson's disease (HCC)    Pre-diabetes    Pulmonary nodules    RLS (restless legs syndrome)    a.) on ropinirole   Sebaceous cyst of scrotum    Second degree AV block, Mobitz type I    Sinus bradycardia    a. 11/2018 Zio monitor: Avg HR 57 (min 28). Intermittent Mobitz I.   Wears dentures    Partial upper  Wears hearing aid in both ears    Past Surgical History:  Procedure Laterality Date   ANKLE FRACTURE SURGERY Left 09/28/2022   [facility]   CATARACT EXTRACTION W/PHACO Left 12/09/2020   Procedure: CATARACT EXTRACTION PHACO AND INTRAOCULAR LENS PLACEMENT (IOC) LEFT 6.94 01:09.7 9.9% ;  Surgeon: Lockie Mola, MD;  Location: [facility];  Service: Ophthalmology;  Laterality: Left;   CATARACT EXTRACTION W/PHACO Right 12/23/2020   Procedure: CATARACT EXTRACTION PHACO AND INTRAOCULAR LENS PLACEMENT (IOC) RIGHT MALYUGIN;  Surgeon: Lockie Mola, MD;  Location: [facility];  Service: Ophthalmology;  Laterality: Right;  8.44 1:14.6 11.3%   CHOLECYSTECTOMY N/A 09/13/2018   Procedure: LAPAROSCOPIC CHOLECYSTECTOMY;   Surgeon: Leafy Ro, MD;  Location: [facility] ORS;  Service: General;  Laterality: N/A;   COLONOSCOPY  12/26/2006   ESOPHAGOGASTRODUODENOSCOPY (EGD) WITH PROPOFOL N/A 08/20/2018   Procedure: ESOPHAGOGASTRODUODENOSCOPY (EGD) WITH PROPOFOL;  Surgeon: Midge Minium, MD;  Location: [facility];  Service: Endoscopy;  Laterality: N/A;   EXCISION MASS HEAD Right 03/31/2023   Procedure: EXCISION LESION AURICLE;  Surgeon: Vernie Murders, MD;  Location: [facility];  Service: ENT;  Laterality: Right;   SCROTAL EXPLORATION N/A 07/13/2023   Procedure: EXCISION OF SCROTUM MASS;  Surgeon: [name], MD;  Location: [facility] ORS;  Service: Urology;  Laterality: N/A;   TONSILLECTOMY AND ADENOIDECTOMY  12/26/1953   TOTAL SHOULDER ARTHROPLASTY Left 09/11/2019   Procedure: TOTAL SHOULDER ARTHROPLASTY;  Surgeon: Lyndle Herrlich, MD;  Location: [facility] ORS;  Service: Orthopedics;  Laterality: Left;   TOTAL SHOULDER ARTHROPLASTY Right 08/02/2021   Procedure: TOTAL SHOULDER ARTHROPLASTY;  Surgeon: Lyndle Herrlich, MD;  Location: [facility] ORS;  Service: Orthopedics;  Laterality: Right;   Family History  Problem Relation Age of Onset   Cancer Mother        lung   Heart disease Father    Hearing loss Sister        coclear implant   Hearing loss Brother    Cancer Paternal Grandmother    Cancer Son        colon cancer   Colon cancer Neg Hx    Stomach cancer Neg Hx    Social History   Tobacco Use   Smoking status: Former    Current packs/day: 0.00    Average packs/day: 1.5 packs/day for 35.0 years (52.5 ttl pk-yrs)    Types: Cigarettes    Start date: 06/22/1957    Quit date: 06/22/1992    Years since quitting: 31.2   Smokeless tobacco: Former    Quit date: 06/22/1992  Vaping Use   Vaping status: Never Used  Substance Use Topics   Alcohol use: Not Currently    Alcohol/week: 1.0 standard drink of alcohol    Types: 1 Cans of beer per week    Comment: rarely   Drug use: No    Pertinent Clinical Results:   LABS:  Lab Results  Component Value Date   WBC 8.6 09/27/2023   HGB 13.3 09/27/2023   HCT 39.2 09/27/2023   MCV 93.3 09/27/2023   PLT 245 09/27/2023   Lab Results  Component Value Date   NA 134 (L) 09/27/2023   K 4.5 09/27/2023   CO2 24 09/27/2023   GLUCOSE 102 (H) 09/27/2023   BUN 25 (H) 09/27/2023   CREATININE 1.21 09/27/2023   CALCIUM 8.9 09/27/2023   EGFR 71 04/21/2023   GFRNONAA >60 09/27/2023     ECG: Date: 09/27/2023 Time ECG obtained: 0942 AM Rate: 47 bpm Rhythm: atrial fibrillation  with slow ventricular response; LBBB Axis: Left axis deviation Intervals: QRS 166 ms. QTc 500 ms. ST segment and T wave changes: No evidence of acute ST segment elevation or depression Comparison: Similar to previous tracing obtained on 07/06/2023. Ventricular rate has decreased.    IMAGING / PROCEDURES: TRANSTHORACIC ECHOCARDIOGRAM performed on 07/15/2022 Left ventricular ejection fraction, by estimation, is 45 to 50%. The left ventricle has mildly decreased function. The left ventricle has no  regional wall motion abnormalities. There is mild left ventricular  hypertrophy. Left ventricular diastolic  parameters are indeterminate.  Right ventricular systolic function is normal. The right ventricular size is normal.  Left atrial size was mildly dilated.  The mitral valve is normal in structure. Mild mitral valve regurgitation. No evidence of mitral stenosis.  The aortic valve is normal in structure. Aortic valve regurgitation is mild. No aortic stenosis is present.  The inferior vena cava is normal in size with greater than 50% respiratory variability, suggesting right atrial pressure of 3 mmHg.   EXERCISE TOLERANCE TEST performed on 06/24/2021 The patient exercised following the Bruce protocol for 4 minutes and 24 seconds Resting HR 66 bpm; resting BP 114/67 mmHg Stress HR 109 bpm; stress BP 190/66 mmHg 77% MPHR achieved (MPHR 141 bpm) Maximum work load: 4.7 METS The patient  reported no symptoms during the stress test. The patient experienced no angina during the stress test.  The patient's response to exercise was adequate for diagnosis.   US CAROTID BILATERAL performed on 02/18/2019 Minor carotid intimal thickening and atherosclerosis.  No hemodynamically significant ICA stenosis. Degree of narrowing less than 50% bilaterally by ultrasound criteria. Patent antegrade vertebral flow bilaterally  MYOCARDIAL PERFUSION IMAGING STUDY (LEXISCAN) performed on 10/17/2018 Pharmacological myocardial perfusion imaging study with no significant ischemia Very small region of mild perfusion defect distal anteroseptal wall (improved on stress images compared to rest), likely secondary to conduction abnormality/bundle branch block Normal wall motion EF estimated at 64% No EKG changes concerning for ischemia at peak stress or in recovery. Resting EKG with left bundle branch block Low risk scan  CT CARDIAC SCORING performed on 12/16/2015 No significant non cardiac findings on limited lung and soft tissue windows.  Ascending Aorta:  3.2 cm Pericardium: Normal Coronary arteries: Diffuse calcium seen in all 3 major coronary arteries Coronary calcium score of 892. This was 80th percentile for age and sex matched controls Consider f/u stress testing or functional study  Impression and Plan:  Stephen Stein has been referred for pre-anesthesia review and clearance prior to him undergoing the planned anesthetic and procedural courses. Available labs, pertinent testing, and imaging results were personally reviewed by me in preparation for upcoming operative/procedural course. [facility] medical record has been updated following extensive record review and patient interview with PAT staff.   This patient has been appropriately cleared by cardiology with an overall ACCEPTABLE risk of experiencing significant perioperative cardiovascular complications. Based on clinical review performed  today (09/27/23), barring any significant acute changes in the patient's overall condition, it is anticipated that he will be able to proceed with the planned surgical intervention. Any acute changes in clinical condition may necessitate his procedure being postponed and/or cancelled. Patient will meet with anesthesia team (MD and/or CRNA) on the day of his procedure for preoperative evaluation/assessment. Questions regarding anesthetic course will be fielded at that time.   Pre-surgical instructions were reviewed with the patient during his PAT appointment, and questions were fielded to satisfaction by PAT clinical staff. He has been instructed on  which medications that he will need to hold prior to surgery, as well as the ones that have been deemed safe/appropriate to take on the day of his procedure. As part of the general education provided by PAT, patient made aware both verbally and in writing, that he would need to abstain from the use of any illegal substances during his perioperative course.  He was advised that failure to follow the provided instructions could necessitate case cancellation or result in serious perioperative complications up to and including death. Patient encouraged to contact PAT and/or his surgeon's office to discuss any questions or concerns that may arise prior to surgery; verbalized understanding.   Quentin Mulling, MSN, APRN, FNP-C, CEN [facility]  Peri-operative Services Nurse Practitioner Phone: [phone] Fax: [phone] 09/27/23 12:17 PM  NOTE: This note has been prepared using Dragon dictation software. Despite my best ability to proofread, there is always the potential that unintentional transcriptional errors may still occur from this process.

## 2023-09-27 ENCOUNTER — Encounter: Payer: Self-pay | Admitting: Urgent Care

## 2023-09-27 ENCOUNTER — Encounter
Admission: RE | Admit: 2023-09-27 | Discharge: 2023-09-27 | Disposition: A | Discharge status: Home / Self Care | Payer: PPO | Source: Ambulatory Visit | Attending: Urology | Admitting: Urology

## 2023-09-27 DIAGNOSIS — I10 Essential (primary) hypertension: Secondary | ICD-10-CM | POA: Insufficient documentation

## 2023-09-27 DIAGNOSIS — Z01818 Encounter for other preprocedural examination: Secondary | ICD-10-CM | POA: Insufficient documentation

## 2023-09-27 DIAGNOSIS — I447 Left bundle-branch block, unspecified: Secondary | ICD-10-CM | POA: Diagnosis not present

## 2023-09-27 DIAGNOSIS — I6523 Occlusion and stenosis of bilateral carotid arteries: Secondary | ICD-10-CM | POA: Insufficient documentation

## 2023-09-27 DIAGNOSIS — Z01810 Encounter for preprocedural cardiovascular examination: Secondary | ICD-10-CM | POA: Diagnosis not present

## 2023-09-27 DIAGNOSIS — Z01812 Encounter for preprocedural laboratory examination: Secondary | ICD-10-CM

## 2023-09-27 DIAGNOSIS — I25118 Atherosclerotic heart disease of native coronary artery with other forms of angina pectoris: Secondary | ICD-10-CM | POA: Insufficient documentation

## 2023-09-27 LAB — BASIC METABOLIC PANEL
Anion gap: 7 (ref 5–15)
BUN: 25 mg/dL — ABNORMAL HIGH (ref 8–23)
CO2: 24 mmol/L (ref 22–32)
Calcium: 8.9 mg/dL (ref 8.9–10.3)
Chloride: 103 mmol/L (ref 98–111)
Creatinine, Ser: 1.21 mg/dL (ref 0.61–1.24)
GFR, Estimated: >60 mL/min (ref >60)
Glucose, Bld: 102 mg/dL — ABNORMAL HIGH (ref 70–99)
Potassium: 4.5 mmol/L (ref 3.5–5.1)
Sodium: 134 mmol/L — ABNORMAL LOW (ref 135–145)

## 2023-09-27 LAB — CBC
HCT: 39.2 % (ref 39.0–52.0)
Hemoglobin: 13.3 g/dL (ref 13.0–17.0)
MCH: 31.7 pg (ref 26.0–34.0)
MCHC: 33.9 g/dL (ref 30.0–36.0)
MCV: 93.3 fL (ref 80.0–100.0)
Platelets: 245 10*3/uL (ref 150–400)
RBC: 4.2 MIL/uL — ABNORMAL LOW (ref 4.22–5.81)
RDW: 13.4 % (ref 11.5–15.5)
WBC: 8.6 10*3/uL (ref 4.0–10.5)
nRBC: 0 % (ref 0.0–0.2)

## 2023-09-28 ENCOUNTER — Encounter: Payer: Self-pay | Admitting: Urology

## 2023-09-28 ENCOUNTER — Other Ambulatory Visit: Payer: Self-pay

## 2023-09-28 ENCOUNTER — Ambulatory Visit: Payer: Self-pay | Admitting: Urgent Care

## 2023-09-28 ENCOUNTER — Ambulatory Visit
Admission: RE | Admit: 2023-09-28 | Discharge: 2023-09-28 | Disposition: A | Discharge status: Home / Self Care | Payer: PPO | Attending: Urology | Admitting: Urology

## 2023-09-28 ENCOUNTER — Encounter
Admission: RE | Disposition: A | Discharge status: Home / Self Care | Payer: Self-pay | Source: Home / Self Care | Attending: Urology

## 2023-09-28 ENCOUNTER — Ambulatory Visit: Payer: PPO | Admitting: Urgent Care

## 2023-09-28 DIAGNOSIS — N138 Other obstructive and reflux uropathy: Secondary | ICD-10-CM | POA: Insufficient documentation

## 2023-09-28 DIAGNOSIS — I441 Atrioventricular block, second degree: Secondary | ICD-10-CM | POA: Insufficient documentation

## 2023-09-28 DIAGNOSIS — Z79899 Other long term (current) drug therapy: Secondary | ICD-10-CM | POA: Insufficient documentation

## 2023-09-28 DIAGNOSIS — Z7901 Long term (current) use of anticoagulants: Secondary | ICD-10-CM | POA: Insufficient documentation

## 2023-09-28 DIAGNOSIS — I4891 Unspecified atrial fibrillation: Secondary | ICD-10-CM | POA: Insufficient documentation

## 2023-09-28 DIAGNOSIS — F32A Depression, unspecified: Secondary | ICD-10-CM | POA: Diagnosis not present

## 2023-09-28 DIAGNOSIS — I10 Essential (primary) hypertension: Secondary | ICD-10-CM | POA: Diagnosis not present

## 2023-09-28 DIAGNOSIS — G309 Alzheimer's disease, unspecified: Secondary | ICD-10-CM | POA: Insufficient documentation

## 2023-09-28 DIAGNOSIS — I2510 Atherosclerotic heart disease of native coronary artery without angina pectoris: Secondary | ICD-10-CM | POA: Diagnosis not present

## 2023-09-28 DIAGNOSIS — R3915 Urgency of urination: Secondary | ICD-10-CM | POA: Diagnosis not present

## 2023-09-28 DIAGNOSIS — R3912 Poor urinary stream: Secondary | ICD-10-CM | POA: Insufficient documentation

## 2023-09-28 DIAGNOSIS — I447 Left bundle-branch block, unspecified: Secondary | ICD-10-CM | POA: Insufficient documentation

## 2023-09-28 DIAGNOSIS — Z87891 Personal history of nicotine dependence: Secondary | ICD-10-CM | POA: Insufficient documentation

## 2023-09-28 DIAGNOSIS — E785 Hyperlipidemia, unspecified: Secondary | ICD-10-CM | POA: Insufficient documentation

## 2023-09-28 DIAGNOSIS — G20A1 Parkinson's disease without dyskinesia, without mention of fluctuations: Secondary | ICD-10-CM | POA: Insufficient documentation

## 2023-09-28 DIAGNOSIS — N401 Enlarged prostate with lower urinary tract symptoms: Secondary | ICD-10-CM | POA: Insufficient documentation

## 2023-09-28 DIAGNOSIS — I739 Peripheral vascular disease, unspecified: Secondary | ICD-10-CM | POA: Diagnosis not present

## 2023-09-28 DIAGNOSIS — N3289 Other specified disorders of bladder: Secondary | ICD-10-CM | POA: Diagnosis not present

## 2023-09-28 DIAGNOSIS — R7303 Prediabetes: Secondary | ICD-10-CM | POA: Insufficient documentation

## 2023-09-28 DIAGNOSIS — F0280 Dementia in other diseases classified elsewhere without behavioral disturbance: Secondary | ICD-10-CM | POA: Diagnosis not present

## 2023-09-28 HISTORY — DX: Unspecified asthma, uncomplicated: J45.909

## 2023-09-28 HISTORY — PX: CYSTOSCOPY WITH INSERTION OF UROLIFT: SHX6678

## 2023-09-28 SURGERY — CYSTOSCOPY WITH INSERTION OF UROLIFT
Anesthesia: General

## 2023-09-28 MED ORDER — ONDANSETRON HCL 4 MG/2ML IJ SOLN
INTRAMUSCULAR | Status: AC
  Filled 2023-09-28: qty 2

## 2023-09-28 MED ORDER — STERILE WATER FOR IRRIGATION IR SOLN
Status: DC | PRN
Start: 2023-09-28 — End: 2023-09-28
  Administered 2023-09-28: 1

## 2023-09-28 MED ORDER — FENTANYL CITRATE (PF) 100 MCG/2ML IJ SOLN: 25.0000 ug | INTRAMUSCULAR | Status: DC | PRN

## 2023-09-28 MED ORDER — LACTATED RINGERS IV SOLN: INTRAVENOUS | Status: DC

## 2023-09-28 MED ORDER — FENTANYL CITRATE (PF) 100 MCG/2ML IJ SOLN
INTRAMUSCULAR | Status: DC | PRN
  Administered 2023-09-28 (×2): 25 ug via INTRAVENOUS

## 2023-09-28 MED ORDER — ORAL CARE MOUTH RINSE: 15.0000 mL | Freq: Once | OROMUCOSAL | Status: AC

## 2023-09-28 MED ORDER — CEFAZOLIN SODIUM-DEXTROSE 2-4 GM/100ML-% IV SOLN
2.0000 g | INTRAVENOUS | Status: AC
  Administered 2023-09-28: 2 g via INTRAVENOUS

## 2023-09-28 MED ORDER — CHLORHEXIDINE GLUCONATE 0.12 % MT SOLN
15.0000 mL | Freq: Once | OROMUCOSAL | Status: AC
  Administered 2023-09-28: 15 mL via OROMUCOSAL

## 2023-09-28 MED ORDER — DEXAMETHASONE SODIUM PHOSPHATE 10 MG/ML IJ SOLN
INTRAMUSCULAR | Status: AC
  Filled 2023-09-28: qty 1

## 2023-09-28 MED ORDER — FENTANYL CITRATE (PF) 100 MCG/2ML IJ SOLN
INTRAMUSCULAR | Status: AC
  Filled 2023-09-28: qty 2

## 2023-09-28 MED ORDER — OXYCODONE HCL 5 MG/5ML PO SOLN: 5.0000 mg | Freq: Once | ORAL | Status: AC | PRN

## 2023-09-28 MED ORDER — LIDOCAINE HCL (PF) 2 % IJ SOLN
INTRAMUSCULAR | Status: AC
  Filled 2023-09-28: qty 5

## 2023-09-28 MED ORDER — PROPOFOL 1000 MG/100ML IV EMUL
INTRAVENOUS | Status: AC
  Filled 2023-09-28: qty 100

## 2023-09-28 MED ORDER — ONDANSETRON HCL 4 MG/2ML IJ SOLN: 4.0000 mg | Freq: Once | INTRAMUSCULAR | Status: DC | PRN

## 2023-09-28 MED ORDER — OXYCODONE HCL 5 MG PO TABS
ORAL_TABLET | ORAL | Status: AC
  Filled 2023-09-28: qty 1

## 2023-09-28 MED ORDER — GLYCOPYRROLATE 0.2 MG/ML IJ SOLN
INTRAMUSCULAR | Status: DC | PRN
Start: 2023-09-28 — End: 2023-09-28
  Administered 2023-09-28: .2 mg via INTRAVENOUS

## 2023-09-28 MED ORDER — PHENYLEPHRINE 80 MCG/ML (10ML) SYRINGE FOR IV PUSH (FOR BLOOD PRESSURE SUPPORT)
PREFILLED_SYRINGE | INTRAVENOUS | Status: AC
  Filled 2023-09-28: qty 10

## 2023-09-28 MED ORDER — OXYCODONE HCL 5 MG PO TABS
5.0000 mg | ORAL_TABLET | Freq: Once | ORAL | Status: AC | PRN
  Administered 2023-09-28: 5 mg via ORAL

## 2023-09-28 MED ORDER — FENTANYL CITRATE (PF) 100 MCG/2ML IJ SOLN: INTRAMUSCULAR | Status: DC | PRN

## 2023-09-28 MED ORDER — PROPOFOL 500 MG/50ML IV EMUL
INTRAVENOUS | Status: DC | PRN
  Administered 2023-09-28: 50 mg via INTRAVENOUS
  Administered 2023-09-28: 150 ug/kg/min via INTRAVENOUS
  Administered 2023-09-28: 50 mg via INTRAVENOUS

## 2023-09-28 MED ORDER — ONDANSETRON HCL 4 MG/2ML IJ SOLN
INTRAMUSCULAR | Status: DC | PRN
  Administered 2023-09-28: 4 mg via INTRAVENOUS

## 2023-09-28 MED ORDER — LIDOCAINE HCL (CARDIAC) PF 100 MG/5ML IV SOSY
PREFILLED_SYRINGE | INTRAVENOUS | Status: DC | PRN
  Administered 2023-09-28: 100 mg via INTRAVENOUS

## 2023-09-28 MED ORDER — ACETAMINOPHEN 10 MG/ML IV SOLN: 1000.0000 mg | Freq: Once | INTRAVENOUS | Status: DC | PRN

## 2023-09-28 MED ORDER — CEFAZOLIN SODIUM-DEXTROSE 2-4 GM/100ML-% IV SOLN
INTRAVENOUS | Status: AC
  Filled 2023-09-28: qty 100

## 2023-09-28 MED ORDER — EPHEDRINE SULFATE-NACL 50-0.9 MG/10ML-% IV SOSY
PREFILLED_SYRINGE | INTRAVENOUS | Status: DC | PRN
  Administered 2023-09-28: 5 mg via INTRAVENOUS

## 2023-09-28 MED ORDER — CHLORHEXIDINE GLUCONATE 0.12 % MT SOLN
OROMUCOSAL | Status: AC
  Filled 2023-09-28: qty 15

## 2023-09-28 MED ORDER — HYDROCODONE-ACETAMINOPHEN 5-325 MG PO TABS: 1.0000 | ORAL_TABLET | Freq: Four times a day (QID) | ORAL | 0 refills | Status: DC | PRN

## 2023-09-28 SURGICAL SUPPLY — 14 items
BAG DRAIN SIEMENS DORNER NS (MISCELLANEOUS) ×2 IMPLANT
BAG DRN NS LF (MISCELLANEOUS) ×1
GLOVE BIO SURGEON STRL SZ 6.5 (GLOVE) ×2 IMPLANT
GOWN STRL REUS W/ TWL LRG LVL3 (GOWN DISPOSABLE) ×4 IMPLANT
GOWN STRL REUS W/TWL LRG LVL3 (GOWN DISPOSABLE) ×2
KIT TURNOVER CYSTO (KITS) ×2 IMPLANT
PACK CYSTO AR (MISCELLANEOUS) ×2 IMPLANT
SET CYSTO W/LG BORE CLAMP LF (SET/KITS/TRAYS/PACK) ×2 IMPLANT
SURGILUBE 2OZ TUBE FLIPTOP (MISCELLANEOUS) IMPLANT
SYSTEM UROLIFT 2 CART W/ HNDL (Male Continence) ×2 IMPLANT
SYSTEM UROLIFT 2 CARTRIDGE (Male Continence) IMPLANT
WATER STERILE IRR 1000ML POUR (IV SOLUTION) ×2 IMPLANT
WATER STERILE IRR 3000ML UROMA (IV SOLUTION) ×2 IMPLANT
WATER STERILE IRR 500ML POUR (IV SOLUTION) ×2 IMPLANT

## 2023-09-28 NOTE — Anesthesia Preprocedure Evaluation (Addendum)
Anesthesia Evaluation  Patient identified by MRN, date of birth, ID band Patient awake    Reviewed: Allergy & Precautions, NPO status , Patient's Chart, lab work & pertinent test results  History of Anesthesia Complications Negative for: history of anesthetic complications  Airway Mallampati: IV       Dental  (+) Missing, Partial Upper   Pulmonary former smoker (quit 1993)   Pulmonary exam normal breath sounds clear to auscultation       Cardiovascular hypertension, + CAD and + Peripheral Vascular Disease (carotid stenosis)  + dysrhythmias (a fib on Eliquis)  Rhythm:Irregular Rate:Normal  ECG 09/27/23:  Atrial fibrillation with slow ventricular response Left axis deviation Left bundle branch block  Echo 07/15/22:  1. Left ventricular ejection fraction, by estimation, is 45 to 50%. The left ventricle has mildly decreased function. The left ventricle has no  regional wall motion abnormalities. There is mild left ventricular  hypertrophy. Left ventricular diastolic  parameters are indeterminate.  2. Right ventricular systolic function is normal. The right ventricular size is normal.  3. Left atrial size was mildly dilated.  4. The mitral valve is normal in structure. Mild mitral valve regurgitation. No evidence of mitral stenosis.  5. The aortic valve is normal in structure. Aortic valve regurgitation is mild. No aortic stenosis is present.  6. The inferior vena cava is normal in size with greater than 50% respiratory variability, suggesting right atrial pressure of 3 mmHg.  Exercise tolerance test 06/24/21:  1. The patient exercised following the Bruce protocol for 4 minutes and 24 seconds 2. Resting HR 66 bpm; resting BP 114/67 mmHg 3. Stress HR 109 bpm; stress BP 190/66 mmHg 4. 77% MPHR achieved (MPHR 141 bpm) 5. Maximum work load: 4.7 METS 6. The patient reported no symptoms during the stress test. The patient experienced no  angina during the stress test.  7. The patient's response to exercise was adequate for diagnosis.  Myocardial perfusion 10/17/18:  Pharmacological myocardial perfusion imaging study with no significant  Ischemia Very small region of mild perfusion defect distal anteroseptal wall (improved on stress images compared to rest), likely secondary to conduction abnormality/bundle branch block Normal wall motion, EF estimated at 64% No EKG changes concerning for ischemia at peak stress or in recovery. Resting EKG with left bundle branch block Low risk scan   Neuro/Psych  PSYCHIATRIC DISORDERS  Depression   Dementia HOH  Neuromuscular disease (Parkinson disease)    GI/Hepatic negative GI ROS,,,  Endo/Other  Obesity; prediabetes   Renal/GU negative Renal ROS     Musculoskeletal  (+) Arthritis ,    Abdominal   Peds  Hematology  (+) Blood dyscrasia, anemia   Anesthesia Other Findings Reviewed and agree with Edd Fabian pre-anesthesia clinical review note.    Cardiology note 09/25/23:  1.  Preoperative Cardiovascular Risk Assessment: -Patient's RCRI score is 0.9%   The patient affirms he has been doing well without any new cardiac symptoms. They are able to achieve 6 METS without cardiac limitations. Therefore, based on ACC/AHA guidelines, the patient would be at acceptable risk for the planned procedure without further cardiovascular testing. The patient was advised that if he develops new symptoms prior to surgery to contact our office to arrange for a follow-up visit, and he verbalized understanding.    The patient was advised that if he develops new symptoms prior to surgery to contact our office to arrange for a follow-up visit, and he verbalized understanding.   Patient can hold Eliquis 2 days prior  to procedure   Reproductive/Obstetrics                             Anesthesia Physical Anesthesia Plan  ASA: 3  Anesthesia Plan: General   Post-op  Pain Management:    Induction: Intravenous  PONV Risk Score and Plan: 2 and Ondansetron, Dexamethasone and Treatment may vary due to age or medical condition  Airway Management Planned: LMA  Additional Equipment:   Intra-op Plan:   Post-operative Plan: Extubation in OR  Informed Consent: I have reviewed the patients History and Physical, chart, labs and discussed the procedure including the risks, benefits and alternatives for the proposed anesthesia with the patient or authorized representative who has indicated his/her understanding and acceptance.     Dental advisory given  Plan Discussed with: CRNA  Anesthesia Plan Comments: (Patient consented for risks of anesthesia including but not limited to:  - adverse reactions to medications - damage to eyes, teeth, lips or other oral mucosa - nerve damage due to positioning  - sore throat or hoarseness - damage to heart, brain, nerves, lungs, other parts of body or loss of life  Informed patient about role of CRNA in peri- and intra-operative care.  Patient voiced understanding.)        Anesthesia Quick Evaluation

## 2023-09-28 NOTE — Interval H&P Note (Signed)
History and Physical Interval Note:  09/28/2023 10:37 AM  Stephen Stein  has presented today for surgery, with the diagnosis of Benign Prostatic Hyperplasia with Urinary Obstruction.  The various methods of treatment have been discussed with the patient and family. After consideration of risks, benefits and other options for treatment, the patient has consented to  Procedure(s): CYSTOSCOPY WITH INSERTION OF UROLIFT (N/A) as a surgical intervention.  The patient's history has been reviewed, patient examined, no change in status, stable for surgery.  I have reviewed the patient's chart and labs.  Questions were answered to the patient's satisfaction.    RRR CTAB  [name]

## 2023-09-28 NOTE — Discharge Instructions (Addendum)
AMBULATORY SURGERY  DISCHARGE INSTRUCTIONS   The drugs that you were given will stay in your system until tomorrow so for the next 24 hours you should not:  Drive an automobile Make any legal decisions Drink any alcoholic beverage   You may resume regular meals tomorrow.  Today it is better to start with liquids and gradually work up to solid foods.  You may eat anything you prefer, but it is better to start with liquids, then soup and crackers, and gradually work up to solid foods.   Please notify your doctor immediately if you have any unusual bleeding, trouble breathing, redness and pain at the surgery site, drainage, fever, or pain not relieved by medication.    Additional Instructions:    Please contact your physician with any problems or Same Day Surgery at [phone], Monday through Friday 6 am to 4 pm, or [place] at [facility] number at [phone].    Urolift Post-Operative Instructions     Patient Expectations   1. Mild blood in your urine for about 1 week.  2. Urinary buring, frequency, and urgency for 10 days.  3. Mild pelvic pain 1-2 weeks.     Return to Activity     1. Drink water post procedure.  2. Take meds as needed.  Tylenol and/or Motrin is most helpful.  You may also by Pyridium/Azo over-the-counter for urinary burning.  3. No lifting or straining 48hrs.  4. Other activity when they feel up to it.

## 2023-09-28 NOTE — Transfer of Care (Signed)
Immediate Anesthesia Transfer of Care Note  Patient: Stephen Stein  Procedure(s) Performed: CYSTOSCOPY WITH INSERTION OF UROLIFT (6)  Patient Location: PACU  Anesthesia Type:MAC  Level of Consciousness: drowsy  Airway & Oxygen Therapy: Patient Spontanous Breathing and Patient connected to face mask oxygen  Post-op Assessment: Report given to RN and Post -op Vital signs reviewed and stable  Post vital signs: Reviewed and stable  Last Vitals:  Vitals Value Taken Time  BP 107/60 09/28/23 1125  Temp 97.2   Pulse 66 09/28/23 1129  Resp 19 09/28/23 1129  SpO2 96 % 09/28/23 1129    Last Pain:  Vitals:   09/28/23 0939  TempSrc: Oral      Patients Stated Pain Goal: 0 (09/28/23 0939)  Complications: There were no known notable events for this encounter.

## 2023-09-28 NOTE — Anesthesia Postprocedure Evaluation (Signed)
Anesthesia Post Note  Patient: Stephen Stein  Procedure(s) Performed: CYSTOSCOPY WITH INSERTION OF UROLIFT (6)  Patient location during evaluation: PACU Anesthesia Type: General Level of consciousness: awake and alert, oriented and patient cooperative Pain management: pain level controlled Vital Signs Assessment: post-procedure vital signs reviewed and stable Respiratory status: spontaneous breathing, nonlabored ventilation and respiratory function stable Cardiovascular status: blood pressure returned to baseline and stable Postop Assessment: adequate PO intake Anesthetic complications: no   There were no known notable events for this encounter.   Last Vitals:  Vitals:   09/28/23 1216 09/28/23 1230  BP: (!) 162/70 128/75  Pulse: 64   Resp: 15   Temp:    SpO2: 100%     Last Pain:  Vitals:   09/28/23 1216  TempSrc:   PainSc: 4                  Reed Breech

## 2023-09-28 NOTE — Op Note (Signed)
Preoperative diagnosis: BPH with obstructive symptomatology   Postoperative diagnosis: BPH with obstructive symptomatology   Principal procedure: Urolift procedure, with the placement of 6 implants.   Surgeon: [name]   Anesthesia: MAC   Complications: None   Drains: None   Estimated blood loss: < 5 mL   Indications: 82 year-old male with obstructive symptomatology secondary to BPH.  The patient's symptoms have progressed, and he has requested further management.  Management options including TURP with resection/ablation of the prostate as well as Urolift were discussed.  The patient has chosen to have a Urolift procedure.  He has been instructed to the procedure as well as risks and complications which include but are not limited to infection, bleeding, and inadequate treatment with the Urolift procedure alone, anesthetic complications, among others.  He understands these and desires to proceed.   Findings: Using the 17 French cystoscope, urethra and bladder were inspected.  There were no urethral lesions.  Prostatic urethra was obstructed secondary to bilobar hypertrophy.  The bladder was inspected circumferentially.  This revealed normal findings.   Description of procedure: The patient was properly identified in the holding area.  He received preoperative IV antibiotics.  He was taken to the operating room where MAC.  He is placed in the dorsolithotomy position.  Genitalia and perineum were prepped and draped.  Proper timeout was performed.   A 7F cystoscope was inserted into the bladder with findings as described above.  The 1st pair of implants were placed at the bladder neck ~1.5 cm from the bladder Neck. The 2nd pair of implants were placed at the level of the verumontanum.   A repeat cysto was performed and another pair of implants in between the prior pairs.    A final cystoscopy was conducted first to inspect the location and state of each implant and second, to confirm  the presence of a continuous anterior channel was present through the prostatic urethra with irrigation flow turned off.    6 implants were delivered in total.    Following this, the scope was removed.  After anesthetic reversal he was transported to the PACU in stable condition.  He tolerated the procedure well.   Plan: He will follow-up with me in 4 to 6 weeks for IPSS/PVR.

## 2023-09-29 ENCOUNTER — Encounter: Payer: Self-pay | Admitting: Urology

## 2023-10-04 ENCOUNTER — Encounter: Payer: Self-pay | Admitting: Nurse Practitioner

## 2023-10-05 ENCOUNTER — Other Ambulatory Visit: Payer: Self-pay | Admitting: Nurse Practitioner

## 2023-10-05 DIAGNOSIS — R21 Rash and other nonspecific skin eruption: Secondary | ICD-10-CM

## 2023-10-05 MED ORDER — PREDNISONE 10 MG (21) PO TBPK
ORAL_TABLET | ORAL | 0 refills | Status: DC
Start: 2023-10-05 — End: 2023-11-21

## 2023-10-08 ENCOUNTER — Encounter: Payer: Self-pay | Admitting: Nurse Practitioner

## 2023-10-10 ENCOUNTER — Other Ambulatory Visit: Payer: Self-pay | Admitting: Nurse Practitioner

## 2023-10-10 DIAGNOSIS — L509 Urticaria, unspecified: Secondary | ICD-10-CM

## 2023-10-10 NOTE — Telephone Encounter (Signed)
Requested medication (s) are due for refill today: Yes  Requested medication (s) are on the active medication list: Yes  Last refill:  09/18/23  Future visit scheduled:   Notes to clinic:  Pharmacy requests 90 day supply and diagnosis code.    Requested Prescriptions  Pending Prescriptions Disp Refills   famotidine (PEPCID) 20 MG tablet [Pharmacy Med Name: FAMOTIDINE 20 MG TABLET] 180 tablet 1    Sig: TAKE 1 TABLET BY MOUTH TWICE A DAY     Gastroenterology:  H2 Antagonists Passed - 10/10/2023  8:30 AM      Passed - Valid encounter within last 12 months    Recent Outpatient Visits           3 weeks ago Hives   [facility] Berniece Salines, FNP   5 months ago Essential hypertension   [facility] Danelle Berry, PA-C   7 months ago Cellulitis of right ear   [facility] Danelle Berry, PA-C   10 months ago Encounter for [employer] annual wellness exam   [facility] Mecum, Oswaldo Conroy, PA-C   10 months ago Cellulitis and abscess of right leg   [facility] Danelle Berry, [place]       Future Appointments             In 3 weeks Deirdre Evener, MD [facility]   In 1 month [name], MD [facility] [place]   In 2 months  [facility], [facility]

## 2023-10-18 ENCOUNTER — Ambulatory Visit: Payer: PPO | Admitting: Urology

## 2023-10-21 DIAGNOSIS — G4733 Obstructive sleep apnea (adult) (pediatric): Secondary | ICD-10-CM | POA: Diagnosis not present

## 2023-11-01 ENCOUNTER — Ambulatory Visit: Payer: PPO | Admitting: Dermatology

## 2023-11-01 DIAGNOSIS — L720 Epidermal cyst: Secondary | ICD-10-CM | POA: Diagnosis not present

## 2023-11-01 DIAGNOSIS — L578 Other skin changes due to chronic exposure to nonionizing radiation: Secondary | ICD-10-CM | POA: Diagnosis not present

## 2023-11-01 DIAGNOSIS — Z85828 Personal history of other malignant neoplasm of skin: Secondary | ICD-10-CM

## 2023-11-01 DIAGNOSIS — Z7189 Other specified counseling: Secondary | ICD-10-CM

## 2023-11-01 DIAGNOSIS — D1801 Hemangioma of skin and subcutaneous tissue: Secondary | ICD-10-CM | POA: Diagnosis not present

## 2023-11-01 DIAGNOSIS — L821 Other seborrheic keratosis: Secondary | ICD-10-CM

## 2023-11-01 DIAGNOSIS — L814 Other melanin hyperpigmentation: Secondary | ICD-10-CM | POA: Diagnosis not present

## 2023-11-01 DIAGNOSIS — L905 Scar conditions and fibrosis of skin: Secondary | ICD-10-CM | POA: Diagnosis not present

## 2023-11-01 DIAGNOSIS — L570 Actinic keratosis: Secondary | ICD-10-CM | POA: Diagnosis not present

## 2023-11-01 DIAGNOSIS — W908XXA Exposure to other nonionizing radiation, initial encounter: Secondary | ICD-10-CM | POA: Diagnosis not present

## 2023-11-01 DIAGNOSIS — L738 Other specified follicular disorders: Secondary | ICD-10-CM

## 2023-11-01 DIAGNOSIS — Z1283 Encounter for screening for malignant neoplasm of skin: Secondary | ICD-10-CM | POA: Diagnosis not present

## 2023-11-01 DIAGNOSIS — I878 Other specified disorders of veins: Secondary | ICD-10-CM | POA: Diagnosis not present

## 2023-11-01 DIAGNOSIS — B356 Tinea cruris: Secondary | ICD-10-CM | POA: Diagnosis not present

## 2023-11-01 DIAGNOSIS — Z79899 Other long term (current) drug therapy: Secondary | ICD-10-CM

## 2023-11-01 DIAGNOSIS — L304 Erythema intertrigo: Secondary | ICD-10-CM

## 2023-11-01 DIAGNOSIS — L729 Follicular cyst of the skin and subcutaneous tissue, unspecified: Secondary | ICD-10-CM

## 2023-11-01 DIAGNOSIS — D229 Melanocytic nevi, unspecified: Secondary | ICD-10-CM

## 2023-11-01 MED ORDER — KETOCONAZOLE 2 % EX CREA
TOPICAL_CREAM | CUTANEOUS | 5 refills | Status: AC
Start: 2023-11-01 — End: ?

## 2023-11-01 NOTE — Progress Notes (Signed)
Follow-Up Visit   Subjective  Stephen Stein is a 82 y.o. male who presents for the following: Skin Cancer Screening and Full Body Skin Exam Hx bcc  Reports history of jock itch that comes and goes    The patient presents for Total-Body Skin Exam (TBSE) for skin cancer screening and mole check. The patient has spots, moles and lesions to be evaluated, some may be new or changing and the patient may have concern these could be cancer.    The following portions of the chart were reviewed this encounter and updated as appropriate: medications, allergies, medical history  Review of Systems:  No other skin or systemic complaints except as noted in HPI or Assessment and Plan.  Objective  Well appearing patient in no apparent distress; mood and affect are within normal limits.  A full examination was performed including scalp, head, eyes, ears, nose, lips, neck, chest, axillae, abdomen, back, buttocks, bilateral upper extremities, bilateral lower extremities, hands, feet, fingers, toes, fingernails, and toenails. All findings within normal limits unless otherwise noted below.   Relevant physical exam findings are noted in the Assessment and Plan.  left nasal bridge x 1 Erythematous thin papules/macules with gritty scale.     Assessment & Plan   SKIN CANCER SCREENING PERFORMED TODAY.  INTERTRIGO with Tinea Cruris Exam Mild redness and bruising from scratching at groin   Intertrigo is a chronic recurrent rash that occurs in skin fold areas that may be associated with friction; heat; moisture; yeast; fungus; and bacteria.  It is exacerbated by increased movement / activity; sweating; and higher atmospheric temperature.  Treatment Plan  Start ketoconzole 2 % cream use once or twice daily in groin prn.  5 rfs   Patient instructed to send mychart message if not improving in 6 - 8 weeks    Sebaceous Hyperplasia At face - Small yellow papules with a central dell -  Benign-appearing - Observe. Call for changes.  [place] Exam: red or purple papule at vermilion lip  Treatment Plan: Benign-appearing. Observe   MILIA Exam: tiny erythematous firm white papule  Discussed this is a type of cyst. Benign-appearing. Sometimes these will clear with OTC adapalene/Differin 0.1% cream QHS or retinol.  Discussed extraction if symptomatic.   ACTINIC DAMAGE - Chronic condition, secondary to cumulative UV/sun exposure - diffuse scaly erythematous macules with underlying dyspigmentation - Recommend daily broad spectrum sunscreen SPF 30+ to sun-exposed areas, reapply every 2 hours as needed.  - Staying in the shade or wearing long sleeves, sun glasses (UVA+UVB protection) and wide brim hats (4-inch brim around the entire circumference of the hat) are also recommended for sun protection.  - Call for new or changing lesions.   History of old acne scars at back Exam: scarring at back Treatment Plan: Benign. Observe    LENTIGINES, SEBORRHEIC KERATOSES, HEMANGIOMAS - Benign normal skin lesions - Benign-appearing - Call for any changes  MELANOCYTIC NEVI - Tan-brown and/or pink-flesh-colored symmetric macules and papules - Benign appearing on exam today - Observation - Call clinic for new or changing moles - Recommend daily use of broad spectrum spf 30+ sunscreen to sun-exposed areas.    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  - Call if any new or changing lesions are noted between office visits  Tinea cruris  Related Medications ketoconazole (NIZORAL) 2 % cream Apply topically to affected areas qd/bid prn  Actinic keratosis left nasal bridge x 1  Actinic keratoses are precancerous spots that appear secondary to cumulative UV  radiation exposure/sun exposure over time. They are chronic with expected duration over 1 year. A portion of actinic keratoses will progress to squamous cell carcinoma of the skin. It is not possible to reliably predict which spots will progress to skin cancer and so treatment is recommended to prevent development of skin cancer.  Recommend daily broad spectrum sunscreen SPF 30+ to sun-exposed areas, reapply every 2 hours as needed.  Recommend staying in the shade or wearing long sleeves, sun glasses (UVA+UVB protection) and wide brim hats (4-inch brim around the entire circumference of the hat). Call for new or changing lesions.  Destruction of lesion - left nasal bridge x 1 Complexity: simple   Destruction method: cryotherapy   Informed consent: discussed and consent obtained   Timeout:  patient name, date of birth, surgical site, and procedure verified Lesion destroyed using liquid nitrogen: Yes   Region frozen until ice ball extended beyond lesion: Yes   Outcome: patient tolerated procedure well with no complications   Post-procedure details: wound care instructions given     Return in about 1 year (around 10/31/2024) for TBSE.  IAsher Muir, CMA, am acting as scribe for Armida Sans, MD.   Documentation: I have reviewed the above documentation for accuracy and completeness, and I agree with the above.  Armida Sans, MD

## 2023-11-01 NOTE — Patient Instructions (Addendum)
Contact us through mychart in 6  - 8 weeks about itchy rash at groin if not improving or if you are doing better.     Seborrheic Keratosis  What causes seborrheic keratoses? Seborrheic keratoses are harmless, common skin growths that first appear during adult life.  As time goes by, more growths appear.  Some people may develop a large number of them.  Seborrheic keratoses appear on both covered and uncovered body parts.  They are not caused by sunlight.  The tendency to develop seborrheic keratoses can be inherited.  They vary in color from skin-colored to gray, brown, or even black.  They can be either smooth or have a rough, warty surface.   Seborrheic keratoses are superficial and look as if they were stuck on the skin.  Under the microscope this type of keratosis looks like layers upon layers of skin.  That is why at times the top layer may seem to fall off, but the rest of the growth remains and re-grows.    Treatment Seborrheic keratoses do not need to be treated, but can easily be removed in the office.  Seborrheic keratoses often cause symptoms when they rub on clothing or jewelry.  Lesions can be in the way of shaving.  If they become inflamed, they can cause itching, soreness, or burning.  Removal of a seborrheic keratosis can be accomplished by freezing, burning, or surgery. If any spot bleeds, scabs, or grows rapidly, please return to have it checked, as these can be an indication of a skin cancer.   Cryotherapy Aftercare  Wash gently with soap and water everyday.   Apply Vaseline and Band-Aid daily until healed.     Intertrigo is a chronic recurrent rash that occurs in skin fold areas that may be associated with friction; heat; moisture; yeast; fungus; and bacteria.  It is exacerbated by increased movement / activity; sweating; and higher atmospheric temperature.  Recommend OTC Zeasorb AF powder to body folds daily after shower.  It is often found in the athlete's foot section  in the pharmacy.  Avoid using powders that contain cornstarch.     Melanoma ABCDEs  Melanoma is the most dangerous type of skin cancer, and is the leading cause of death from skin disease.  You are more likely to develop melanoma if you: Have light-colored skin, light-colored eyes, or red or blond hair Spend a lot of time in the sun Tan regularly, either outdoors or in a tanning bed Have had blistering sunburns, especially during childhood Have a close family member who has had a melanoma Have atypical moles or large birthmarks  Early detection of melanoma is key since treatment is typically straightforward and cure rates are extremely high if we catch it early.   The first sign of melanoma is often a change in a mole or a new dark spot.  The ABCDE system is a way of remembering the signs of melanoma.  A for asymmetry:  The two halves do not match. B for border:  The edges of the growth are irregular. C for color:  A mixture of colors are present instead of an even brown color. D for diameter:  Melanomas are usually (but not always) greater than 6mm - the size of a pencil eraser. E for evolution:  The spot keeps changing in size, shape, and color.  Please check your skin once per month between visits. You can use a small mirror in front and a large mirror behind you to keep an  eye on the back side or your body.   If you see any new or changing lesions before your next follow-up, please call to schedule a visit.  Please continue daily skin protection including broad spectrum sunscreen SPF 30+ to sun-exposed areas, reapplying every 2 hours as needed when you're outdoors.   Staying in the shade or wearing long sleeves, sun glasses (UVA+UVB protection) and wide brim hats (4-inch brim around the entire circumference of the hat) are also recommended for sun protection.    Due to recent changes in healthcare laws, you may see results of your pathology and/or laboratory studies on MyChart  before the doctors have had a chance to review them. We understand that in some cases there may be results that are confusing or concerning to you. Please understand that not all results are received at the same time and often the doctors may need to interpret multiple results in order to provide you with the best plan of care or course of treatment. Therefore, we ask that you please give [place] 2 business days to thoroughly review all your results before contacting the office for clarification. Should we see a critical lab result, you will be contacted sooner.   If You Need Anything After Your Visit  If you have any questions or concerns for your doctor, please call our main line at [phone] and press option 4 to reach your doctor's medical assistant. If no one answers, please leave a voicemail as directed and we will return your call as soon as possible. Messages left after 4 pm will be answered the following business day.   You may also send [place] a message via MyChart. We typically respond to MyChart messages within 1-2 business days.  For prescription refills, please ask your pharmacy to contact our office. Our fax number is [phone].  If you have an urgent issue when the clinic is closed that cannot wait until the next business day, you can page your doctor at the number below.    Please note that while we do our best to be available for urgent issues outside of office hours, we are not available 24/7.   If you have an urgent issue and are unable to reach [place], you may choose to seek medical care at your doctor's office, retail clinic, urgent care center, or emergency room.  If you have a medical emergency, please immediately call 911 or go to the emergency department.  Pager Numbers  - Dr. Gwen Pounds: [phone]  - Dr. Roseanne Reno: [phone]  - Dr. Katrinka Blazing: [phone]   In the event of inclement weather, please call our main line at [phone] for an update on the status of any  delays or closures.  Dermatology Medication Tips: Please keep the boxes that topical medications come in in order to help keep track of the instructions about where and how to use these. Pharmacies typically print the medication instructions only on the boxes and not directly on the medication tubes.   If your medication is too expensive, please contact our office at [phone] option 4 or send [place] a message through MyChart.   We are unable to tell what your co-pay for medications will be in advance as this is different depending on your insurance coverage. However, we may be able to find a substitute medication at lower cost or fill out paperwork to get insurance to cover a needed medication.   If a prior authorization is required to get your medication covered by your insurance company,  please allow [place] 1-2 business days to complete this process.  Drug prices often vary depending on where the prescription is filled and some pharmacies may offer cheaper prices.  The website www.goodrx.com contains coupons for medications through different pharmacies. The prices here do not account for what the cost may be with help from insurance (it may be cheaper with your insurance), but the website can give you the price if you did not use any insurance.  - You can print the associated coupon and take it with your prescription to the pharmacy.  - You may also stop by our office during regular business hours and pick up a GoodRx coupon card.  - If you need your prescription sent electronically to a different pharmacy, notify our office through [facility] or by phone at [phone] option 4.     Si Usted Necesita Algo Despus de Su Visita  Tambin puede enviarnos un mensaje a travs de Clinical cytogeneticist. Por lo general respondemos a los mensajes de MyChart en el transcurso de 1 a 2 das hbiles.  Para renovar recetas, por favor pida a su farmacia que se ponga en contacto con nuestra oficina. Annie Sable de fax es [place] [phone].  Si tiene un asunto urgente cuando la clnica est cerrada y que no puede esperar hasta el siguiente da hbil, puede llamar/localizar a su doctor(a) al nmero que aparece a continuacin.   Por favor, tenga en cuenta que aunque hacemos todo lo posible para estar disponibles para asuntos urgentes fuera del horario de [place], no estamos disponibles las 24 horas del da, los 7 [address]  [address] de la [place].   Si tiene un problema urgente y no puede comunicarse con nosotros, puede optar por buscar atencin mdica  en el consultorio de su doctor(a), en una clnica privada, en un centro de atencin urgente o en una sala de emergencias.  Si tiene Engineer, drilling, por favor llame inmediatamente al 911 o vaya a la sala de emergencias.  Nmeros de bper  - Dr. Gwen Pounds: [phone]  - Dra. Roseanne Reno: [phone]  - Dr. Katrinka Blazing: [phone]   En caso de inclemencias del tiempo, por favor llame a Lacy Duverney principal al [phone] para una actualizacin sobre el [place] de cualquier retraso o cierre.  Consejos para la medicacin en dermatologa: Por favor, guarde las cajas en las que vienen los medicamentos de uso tpico para ayudarle a seguir las instrucciones sobre dnde y cmo usarlos. Las farmacias generalmente imprimen las instrucciones del medicamento slo en las cajas y no directamente en los tubos del [place].   Si su medicamento es muy caro, por favor, pngase en contacto con Rolm Gala llamando al [phone] y presione la opcin 4 o envenos un mensaje a travs de Clinical cytogeneticist.   No podemos decirle cul ser su copago por los medicamentos por adelantado ya que esto es diferente dependiendo de la cobertura de su seguro. Sin embargo, es posible que podamos encontrar un medicamento sustituto a Audiological scientist un formulario para que el seguro cubra el medicamento que se considera necesario.   Si se requiere una autorizacin previa para que su compaa  de seguros [place] su medicamento, por favor permtanos de 1 a 2 das hbiles para completar [address]  Los precios de los medicamentos varan con frecuencia dependiendo del Environmental consultant de dnde se surte la receta y alguna farmacias pueden ofrecer precios ms baratos.  El sitio web www.goodrx.com tiene cupones para medicamentos de Health and safety inspector. Los precios aqu no tienen en cuenta  lo que podra costar con la ayuda del seguro (puede ser ms barato con su seguro), pero el sitio web puede darle el precio si no Visual merchandiser.  - Puede imprimir el cupn correspondiente y llevarlo con su receta a la farmacia.  - Tambin puede pasar por nuestra oficina durante el horario de atencin regular y Education officer, museum una tarjeta de cupones de GoodRx.  - Si necesita que su receta se enve electrnicamente a una farmacia diferente, informe a nuestra oficina a travs de MyChart de [place] o por telfono llamando al [phone] y presione la opcin 4.

## 2023-11-07 ENCOUNTER — Encounter: Payer: Self-pay | Admitting: Dermatology

## 2023-11-08 ENCOUNTER — Ambulatory Visit: Payer: PPO | Admitting: Urology

## 2023-11-14 ENCOUNTER — Ambulatory Visit: Payer: PPO | Admitting: Urology

## 2023-11-21 ENCOUNTER — Ambulatory Visit: Payer: PPO | Admitting: Urology

## 2023-11-21 DIAGNOSIS — Z09 Encounter for follow-up examination after completed treatment for conditions other than malignant neoplasm: Secondary | ICD-10-CM

## 2023-11-21 DIAGNOSIS — N138 Other obstructive and reflux uropathy: Secondary | ICD-10-CM

## 2023-11-21 DIAGNOSIS — G4733 Obstructive sleep apnea (adult) (pediatric): Secondary | ICD-10-CM | POA: Diagnosis not present

## 2023-11-21 DIAGNOSIS — Z87438 Personal history of other diseases of male genital organs: Secondary | ICD-10-CM

## 2023-11-21 LAB — BLADDER SCAN AMB NON-IMAGING: Scan Result: 28

## 2023-11-21 NOTE — Progress Notes (Signed)
Marcelle Overlie Plume,acting as a scribe for [name], MD.,have documented all relevant documentation on the behalf of [name], MD,as directed by  [name], MD while in the presence of [name], MD.  11/21/2023 11:55 AM   Stephen Stein Stephen Stein November 25, 1941 [id number]  Referring provider: Danelle Berry, PA-C [address] [place],  [place] [address]  Chief Complaint  Patient presents with   Follow-up    HPI: 82 year-old male with a personal history of BPH, status post UroLift on 09/28/2023. Notably, he had a 34 gram prostate with evidence of bladder trabeculation consistent with chronic outlet obstruction. He was having ongoing urinary issues on Flomax, including a weak stream.   Today, he reports an improved stream. He still experiences some urgency and nocturia x3. He initially experienced complete lack of control over urination as well as burning for the first 10 days post-procedure, which has since resolved. He reports occasional urgency and leakage when needing to urinate, but has learned to manage by heading to the restroom promptly. He is no longer taking Flomax, as he is unsure if it was making a significant difference. He is overall satisfied with the outcome of the procedure, though some symptoms persist.     Results for orders placed or performed in visit on 11/21/23  BLADDER SCAN AMB NON-IMAGING  Result Value Ref Range   Scan Result 28 ml      IPSS     Row Name 11/21/23 1000         International Prostate Symptom Score   How often have you had the sensation of not emptying your bladder? Not at All     How often have you had to urinate less than every two hours? Less than 1 in 5 times     How often have you found you stopped and started again several times when you urinated? Less than half the time     How often have you found it difficult to postpone urination? About half the time     How often have you had a weak urinary stream? Less than half  the time     How often have you had to strain to start urination? Less than 1 in 5 times     How many times did you typically get up at night to urinate? 3 Times     Total IPSS Score 12       Quality of Life due to urinary symptoms   If you were to spend the rest of your life with your urinary condition just the way it is now how would you feel about that? 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 08/2018 s/p lap chole.   BPH (benign prostatic hyperplasia)    Carotid atherosclerosis, bilateral    a. 01/2019 Carotid U/S: <50% bilat ICA stenosis.   Childhood asthma    Constipation  10/21/2021   Coronary artery calcification seen on CT scan    a.) cCTA 12/16/2015: Ca2+ 892 (80th percentile for age/sex/race matched control). Diffuse Ca2+ in all 3 major coronary arteries; b.) 02/2016 MV: HTN response to exercise. Abnl ecg w/ horizontal inflat ST depression, but no ischemia/infarct on imaging; c.) 09/2018 MV: EF 64%, no ischemia. Very sm region of mild distal antsept defect, likely 2/2 LBBB   Depression    Diastolic dysfunction 10/09/2018   a.) TTE 10/09/2018: EF 45-50%, diff HK, mod LVH, mild reduced RVSF, triv AR, G1DD; b.) TTE 07/15/2022: EF 45-50%. mild LVH, mild LA dil, mild MR/AR, Ao root 42 mm   Hepatic steatosis    History of bilateral cataract extraction 11/2020   History of methicillin resistant staphylococcus aureus (MRSA) 12/01/2022   a.) RIGHT thigh wound   Hx of basal cell carcinoma 08/01/2019   R chest parasternal    Hyperlipidemia    Hypertension    Irregular heartbeat/Palpitations    a. Has been taking Atenolol for 20 years   LBBB (left bundle branch block)    Lightheadedness    Long term current use of anticoagulant    a.) apixaban   Mixed Alzheimer's and vascular dementia (HCC)    a.) on NMDA inhibitor (memantine)   Neuropathy    NSVT (nonsustained ventricular tachycardia) (HCC)    a. 11/2018 Zio monitor - 13 beats of NSVT--asymptomatic.   Orthostatic hypotension    a. 11/2018 pressures improved w/ midodrine/florinef.   Parkinson's disease (HCC)    Pre-diabetes    Pulmonary nodules    RLS (restless legs syndrome)    a.) on ropinirole   Sebaceous cyst of scrotum    Second degree AV block, Mobitz type I    Sinus bradycardia    a. 11/2018 Zio monitor: Avg HR 57 (min 28). Intermittent Mobitz I.   Wears dentures    Partial upper   Wears hearing aid in both ears     Surgical History: Past Surgical History:  Procedure Laterality Date   ANKLE FRACTURE SURGERY Left 09/28/2022   [facility]   CATARACT EXTRACTION W/PHACO Left 12/09/2020   Procedure: CATARACT EXTRACTION PHACO AND INTRAOCULAR LENS PLACEMENT (IOC) LEFT 6.94 01:09.7 9.9% ;  Surgeon: Lockie Mola, MD;  [facility];  Service: Ophthalmology;  Laterality: Left;   CATARACT EXTRACTION W/PHACO Right 12/23/2020   Procedure: CATARACT EXTRACTION PHACO AND INTRAOCULAR LENS PLACEMENT (IOC) RIGHT MALYUGIN;  Surgeon: Lockie Mola, MD;  Location: [facility];  Service: Ophthalmology;  Laterality: Right;  8.44 1:14.6 11.3%   CHOLECYSTECTOMY N/A 09/13/2018   Procedure: LAPAROSCOPIC CHOLECYSTECTOMY;  Surgeon: Leafy Ro, MD;  Location: [facility];  Service: General;  Laterality: N/A;   COLONOSCOPY  12/26/2006   CYSTOSCOPY WITH INSERTION OF UROLIFT N/A 09/28/2023   Procedure: CYSTOSCOPY WITH INSERTION OF UROLIFT (6);  Surgeon: [name], MD;  Location: [facility];  Service: Urology;  Laterality: N/A;    ESOPHAGOGASTRODUODENOSCOPY (EGD) WITH PROPOFOL N/A 08/20/2018   Procedure: ESOPHAGOGASTRODUODENOSCOPY (EGD) WITH PROPOFOL;  Surgeon: Midge Minium, MD;  Location: [facility];  Service: Endoscopy;  Laterality: N/A;   EXCISION MASS HEAD Right 03/31/2023   Procedure: EXCISION LESION AURICLE;  Surgeon: Vernie Murders, MD;  Location: [facility];  Service: ENT;  Laterality: Right;   SCROTAL EXPLORATION N/A 07/13/2023   Procedure: EXCISION OF SCROTUM MASS;  Surgeon: [name], MD;  Location: [facility];  Service: Urology;  Laterality: N/A;   TONSILLECTOMY AND ADENOIDECTOMY  12/26/1953   TOTAL SHOULDER ARTHROPLASTY  Left 09/11/2019   Procedure: TOTAL SHOULDER ARTHROPLASTY;  Surgeon: Lyndle Herrlich, MD;  Location: [facility];  Service: Orthopedics;  Laterality: Left;   TOTAL SHOULDER ARTHROPLASTY Right 08/02/2021   Procedure: TOTAL SHOULDER ARTHROPLASTY;  Surgeon: Lyndle Herrlich, MD;  Location: [facility];  Service: Orthopedics;  Laterality: Right;    Home Medications:  Allergies as of 11/21/2023   No Known Allergies      Medication List        Accurate as of November 21, 2023 11:55 AM. If you have any questions, ask your nurse or doctor.          STOP taking these medications    predniSONE 10 MG (21) Tbpk tablet Commonly known as: STERAPRED UNI-PAK 21 TAB       TAKE these medications    acetaminophen 500 MG tablet Commonly known as: TYLENOL Take 1,000 mg by mouth every 6 (six) hours as needed for moderate pain.   docusate sodium 100 MG capsule Commonly known as: COLACE Take 1 capsule (100 mg total) by mouth 2 (two) times daily.   Eliquis 5 MG Tabs tablet Generic drug: apixaban Take 5 mg by mouth 2 (two) times daily.   famotidine 20 MG tablet Commonly known as: PEPCID TAKE 1 TABLET BY MOUTH TWICE A DAY   HYDROcodone-acetaminophen 5-325 MG tablet Commonly known as: NORCO/VICODIN Take 1-2 tablets by mouth every 6 (six) hours as needed for moderate pain.    ketoconazole 2 % cream Commonly known as: NIZORAL Apply topically to affected areas qd/bid prn   meloxicam 15 MG tablet Commonly known as: MOBIC Take 15 mg by mouth daily as needed.   memantine 5 MG tablet Commonly known as: NAMENDA Take 5 mg by mouth 2 (two) times daily.   nitrofurantoin (macrocrystal-monohydrate) 100 MG capsule Commonly known as: Macrobid Take 1 capsule (100 mg total) by mouth 2 (two) times daily.   rOPINIRole 1 MG tablet Commonly known as: REQUIP Take 1 mg by mouth 4 (four) times daily.   rosuvastatin 10 MG tablet Commonly known as: CRESTOR TAKE 1 TABLET BY MOUTH EVERY DAY   sertraline 100 MG tablet Commonly known as: ZOLOFT Take 100 mg by mouth every morning.   tamsulosin 0.4 MG Caps capsule Commonly known as: FLOMAX Take 0.4 mg by mouth daily.   VITAMIN D3 PO Take 1 tablet by mouth daily.        Family History: Family History  Problem Relation Age of Onset   Cancer Mother        lung   Heart disease Father    Hearing loss Sister        coclear implant   Hearing loss Brother    Cancer Paternal Grandmother    Cancer Son        colon cancer   Colon cancer Neg Hx    Stomach cancer Neg Hx     Social History:  reports that he quit smoking about 31 years ago. His smoking use included cigarettes. He started smoking about 66 years ago. He has a 52.5 pack-year smoking history. He quit smokeless tobacco use about 31 years ago. He reports that he does not currently use alcohol after a past usage of about 1.0 standard drink of alcohol per week. He reports that he does not use drugs.   Physical Exam: There were no vitals taken for this visit.  Constitutional:  Alert and oriented, No acute distress. HEENT: [place] AT, moist mucus membranes.  Trachea midline, no masses. Neurologic:  Grossly intact, no focal deficits, moving all 4 extremities. Psychiatric: Normal mood and affect.  Assessment & Plan:    1. BPH with urinary obstruction - Status post  UroLift on 09/28/2023. He does feel that it helped and is overall satisfied - Improved urinary symptoms but ongoing nocturia and occasional urgency. - He is no longer on Flomax. He is not interested in further pharmacotherapy. - We discussed behavioral modifications, such as reducing fluid intake before bedtime, to manage nocturia.  Return in about 1 year (around 11/20/2024) for IPSS and PVR.  I have reviewed the above documentation for accuracy and completeness, and I agree with the above.   [name], MD    [facility] [address] [place], [place] [address] [phone]

## 2023-12-04 DIAGNOSIS — M25572 Pain in left ankle and joints of left foot: Secondary | ICD-10-CM | POA: Diagnosis not present

## 2023-12-04 DIAGNOSIS — Z7901 Long term (current) use of anticoagulants: Secondary | ICD-10-CM | POA: Diagnosis not present

## 2023-12-04 DIAGNOSIS — S82842S Displaced bimalleolar fracture of left lower leg, sequela: Secondary | ICD-10-CM | POA: Diagnosis not present

## 2023-12-04 DIAGNOSIS — Z981 Arthrodesis status: Secondary | ICD-10-CM | POA: Diagnosis not present

## 2023-12-06 DIAGNOSIS — R2689 Other abnormalities of gait and mobility: Secondary | ICD-10-CM | POA: Diagnosis not present

## 2023-12-06 DIAGNOSIS — G4733 Obstructive sleep apnea (adult) (pediatric): Secondary | ICD-10-CM | POA: Diagnosis not present

## 2023-12-07 ENCOUNTER — Encounter: Payer: Self-pay | Admitting: Nurse Practitioner

## 2023-12-07 ENCOUNTER — Other Ambulatory Visit: Payer: Self-pay | Admitting: Nurse Practitioner

## 2023-12-07 DIAGNOSIS — L509 Urticaria, unspecified: Secondary | ICD-10-CM

## 2023-12-14 ENCOUNTER — Ambulatory Visit: Payer: PPO

## 2023-12-19 DIAGNOSIS — G4733 Obstructive sleep apnea (adult) (pediatric): Secondary | ICD-10-CM | POA: Diagnosis not present

## 2023-12-19 DIAGNOSIS — L500 Allergic urticaria: Secondary | ICD-10-CM | POA: Diagnosis not present

## 2024-01-11 ENCOUNTER — Institutional Professional Consult (permissible substitution) (INDEPENDENT_AMBULATORY_CARE_PROVIDER_SITE_OTHER): Payer: PPO | Admitting: Otolaryngology

## 2024-01-12 ENCOUNTER — Institutional Professional Consult (permissible substitution) (INDEPENDENT_AMBULATORY_CARE_PROVIDER_SITE_OTHER): Payer: PPO | Admitting: Otolaryngology

## 2024-01-15 ENCOUNTER — Other Ambulatory Visit: Payer: Self-pay | Admitting: Cardiovascular Disease

## 2024-01-15 ENCOUNTER — Telehealth: Payer: Self-pay | Admitting: Cardiovascular Disease

## 2024-01-15 NOTE — Telephone Encounter (Signed)
Prescription refill request for Eliquis received. Indication: AF Last office visit: 06/30/23  D Whittenborn NP Scr: 1.21 on 09/27/23 Age: 83 Weight: 96.2kg  Based on above findings Eliquis 5mg  twice daily is the appropriate dose.  Refill approved.

## 2024-01-15 NOTE — Telephone Encounter (Signed)
*  STAT* If patient is at the pharmacy, call can be transferred to refill team.   1. Which medications need to be refilled? (please list name of each medication and dose if known) apixaban (ELIQUIS) 5 MG TABS tablet  2. Which pharmacy/location (including street and city if local pharmacy) is medication to be sent to? [id number] - [place], [place] - [address]  3. Do they need a 30 day or 90 day supply?   90 day supply

## 2024-01-15 NOTE — Telephone Encounter (Signed)
Disp Refills Start End   apixaban (ELIQUIS) 5 MG TABS tablet 60 tablet 5 01/15/2024 --   Sig - Route: TAKE 1 TABLET BY MOUTH TWICE A DAY - Oral   Sent to pharmacy as: apixaban (ELIQUIS) 5 MG Tab tablet   E-Prescribing Status: Receipt confirmed by pharmacy (01/15/2024  9:06 AM EST)    Pharmacy  [id number] - [place], [place] - [address]

## 2024-01-15 NOTE — Telephone Encounter (Signed)
Refill request

## 2024-01-18 DIAGNOSIS — R2681 Unsteadiness on feet: Secondary | ICD-10-CM | POA: Diagnosis not present

## 2024-01-18 DIAGNOSIS — M6281 Muscle weakness (generalized): Secondary | ICD-10-CM | POA: Diagnosis not present

## 2024-01-18 DIAGNOSIS — R2689 Other abnormalities of gait and mobility: Secondary | ICD-10-CM | POA: Diagnosis not present

## 2024-01-18 DIAGNOSIS — R296 Repeated falls: Secondary | ICD-10-CM | POA: Diagnosis not present

## 2024-01-22 ENCOUNTER — Ambulatory Visit (INDEPENDENT_AMBULATORY_CARE_PROVIDER_SITE_OTHER): Payer: PPO | Admitting: Otolaryngology

## 2024-01-22 ENCOUNTER — Telehealth: Payer: Self-pay | Admitting: Cardiovascular Disease

## 2024-01-22 ENCOUNTER — Encounter (INDEPENDENT_AMBULATORY_CARE_PROVIDER_SITE_OTHER): Payer: Self-pay | Admitting: Otolaryngology

## 2024-01-22 VITALS — BP 133/86 | HR 84 | Ht 70.0 in | Wt 220.0 lb

## 2024-01-22 DIAGNOSIS — F0150 Vascular dementia without behavioral disturbance: Secondary | ICD-10-CM

## 2024-01-22 DIAGNOSIS — J339 Nasal polyp, unspecified: Secondary | ICD-10-CM | POA: Diagnosis not present

## 2024-01-22 DIAGNOSIS — Z789 Other specified health status: Secondary | ICD-10-CM

## 2024-01-22 DIAGNOSIS — J343 Hypertrophy of nasal turbinates: Secondary | ICD-10-CM

## 2024-01-22 DIAGNOSIS — I4891 Unspecified atrial fibrillation: Secondary | ICD-10-CM | POA: Diagnosis not present

## 2024-01-22 DIAGNOSIS — J3489 Other specified disorders of nose and nasal sinuses: Secondary | ICD-10-CM

## 2024-01-22 DIAGNOSIS — R0981 Nasal congestion: Secondary | ICD-10-CM

## 2024-01-22 DIAGNOSIS — G4733 Obstructive sleep apnea (adult) (pediatric): Secondary | ICD-10-CM

## 2024-01-22 DIAGNOSIS — F0390 Unspecified dementia without behavioral disturbance: Secondary | ICD-10-CM

## 2024-01-22 DIAGNOSIS — R0982 Postnasal drip: Secondary | ICD-10-CM | POA: Diagnosis not present

## 2024-01-22 DIAGNOSIS — G20A1 Parkinson's disease without dyskinesia, without mention of fluctuations: Secondary | ICD-10-CM | POA: Diagnosis not present

## 2024-01-22 DIAGNOSIS — Z91198 Patient's noncompliance with other medical treatment and regimen for other reason: Secondary | ICD-10-CM

## 2024-01-22 DIAGNOSIS — J342 Deviated nasal septum: Secondary | ICD-10-CM

## 2024-01-22 NOTE — Progress Notes (Unsigned)
ENT CONSULT:  Reason for Consult: OSA CPAP intolerance    Ref provider: [facility] Dr Sherryll Burger  HPI: Discussed the use of AI scribe software for clinical note transcription with the patient, who gave verbal consent to proceed.  History of Present Illness   The patient is an 7 yoM, with a history of severe sleep apnea, presents for a consultation regarding an Inspire implant. They were diagnosed with sleep apnea for the first time three months ago, following a home sleep test prompted by their irregular sleep habits and frequent awakenings at night. The patient typically sleeps for a maximum of three hours at a time, often waking up after two or three hours, and then returning to sleep after an hour.  They attempted a CPAP trial for three months but discontinued due to difficulty keeping the mask on throughout the night/claustrophobia and poor quality of sleep when wearing it. The patient reported that their mouth would open during sleep, leading them to tape it shut. Despite these efforts, they would often wake up with the mask removed. He also reports significant pressure from the mask, tried nasal pillow mask.   The patient also reports a history of nasal congestion and postnasal drainage, for which they have been using a prescribed nasal spray for a couple of years. However, they have not noticed any significant improvement in their symptoms.  In addition to sleep apnea, the patient has a history of atrial fibrillation and is currently on Eliquis. They also report frequent daytime sleepiness, often falling asleep during activities such as playing cards or talking.  The patient has a history of multiple surgeries, including shoulder replacements, cataract removal, leg fracture repair, ankle fusion, and gallbladder removal. They express interest in learning more about the Inspire implant as a potential treatment for their sleep apnea.   Denies hx of insomnia, never taken a sleeping aid or  medication to go to sleep. He denies trouble falling asleep. He reports severe snoring at night.      Records Reviewed:  Neurology office visit at [facility] 12/06/23 Dr Sherryll Burger - referred to [place] for OSA  1. 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 Moderate OBSTRUCTIVE SLEEP APNEA with snoring and reports of excessive daytime sleepiness, poor CPAP compliance and tolerability.  Patient denied history suggestive of narcolepsy such as REM sleep related phenomena such as cataplexy (emotionally induced suddent loss of muscle tone), hypnagogic/hypnapompic hallucinations and sleep paralysis. No family history of narcolepsy or recent viral infection. - patient's wife says that he talks a lot during his sleep.  Addendum 08/10/2023 - home Sleep Study (3 night study) - SNAP Diagnostic 07/24/2023  Respiratory Analysis Summary: A total of 156 apneas (0 of which were Central/Mixed based on respiratory effort) and 91 hypopneas were identified. The total number of obstructive events (apneas and hypopneas) was 247. The Total Apnea Index (central/mixed and obstructive events) was 26.0 per hour. The Central/Mixed Apnea Index was 0.0 per hour based on respiratory effort. The Central/Mixed % ration was 0%. The AHI 3% (RESPIRATORY DISTURBANCE INDEX) was 41.2 per hour. During recording periods the Maximum Density of the AHI 3% (RESPIRATORY DISTURBANCE INDEX) was elevated up to 86.2. The AHI 4% was 26.8 per hour;this index includes hypopneas that exhibit oxygen desaturations of 4% or greater, and all apneas.   Interpretation: There was evidence of moderate obstructive obstructive sleep apnea based on criterion for hypopnea.  Based on 3% of criterion, the severity would be elevated to severe obstructive sleep apnea.  Patient wears his CPAP at night, but reports difficulty keeping CPAP mask on while  sleeping, despite taping his mouth shut before sleeping.  Hypoglossal nerve stimulator (inspire) is a small device (FDA approved in 2014) that is implanted like a pacemaker for obstructive sleep apnea patients who can't tolerate CPAP. It stimulates hypoglossal nerve in synchronization with breathing to keep upper airway open. It is approved for patients 18 years or older, AHI between 15 to 65, no more than 25 % central apnea (including mixed), and a BMI of 35 or less. www.inspiresleep.com   5. Low Vitamin B12 - continue taking oral supplements Vitamin B12 level 08/02/2023 labs are ok 6. Peripheral neuropathy- chronic -Tried and failed Alpha Lipoic Acid -Discussed possibly prescribing Gabapentin in the future should symptoms worsen or interfere with daily activities  Here is information over Gabapentin for reference: Gabapentin is an anticonvulsant medication, used for many different conditions. The patient was advised not to stop the medication abruptly, don't use alcohol. Commonly reported side-effects are dizziness, sleepiness, edema, blurred vision, weight gain, etc. We typically start at a low dose and gradually increase the dose to avoid/minimize potential side effects.  7. Restless Leg Syndrome  Reports Restless Leg Syndrome is controlled at this time; continue Ropinirole  9. Left malleolar ankle fracture - Surgical correction completed in 2024. Post-operative physical therapy completed - Followed by [facility], note unavailable for our review   10. Balance Patient reports imbalance, unstable gait, with frequent falls.  We will put in an order for physical therapy .     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 08/2018 s/p lap chole.   BPH (benign prostatic hyperplasia)    Carotid atherosclerosis, bilateral    a. 01/2019 Carotid U/S: <50% bilat ICA stenosis.   Childhood asthma    Constipation 10/21/2021   Coronary artery calcification seen on CT scan    a.) cCTA 12/16/2015: Ca2+ 892 (80th percentile for age/sex/race matched control). Diffuse Ca2+ in all 3 major coronary arteries; b.) 02/2016 MV: HTN response to exercise. Abnl ecg w/ horizontal inflat ST depression, but no ischemia/infarct on imaging; c.) 09/2018 MV: EF 64%, no ischemia. Very sm region of mild distal antsept defect, likely 2/2 LBBB   Depression    Diastolic dysfunction 10/09/2018   a.) TTE 10/09/2018: EF 45-50%, diff HK, mod LVH, mild reduced RVSF, triv AR, G1DD; b.) TTE 07/15/2022: EF 45-50%. 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 Has been taking Atenolol for 20 years   LBBB (left bundle branch block)    Lightheadedness    Long term current  use of anticoagulant    a.) apixaban   Mixed Alzheimer's and vascular dementia (HCC)    a.) on NMDA inhibitor (memantine)   Neuropathy    NSVT (nonsustained ventricular tachycardia) (HCC)    a. 11/2018 Zio monitor - 13 beats of NSVT--asymptomatic.   Orthostatic hypotension    a. 11/2018 pressures improved w/ midodrine/florinef.   Parkinson's disease (HCC)    Pre-diabetes    Pulmonary nodules    RLS (restless legs syndrome)    a.) on ropinirole   Sebaceous cyst of scrotum    Second degree AV block, Mobitz type I    Sinus bradycardia    a. 11/2018  Zio monitor: Avg HR 57 (min 28). Intermittent Mobitz I.   Wears dentures    Partial upper   Wears hearing aid in both ears     Past Surgical History:  Procedure Laterality Date   ANKLE FRACTURE SURGERY Left 09/28/2022   [facility]   CATARACT EXTRACTION W/PHACO Left 12/09/2020   Procedure: CATARACT EXTRACTION PHACO AND INTRAOCULAR LENS PLACEMENT (IOC) LEFT 6.94 01:09.7 9.9% ;  Surgeon: Lockie Mola, MD;  [facility];  Service: Ophthalmology;  Laterality: Left;   CATARACT EXTRACTION W/PHACO Right 12/23/2020   Procedure: CATARACT EXTRACTION PHACO AND INTRAOCULAR LENS PLACEMENT (IOC) RIGHT MALYUGIN;  Surgeon: Lockie Mola, MD;  Location: [facility];  Service: Ophthalmology;  Laterality: Right;  8.44 1:14.6 11.3%   CHOLECYSTECTOMY N/A 09/13/2018   Procedure: LAPAROSCOPIC CHOLECYSTECTOMY;  Surgeon: Leafy Ro, MD;  Location: [facility];  Service: General;  Laterality: N/A;   COLONOSCOPY  12/26/2006   CYSTOSCOPY WITH INSERTION OF UROLIFT N/A 09/28/2023   Procedure: CYSTOSCOPY WITH INSERTION OF UROLIFT (6);  Surgeon: [name], MD;  Location: [facility];  Service: Urology;  Laterality: N/A;   ESOPHAGOGASTRODUODENOSCOPY (EGD) WITH PROPOFOL N/A 08/20/2018   Procedure: ESOPHAGOGASTRODUODENOSCOPY (EGD) WITH PROPOFOL;  Surgeon: Midge Minium, MD;  Location: [facility];  Service: Endoscopy;  Laterality: N/A;   EXCISION MASS HEAD Right 03/31/2023   Procedure: EXCISION LESION AURICLE;  Surgeon: Vernie Murders, MD;  Location: [facility];  Service: ENT;  Laterality: Right;   SCROTAL EXPLORATION N/A 07/13/2023   Procedure: EXCISION OF SCROTUM MASS;  Surgeon: [name], MD;  Location: [facility];  Service: Urology;  Laterality: N/A;   TONSILLECTOMY AND ADENOIDECTOMY  12/26/1952   TOTAL SHOULDER ARTHROPLASTY Left 09/11/2019   Procedure: TOTAL SHOULDER ARTHROPLASTY;  Surgeon: Lyndle Herrlich, MD;  Location: [facility];  Service: Orthopedics;  Laterality:  Left;   TOTAL SHOULDER ARTHROPLASTY Right 08/02/2021   Procedure: TOTAL SHOULDER ARTHROPLASTY;  Surgeon: Lyndle Herrlich, MD;  Location: [facility];  Service: Orthopedics;  Laterality: Right;    Family History  Problem Relation Age of Onset   Cancer Mother        lung   Heart disease Father    Hearing loss Sister        coclear implant   Hearing loss Brother    Cancer Paternal Grandmother    Cancer Son        colon cancer   Colon cancer Neg Hx    Stomach cancer Neg Hx     Social History:  reports that he quit smoking about 31 years ago. His smoking use included cigarettes. He started smoking about 66 years ago. He has a 52.5 pack-year smoking history. He quit smokeless tobacco use about 31 years ago. He reports that he does not currently use alcohol after a past usage of about 1.0 standard  drink of alcohol per week. He reports that he does not use drugs.  Allergies: No Known Allergies  Medications: I have reviewed the patient's current medications.  The PMH, PSH, Medications, Allergies, and SH were reviewed and updated.  ROS: Constitutional: Negative for fever, weight loss and weight gain. Cardiovascular: Negative for chest pain and dyspnea on exertion. Respiratory: Is not experiencing shortness of breath at rest. Gastrointestinal: Negative for nausea and vomiting. Neurological: Negative for headaches. Psychiatric: The patient is not nervous/anxious  Blood pressure 133/86, pulse 84, height 5\' 10"  (1.778 m), weight 220 lb (99.8 kg), SpO2 96%.  PHYSICAL EXAM:  Exam: General: Well-developed, well-nourished Respiratory Respiratory effort: Equal inspiration and expiration without stridor Cardiovascular Peripheral Vascular: Warm extremities with equal color/perfusion Eyes: No nystagmus with equal extraocular motion bilaterally Neuro/Psych/Balance: Patient oriented to person, place, and time; Appropriate mood and affect; Gait is intact with no imbalance; Cranial nerves I-XII are  intact Head and Face Inspection: Normocephalic and atraumatic without mass or lesion Palpation: Facial skeleton intact without bony stepoffs Salivary Glands: No mass or tenderness Facial Strength: Facial motility symmetric and full bilaterally ENT Pinna: External ear intact and fully developed External canal: Canal is patent with intact skin Tympanic Membrane: Clear and mobile External Nose: No scar or anatomic deformity Internal Nose: Septum is deviates with S shaped configuration. Two right sided polyps when right nasal passage examined, no purulence. Mucosal edema and erythema present. Clear secretions present.  Bilateral inferior turbinate hypertrophy.  Lips, Teeth, and gums: Mucosa and teeth intact and viable TMJ: No pain to palpation with full mobility Oral cavity/oropharynx: No erythema or exudate, no lesions present. Friedman II tongue position. No tonsillar hypertrophy.  Nasopharynx: No mass or lesion with intact mucosa Hypopharynx: Intact mucosa without pooling of secretions Larynx Glottic: Full true vocal cord mobility without lesion or mass Supraglottic: Normal appearing epiglottis and AE folds Interarytenoid Space: Moderate pachydermia&edema Subglottic Space: Patent without lesion or edema Neck Neck and Trachea: Midline trachea without mass or lesion Thyroid: No mass or nodularity Lymphatics: No lymphadenopathy  Procedure: Preoperative diagnosis: OSA, CPAP intolerance   Postoperative diagnosis:   Same  Procedure: Flexible fiberoptic laryngoscopy  Surgeon: Ashok Croon, MD  Anesthesia: Topical lidocaine and Afrin Complications: None Condition is stable throughout exam  Indications and consent:  The patient presents to the clinic with Indirect laryngoscopy view was incomplete. Thus it was recommended that they undergo a flexible fiberoptic laryngoscopy. All of the risks, benefits, and potential complications were reviewed with the patient preoperatively and  verbal informed consent was obtained.  Procedure: The patient was seated upright in the clinic. Topical lidocaine and Afrin were applied to the nasal cavity. After adequate anesthesia had occurred, I then proceeded to pass the flexible telescope into the nasal cavity. The nasal cavity was patent without rhinorrhea or polyp. The nasopharynx was also patent without mass or lesion. The base of tongue was visualized and was normal. There were no signs of pooling of secretions in the piriform sinuses. The true vocal folds were mobile bilaterally. There were no signs of glottic or supraglottic mucosal lesion or mass. There was moderate interarytenoid pachydermia and post cricoid edema. The telescope was then slowly withdrawn and the patient tolerated the procedure throughout.    PROCEDURE NOTE: nasal endoscopy  Preoperative diagnosis: chronic nasal congestion symptoms   Postoperative diagnosis: same + right sided nasal polyps septal deviation and inferior turbinate hypertrophy   Procedure: Diagnostic nasal endoscopy (96045)  Surgeon: Ashok Croon, M.D.  Anesthesia: Topical lidocaine and  Afrin  H&P REVIEW: The patient's history and physical were reviewed today prior to procedure. All medications were reviewed and updated as well. Complications: None Condition is stable throughout exam Indications and consent: The patient presents with symptoms of chronic sinusitis not responding to previous therapies. All the risks, benefits, and potential complications were reviewed with the patient preoperatively and informed consent was obtained. The time out was completed with confirmation of the correct procedure.   Procedure: The patient was seated upright in the clinic. Topical lidocaine and Afrin were applied to the nasal cavity. After adequate anesthesia had occurred, the rigid nasal endoscope was passed into the nasal cavity. The nasal mucosa, turbinates, septum, and sinus drainage pathways were  visualized bilaterally. This revealed no purulence or significant secretions that might be cultured. There were right sided polyps noted. The mucosa was intact and there was no crusting present, but there was evidence of mucosal edema, inferior turbinate hypertrophy and septal deviation with S-shaped septum. The scope was then slowly withdrawn and the patient tolerated the procedure well. There were no complications or blood loss.   Studies Reviewed: Home Sleep Study 07/24/2023 BMI 31 AHI (4%) 26.8 No cental or mixed apneas   Respiratory Analysis Summary: A total of 156 apneas (0 of which were Central/Mixed based on respiratory effort) and 91 hypopneas were identified. The total number of obstructive events (apneas and hypopneas) was 247. The Total Apnea Index (central/mixed and obstructive events) was 26.0 per hour. The Central/Mixed Apnea Index was 0.0 per hour based on respiratory effort. The Central/Mixed % ration was 0%. The AHI 3% (RESPIRATORY DISTURBANCE INDEX) was 41.2 per hour. During recording periods the Maximum Density of the AHI 3% (RESPIRATORY DISTURBANCE INDEX) was elevated up to 86.2. The AHI 4% was 26.8 per hour;this index includes hypopneas that exhibit oxygen desaturations of 4% or greater, and all apneas.   Interpretation: There was evidence of moderate obstructive obstructive sleep apnea based on criterion for hypopnea. Based on 3% of criterion, the severity would be elevated to severe obstructive sleep apnea.  Patient wears his CPAP at night, but reports difficulty keeping CPAP mask on while sleeping, despite taping his mouth shut before sleeping.  Hypoglossal nerve stimulator (inspire) is a small device (FDA approved in 2014) that is implanted like a pacemaker for obstructive sleep apnea patients who can't tolerate CPAP. It stimulates hypoglossal nerve in synchronization with breathing to keep upper airway open. It is approved for patients 18 years or older, AHI between 15 to 65,  no more than 25 % central apnea (including mixed), and a BMI of 35 or less. www.inspiresleep.com  Assessment/Plan: Encounter Diagnoses  Name Primary?   OSA (obstructive sleep apnea) Yes   Intolerance of continuous positive airway pressure (CPAP) ventilation    Chronic nasal congestion    Post-nasal drip    Nasal obstruction    Nasal septal deviation    Hypertrophy of both inferior nasal turbinates    Nasal polyp    Parkinson's disease, unspecified whether dyskinesia present, unspecified whether manifestations fluctuate (HCC)    Mixed dementia (HCC)    Atrial fibrillation, unspecified type (HCC)     Assessment and Plan    Obstructive Sleep Apnea (OSA) and CPAP intolerance  Severe OSA diagnosed a few months ago 07/24/23 using SNAP home sleep study with AHI (4%) 26.8 No cental or mixed apneas  BMI of 31. Patient discontinued CPAP after three months use due to mask discomfort and frequent awakenings/poor quality of sleep. Interested in [employer] implant  surgery. Discussed procedure, including two-incision approach, device placement, and risks (anesthesia complications, hematoma, nerve praxia, pneumothorax). Emphasized elective nature and need for drug-induced sleep endoscopy to confirm candidacy. Noted a chance of complete concentric collapse at the soft palate, reducing implant effectiveness. Advised holding blood thinners preoperatively and restarting postoperatively with close monitoring for Inspire Implant surgery (he is on Eliquis for Afib).  OSA, moderate, without  multilevel collapse, with failure to tolerate PAP therapy and/or more conservative measures. Presence of absent tonsils and larger tongue position (Friedman tongue position or modified Mallampati) suggests that hypopharyngeal/retrolingual collapse is contributing to the patient's OSA. Zachery Conch, M et al. Staging of obstructive sleep apnea/hypopnea syndrome: a guide to appropriate treatment. Laryngoscope, 2004 Mar, 114(3):454-9.  PMID: [id number]) Options including positional therapy, weight loss, oral appliances, PAP and surgical correction discussed. Pt is not an ideal candidate for oral appliance due to severity of OSA Pt could be a candidate for Hypoglossal nerve stimulation (Inspire therapy) pending DISE   - Schedule drug-induced sleep endoscopy - Provide [employer] information - Schedule follow-up appointment in March after DISE to discuss Inspire Implant   Chronic nasal congestion, exam with evidence of Nasal Polyps/NSD/ITH Nasal polyps observed during nasal endoscopy, likely due to chronic allergic inflammation. Long-term nasal spray use without significant improvement. Polyps may obstruct nasal breathing; surgical removal discussed. Consider nasal steroid rinses with Budesonide  - Budesonide nasal rinses  - Educate on nasal rinses with salt water and steroid solution  Atrial Fibrillation (AFib) Patient on anticoagulation therapy with Eliquis for Afib. Discussed increased bleeding risk with surgery. Requires cardiology clearance for blood thinner management around surgery. - Obtain cardiology clearance for blood thinner management before surgery   Patient is 83 years old with multiple medical conditions. Advised staying in town post-surgery for close monitoring. Hx of Parkinson's disease, mixed dementia and Afib. F/b Neurology Dr Sherryll Burger at [facility] staying in town for at least two weeks post-surgery for monitoring  Follow-up - Schedule follow-up appointment in March to discuss Inspire Implant surgery  - Coordinate with surgery scheduler for sleep endoscopy prior to f/u - Provide after-visit summary with [facility] link.     Thank you for allowing me to participate in the care of this patient. Please do not hesitate to contact me with any questions or concerns.   Ashok Croon, MD Otolaryngology [facility] Phone: [phone] Fax:  [phone]    01/23/2024, 2:05 PM

## 2024-01-22 NOTE — Telephone Encounter (Signed)
   Pre-operative Risk Assessment    Patient Name: Stephen Stein  DOB: [birth year] MRN: [id number]   Date of last office visit: 06/30/23 Date of next office visit: 03/12/24   Request for Surgical Clearance    Procedure:   Inspire Implant  Date of Surgery:  Clearance TBD                                Surgeon:  Dr. Ashok Croon Surgeon's Group or Practice Name:  [facility] Phone number:  [phone] Fax number:  [phone]   Type of Clearance Requested:   - Pharmacy:  Hold Afib, blood thinners  prior to   Type of Anesthesia:  General    Additional requests/questions:    Stephen Stein   01/22/2024, 3:21 PM

## 2024-01-22 NOTE — Patient Instructions (Signed)
[email or web address]  This is the link to find a patient who has Inspire Implant to learn more about their experience

## 2024-01-23 DIAGNOSIS — R2681 Unsteadiness on feet: Secondary | ICD-10-CM | POA: Diagnosis not present

## 2024-01-23 DIAGNOSIS — M6281 Muscle weakness (generalized): Secondary | ICD-10-CM | POA: Diagnosis not present

## 2024-01-23 DIAGNOSIS — R2689 Other abnormalities of gait and mobility: Secondary | ICD-10-CM | POA: Diagnosis not present

## 2024-01-23 DIAGNOSIS — R296 Repeated falls: Secondary | ICD-10-CM | POA: Diagnosis not present

## 2024-01-24 NOTE — Telephone Encounter (Signed)
Left message to call back to schedule a sooner appt in office as pt now needs preop clearance.

## 2024-01-24 NOTE — Telephone Encounter (Signed)
Will route to PharmD for rec's re: holding anticoagulation.  Callback - please check with surgeon to make sure medical clearance is NOT needed. Request is only for anticoagulation. If needs medical clearance too, pt will need in person office visit (last office visit was in 11/2022). There was a tele visit for surgical clearance in 06/2023. But, more than a year since last office visit.  Tereso Newcomer, PA-C    01/24/2024 8:02 AM

## 2024-01-24 NOTE — Telephone Encounter (Signed)
I s/w Stephen Stein at Dr. Irene Pap office and was able to confirm the surgeon will need both cardiac and pharm clearance. I did inform their office the pt will need an appt in the office as he has not been seen in over 1 yr per preop app. Pt is currently scheduled to see Dr. Mariah Milling 03/12/24. I will see about trying to get the pt in sooner.   Once the pt has been cleared our office will send clearance notes.

## 2024-01-24 NOTE — Telephone Encounter (Signed)
Patient does not wish to be schedule for a sleep study.

## 2024-01-25 NOTE — Telephone Encounter (Signed)
Made another attempt to reach pt but was unsuccessful.

## 2024-01-26 NOTE — Telephone Encounter (Signed)
Preoperative team, patient will need office appointment with cardiology.  Please add preoperative cardiac evaluation to appointment notes for patient's upcoming appointment with Dr. Mariah Milling.  If patient is able to get sooner appointment please add preoperative cardiac evaluation to appointment notes.  I will remove patient from preoperative pool.  We will await pharmacy recommendations.  Thank you.  Thomasene Ripple. Abdi Husak NP-C     01/26/2024, 12:25 PM [facility] [address] Office [phone] Fax [phone]

## 2024-01-27 NOTE — Telephone Encounter (Signed)
Patient with diagnosis of atrial fibrillation on Eliquis for anticoagulation.    Procedure:   Inspire Implant   Date of Surgery:  Clearance TBD      CHA2DS2-VASc Score = 4   This indicates a 4.8% annual risk of stroke. The patient's score is based upon: CHF History: 0 HTN History: 1 Diabetes History: 0 Stroke History: 0 Vascular Disease History: 1 Age Score: 2 Gender Score: 0    CrCl 68 Platelet count 245  Per office protocol, patient can hold Eliquis for 2 days prior to procedure.   Patient will not need bridging with Lovenox (enoxaparin) around procedure.  **This guidance is not considered finalized until pre-operative APP has relayed final recommendations.**

## 2024-01-29 NOTE — Telephone Encounter (Signed)
Left message for pt to call back and let us know if he needed a sooner appt for surgical clearance or if he was just going to wai until he sees Dr. Mariah Milling in March.

## 2024-01-30 ENCOUNTER — Telehealth: Payer: Self-pay | Admitting: Cardiovascular Disease

## 2024-01-30 NOTE — Telephone Encounter (Signed)
Wife [name]) called to report patient has decided not to do surgery and no longer needs a sooner appointment.

## 2024-01-30 NOTE — Telephone Encounter (Signed)
I will update the requesting office pt's wife called this morning and states the pt has decided to not have procedure.

## 2024-01-30 NOTE — Telephone Encounter (Signed)
 I will update the requesting office that our office has tried a number of times to reach the pt to see if he wanted to try and schedule a sooner appt with Dr. Gollan or APP as he now needs preop clearance. Pt has not returned our calls. Pt call can call back to our scheduling team [phone] if he wants to schedule a sooner appt for his f/u and preop clearance.   If pt wants to wait until his appt with Dr. Gollan 02/2024 for preop clearance that is fine as well.

## 2024-01-30 NOTE — Telephone Encounter (Signed)
 I will update the requesting office pt's wife called this morning and states the pt has decided to not have procedure.            Tag   Copy Tanda Needle B    Telephone Encounter Signed   Creation Time: 01/30/2024  9:21 AM   Signed     Wife Idell) called to report patient has decided not to do surgery and no longer needs a sooner appointment.

## 2024-02-22 ENCOUNTER — Encounter (INDEPENDENT_AMBULATORY_CARE_PROVIDER_SITE_OTHER): Payer: PPO | Admitting: Otolaryngology

## 2024-03-11 NOTE — Progress Notes (Unsigned)
 Date:  03/12/2024   ID:  Stephen Stein, DOB [birth year], MRN [id number]  Patient Location:  [address] [place] [address]   Provider location: Okay I think we still came to EP [facility], [place] office  PCP:  Danelle Berry, PA-C  Cardiologist:  Fonnie Mu  Chief Complaint  Patient presents with   12 month follow up     "Doing well."     History of Present Illness:    Stephen Stein is a 83 y.o. male with  past medical history of smoking history, stopped 20 years ago,  hyperlipidemia,  palpitations, treated with atenolol,  CT coronary calcium score with score  800,  Minor carotid intimal thickening and atherosclerosis. 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 Atrial fibrillation with slow ventricular rate Echo July 2023 EF 45 to 50% who presents for routine follow-up Of his coronary artery disease, permanent atrial fibrillation  LOV 12/23  Seen by EP November 2023 At that time with bradycardia, second-degree AV block type I, relatively asymptomatic  Now with permanent atrial fibrillation slow ventricular rate in the 30s Wife who presents with him today reports he has weakness, fatigue Activity limited by leg neuropathy, gait instability Sedentery at baseline  Part of his poor balance is secondary to prior trauma left lower extremity after falling off a bridge, broke leg 2 years ago Has had ankle fused, chronic leg swelling worse on the left than the right  Occasionally with dizzy spells  Echocardiogram July 2023 Ejection fraction appears relatively unchanged from study in 2019 EF estimated 45 to 50% No significant valvular heart disease Normal right ventricular size and function  Tolerating Eliquis 5 twice daily  EKG personally reviewed by myself on todays visit EKG Interpretation Date/Time:  Tuesday March 12 2024 14:55:01  EDT Ventricular Rate:  39 PR Interval:    QRS Duration:  166 QT Interval:  592 QTC Calculation: 476 R Axis:   -27  Text Interpretation: Atrial fibrillation with slow ventricular response Left bundle branch block When compared with ECG of 27-Sep-2023 09:42, No significant change was found Confirmed by Julien Nordmann [phone]) on 03/12/2024 3:13:42 PM    Prior history of AV nodal disease 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 Graciela Husbands July 2022 Asymptomatic, no pacer at that time Orthostatics were negative, treadmill study performed: improved condution  Suffered a fall May 2023 Fractured several ribs on the left  Lab work reviewed Total cholesterol 128 LDL 71 Creatinine 1.2 BUN 19 Hemoglobin 14  Reports prior diagnosis of Parkinson's Chronic dizziness, gait instability  CT coronary calcium score, 800 He has three-vessel coronary artery disease, no significant aortic atherosclerosis   Previous stress test showing no significant ischemia.  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 08/2018 s/p lap chole.   BPH (benign prostatic hyperplasia)    Carotid atherosclerosis, bilateral    a. 01/2019 Carotid U/S: <50% bilat ICA stenosis.   Childhood asthma    Constipation 10/21/2021    Coronary artery calcification seen on CT scan    a.) cCTA 12/16/2015: Ca2+ 892 (80th percentile for age/sex/race matched control). Diffuse Ca2+ in all 3 major coronary arteries; b.) 02/2016 MV: HTN response to exercise. Abnl ecg w/ horizontal inflat ST depression, but no ischemia/infarct on imaging; c.) 09/2018 MV: EF 64%, no ischemia. Very sm region of mild distal antsept defect, likely 2/2 LBBB   Depression    Diastolic dysfunction 10/09/2018   a.) TTE 10/09/2018: EF 45-50%, diff HK, mod LVH, mild reduced RVSF, triv AR, G1DD; b.) TTE 07/15/2022: EF 45-50%. 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 Has been taking Atenolol for 20 years   LBBB (left bundle branch block)    Lightheadedness    Long term current use of anticoagulant    a.) apixaban   Mixed Alzheimer's and vascular dementia (HCC)    a.) on NMDA inhibitor (memantine)   Neuropathy    NSVT (nonsustained ventricular tachycardia) (HCC)    a. 11/2018 Zio monitor - 13 beats of NSVT--asymptomatic.   Orthostatic hypotension    a. 11/2018 pressures improved w/ midodrine/florinef.   Parkinson's disease (HCC)    Pre-diabetes    Pulmonary nodules    RLS (restless legs syndrome)    a.) on ropinirole   Sebaceous cyst of scrotum    Second degree AV block, Mobitz type I    Sinus bradycardia    a. 11/2018 Zio monitor: Avg HR 57 (min 28). Intermittent Mobitz I.   Wears dentures    Partial upper   Wears hearing aid in both ears    Past Surgical History:  Procedure Laterality Date   ANKLE FRACTURE SURGERY Left 09/28/2022   [facility]   CATARACT EXTRACTION W/PHACO Left 12/09/2020   Procedure: CATARACT EXTRACTION PHACO AND  INTRAOCULAR LENS PLACEMENT (IOC) LEFT 6.94 01:09.7 9.9% ;  Surgeon: Lockie Mola, MD;  Location[facility];  Service: Ophthalmology;  Laterality: Left;   CATARACT EXTRACTION W/PHACO Right 12/23/2020   Procedure: CATARACT EXTRACTION PHACO AND INTRAOCULAR LENS PLACEMENT (IOC) RIGHT MALYUGIN;  Surgeon: Lockie Mola, MD;  Location: [facility];  Service: Ophthalmology;  Laterality: Right;  8.44 1:14.6 11.3%   CHOLECYSTECTOMY N/A 09/13/2018   Procedure: LAPAROSCOPIC CHOLECYSTECTOMY;  Surgeon: Leafy Ro, MD;  Location: [facility];  Service: General;  Laterality: N/A;   COLONOSCOPY  12/26/2006   CYSTOSCOPY WITH INSERTION OF UROLIFT N/A 09/28/2023   Procedure: CYSTOSCOPY WITH INSERTION OF UROLIFT (6);  Surgeon: [name], MD;  Location: [facility];  Service: Urology;  Laterality: N/A;   ESOPHAGOGASTRODUODENOSCOPY (EGD) WITH PROPOFOL N/A 08/20/2018   Procedure: ESOPHAGOGASTRODUODENOSCOPY (EGD) WITH PROPOFOL;  Surgeon: Midge Minium, MD;  Location: [facility];  Service: Endoscopy;  Laterality: N/A;   EXCISION MASS HEAD Right 03/31/2023   Procedure: EXCISION LESION AURICLE;  Surgeon: Vernie Murders, MD;  Location: [facility];  Service: ENT;  Laterality: Right;   SCROTAL EXPLORATION N/A 07/13/2023   Procedure: EXCISION OF SCROTUM MASS;  Surgeon: [name], MD;  Location: [facility];  Service: Urology;  Laterality: N/A;   TONSILLECTOMY AND ADENOIDECTOMY  12/26/1953   TOTAL SHOULDER ARTHROPLASTY Left 09/11/2019   Procedure: TOTAL SHOULDER ARTHROPLASTY;  Surgeon: Lyndle Herrlich, MD;  Location: [facility];  Service: Orthopedics;  Laterality: Left;   TOTAL SHOULDER ARTHROPLASTY Right 08/02/2021   Procedure: TOTAL SHOULDER ARTHROPLASTY;  Surgeon: Lyndle Herrlich, MD;  Location: [facility];  Service: Orthopedics;  Laterality: Right;     Current Meds  Medication Sig   acetaminophen (TYLENOL) 500 MG tablet Take 1,000 mg by mouth every 6 (six) hours as needed for  moderate pain.   apixaban (ELIQUIS) 5 MG TABS tablet TAKE 1 TABLET BY MOUTH TWICE A DAY   Cholecalciferol (VITAMIN D3 PO) Take 1 tablet by mouth daily.   ketoconazole (NIZORAL) 2 % cream Apply topically to affected areas qd/bid prn   meloxicam (MOBIC) 15 MG tablet Take 15 mg by mouth daily as needed.   rOPINIRole (REQUIP) 1 MG tablet Take 1 mg by mouth 4 (four) times daily.   rosuvastatin (CRESTOR) 10 MG tablet TAKE 1 TABLET BY MOUTH EVERY DAY   sertraline (ZOLOFT) 100 MG tablet Take 100 mg by mouth every morning.   tamsulosin (FLOMAX) 0.4 MG CAPS capsule Take 0.4 mg by mouth daily.     Allergies:   Patient has no known allergies.   Social History   Tobacco Use   Smoking status: Former    Current packs/day: 0.00    Average packs/day: 1.5 packs/day for 35.0 years (52.5 ttl pk-yrs)    Types: Cigarettes    Start date: 06/22/1957    Quit date: 06/22/1992    Years since quitting: 31.7   Smokeless tobacco: Former    Quit date: 06/22/1992  Vaping Use   Vaping status: Never Used  Substance Use Topics   Alcohol use: Not Currently    Alcohol/week: 1.0 standard drink of alcohol    Types: 1 Cans of beer per week    Comment: rarely   Drug use: No    Family Hx: The patient's family history includes Cancer in his mother, paternal grandmother, and son; Hearing loss in his brother and sister; Heart disease in his father. There is no history of Colon cancer or Stomach cancer.  ROS:   Please see the history of present illness.    Review of Systems  Constitutional: Negative.   Respiratory: Negative.    Cardiovascular: Negative.   Gastrointestinal: Negative.   Musculoskeletal: Negative.   Neurological:  Positive for dizziness.  Psychiatric/Behavioral: Negative.    All other systems reviewed and are negative.    Labs/Other Tests and Data Reviewed:    Recent Labs: 04/21/2023: ALT 10 09/27/2023: BUN 25; Creatinine, Ser 1.21; Hemoglobin 13.3; Platelets 245; Potassium 4.5; Sodium 134    Recent Lipid Panel Lab Results  Component Value Date/Time   CHOL 116 04/21/2023 10:17 AM   CHOL 110 06/01/2016 08:40 AM   TRIG 89 04/21/2023 10:17 AM   HDL 44 04/21/2023 10:17 AM   HDL 39 (L) 06/01/2016 08:40 AM   CHOLHDL 2.6 04/21/2023 10:17 AM   LDLCALC 55 04/21/2023 10:17 AM    Wt Readings from Last 3 Encounters:  03/12/24 214 lb 6 oz (97.2 kg)  01/22/24 220 lb (99.8 kg)  09/28/23 210 lb (95.3 kg)     Exam:  BP (!) 124/52 (BP Location: Left Arm, Patient Position: Sitting, Cuff Size: Normal)   Pulse (!) 39   Ht 5\' 10"  (1.778 m)   Wt 214 lb 6 oz (97.2 kg)   SpO2 96%   BMI 30.76 kg/m  Constitutional:  oriented to person, place, and time. No distress.  HENT:  Head: Grossly normal Eyes:  no discharge. No scleral icterus.  Neck: No JVD, no carotid bruits  Cardiovascular: Regular rate and rhythm, no murmurs appreciated Pulmonary/Chest: Clear to auscultation bilaterally, no wheezes or rails Abdominal: Soft.  no distension.  no tenderness.  Musculoskeletal: Normal range of motion Neurological:  normal muscle tone. Coordination normal. No atrophy Skin: Skin warm and dry Psychiatric: normal affect, pleasant   ASSESSMENT & PLAN:    sick sinus syndrome/persistent atrial fibrillation/flutter Bradycardic rates in the 30s, likely permanent atrial fibrillation Previously declined cardioversion  On ambulation in the clinic, heart rate up to 78 bpm Previously seen by Dr. Graciela Husbands, recommend he reestablish with EP, new referral placed given left bundle branch block, sick sinus syndrome, slow ventricular rate in the 30s  Mixed hyperlipidemia - Plan: EKG 12-Lead Cholesterol is at goal on the current lipid regimen. No changes to the medications were made.  Coronary artery disease involving native coronary artery of native heart without angina pectoris - Plan: EKG 12-Lead Prior stress test with no significant ischemia October 2019 Currently with no symptoms of angina. No further  workup at this time. Continue current medication regimen.  Dizziness Etiology unclear,  has cognitive issues, Parkinson's Also with bradycardia    Signed, Julien Nordmann, MD  03/12/2024 3:13 PM    [facility] [address], [place], [place] [address]

## 2024-03-12 ENCOUNTER — Encounter: Payer: Self-pay | Admitting: Cardiovascular Disease

## 2024-03-12 ENCOUNTER — Ambulatory Visit: Payer: PPO | Attending: Cardiovascular Disease | Admitting: Cardiovascular Disease

## 2024-03-12 VITALS — BP 124/52 | HR 39 | Ht 70.0 in | Wt 214.4 lb

## 2024-03-12 DIAGNOSIS — I447 Left bundle-branch block, unspecified: Secondary | ICD-10-CM | POA: Diagnosis not present

## 2024-03-12 DIAGNOSIS — R001 Bradycardia, unspecified: Secondary | ICD-10-CM

## 2024-03-12 DIAGNOSIS — I10 Essential (primary) hypertension: Secondary | ICD-10-CM | POA: Diagnosis not present

## 2024-03-12 DIAGNOSIS — I25118 Atherosclerotic heart disease of native coronary artery with other forms of angina pectoris: Secondary | ICD-10-CM | POA: Diagnosis not present

## 2024-03-12 DIAGNOSIS — I4891 Unspecified atrial fibrillation: Secondary | ICD-10-CM

## 2024-03-12 DIAGNOSIS — R42 Dizziness and giddiness: Secondary | ICD-10-CM | POA: Diagnosis not present

## 2024-03-12 DIAGNOSIS — I6523 Occlusion and stenosis of bilateral carotid arteries: Secondary | ICD-10-CM | POA: Diagnosis not present

## 2024-03-12 DIAGNOSIS — I441 Atrioventricular block, second degree: Secondary | ICD-10-CM | POA: Diagnosis not present

## 2024-03-12 DIAGNOSIS — E782 Mixed hyperlipidemia: Secondary | ICD-10-CM | POA: Diagnosis not present

## 2024-03-12 DIAGNOSIS — I700 Atherosclerosis of aorta: Secondary | ICD-10-CM

## 2024-03-12 NOTE — Patient Instructions (Signed)
 Referral to EP for atrial fibrillation and bradycardia, LBBB  Medication Instructions:  No changes  If you need a refill on your cardiac medications before your next appointment, please call your pharmacy.   Lab work: No new labs needed  Testing/Procedures: No new testing needed  Follow-Up: At [facility], you and your health needs are our priority.  As part of our continuing mission to provide you with exceptional heart care, we have created designated Provider Care Teams.  These Care Teams include your primary Cardiologist (physician) and Advanced Practice Providers (APPs -  Physician Assistants and Nurse Practitioners) who all work together to provide you with the care you need, when you need it.  You will need a follow up appointment in 12 months  Providers on your designated Care Team:   Nicolasa Ducking, NP Eula Listen, PA-C Cadence Fransico Michael, [place]  COVID-19 Vaccine Information can be found at: [email or web address] For questions related to vaccine distribution or appointments, please email [email or web address] or call [phone].

## 2024-03-26 ENCOUNTER — Ambulatory Visit: Attending: Cardiology | Admitting: Cardiology

## 2024-03-26 ENCOUNTER — Encounter: Payer: Self-pay | Admitting: Cardiology

## 2024-03-26 ENCOUNTER — Other Ambulatory Visit: Payer: Self-pay

## 2024-03-26 VITALS — BP 142/62 | HR 46 | Ht 70.0 in | Wt 209.0 lb

## 2024-03-26 DIAGNOSIS — I4891 Unspecified atrial fibrillation: Secondary | ICD-10-CM | POA: Diagnosis not present

## 2024-03-26 DIAGNOSIS — R001 Bradycardia, unspecified: Secondary | ICD-10-CM

## 2024-03-26 DIAGNOSIS — D6869 Other thrombophilia: Secondary | ICD-10-CM

## 2024-03-26 DIAGNOSIS — I5022 Chronic systolic (congestive) heart failure: Secondary | ICD-10-CM | POA: Diagnosis not present

## 2024-03-26 DIAGNOSIS — I4821 Permanent atrial fibrillation: Secondary | ICD-10-CM | POA: Diagnosis not present

## 2024-03-26 DIAGNOSIS — I447 Left bundle-branch block, unspecified: Secondary | ICD-10-CM | POA: Diagnosis not present

## 2024-03-26 NOTE — Patient Instructions (Signed)
 Medication Instructions:  Your physician recommends that you continue on your current medications as directed. Please refer to the Current Medication list given to you today.  *If you need a refill on your cardiac medications before your next appointment, please call your pharmacy*  Lab Work: BMET and CBC If you have labs (blood work) drawn today and your tests are completely normal, you will receive your results only by: MyChart Message (if you have MyChart) OR A paper copy in the mail If you have any lab test that is abnormal or we need to change your treatment, we will call you to review the results.  Testing/Procedures: Echocardiogram  Your physician has requested that you have an echocardiogram. Echocardiography is a painless test that uses sound waves to create images of your heart. It provides your doctor with information about the size and shape of your heart and how well your heart's chambers and valves are working. This procedure takes approximately one hour. There are no restrictions for this procedure. Please do NOT wear cologne, perfume, aftershave, or lotions (deodorant is allowed). Please arrive 15 minutes prior to your appointment time.  Pacemaker  Your physician has recommended that you have a pacemaker inserted. A pacemaker is a small device that is placed under the skin of your chest or abdomen to help control abnormal heart rhythms. This device uses electrical pulses to prompt the heart to beat at a normal rate. Pacemakers are used to treat heart rhythms that are too slow. Wire (leads) are attached to the pacemaker that goes into the chambers of you heart. This is done in the hospital and usually requires and overnight stay. Please see the instruction sheet given to you today for more information.  Follow-Up: At [facility], you and your health needs are our priority.  As part of our continuing mission to provide you with exceptional heart care, our providers are  all part of one team.  This team includes your primary Cardiologist (physician) and Advanced Practice Providers or APPs (Physician Assistants and Nurse Practitioners) who all work together to provide you with the care you need, when you need it.  Your next appointment:   We will call you to arrange your post-procedure appointments

## 2024-03-26 NOTE — H&P (View-Only) (Signed)
 Electrophysiology Office Note:   Date:  03/26/2024  ID:  Stephen Stein, DOB [birth year], MRN [id number]  Primary Cardiologist: Julien Nordmann, MD Electrophysiologist: Nobie Putnam, MD      History of Present Illness:   Stephen Stein is a 83 y.o. male with h/o former tobacco use, hyperlipidemia permanent atrial fibrillation, coronary calcification, Parkinson's disease who is being seen today for evaluation of bradycardia at the request of Dr. Mariah Milling.  Discussed the use of AI scribe software for clinical note transcription with the patient, who gave verbal consent to proceed.  History of Present Illness Stephen Stein is accompanied by Stephen Stein, his wife.He has a history of atrial fibrillation and is concerned about his low heart rate and exertional intolerance. He reports his heart rate has been as low as 19 beats per minute, with recent EKGs showing rates in the thirties. He was previously prescribed atenolol for irregular heartbeats, but no longer takes. He experiences fatigue and decreased stamina per his wife.He has experienced episodes of syncope, including once while driving, but currently has no dizziness or lightheadedness. He reports unsteadiness, mainly when standing, and has a history of falls. A past leg fracture required ankle fusion, contributing to his unsteadiness. He engages in physical activities such as repairs around the house and on his motor home but notes a significant decrease in stamina and cognitive abilities. Tasks take longer to complete, and he experiences pain in his fused ankle, especially on uneven surfaces. According to his wife he also experiences dyspnea with activity. He is currently taking Eliquis for stroke prevention due to atrial fibrillation. Initially, he experienced significant bruising and bleeding with minor injuries but has not had recent issues.    Review of systems complete and found to be negative unless listed in HPI.   EP Information / Studies Reviewed:     EKG is not ordered today. EKG from 03/12/24 reviewed which showed atrial fibrillation with slow ventricular response and LBBB.      Echo 07/15/22:  1. Left ventricular ejection fraction, by estimation, is 45 to 50%. The  left ventricle has mildly decreased function. The left ventricle has no  regional wall motion abnormalities. There is mild left ventricular  hypertrophy. Left ventricular diastolic  parameters are indeterminate.   2. Right ventricular systolic function is normal. The right ventricular  size is normal.   3. Left atrial size was mildly dilated.   4. The mitral valve is normal in structure. Mild mitral valve  regurgitation. No evidence of mitral stenosis.   5. The aortic valve is normal in structure. Aortic valve regurgitation is  mild. No aortic stenosis is present.   6. The inferior vena cava is normal in size with greater than 50%  respiratory variability, suggesting right atrial pressure of 3 mmHg.   Risk Assessment/Calculations:    CHA2DS2-VASc Score = 4   This indicates a 4.8% annual risk of stroke. The patient's score is based upon: CHF History: 0 HTN History: 1 Diabetes History: 0 Stroke History: 0 Vascular Disease History: 1 Age Score: 2 Gender Score: 0         Physical Exam:   VS:  BP (!) 142/62   Pulse (!) 46   Ht 5\' 10"  (1.778 m)   Wt 209 lb (94.8 kg)   SpO2 96%   BMI 29.99 kg/m    Wt Readings from Last 3 Encounters:  03/26/24 209 lb (94.8 kg)  03/12/24 214 lb 6 oz (97.2 kg)  01/22/24 220 lb (99.8  kg)     GEN: Well nourished, well developed in no acute distress NECK: No JVD CARDIAC: Bradycardic, irregular RESPIRATORY:  Clear to auscultation without rales, wheezing or rhonchi  ABDOMEN: Soft, non-distended EXTREMITIES:  No edema; No deformity   ASSESSMENT AND PLAN:    #. Symptomatic bradycardia: Heart rates are consistently in the 30s.  Patient has exertional symptoms which I suspect are from chronotropic incompetence.  #. LBBB:  Known conduction disease. There has been concern on prior ECGs about AV block. -Patient meets criteria for permanent pacer due to symptomatic bradycardia, chronotropic incompetence, history of AV block and permanent AF with rates <40bpm. Explained risks, benefits, and alternatives to pacemaker implantation intensively with patient and his wife, including but not limited to bleeding, infection, damage to heart or lungs, heart attack, stroke, or death.  Pt and wife verbalized understanding and agrees to proceed.  #. Permanent atrial fibrillation: With slow ventricular response.  #. Secondary hypercoagulable state due to atrial fibrillation: CHADSVASC score of 4. -Will hold 4 doses of Eliquis prior to implant. Will try to minimize interruption due to CHADSVASC score of 4 and permanent AF. Continue Eliquis otherwise.  #. 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 Appears well compensated on exam.  -Obtain repeat echocardiogram to assess LVEF prior to device implant.   Follow up with Dr. Jimmey Ralph 3 months after implant.  Signed, Nobie Putnam, MD, [facility]

## 2024-03-26 NOTE — Progress Notes (Signed)
 Electrophysiology Office Note:   Date:  03/26/2024  ID:  Stephen Stein, DOB [birth year], MRN [id number]  Primary Cardiologist: Julien Nordmann, MD Electrophysiologist: Nobie Putnam, MD      History of Present Illness:   Stephen Stein is a 83 y.o. male with h/o former tobacco use, hyperlipidemia permanent atrial fibrillation, coronary calcification, Parkinson's disease who is being seen today for evaluation of bradycardia at the request of Dr. Mariah Milling.  Discussed the use of AI scribe software for clinical note transcription with the patient, who gave verbal consent to proceed.  History of Present Illness Stephen Stein is accompanied by [name], his wife.He has a history of atrial fibrillation and is concerned about his low heart rate and exertional intolerance. He reports his heart rate has been as low as 19 beats per minute, with recent EKGs showing rates in the thirties. He was previously prescribed atenolol for irregular heartbeats, but no longer takes. He experiences fatigue and decreased stamina per his wife.He has experienced episodes of syncope, including once while driving, but currently has no dizziness or lightheadedness. He reports unsteadiness, mainly when standing, and has a history of falls. A past leg fracture required ankle fusion, contributing to his unsteadiness. He engages in physical activities such as repairs around the house and on his motor home but notes a significant decrease in stamina and cognitive abilities. Tasks take longer to complete, and he experiences pain in his fused ankle, especially on uneven surfaces. According to his wife he also experiences dyspnea with activity. He is currently taking Eliquis for stroke prevention due to atrial fibrillation. Initially, he experienced significant bruising and bleeding with minor injuries but has not had recent issues.    Review of systems complete and found to be negative unless listed in HPI.   EP Information / Studies Reviewed:     EKG is not ordered today. EKG from 03/12/24 reviewed which showed atrial fibrillation with slow ventricular response and LBBB.      Echo 07/15/22:  1. Left ventricular ejection fraction, by estimation, is 45 to 50%. The  left ventricle has mildly decreased function. The left ventricle has no  regional wall motion abnormalities. There is mild left ventricular  hypertrophy. Left ventricular diastolic  parameters are indeterminate.   2. Right ventricular systolic function is normal. The right ventricular  size is normal.   3. Left atrial size was mildly dilated.   4. The mitral valve is normal in structure. Mild mitral valve  regurgitation. No evidence of mitral stenosis.   5. The aortic valve is normal in structure. Aortic valve regurgitation is  mild. No aortic stenosis is present.   6. The inferior vena cava is normal in size with greater than 50%  respiratory variability, suggesting right atrial pressure of 3 mmHg.   Risk Assessment/Calculations:    CHA2DS2-VASc Score = 4   This indicates a 4.8% annual risk of stroke. The patient's score is based upon: CHF History: 0 HTN History: 1 Diabetes History: 0 Stroke History: 0 Vascular Disease History: 1 Age Score: 2 Gender Score: 0         Physical Exam:   VS:  BP (!) 142/62   Pulse (!) 46   Ht 5\' 10"  (1.778 m)   Wt 209 lb (94.8 kg)   SpO2 96%   BMI 29.99 kg/m    Wt Readings from Last 3 Encounters:  03/26/24 209 lb (94.8 kg)  03/12/24 214 lb 6 oz (97.2 kg)  01/22/24 220 lb (99.8  kg)     GEN: Well nourished, well developed in no acute distress NECK: No JVD CARDIAC: Bradycardic, irregular RESPIRATORY:  Clear to auscultation without rales, wheezing or rhonchi  ABDOMEN: Soft, non-distended EXTREMITIES:  No edema; No deformity   ASSESSMENT AND PLAN:    #. Symptomatic bradycardia: Heart rates are consistently in the 30s.  Patient has exertional symptoms which I suspect are from chronotropic incompetence.  #. LBBB:  Known conduction disease. There has been concern on prior ECGs about AV block. -Patient meets criteria for permanent pacer due to symptomatic bradycardia, chronotropic incompetence, history of AV block and permanent AF with rates <40bpm. Explained risks, benefits, and alternatives to pacemaker implantation intensively with patient and his wife, including but not limited to bleeding, infection, damage to heart or lungs, heart attack, stroke, or death.  Pt and wife verbalized understanding and agrees to proceed.  #. Permanent atrial fibrillation: With slow ventricular response.  #. Secondary hypercoagulable state due to atrial fibrillation: CHADSVASC score of 4. -Will hold 4 doses of Eliquis prior to implant. Will try to minimize interruption due to CHADSVASC score of 4 and permanent AF. Continue Eliquis otherwise.  #. 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 Appears well compensated on exam.  -Obtain repeat echocardiogram to assess LVEF prior to device implant.   Follow up with Dr. Jimmey Ralph 3 months after implant.  Signed, Nobie Putnam, MD, [facility]

## 2024-03-27 ENCOUNTER — Ambulatory Visit (HOSPITAL_COMMUNITY): Attending: Cardiology

## 2024-03-27 DIAGNOSIS — R001 Bradycardia, unspecified: Secondary | ICD-10-CM | POA: Insufficient documentation

## 2024-03-27 DIAGNOSIS — I4821 Permanent atrial fibrillation: Secondary | ICD-10-CM | POA: Insufficient documentation

## 2024-03-27 LAB — CBC
Hematocrit: 40.7 % (ref 37.5–51.0)
Hemoglobin: 13.2 g/dL (ref 13.0–17.7)
MCH: 30.6 pg (ref 26.6–33.0)
MCHC: 32.4 g/dL (ref 31.5–35.7)
MCV: 94 fL (ref 79–97)
Platelets: 210 10*3/uL (ref 150–450)
RBC: 4.32 x10E6/uL (ref 4.14–5.80)
RDW: 12.8 % (ref 11.6–15.4)
WBC: 8.4 10*3/uL (ref 3.4–10.8)

## 2024-03-27 LAB — BASIC METABOLIC PANEL WITH GFR
BUN/Creatinine Ratio: 21 (ref 10–24)
BUN: 25 mg/dL (ref 8–27)
CO2: 24 mmol/L (ref 20–29)
Calcium: 10 mg/dL (ref 8.6–10.2)
Chloride: 103 mmol/L (ref 96–106)
Creatinine, Ser: 1.2 mg/dL (ref 0.76–1.27)
Glucose: 90 mg/dL (ref 70–99)
Potassium: 4.6 mmol/L (ref 3.5–5.2)
Sodium: 142 mmol/L (ref 134–144)
eGFR: 61 mL/min/{1.73_m2} (ref >59)

## 2024-03-27 LAB — ECHOCARDIOGRAM COMPLETE
Est EF: 50
S' Lateral: 3.7 cm

## 2024-04-07 ENCOUNTER — Encounter: Payer: Self-pay | Admitting: Cardiology

## 2024-04-08 ENCOUNTER — Telehealth (HOSPITAL_COMMUNITY): Payer: Self-pay

## 2024-04-08 NOTE — Telephone Encounter (Addendum)
 Call placed to patient to discuss upcoming procedure.   Confirmed patient is scheduled for a Permanent transvenous pacemaker (PPM) on Monday, April 21 with Dr. Clinton Danas. Instructed patient to arrive at the Main Entrance A at [facility]: [address] [place], [place] [address] and check in at Admitting at 10:30 AM.   Labs completed  Any recent signs of acute illness or been started on antibiotics? No Any new medications started? No Any medications to hold? Hold Eliquis for 3 days per Dr. Daneil Dunker Medication instructions:  On the morning of your procedure take all other morning medications with small sips of water.  No eating or drinking after midnight prior to procedure.   The night before your procedure and the morning of your procedure scrub your neck/chest with the CHG surgical soap.   Advised of plan to go home the same day and will only stay overnight if medically necessary. You MUST have a responsible adult to drive you home and MUST be with you the first 24 hours after you arrive home.  Patient verbalized understanding to all instructions provided and agreed to proceed with procedure.

## 2024-04-12 NOTE — Pre-Procedure Instructions (Signed)
Instructed patient on the following items: Arrival time 1030  Nothing to eat or drink after midnight No meds AM of procedure Responsible person to drive you home and stay with you for 24 hrs Wash with special soap night before and morning of procedure 

## 2024-04-15 ENCOUNTER — Encounter (HOSPITAL_COMMUNITY)
Admission: RE | Disposition: A | Discharge status: Home / Self Care | Payer: Self-pay | Source: Home / Self Care | Attending: Cardiology

## 2024-04-15 ENCOUNTER — Ambulatory Visit (HOSPITAL_COMMUNITY)

## 2024-04-15 ENCOUNTER — Other Ambulatory Visit: Payer: Self-pay

## 2024-04-15 ENCOUNTER — Ambulatory Visit (HOSPITAL_COMMUNITY)
Admission: RE | Admit: 2024-04-15 | Discharge: 2024-04-15 | Disposition: A | Discharge status: Home / Self Care | Attending: Cardiology | Admitting: Cardiology

## 2024-04-15 DIAGNOSIS — I5022 Chronic systolic (congestive) heart failure: Secondary | ICD-10-CM | POA: Diagnosis not present

## 2024-04-15 DIAGNOSIS — I442 Atrioventricular block, complete: Secondary | ICD-10-CM | POA: Diagnosis not present

## 2024-04-15 DIAGNOSIS — I447 Left bundle-branch block, unspecified: Secondary | ICD-10-CM | POA: Insufficient documentation

## 2024-04-15 DIAGNOSIS — Z7901 Long term (current) use of anticoagulants: Secondary | ICD-10-CM | POA: Diagnosis not present

## 2024-04-15 DIAGNOSIS — I517 Cardiomegaly: Secondary | ICD-10-CM | POA: Diagnosis not present

## 2024-04-15 DIAGNOSIS — D6869 Other thrombophilia: Secondary | ICD-10-CM | POA: Insufficient documentation

## 2024-04-15 DIAGNOSIS — I4821 Permanent atrial fibrillation: Secondary | ICD-10-CM | POA: Diagnosis not present

## 2024-04-15 DIAGNOSIS — Z96612 Presence of left artificial shoulder joint: Secondary | ICD-10-CM | POA: Diagnosis not present

## 2024-04-15 DIAGNOSIS — Z96611 Presence of right artificial shoulder joint: Secondary | ICD-10-CM | POA: Diagnosis not present

## 2024-04-15 DIAGNOSIS — R001 Bradycardia, unspecified: Secondary | ICD-10-CM

## 2024-04-15 DIAGNOSIS — Z87891 Personal history of nicotine dependence: Secondary | ICD-10-CM | POA: Diagnosis not present

## 2024-04-15 HISTORY — PX: PACEMAKER IMPLANT: EP1218

## 2024-04-15 SURGERY — PACEMAKER IMPLANT

## 2024-04-15 MED ORDER — ACETAMINOPHEN 325 MG PO TABS: 325.0000 mg | ORAL_TABLET | ORAL | Status: DC | PRN

## 2024-04-15 MED ORDER — CEFAZOLIN SODIUM-DEXTROSE 2-4 GM/100ML-% IV SOLN
INTRAVENOUS | Status: AC
  Filled 2024-04-15: qty 100

## 2024-04-15 MED ORDER — LIDOCAINE HCL (PF) 1 % IJ SOLN
INTRAMUSCULAR | Status: AC
  Filled 2024-04-15: qty 60

## 2024-04-15 MED ORDER — APIXABAN 5 MG PO TABS: 5.0000 mg | ORAL_TABLET | Freq: Two times a day (BID) | ORAL | 5 refills | Status: AC

## 2024-04-15 MED ORDER — SODIUM CHLORIDE 0.9 % IV SOLN
INTRAVENOUS | Status: AC
  Filled 2024-04-15: qty 2

## 2024-04-15 MED ORDER — SODIUM CHLORIDE 0.9 % IV SOLN: INTRAVENOUS | Status: DC

## 2024-04-15 MED ORDER — SODIUM CHLORIDE 0.9 % IV SOLN
80.0000 mg | INTRAVENOUS | Status: AC
  Administered 2024-04-15: 80 mg

## 2024-04-15 MED ORDER — POVIDONE-IODINE 10 % EX SWAB: 2.0000 | Freq: Once | CUTANEOUS | Status: DC

## 2024-04-15 MED ORDER — ONDANSETRON HCL 4 MG/2ML IJ SOLN: 4.0000 mg | Freq: Four times a day (QID) | INTRAMUSCULAR | Status: DC | PRN

## 2024-04-15 MED ORDER — LIDOCAINE HCL (PF) 1 % IJ SOLN
INTRAMUSCULAR | Status: DC | PRN
  Administered 2024-04-15: 40 mL

## 2024-04-15 MED ORDER — CEFAZOLIN SODIUM-DEXTROSE 2-4 GM/100ML-% IV SOLN
2.0000 g | INTRAVENOUS | Status: AC
  Administered 2024-04-15: 2 g via INTRAVENOUS

## 2024-04-15 MED ORDER — CHLORHEXIDINE GLUCONATE 4 % EX SOLN
4.0000 | Freq: Once | CUTANEOUS | Status: DC
Start: 2024-04-15 — End: 2024-04-15

## 2024-04-15 SURGICAL SUPPLY — 11 items
CABLE SURGICAL S-101-97-12 (CABLE) ×2 IMPLANT
CATH CPS LOCATOR 3D MED (CATHETERS) IMPLANT
LEAD ULTIPACE 65 LPA1231/65 (Lead) IMPLANT
PACEMAKER ASSURITY SR-SF (Pacemaker) IMPLANT
PAD DEFIB RADIO PHYSIO CONN (PAD) ×2 IMPLANT
SHEATH 9FR PRELUDE SNAP 13 (SHEATH) IMPLANT
SHEATH PROBE COVER 6X72 (BAG) IMPLANT
SLITTER AGILIS HISPRO (INSTRUMENTS) IMPLANT
TOOL HELIX LOCKING (MISCELLANEOUS) IMPLANT
TRAY PACEMAKER INSERTION (PACKS) ×2 IMPLANT
WIRE HI TORQ VERSACORE-J 145CM (WIRE) IMPLANT

## 2024-04-15 NOTE — Progress Notes (Signed)
 Patient and wife was given discharge instructions. Both verbalized understanding.

## 2024-04-15 NOTE — Interval H&P Note (Signed)
 History and Physical Interval Note:  04/15/2024 1:11 PM  Stephen Stein  has presented today for surgery, with the diagnosis of symptomatic bradycardia.  The various methods of treatment have been discussed with the patient and family. After consideration of risks, benefits and other options for treatment, the patient has consented to  Procedure(s): PACEMAKER IMPLANT (N/A) as a surgical intervention.  The patient's history has been reviewed, patient examined, no change in status, stable for surgery.  I have reviewed the patient's chart and labs.  Questions were answered to the patient's satisfaction.     Ardeen Kohler

## 2024-04-15 NOTE — Discharge Instructions (Addendum)
 Resume Eliquis  on the morning of 04/17/24.  After Your Pacemaker   You have a Abbott Pacemaker  ACTIVITY Do not lift your arm above shoulder height for 1 week after your procedure. After 7 days, you may progress as below.  You should remove your sling 24 hours after your procedure, unless otherwise instructed by your provider.     Monday April 22, 2024  Tuesday April 23, 2024 Wednesday April 24, 2024 Thursday Apr 25, 2024   Do not lift, push, pull, or carry anything over 10 pounds with the affected arm until 6 weeks (Monday May 27, 2024 ) after your procedure.   You may drive AFTER your wound check, unless you have been told otherwise by your provider.   Ask your healthcare provider when you can go back to work   INCISION/Dressing If you are on a blood thinner such as Coumadin, Xarelto, Eliquis , Plavix, or Pradaxa please confirm with your provider when this should be resumed. 04/17/2024  If large square, outer bandage is left in place, this can be removed after 24 hours from your procedure. Do not remove steri-strips or glue as below.   If a PRESSURE DRESSING (a bulky dressing that usually goes up over your shoulder) was applied or left in place, please follow instructions given by your provider on when to return to have this removed.   Monitor your Pacemaker site for redness, swelling, and drainage. Call the device clinic at [phone] if you experience these symptoms or fever/chills.  If your incision is sealed with Steri-strips or staples, you may shower 7 days after your procedure or when told by your provider. Do not remove the steri-strips or let the shower hit directly on your site. You may wash around your site with soap and water .    If you were discharged in a sling, please do not wear this during the day more than 48 hours after your surgery unless otherwise instructed. This may increase the risk of stiffness and soreness in your shoulder.   Avoid lotions, ointments, or  perfumes over your incision until it is well-healed.  You may use a hot tub or a pool AFTER your wound check appointment if the incision is completely closed.  Pacemaker Alerts:  Some alerts are vibratory and others beep. These are NOT emergencies. Please call our office to let us  know. If this occurs at night or on weekends, it can wait until the next business day. Send a remote transmission.  If your device is capable of reading fluid status (for heart failure), you will be offered monthly monitoring to review this with you.   DEVICE MANAGEMENT Remote monitoring is used to monitor your pacemaker from home. This monitoring is scheduled every 91 days by our office. It allows us  to keep an eye on the functioning of your device to ensure it is working properly. You will routinely see your Electrophysiologist annually (more often if necessary).   You should receive your ID card for your new device in 4-8 weeks. Keep this card with you at all times once received. Consider wearing a medical alert bracelet or necklace.  Your Pacemaker may be MRI compatible. This will be discussed at your next office visit/wound check.  You should avoid contact with strong electric or magnetic fields.   Do not use amateur (ham) radio equipment or electric (arc) welding torches. MP3 player headphones with magnets should not be used. Some devices are safe to use if held at least 12 inches (30 cm)  from your Pacemaker. These include power tools, lawn mowers, and speakers. If you are unsure if something is safe to use, ask your health care provider.  When using your cell phone, hold it to the ear that is on the opposite side from the Pacemaker. Do not leave your cell phone in a pocket over the Pacemaker.  You may safely use electric blankets, heating pads, computers, and microwave ovens.  Call the office right away if: You have chest pain. You feel more short of breath than you have felt before. You feel more  light-headed than you have felt before. Your incision starts to open up.  This information is not intended to replace advice given to you by your health care provider. Make sure you discuss any questions you have with your health care provider.

## 2024-04-16 ENCOUNTER — Encounter (HOSPITAL_COMMUNITY): Payer: Self-pay | Admitting: Cardiology

## 2024-05-01 ENCOUNTER — Ambulatory Visit

## 2024-05-15 ENCOUNTER — Ambulatory Visit: Payer: Self-pay | Admitting: Cardiology

## 2024-05-15 ENCOUNTER — Ambulatory Visit: Attending: Cardiology

## 2024-05-15 DIAGNOSIS — I441 Atrioventricular block, second degree: Secondary | ICD-10-CM | POA: Diagnosis not present

## 2024-05-15 LAB — CUP PACEART INCLINIC DEVICE CHECK
Battery Remaining Longevity: 122 mo
Battery Voltage: 3.02 V
Brady Statistic RV Percent Paced: 97 %
Date Time Interrogation Session: 20250521154834
Implantable Lead Connection Status: 753985
Implantable Lead Implant Date: 20250421
Implantable Lead Location: 753860
Implantable Pulse Generator Implant Date: 20250421
Lead Channel Impedance Value: 450 Ohm
Lead Channel Pacing Threshold Amplitude: 0.5 V
Lead Channel Pacing Threshold Pulse Width: 0.5 ms
Lead Channel Sensing Intrinsic Amplitude: 7.7 mV
Lead Channel Setting Pacing Amplitude: 0.75 V
Lead Channel Setting Pacing Pulse Width: 0.5 ms
Lead Channel Setting Sensing Sensitivity: 2.5 mV
Pulse Gen Model: 1272
Pulse Gen Serial Number: 8159260

## 2024-05-15 NOTE — Patient Instructions (Signed)

## 2024-05-15 NOTE — Progress Notes (Signed)
 Normal single chamber ICD wound check. Wound well healed. Presenting rhythm: VP/VS.  Routine testing performed. Thresholds, sensing, and impedances consistent with implant measurements at chronic outputs, no new lead implanted. No treated arrhythmias. Reviewed shock plan.  Pt enrolled in remote follow-up.

## 2024-05-29 ENCOUNTER — Ambulatory Visit (INDEPENDENT_AMBULATORY_CARE_PROVIDER_SITE_OTHER)

## 2024-05-29 DIAGNOSIS — I441 Atrioventricular block, second degree: Secondary | ICD-10-CM

## 2024-05-29 LAB — CUP PACEART REMOTE DEVICE CHECK
Battery Remaining Longevity: 127 mo
Battery Remaining Percentage: 95 %
Battery Voltage: 3.02 V
Brady Statistic RV Percent Paced: 99 %
Date Time Interrogation Session: 20250604040014
Implantable Lead Connection Status: 753985
Implantable Lead Implant Date: 20250421
Implantable Lead Location: 753860
Implantable Pulse Generator Implant Date: 20250421
Lead Channel Impedance Value: 460 Ohm
Lead Channel Pacing Threshold Amplitude: 0.625 V
Lead Channel Pacing Threshold Pulse Width: 0.5 ms
Lead Channel Sensing Intrinsic Amplitude: 7.8 mV
Lead Channel Setting Pacing Amplitude: 0.875
Lead Channel Setting Pacing Pulse Width: 0.5 ms
Lead Channel Setting Sensing Sensitivity: 2.5 mV
Pulse Gen Model: 1272
Pulse Gen Serial Number: 8159260

## 2024-06-10 ENCOUNTER — Other Ambulatory Visit: Payer: Self-pay | Admitting: Family Medicine

## 2024-06-10 DIAGNOSIS — G20B1 Parkinson's disease with dyskinesia, without mention of fluctuations: Secondary | ICD-10-CM

## 2024-06-10 DIAGNOSIS — Z9181 History of falling: Secondary | ICD-10-CM

## 2024-06-10 DIAGNOSIS — Z7409 Other reduced mobility: Secondary | ICD-10-CM | POA: Insufficient documentation

## 2024-06-10 DIAGNOSIS — M6281 Muscle weakness (generalized): Secondary | ICD-10-CM

## 2024-06-10 DIAGNOSIS — G629 Polyneuropathy, unspecified: Secondary | ICD-10-CM

## 2024-06-10 NOTE — Progress Notes (Signed)
 Pt and wife wrote a letter asking for letter for stair lift Pt has multiple falls, recent ankle fracture due to fall, neuropathy, parkinsons with anticipated gradual worsening of gait/balance/mobility and stair lift would help prevent falls and help with his mobility throughout his home.  Recent Ortho/PT visit noted the following visit Dx:  muscle weakness (generalized) Other abnormalities of gait and mobility Repeated falls Unsteadiness on feet  Ordered the lift and will provide a letter for pt to use.    ICD-10-CM   1. Neuropathy  G62.9 For home use only DME Other see comment    2. At high risk for falls  Z91.81 For home use only DME Other see comment    3. Parkinson's disease with dyskinesia, unspecified whether manifestations fluctuate (HCC)  G20.B1 For home use only DME Other see comment    4. Muscle weakness (generalized)  M62.81 For home use only DME Other see comment    5. Impaired functional mobility, balance, gait, and endurance  Z74.09 For home use only DME Other see comment     Adeline Hone, PA-C

## 2024-06-29 ENCOUNTER — Ambulatory Visit: Payer: Self-pay | Admitting: Cardiology

## 2024-07-11 DIAGNOSIS — G309 Alzheimer's disease, unspecified: Secondary | ICD-10-CM | POA: Diagnosis not present

## 2024-07-11 DIAGNOSIS — G629 Polyneuropathy, unspecified: Secondary | ICD-10-CM | POA: Diagnosis not present

## 2024-07-11 DIAGNOSIS — E538 Deficiency of other specified B group vitamins: Secondary | ICD-10-CM | POA: Diagnosis not present

## 2024-07-11 DIAGNOSIS — R2689 Other abnormalities of gait and mobility: Secondary | ICD-10-CM | POA: Diagnosis not present

## 2024-07-11 DIAGNOSIS — Z1331 Encounter for screening for depression: Secondary | ICD-10-CM | POA: Diagnosis not present

## 2024-07-11 DIAGNOSIS — G4733 Obstructive sleep apnea (adult) (pediatric): Secondary | ICD-10-CM | POA: Diagnosis not present

## 2024-07-11 DIAGNOSIS — G2581 Restless legs syndrome: Secondary | ICD-10-CM | POA: Diagnosis not present

## 2024-07-11 DIAGNOSIS — F0280 Dementia in other diseases classified elsewhere without behavioral disturbance: Secondary | ICD-10-CM | POA: Diagnosis not present

## 2024-07-11 DIAGNOSIS — F0150 Vascular dementia without behavioral disturbance: Secondary | ICD-10-CM | POA: Diagnosis not present

## 2024-07-15 DIAGNOSIS — Z950 Presence of cardiac pacemaker: Secondary | ICD-10-CM | POA: Insufficient documentation

## 2024-07-15 NOTE — Progress Notes (Signed)
 Electrophysiology Clinic Note    Date:  07/16/2024  Patient ID:  Stephen Stein, Stephen Stein January 25, 1941, MRN [id number] PCP:  Leavy Mole, PA-C  Cardiologist:  Evalene Lunger, MD   Electrophysiologist:  Fonda Kitty, MD  Electrophysiology APP:  Aryssa Rosamond, NP     Discussed the use of AI scribe software for clinical note transcription with the patient, who gave verbal consent to proceed.   Patient Profile    Chief Complaint: 90d post-PPM implant  History of Present Illness: Stephen Stein is a 83 y.o. male with PMH notable for perm afib, symptomatic bradycardia s/p PPM, coronary calcifications, parkinson's disease; seen today for Fonda Kitty, MD for routine electrophysiology follow-up s/p Pacemaker implant.  He is s/p single chamber PPM implant 03/2024 after found to have profound bradycardia with his perm AFib.   On follow-up today, he is doing very well without complaints. He says that he does not noticed his PPM at all. He denies pain at implant site or swelling.  He continues to take xarelto daily, has superficial cuts to forearms. Denies significant bleeding concerns.      Arrhythmia/Device History St. Jude single chamber PPM, imp 03/2024; dx symptomatic bradycardia  RV lead is LBBAP    ROS:  Please see the history of present illness. All other systems are reviewed and otherwise negative.    Physical Exam    VS:  BP 130/68 (BP Location: Left Arm, Patient Position: Sitting, Cuff Size: Normal)   Pulse 72   Resp 18   Ht 5' 10 (1.778 m)   Wt 215 lb (97.5 kg)   SpO2 96%   BMI 30.85 kg/m  BMI: Body mass index is 30.85 kg/m.      Wt Readings from Last 3 Encounters:  07/16/24 215 lb (97.5 kg)  04/15/24 210 lb (95.3 kg)  03/26/24 209 lb (94.8 kg)     GEN- The patient is well appearing, alert and oriented x 3 today.   Lungs- Clear to ausculation bilaterally, normal work of breathing.  Heart- Regular rate and rhythm, no murmurs, rubs or gallops Extremities-  No peripheral edema, warm, dry Skin-  device pocket well-healed, no tethering   Device interrogation done today and reviewed by myself:  Battery 8.6 years Lead threshold, impedence, sensing stable  Dependent down to VVI 40 VP 99% No episodes Increased RV threshold and turned on auto-threshold since patient is dependent  Studies Reviewed   Previous EP, cardiology notes.    EKG is ordered. Personal review of EKG from today shows:    EKG Interpretation Date/Time:  Tuesday July 16 2024 13:29:07 EDT Ventricular Rate:  70 PR Interval:    QRS Duration:  154 QT Interval:  482 QTC Calculation: 520 R Axis:   -36  Text Interpretation: Ventricular-paced rhythm When compared with ECG of 15-Apr-2024 17:06, No significant change was found Confirmed by Tanji Storrs [phone]) on 07/16/2024 1:41:02 PM    TTE, 03/27/2024  1. Left ventricular ejection fraction, by estimation, is 50%. The left ventricle has mildly decreased function. The left ventricle demonstrates global hypokinesis. Left ventricular diastolic parameters are indeterminate.   2. Right ventricular systolic function is normal. The right ventricular size is mildly enlarged. Tricuspid regurgitation signal is inadequate for assessing PA pressure.   3. Left atrial size was mildly dilated.   4. The mitral valve is normal in structure. Mild to moderate mitral valve regurgitation. No evidence of mitral stenosis.   5. The aortic valve is tricuspid. There is mild  calcification of the aortic valve. Aortic valve regurgitation is trivial. No aortic stenosis is present.   6. Aortic dilatation noted. There is mild dilatation of the aortic root, measuring 39 mm.   7. The inferior vena cava is normal in size with greater than 50% respiratory variability, suggesting right atrial pressure of 3 mmHg.   8. The patient appears to be in slow atrial fibrillation.   TTE, 07/15/2022 1. Left ventricular ejection fraction, by estimation, is 45 to 50%. The left  ventricle has mildly decreased function. The left ventricle has no regional wall motion abnormalities. There is mild left ventricular hypertrophy. Left ventricular diastolic parameters are indeterminate.   2. Right ventricular systolic function is normal. The right ventricular size is normal.   3. Left atrial size was mildly dilated.   4. The mitral valve is normal in structure. Mild mitral valve regurgitation. No evidence of mitral stenosis.   5. The aortic valve is normal in structure. Aortic valve regurgitation is mild. No aortic stenosis is present.   6. The inferior vena cava is normal in size with greater than 50% respiratory variability, suggesting right atrial pressure of 3 mmHg.   Comparison(s): EF 45%, mild LVH, diffuse hypokinesis, trivial AI.      Assessment and Plan     #) symptomatic bradycardia #) PPM in situ #) perm afib Pacemaker functioning well, see paceart for details High VP at 99% Dependent today    #) Hypercoag d/t perm afib CHA2DS2-VASc Score = at least 4 [CHF History: 0, HTN History: 1, Diabetes History: 0, Stroke History: 0, Vascular Disease History: 1, Age Score: 2, Gender Score: 0].  Therefore, the patient's annual risk of stroke is 4.8 %.    Stroke ppx - 5mg  eliquis  BID, appropriately dosed No bleeding concerns        Current medicines are reviewed at length with the patient today.   The patient does not have concerns regarding his medicines.  The following changes were made today:  none  Labs/ tests ordered today include:  Orders Placed This Encounter  Procedures   EKG 12-Lead     Disposition: Follow up with Dr. Kennyth or EP APP in 12 months, continue remote monitoring   Signed, Chantal Needle, NP  07/16/24  2:02 PM  Electrophysiology [facility]

## 2024-07-16 ENCOUNTER — Ambulatory Visit: Payer: Self-pay

## 2024-07-16 ENCOUNTER — Ambulatory Visit: Attending: Cardiology | Admitting: Cardiology

## 2024-07-16 ENCOUNTER — Encounter: Payer: Self-pay | Admitting: Cardiology

## 2024-07-16 VITALS — BP 130/68 | HR 72 | Resp 18 | Ht 70.0 in | Wt 215.0 lb

## 2024-07-16 DIAGNOSIS — D6869 Other thrombophilia: Secondary | ICD-10-CM | POA: Diagnosis not present

## 2024-07-16 DIAGNOSIS — I4821 Permanent atrial fibrillation: Secondary | ICD-10-CM | POA: Diagnosis not present

## 2024-07-16 DIAGNOSIS — R001 Bradycardia, unspecified: Secondary | ICD-10-CM | POA: Diagnosis not present

## 2024-07-16 DIAGNOSIS — Z950 Presence of cardiac pacemaker: Secondary | ICD-10-CM | POA: Diagnosis not present

## 2024-07-16 LAB — CUP PACEART INCLINIC DEVICE CHECK
Battery Remaining Longevity: 103 mo
Battery Voltage: 3.01 V
Brady Statistic RV Percent Paced: 99 %
Date Time Interrogation Session: 20250722143314
Implantable Lead Connection Status: 753985
Implantable Lead Implant Date: 20250421
Implantable Lead Location: 753860
Implantable Pulse Generator Implant Date: 20250421
Lead Channel Impedance Value: 450 Ohm
Lead Channel Pacing Threshold Amplitude: 0.5 V
Lead Channel Pacing Threshold Amplitude: 0.5 V
Lead Channel Pacing Threshold Pulse Width: 0.5 ms
Lead Channel Pacing Threshold Pulse Width: 0.5 ms
Lead Channel Setting Pacing Amplitude: 2 V
Lead Channel Setting Pacing Pulse Width: 0.5 ms
Lead Channel Setting Sensing Sensitivity: 2.5 mV
Pulse Gen Model: 1272
Pulse Gen Serial Number: 8159260

## 2024-07-16 NOTE — Patient Instructions (Signed)
 Medication Instructions:  Your physician recommends that you continue on your current medications as directed. Please refer to the Current Medication list given to you today.   *If you need a refill on your cardiac medications before your next appointment, please call your pharmacy*  Lab Work: None ordered at this time  If you have labs (blood work) drawn today and your tests are completely normal, you will receive your results only by: MyChart Message (if you have MyChart) OR A paper copy in the mail If you have any lab test that is abnormal or we need to change your treatment, we will call you to review the results.  Testing/Procedures: None ordered at this time   Follow-Up: At [facility], you and your health needs are our priority.  As part of our continuing mission to provide you with exceptional heart care, our providers are all part of one team.  This team includes your primary Cardiologist (physician) and Advanced Practice Providers or APPs (Physician Assistants and Nurse Practitioners) who all work together to provide you with the care you need, when you need it.  Your next appointment:   1 year(s)  Provider:   Fonda Kitty, MD or Suzann Riddle, NP    We recommend signing up for the patient portal called MyChart.  Sign up information is provided on this After Visit Summary.  MyChart is used to connect with patients for Virtual Visits (Telemedicine).  Patients are able to view lab/test results, encounter notes, upcoming appointments, etc.  Non-urgent messages can be sent to your provider as well.   To learn more about what you can do with MyChart, go to [email or web address].

## 2024-07-16 NOTE — Telephone Encounter (Signed)
 FYI Only or Action Required?: FYI only for provider.  Patient was last seen in primary care on 09/18/2023 by Gareth Mliss FALCON, FNP.  Called Nurse Triage reporting Foreign Body in Ear.  Symptoms began Unsure of when the object got lodged.  Interventions attempted: Nothing.  Symptoms are: stable.  Triage Disposition: See Physician Within 24 Hours  Patient/caregiver understands and will follow disposition?: Yes   Copied from CRM #[id number]. Topic: Clinical - Red Word Triage >> Jul 16, 2024  3:45 PM Larissa RAMAN wrote: [facility] that prompted transfer to Nurse Triage: foreign object-  rt ear Reason for Disposition  [1] Foreign body AND [2] can't remove at home  Answer Assessment - Initial Assessment Questions 1. OBJECT: What do you think it is?      Its a piece of his hearing aid  2. PAIN: Is it causing any pain? If Yes, ask: How bad is the pain?  (Scale 0-10; or none, mild, moderate, severe)     No pain  3. ONSET: How long do you think it's been in there?     Unsure of how long its been in there. Has been wearing hearing aids for 5 years.  4. DISCHARGE: Has there been any discharge or bleeding from the ear? If Yes, ask: Please describe it. (e.g., clear, cloudy, blood)     No discharge  5. OTHER SYMPTOMS: Do you have any other symptoms? (e.g., decreased hearing, dizziness)     Has hearing loss at baseline  Protocols used: Ear - Foreign Body-A-AH

## 2024-07-17 ENCOUNTER — Ambulatory Visit: Payer: Self-pay | Admitting: Cardiology

## 2024-07-17 ENCOUNTER — Ambulatory Visit (INDEPENDENT_AMBULATORY_CARE_PROVIDER_SITE_OTHER): Admitting: Family Medicine

## 2024-07-17 ENCOUNTER — Encounter: Payer: Self-pay | Admitting: Family Medicine

## 2024-07-17 VITALS — BP 118/74 | HR 75 | Resp 16 | Ht 70.0 in | Wt 213.1 lb

## 2024-07-17 DIAGNOSIS — T161XXA Foreign body in right ear, initial encounter: Secondary | ICD-10-CM

## 2024-07-17 DIAGNOSIS — H903 Sensorineural hearing loss, bilateral: Secondary | ICD-10-CM | POA: Diagnosis not present

## 2024-07-17 NOTE — Progress Notes (Signed)
 Name: Stephen Stein   MRN: [id number]    DOB: 08-11-1941   Date:07/17/2024       Progress Note  Subjective  Chief Complaint  Chief Complaint  Patient presents with   Ear Problem    Possible hearing aid stuff stuck in R ear    Discussed the use of AI scribe software for clinical note transcription with the patient, who gave verbal consent to proceed.  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  He discovered the dome in his right ear yesterday during a visit to [employer] while attempting to get a new pair of hearing aids. He recalls using a Q-tip approximately four months ago, which may have contributed to the dome becoming lodged in his ear canal. Since then, he has experienced occasional 'phantom pain' in the right ear, which he initially attributed to the Q-tip incident.  He has been using hearing aids in both ears, with the left hearing aid currently in place and the right one removed. He did not notice the dome was missing from the hearing aid until it was pointed out to him. Despite the dome being lodged in his ear, he is able to hear adequately from the right ear.  He is planning a trip to [place] next week and wants to resolve the issue before his departure. He wants to hear clearly during his travels, which include a river cruise starting in [place] and ending in [place].    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   Infection of skin due to methicillin resistant Staphylococcus aureus (MRSA) 12/09/2022   Open wound of right hip and thigh with complication 12/09/2022   Osteoarthritis of ankle and foot 12/08/2022   Prediabetes 10/21/2022   RLS (restless legs syndrome) 10/21/2022   Neuropathy 10/21/2022   At high risk for falls 10/21/2022   Atrial  fibrillation (HCC) 09/05/2022   Traumatic blister of ankle 09/05/2022   Allergic rhinitis 10/21/2021   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HCC) 04/20/2021   Parkinson's disease, unspecified whether dyskinesia present, unspecified whether manifestations fluctuate (HCC) 10/20/2020   Carotid atherosclerosis, bilateral 02/20/2019   Vitamin B12 deficiency 10/15/2018   Left bundle branch block 06/25/2018   Second degree AV block, Mobitz type I 06/25/2018   Aortic calcification (HCC) 06/15/2018   Adrenal adenoma 05/09/2017   Pulmonary nodules 04/19/2017   BPH (benign prostatic hyperplasia) 04/19/2017   Pain in left arm 01/20/2017   Coronary artery disease of native artery of native heart with stable angina pectoris (HCC) 03/25/2016   Symptomatic bradycardia 12/16/2015   Hyperlipidemia 07/07/2015   Essential hypertension 07/07/2015    Social History   Tobacco Use   Smoking status: Former    Current packs/day: 0.00    Average packs/day: 1.5 packs/day for 35.0 years (52.5 ttl pk-yrs)    Types: Cigarettes    Start date: 06/22/1957    Quit date: 06/22/1992    Years since quitting: 32.0   Smokeless tobacco: Former    Quit date: 06/22/1992  Substance Use Topics   Alcohol use: Not Currently    Alcohol/week: 1.0 standard drink of alcohol    Types: 1 Cans of beer per week    Comment: rarely     Current Outpatient Medications:  acetaminophen  (TYLENOL ) 500 MG tablet, Take 1,000 mg by mouth every 6 (six) hours as needed for moderate pain., Disp: , Rfl:    ALPHA LIPOIC ACID PO, Take 200 mg by mouth daily., Disp: , Rfl:    apixaban  (ELIQUIS ) 5 MG TABS tablet, Take 1 tablet (5 mg total) by mouth 2 (two) times daily., Disp: 60 tablet, Rfl: 5   cetirizine (ZYRTEC) 10 MG tablet, Take 10 mg by mouth daily., Disp: , Rfl:    donepezil  (ARICEPT  ODT) 5 MG disintegrating tablet, Take 5 mg by mouth daily., Disp: , Rfl:    ketoconazole  (NIZORAL ) 2 % cream, Apply topically to affected areas  qd/bid prn, Disp: 30 g, Rfl: 5   Multiple Vitamin (MULTIVITAMIN WITH MINERALS) TABS tablet, Take 1 tablet by mouth daily., Disp: , Rfl:    rOPINIRole (REQUIP) 1 MG tablet, Take 1 mg by mouth 4 (four) times daily., Disp: , Rfl:    rosuvastatin  (CRESTOR ) 10 MG tablet, TAKE 1 TABLET BY MOUTH EVERY DAY, Disp: 90 tablet, Rfl: 3   sertraline  (ZOLOFT ) 100 MG tablet, Take 100 mg by mouth every morning., Disp: , Rfl:    Iron Combinations (IRON COMPLEX PO), Take 1 capsule by mouth daily. (Patient not taking: Reported on 07/17/2024), Disp: , Rfl:   No Known Allergies  ROS  Ten systems reviewed and is negative except as mentioned in HPI    Objective  Vitals:   07/17/24 0803  BP: 118/74  Pulse: 75  Resp: 16  SpO2: 99%  Weight: 213 lb 1.6 oz (96.7 kg)  Height: 5' 10 (1.778 m)    Body mass index is 30.58 kg/m.  Physical Exam CONSTITUTIONAL: Patient appears well-developed and well-nourished. No distress. HEENT: Head atraumatic, normocephalic, neck supple. Left ear normal. Foreign body in right ear canal.  CARDIOVASCULAR: Normal rate, regular rhythm and normal heart sounds. No murmur heard. No BLE edema. PULMONARY: Effort normal and breath sounds normal. No respiratory distress. PSYCHIATRIC: Patient has a normal mood and affect. Behavior is normal. Judgment and thought content normal.  Recent Results (from the past 2160 hours)  CUP PACEART INCLINIC DEVICE CHECK     Status: None   Collection Time: 05/15/24  3:48 PM  Result Value Ref Range   Date Time Interrogation Session [id number]    Pulse Generator Manufacturer SJ[employer]    Pulse Gen Model 1272 Assurity MRI    Pulse Gen Serial Number [id number]    Clinic Name [facility]    Implantable Pulse Generator Type Implantable Pulse Generator    Implantable Pulse Generator Implant Date 79749578    Implantable Lead Manufacturer OTHER    Implantable Lead Model OEJ8768/41 [facility]    Implantable Lead Serial Number [id number]    Implantable Lead  Implant Date 79749578    Implantable Lead Location Detail 1 UNKNOWN    Implantable Lead Special Function LBBB    Implantable Lead Location [id number]    Implantable Lead Connection Status [id number]    Lead Channel Setting Sensing Sensitivity 2.5 mV   Lead Channel Setting Pacing Pulse Width 0.5 ms   Lead Channel Setting Pacing Amplitude 0.75 V   Lead Channel Impedance Value 450.0 ohm   Lead Channel Sensing Intrinsic Amplitude 7.7 mV   Lead Channel Pacing Threshold Amplitude 0.5 V   Lead Channel Pacing Threshold Pulse Width 0.5 ms   Battery Status Unknown    Battery Remaining Longevity 122 mo   Battery Voltage 3.02 V   Brady Statistic RV Percent Paced 97.0 %   Eval  Rhythm VS 38   CUP PACEART REMOTE DEVICE CHECK     Status: None   Collection Time: 05/29/24  4:00 AM  Result Value Ref Range   Date Time Interrogation Session [id number]    Pulse Generator Manufacturer SJCR    Pulse Gen Model 1272 Assurity MRI    Pulse Gen Serial Number [id number]    Clinic Name [facility]    Implantable Pulse Generator Type Implantable Pulse Generator    Implantable Pulse Generator Implant Date 79749578    Implantable Lead Manufacturer OTHER    Implantable Lead Model OEJ8768/41 [facility]    Implantable Lead Serial Number [id number]    Implantable Lead Implant Date 79749578    Implantable Lead Location Detail 1 UNKNOWN    Implantable Lead Special Function LBBB    Implantable Lead Location [id number]    Implantable Lead Connection Status [id number]    Lead Channel Setting Sensing Sensitivity 2.5 mV   Lead Channel Setting Sensing Adaptation Mode Fixed Pacing    Lead Channel Setting Pacing Pulse Width 0.5 ms   Lead Channel Setting Pacing Amplitude 0.875    Lead Channel Status NULL    Lead Channel Impedance Value 460 ohm   Lead Channel Sensing Intrinsic Amplitude 7.8 mV   Lead Channel Pacing Threshold Amplitude 0.625 V   Lead Channel Pacing Threshold Pulse Width 0.5 ms   Battery Status MOS    Battery Remaining  Longevity 127 mo   Battery Remaining Percentage 95.0 %   Battery Voltage 3.02 V   Brady Statistic RV Percent Paced 99.0 %  CUP PACEART INCLINIC DEVICE CHECK     Status: None   Collection Time: 07/16/24  2:33 PM  Result Value Ref Range   Date Time Interrogation Session [id number]    Pulse Generator Manufacturer SJCR    Pulse Gen Model 1272 Assurity MRI    Pulse Gen Serial Number [id number]    Clinic Name [facility]    Implantable Pulse Generator Type Implantable Pulse Generator    Implantable Pulse Generator Implant Date 79749578    Implantable Lead Manufacturer OTHER    Implantable Lead Model OEJ8768/41 [facility]    Implantable Lead Serial Number [id number]    Implantable Lead Implant Date 79749578    Implantable Lead Location Detail 1 UNKNOWN    Implantable Lead Special Function LBBB    Implantable Lead Location [id number]    Implantable Lead Connection Status [id number]    Lead Channel Setting Sensing Sensitivity 2.5 mV   Lead Channel Setting Pacing Pulse Width 0.5 ms   Lead Channel Setting Pacing Amplitude 2.0 V   Lead Channel Impedance Value 450.0 ohm   Lead Channel Pacing Threshold Amplitude 0.5 V   Lead Channel Pacing Threshold Pulse Width 0.5 ms   Lead Channel Pacing Threshold Amplitude 0.5 V   Lead Channel Pacing Threshold Pulse Width 0.5 ms   Battery Status Unknown    Battery Remaining Longevity 103 mo   Battery Voltage 3.01 V   Brady Statistic RV Percent Paced 99.0 %    Assessment & Plan Foreign body in right external ear canal  Hearing aid dome lodged in right ear canal for four months, causing occasional phantom pain without hearing impairment. Ear lavage failed to remove it.  Consent form signed  Possible side effects discussed with patient Right ear lavage was done  with warm water  and peroxide  Patient tolerated procedure well No complications    - Refer to ENT for removal of dome.

## 2024-07-18 NOTE — Progress Notes (Signed)
 Remote pacemaker transmission.

## 2024-08-12 ENCOUNTER — Other Ambulatory Visit: Payer: Self-pay | Admitting: Family Medicine

## 2024-08-12 DIAGNOSIS — E782 Mixed hyperlipidemia: Secondary | ICD-10-CM

## 2024-08-13 NOTE — Telephone Encounter (Signed)
 Requested medications are due for refill today.  yes  Requested medications are on the active medications list.  yes  Last refill. 08/14/2023 #90 3 rf  Future visit scheduled.   no  Notes to clinic.  Labs are expired.    Requested Prescriptions  Pending Prescriptions Disp Refills   rosuvastatin  (CRESTOR ) 10 MG tablet [Pharmacy Med Name: ROSUVASTATIN  CALCIUM  10 MG TAB] 90 tablet 3    Sig: TAKE 1 TABLET BY MOUTH EVERY DAY     Cardiovascular:  Antilipid - Statins 2 Failed - 08/13/2024  5:45 PM      Failed - Valid encounter within last 12 months    Recent Outpatient Visits           3 weeks ago Foreign body of right ear, initial encounter   [facility] Glenard Mire, MD       Future Appointments             In 2 months Hester Alm BROCKS, MD [facility]            Failed - Lipid Panel in normal range within the last 12 months    Cholesterol, Total  Date Value Ref Range Status  06/01/2016 110 100 - 199 mg/dL Final   Cholesterol  Date Value Ref Range Status  04/21/2023 116 <200 mg/dL Final   LDL Cholesterol (Calc)  Date Value Ref Range Status  04/21/2023 55 mg/dL (calc) Final    Comment:    Reference range: <100 . Desirable range <100 mg/dL for primary prevention;   <70 mg/dL for patients with CHD or diabetic patients  with > or = 2 CHD risk factors. SABRA LDL-C is now calculated using the Martin-Hopkins  calculation, which is a validated novel method providing  better accuracy than the Friedewald equation in the  estimation of LDL-C.  Gladis APPLETHWAITE et al. SANDREA. 7986;689(80): 2061-2068  (http://education.[facility].com/faq/FAQ164)    HDL  Date Value Ref Range Status  04/21/2023 44 > OR = 40 mg/dL Final  93/92/7982 39 (L) >39 mg/dL Final   Triglycerides  Date Value Ref Range Status  04/21/2023 89 <150 mg/dL Final         Passed - Cr in normal range and within 360 days    Creat  Date Value Ref Range  Status  04/21/2023 1.06 0.70 - 1.22 mg/dL Final   Creatinine, Ser  Date Value Ref Range Status  03/26/2024 1.20 0.76 - 1.27 mg/dL Final         Passed - Patient is not pregnant

## 2024-08-20 ENCOUNTER — Ambulatory Visit: Admitting: Family Medicine

## 2024-08-20 DIAGNOSIS — Z1283 Encounter for screening for malignant neoplasm of skin: Secondary | ICD-10-CM | POA: Diagnosis not present

## 2024-08-20 DIAGNOSIS — L820 Inflamed seborrheic keratosis: Secondary | ICD-10-CM | POA: Diagnosis not present

## 2024-08-20 DIAGNOSIS — L814 Other melanin hyperpigmentation: Secondary | ICD-10-CM | POA: Diagnosis not present

## 2024-08-20 DIAGNOSIS — D229 Melanocytic nevi, unspecified: Secondary | ICD-10-CM | POA: Diagnosis not present

## 2024-08-20 DIAGNOSIS — Z85828 Personal history of other malignant neoplasm of skin: Secondary | ICD-10-CM | POA: Diagnosis not present

## 2024-08-20 DIAGNOSIS — L821 Other seborrheic keratosis: Secondary | ICD-10-CM | POA: Diagnosis not present

## 2024-08-23 ENCOUNTER — Ambulatory Visit

## 2024-08-23 VITALS — BP 118/74 | Ht 70.0 in | Wt 210.0 lb

## 2024-08-23 DIAGNOSIS — Z0000 Encounter for general adult medical examination without abnormal findings: Secondary | ICD-10-CM | POA: Diagnosis not present

## 2024-08-23 NOTE — Patient Instructions (Signed)
 Stephen Stein , Thank you for taking time out of your busy schedule to complete your Annual Wellness Visit with me. I enjoyed our conversation and look forward to speaking with you again next year. I, as well as your care team,  appreciate your ongoing commitment to your health goals. Please review the following plan we discussed and let me know if I can assist you in the future. Your Game plan/ To Do List    Referrals: If you haven't heard from the office you've been referred to, please reach out to them at the phone provided.   Follow up Visits: We will see or speak with you next year for your Next Medicare AWV with our clinical staff Have you seen your provider in the last 6 months (3 months if uncontrolled diabetes)? No  Clinician Recommendations:  Aim for 30 minutes of exercise or brisk walking, 6-8 glasses of water , and 5 servings of fruits and vegetables each day.       This is a list of the screenings recommended for you:  Health Maintenance  Topic Date Due   Zoster (Shingles) Vaccine (2 of 2) 03/18/2021   COVID-19 Vaccine (6 - Moderna risk 2024-25 season) 03/07/2024   Flu Shot  07/26/2024   Medicare Annual Wellness Visit  08/23/2025   Pneumococcal Vaccine for age over 74  Completed   HPV Vaccine  Aged Out   Meningitis B Vaccine  Aged Out   DTaP/Tdap/Td vaccine  Discontinued   Hepatitis C Screening  Discontinued    Advanced directives: (In Chart) A copy of your advanced directives are scanned into your chart should your provider ever need it. Advance Care Planning is important because it:  [x]  Makes sure you receive the medical care that is consistent with your values, goals, and preferences  [x]  It provides guidance to your family and loved ones and reduces their decisional burden about whether or not they are making the right decisions based on your wishes.  Follow the link provided in your after visit summary or read over the paperwork we have mailed to you to help you  started getting your Advance Directives in place. If you need assistance in completing these, please reach out to us  so that we can help you!  See attachments for Preventive Care and Fall Prevention Tips.

## 2024-08-23 NOTE — Progress Notes (Signed)
 Because this visit was a virtual/telehealth visit,  certain criteria was not obtained, such a blood pressure, CBG if applicable, and timed get up and go. Any medications not marked as taking were not mentioned during the medication reconciliation part of the visit. Any vitals not documented were not able to be obtained due to this being a telehealth visit or patient was unable to self-report a recent blood pressure reading due to a lack of equipment at home via telehealth. Vitals that have been documented are verbally provided by the patient.  This visit was performed by a medical professional under my direct supervision. I was immediately available for consultation/collaboration. I have reviewed and agree with the Annual Wellness Visit documentation.  Subjective:   Stephen Stein is a 83 y.o. who presents for a Medicare Wellness preventive visit.  As a reminder, Annual Wellness Visits don't include a physical exam, and some assessments may be limited, especially if this visit is performed virtually. We may recommend an in-person follow-up visit with your provider if needed.  Visit Complete: Virtual I connected with  Stephen Stein on 08/23/24 by a audio enabled telemedicine application and verified that I am speaking with the correct person using two identifiers.  Patient Location: Home  Provider Location: Home Office  I discussed the limitations of evaluation and management by telemedicine. The patient expressed understanding and agreed to proceed.  Vital Signs: Because this visit was a virtual/telehealth visit, some criteria may be missing or patient reported. Any vitals not documented were not able to be obtained and vitals that have been documented are patient reported.  VideoDeclined- This patient declined Librarian, academic. Therefore the visit was completed with audio only.  Persons Participating in Visit: Patient.  AWV Questionnaire: No: Patient Medicare  AWV questionnaire was not completed prior to this visit.  Cardiac Risk Factors include: advanced age (>24men, >98 women);male gender;obesity (BMI >30kg/m2);hypertension;Other (see comment), Risk factor comments: a fib     Objective:    Today's Vitals   08/23/24 1019  BP: 118/74  Weight: 210 lb (95.3 kg)  Height: 5' 10 (1.778 m)   Body mass index is 30.13 kg/m.     08/23/2024   10:25 AM 04/15/2024   11:00 AM 09/28/2023    9:35 AM 09/26/2023    3:20 PM 07/13/2023   10:13 AM 07/05/2023    9:05 AM 03/31/2023   10:26 AM  Advanced Directives  Does Patient Have a Medical Advance Directive? Yes Yes Yes Yes Yes Yes Yes  Type of Estate agent of [place];Living will Living will Healthcare Power of [employer] Power of [employer] Power of [place];Living will  Healthcare Power of [place];Living will  Does patient want to make changes to medical advance directive? No - Patient declined No - Patient declined No - Patient declined  No - Patient declined  No - Patient declined  Copy of Healthcare Power of Attorney in Chart? Yes - validated most recent copy scanned in chart (See row information)  Yes - validated most recent copy scanned in chart (See row information) Yes - validated most recent copy scanned in chart (See row information) No - copy requested  Yes - validated most recent copy scanned in chart (See row information)    Current Medications (verified) Outpatient Encounter Medications as of 08/23/2024  Medication Sig   acetaminophen  (TYLENOL ) 500 MG tablet Take 1,000 mg by mouth every 6 (six) hours as needed for moderate pain.   ALPHA LIPOIC ACID  PO Take 200 mg by mouth daily.   apixaban  (ELIQUIS ) 5 MG TABS tablet Take 1 tablet (5 mg total) by mouth 2 (two) times daily.   cetirizine (ZYRTEC) 10 MG tablet Take 10 mg by mouth daily.   donepezil  (ARICEPT  ODT) 5 MG disintegrating tablet Take 5 mg by mouth daily.   Iron Combinations (IRON COMPLEX PO) Take 1  capsule by mouth daily.   ketoconazole  (NIZORAL ) 2 % cream Apply topically to affected areas qd/bid prn   Multiple Vitamin (MULTIVITAMIN WITH MINERALS) TABS tablet Take 1 tablet by mouth daily.   rOPINIRole (REQUIP) 1 MG tablet Take 1 mg by mouth 4 (four) times daily.   rosuvastatin  (CRESTOR ) 10 MG tablet TAKE 1 TABLET BY MOUTH EVERY DAY   sertraline  (ZOLOFT ) 100 MG tablet Take 100 mg by mouth every morning.   No facility-administered encounter medications on file as of 08/23/2024.    Allergies (verified) Patient has no known allergies.   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 08/2018 s/p lap chole.   BPH (benign prostatic hyperplasia)    Carotid atherosclerosis, bilateral    a. 01/2019 Carotid U/S: <50% bilat ICA stenosis.   Childhood asthma    Constipation 10/21/2021   Coronary artery calcification seen on CT scan    a.) cCTA 12/16/2015: Ca2+ 892 (80th percentile for age/sex/race matched control). Diffuse Ca2+ in all 3 major coronary arteries; b.) 02/2016 MV: HTN response to exercise. Abnl ecg w/ horizontal inflat ST depression, but no ischemia/infarct on imaging; c.) 09/2018 MV: EF 64%, no ischemia. Very sm region of mild distal antsept defect, likely 2/2 LBBB   Depression    Diastolic dysfunction 10/09/2018   a.) TTE 10/09/2018: EF 45-50%, diff HK, mod LVH, mild reduced RVSF, triv AR, G1DD; b.) TTE 07/15/2022: EF 45-50%. mild LVH, mild LA dil, mild MR/AR, Ao root 42 mm   Hepatic steatosis    History of bilateral cataract extraction 11/2020   History of methicillin resistant  staphylococcus aureus (MRSA) 12/01/2022   a.) RIGHT thigh wound   Hx of basal cell carcinoma 08/01/2019   R chest parasternal   Hyperlipidemia    Hypertension    Irregular heartbeat/Palpitations    a. Has been taking Atenolol  for 20 years   LBBB (left bundle branch block)    Lightheadedness    Long term current use of anticoagulant    a.) apixaban    Mixed Alzheimer's and vascular dementia (HCC)    a.) on NMDA inhibitor (memantine)   Neuropathy    NSVT (nonsustained ventricular tachycardia) (HCC)    a. 11/2018 Zio monitor - 13 beats of NSVT--asymptomatic.   Orthostatic hypotension    a. 11/2018 pressures improved w/ midodrine /florinef .   Parkinson's disease (HCC)    Pre-diabetes    Pulmonary nodules    RLS (restless legs syndrome)    a.) on ropinirole   Sebaceous cyst of scrotum    Second degree AV block, Mobitz type I    Sinus bradycardia    a. 11/2018 Zio monitor: Avg HR 57 (  min 28). Intermittent Mobitz I.   Wears dentures    Partial upper   Wears hearing aid in both ears    Past Surgical History:  Procedure Laterality Date   ANKLE FRACTURE SURGERY Left 09/28/2022   [facility]   CATARACT EXTRACTION W/PHACO Left 12/09/2020   Procedure: CATARACT EXTRACTION PHACO AND INTRAOCULAR LENS PLACEMENT (IOC) LEFT 6.94 01:09.7 9.9% ;  Surgeon: Mittie Gaskin, MD;  Location: [facility];  Service: Ophthalmology;  Laterality: Left;   CATARACT EXTRACTION W/PHACO Right 12/23/2020   Procedure: CATARACT EXTRACTION PHACO AND INTRAOCULAR LENS PLACEMENT (IOC) RIGHT MALYUGIN;  Surgeon: Mittie Gaskin, MD;  Location: [facility];  Service: Ophthalmology;  Laterality: Right;  8.44 1:14.6 11.3%   CHOLECYSTECTOMY N/A 09/13/2018   Procedure: LAPAROSCOPIC CHOLECYSTECTOMY;  Surgeon: Jordis Laneta FALCON, MD;  Location: [facility];  Service: General;  Laterality: N/A;   COLONOSCOPY  12/26/2006   CYSTOSCOPY WITH INSERTION OF UROLIFT N/A 09/28/2023   Procedure: CYSTOSCOPY WITH INSERTION OF  UROLIFT (6);  Surgeon: Penne Knee, MD;  Location: [facility];  Service: Urology;  Laterality: N/A;   ESOPHAGOGASTRODUODENOSCOPY (EGD) WITH PROPOFOL  N/A 08/20/2018   Procedure: ESOPHAGOGASTRODUODENOSCOPY (EGD) WITH PROPOFOL ;  Surgeon: Jinny Carmine, MD;  Location: [facility];  Service: Endoscopy;  Laterality: N/A;   EXCISION MASS HEAD Right 03/31/2023   Procedure: EXCISION LESION AURICLE;  Surgeon: Edda Mt, MD;  Location: [facility];  Service: ENT;  Laterality: Right;   PACEMAKER IMPLANT N/A 04/15/2024   Procedure: PACEMAKER IMPLANT;  Surgeon: Kennyth Chew, MD;  Location: [facility];  Service: Cardiovascular;  Laterality: N/A;   SCROTAL EXPLORATION N/A 07/13/2023   Procedure: EXCISION OF SCROTUM MASS;  Surgeon: Penne Knee, MD;  Location: [facility];  Service: Urology;  Laterality: N/A;   TONSILLECTOMY AND ADENOIDECTOMY  12/26/1953   TOTAL SHOULDER ARTHROPLASTY Left 09/11/2019   Procedure: TOTAL SHOULDER ARTHROPLASTY;  Surgeon: Leora Lynwood SAUNDERS, MD;  Location: [facility];  Service: Orthopedics;  Laterality: Left;   TOTAL SHOULDER ARTHROPLASTY Right 08/02/2021   Procedure: TOTAL SHOULDER ARTHROPLASTY;  Surgeon: Leora Lynwood SAUNDERS, MD;  Location: [facility];  Service: Orthopedics;  Laterality: Right;   Family History  Problem Relation Age of Onset   Cancer Mother        lung   Heart disease Father    Hearing loss Sister        coclear implant   Hearing loss Brother    Cancer Paternal Grandmother    Cancer Son        colon cancer   Colon cancer Neg Hx    Stomach cancer Neg Hx    Social History   Socioeconomic History   Marital status: Married    Spouse name: Inocente   Number of children: 2   Years of education: Not on file   Highest education level: Bachelor's degree (e.g., BA, AB, BS)  Occupational History   Not on file  Tobacco Use   Smoking status: Former    Current packs/day: 0.00    Average packs/day: 1.5 packs/day for 35.0 years (52.5 ttl pk-yrs)     Types: Cigarettes    Start date: 06/22/1957    Quit date: 06/22/1992    Years since quitting: 32.1   Smokeless tobacco: Former    Quit date: 06/22/1992  Vaping Use   Vaping status: Never Used  Substance and Sexual Activity   Alcohol use: Not Currently    Alcohol/week: 1.0 standard drink of alcohol    Types: 1 Cans of beer per  week    Comment: rarely   Drug use: No   Sexual activity: Not Currently  Other Topics Concern   Not on file  Social History Narrative   Not on file   Social Drivers of Health   Financial Resource Strain: Low Risk  (08/23/2024)   Overall Financial Resource Strain (CARDIA)    Difficulty of Paying Living Expenses: Not hard at all  Food Insecurity: No Food Insecurity (08/23/2024)   Hunger Vital Sign    Worried About Running Out of Food in the Last Year: Never true    Ran Out of Food in the Last Year: Never true  Transportation Needs: No Transportation Needs (08/23/2024)   PRAPARE - Administrator, Civil Service (Medical): No    Lack of Transportation (Non-Medical): No  Physical Activity: Sufficiently Active (08/23/2024)   Exercise Vital Sign    Days of Exercise per Week: 5 days    Minutes of Exercise per Session: 30 min  Stress: No Stress Concern Present (08/23/2024)   [employer] of Occupational Health - Occupational Stress Questionnaire    Feeling of Stress: Not at all  Social Connections: Moderately Integrated (08/23/2024)   Social Connection and Isolation Panel    Frequency of Communication with Friends and Family: More than three times a week    Frequency of Social Gatherings with Friends and Family: Three times a week    Attends Religious Services: More than 4 times per year    Active Member of Clubs or Organizations: No    Attends Banker Meetings: Never    Marital Status: Married    Tobacco Counseling Counseling given: Not Answered    Clinical Intake:  Pre-visit preparation completed: Yes  Pain : No/denies  pain     BMI - recorded: 30.13 Nutritional Status: BMI > 30  Obese Nutritional Risks: None Diabetes: No  Lab Results  Component Value Date   HGBA1C 6.1 (H) 04/21/2023   HGBA1C 5.8 (H) 10/21/2022   HGBA1C 5.6 04/20/2021     How often do you need to have someone help you when you read instructions, pamphlets, or other written materials from your doctor or pharmacy?: 1 - Never  Interpreter Needed?: No  Information entered by :: Nyiesha Beever,CMA   Activities of Daily Living     08/23/2024   10:24 AM 09/28/2023    9:34 AM  In your present state of health, do you have any difficulty performing the following activities:  Hearing? 0 1  Comment  bilateral hearing aides  Vision? 0 0  Difficulty concentrating or making decisions? 1 0  Walking or climbing stairs? 1   Dressing or bathing? 0   Doing errands, shopping? 0   Preparing Food and eating ? N   Using the Toilet? N   In the past six months, have you accidently leaked urine? Y   Do you have problems with loss of bowel control? Y   Managing your Medications? N   Managing your Finances? N   Housekeeping or managing your Housekeeping? N     Patient Care Team: Leavy Mole, PA-C as PCP - General (Family Medicine) Perla Evalene PARAS, MD as PCP - Cardiology (Cardiology) Kennyth Chew, MD as PCP - Electrophysiology (Cardiology) Perla Evalene PARAS, MD as Consulting Physician (Cardiology) Maree Jannett POUR, MD as Consulting Physician (Neurology) Hester Alm BROCKS, MD (Dermatology) Riddle, Suzann, NP as Nurse Practitioner (Clinical Cardiac Electrophysiology)  I have updated your Care Teams any recent Medical Services you may have  received from other providers in the past year.     Assessment:   This is a routine wellness examination for [place].  Hearing/Vision screen Hearing Screening - Comments:: Patient wears hearing aids  Vision Screening - Comments:: Patient wears glasses    Goals Addressed             This  Visit's Progress    Patient Stated       Patient wants to go on another cruise        Depression Screen     08/23/2024   10:26 AM 07/17/2024    7:56 AM 09/18/2023    1:56 PM 04/21/2023    9:29 AM 03/13/2023    9:38 AM 12/07/2022    9:35 AM 12/06/2022   10:04 AM  PHQ 2/9 Scores  PHQ - 2 Score 0 0 0 0 0 0 0  PHQ- 9 Score 1 0  0 0 0 0    Fall Risk     08/23/2024   10:25 AM 07/17/2024    7:55 AM 09/18/2023    1:56 PM 04/21/2023    9:29 AM 03/13/2023    9:38 AM  Fall Risk   Falls in the past year? 1 0 0 1 0  Number falls in past yr: 1 0 0 1 0  Injury with Fall? 1 0 0 0 0  Risk for fall due to : History of fall(s);Impaired balance/gait;Impaired mobility No Fall Risks No Fall Risks Impaired balance/gait;History of fall(s) No Fall Risks  Follow up Falls evaluation completed Falls evaluation completed Falls prevention discussed Falls prevention discussed;Education provided;Falls evaluation completed Falls prevention discussed;Education provided;Falls evaluation completed    MEDICARE RISK AT HOME:  Medicare Risk at Home Any stairs in or around the home?: Yes If so, are there any without handrails?: No Home free of loose throw rugs in walkways, pet beds, electrical cords, etc?: Yes Adequate lighting in your home to reduce risk of falls?: Yes Life alert?: No Use of a cane, walker or w/c?: No Grab bars in the bathroom?: Yes Shower chair or bench in shower?: No Elevated toilet seat or a handicapped toilet?: Yes  TIMED UP AND GO:  Was the test performed?  No  Cognitive Function: 6CIT completed    04/22/2022   10:33 AM  MMSE - Mini Mental State Exam  Orientation to time 5  Orientation to Place 5  Registration 3  Attention/ Calculation 5  Recall 1  Language- name 2 objects 2  Language- repeat 1  Language- follow 3 step command 3  Language- read & follow direction 1  Write a sentence 1  Copy design 1  Total score 28        08/23/2024   10:22 AM 12/07/2022   10:35 AM  07/04/2019    8:34 AM 10/12/2018    8:30 AM  6CIT Screen  What Year? 0 points 0 points 0 points 4 points  What month? 0 points 0 points 0 points 0 points  What time? 0 points 0 points 0 points 0 points  Count back from 20 0 points 0 points 0 points 0 points  Months in reverse 0 points 4 points 2 points 4 points  Repeat phrase 0 points 0 points 2 points 4 points  Total Score 0 points 4 points 4 points 12 points    Immunizations Immunization History  Administered Date(s) Administered   INFLUENZA, HIGH DOSE SEASONAL PF 09/16/2017, 09/24/2018, 08/29/2019, 09/29/2020, 09/22/2021, 09/20/2022   Influenza-Unspecified 08/26/2014, 09/22/2015  Moderna Covid-19 Vaccine Bivalent Booster 96yrs & up 10/04/2021   Moderna Sars-Covid-2 Vaccination 01/07/2020, 02/22/2020, 10/19/2020   Pfizer(Comirnaty)Fall Seasonal Vaccine 12 years and older 09/08/2023   Pneumococcal Conjugate-13 11/24/2014, 09/22/2015   Pneumococcal Polysaccharide-23 09/24/2018   Tdap 06/21/2011   Zoster Recombinant(Shingrix) 01/21/2021    Screening Tests Health Maintenance  Topic Date Due   Zoster Vaccines- Shingrix (2 of 2) 03/18/2021   COVID-19 Vaccine (6 - Moderna risk 2024-25 season) 03/07/2024   INFLUENZA VACCINE  07/26/2024   Medicare Annual Wellness (AWV)  08/23/2025   Pneumococcal Vaccine: 50+ Years  Completed   HPV VACCINES  Aged Out   Meningococcal B Vaccine  Aged Out   DTaP/Tdap/Td  Discontinued   Hepatitis C Screening  Discontinued    Health Maintenance  Health Maintenance Due  Topic Date Due   Zoster Vaccines- Shingrix (2 of 2) 03/18/2021   COVID-19 Vaccine (6 - Moderna risk 2024-25 season) 03/07/2024   INFLUENZA VACCINE  07/26/2024   Health Maintenance Items Addressed:   Additional Screening:  Vision Screening: Recommended annual ophthalmology exams for early detection of glaucoma and other disorders of the eye. Would you like a referral to an eye doctor? No    Dental Screening: Recommended annual  dental exams for proper oral hygiene  Community Resource Referral / Chronic Care Management: CRR required this visit?  No   CCM required this visit?  No   Plan:    I have personally reviewed and noted the following in the patient's chart:   Medical and social history Use of alcohol, tobacco or illicit drugs  Current medications and supplements including opioid prescriptions. Patient is not currently taking opioid prescriptions. Functional ability and status Nutritional status Physical activity Advanced directives List of other physicians Hospitalizations, surgeries, and ER visits in previous 12 months Vitals Screenings to include cognitive, depression, and falls Referrals and appointments  In addition, I have reviewed and discussed with patient certain preventive protocols, quality metrics, and best practice recommendations. A written personalized care plan for preventive services as well as general preventive health recommendations were provided to patient.   Lyle MARLA Right, [place]   08/23/2024   After Visit Summary: (MyChart) Due to this being a telephonic visit, the after visit summary with patients personalized plan was offered to patient via MyChart   Notes: Nothing significant to report at this time.

## 2024-08-28 ENCOUNTER — Ambulatory Visit (INDEPENDENT_AMBULATORY_CARE_PROVIDER_SITE_OTHER)

## 2024-08-28 DIAGNOSIS — I4821 Permanent atrial fibrillation: Secondary | ICD-10-CM

## 2024-08-29 LAB — CUP PACEART REMOTE DEVICE CHECK
Battery Remaining Longevity: 105 mo
Battery Remaining Percentage: 93 %
Battery Voltage: 3.01 V
Brady Statistic RV Percent Paced: 99 %
Date Time Interrogation Session: 20250903111654
Implantable Lead Connection Status: 753985
Implantable Lead Implant Date: 20250421
Implantable Lead Location: 753860
Implantable Pulse Generator Implant Date: 20250421
Lead Channel Impedance Value: 430 Ohm
Lead Channel Pacing Threshold Amplitude: 0.5 V
Lead Channel Pacing Threshold Pulse Width: 0.5 ms
Lead Channel Sensing Intrinsic Amplitude: 9.4 mV
Lead Channel Setting Pacing Amplitude: 2 V
Lead Channel Setting Pacing Pulse Width: 0.5 ms
Lead Channel Setting Sensing Sensitivity: 2.5 mV
Pulse Gen Model: 1272
Pulse Gen Serial Number: 8159260

## 2024-09-01 ENCOUNTER — Ambulatory Visit: Payer: Self-pay | Admitting: Cardiology

## 2024-09-02 ENCOUNTER — Ambulatory Visit (INDEPENDENT_AMBULATORY_CARE_PROVIDER_SITE_OTHER): Admitting: Family Medicine

## 2024-09-02 ENCOUNTER — Encounter: Payer: Self-pay | Admitting: Family Medicine

## 2024-09-02 VITALS — BP 120/72 | HR 86 | Resp 16 | Ht 70.0 in | Wt 217.0 lb

## 2024-09-02 DIAGNOSIS — Z5181 Encounter for therapeutic drug level monitoring: Secondary | ICD-10-CM

## 2024-09-02 DIAGNOSIS — R053 Chronic cough: Secondary | ICD-10-CM | POA: Diagnosis not present

## 2024-09-02 DIAGNOSIS — E782 Mixed hyperlipidemia: Secondary | ICD-10-CM

## 2024-09-02 LAB — LIPID PANEL
Cholesterol: 117 mg/dL (ref <200)
HDL: 37 mg/dL — ABNORMAL LOW (ref >=40)
LDL Cholesterol (Calc): 58 mg/dL
Non-HDL Cholesterol (Calc): 80 mg/dL (ref <130)
Total CHOL/HDL Ratio: 3.2 (calc) (ref <5.0)
Triglycerides: 140 mg/dL (ref <150)

## 2024-09-02 LAB — COMPREHENSIVE METABOLIC PANEL WITH GFR
AG Ratio: 1.5 (calc) (ref 1.0–2.5)
ALT: 23 U/L (ref 9–46)
AST: 25 U/L (ref 10–35)
Albumin: 4.1 g/dL (ref 3.6–5.1)
Alkaline phosphatase (APISO): 37 U/L (ref 35–144)
BUN/Creatinine Ratio: 23 (calc) — ABNORMAL HIGH (ref 6–22)
BUN: 26 mg/dL — ABNORMAL HIGH (ref 7–25)
CO2: 27 mmol/L (ref 20–32)
Calcium: 9.5 mg/dL (ref 8.6–10.3)
Chloride: 105 mmol/L (ref 98–110)
Creat: 1.11 mg/dL (ref 0.70–1.22)
Globulin: 2.8 g/dL (ref 1.9–3.7)
Glucose, Bld: 81 mg/dL (ref 65–99)
Potassium: 5.1 mmol/L (ref 3.5–5.3)
Sodium: 140 mmol/L (ref 135–146)
Total Bilirubin: 0.3 mg/dL (ref 0.2–1.2)
Total Protein: 6.9 g/dL (ref 6.1–8.1)
eGFR: 66 mL/min/1.73m2 (ref >=60)

## 2024-09-02 MED ORDER — ROSUVASTATIN CALCIUM 10 MG PO TABS
10.0000 mg | ORAL_TABLET | Freq: Every day | ORAL | 3 refills | Status: AC
Start: 2024-09-02 — End: ?

## 2024-09-02 NOTE — Progress Notes (Signed)
 Name: Stephen Stein   MRN: [id number]    DOB: July 20, 1941   Date:09/02/2024       Progress Note  Chief Complaint  Patient presents with   Medication Refill   Hyperlipidemia     Subjective:   Stephen Stein is a 83 y.o. male, presents to clinic for routine follow up on chronic conditions  Lab Results  Component Value Date   CHOL 116 04/21/2023   HDL 44 04/21/2023   LDLCALC 55 04/21/2023   TRIG 89 04/21/2023   CHOLHDL 2.6 04/21/2023  On crestor  10 mg Overdue for annual monitoring labs and needs meds refilled  Cough that is rare, intermittent and productive no CP or SOB he asks about it     Current Outpatient Medications:    acetaminophen  (TYLENOL ) 500 MG tablet, Take 1,000 mg by mouth every 6 (six) hours as needed for moderate pain., Disp: , Rfl:    ALPHA LIPOIC ACID PO, Take 200 mg by mouth daily., Disp: , Rfl:    apixaban  (ELIQUIS ) 5 MG TABS tablet, Take 1 tablet (5 mg total) by mouth 2 (two) times daily., Disp: 60 tablet, Rfl: 5   cetirizine (ZYRTEC) 10 MG tablet, Take 10 mg by mouth daily., Disp: , Rfl:    donepezil  (ARICEPT  ODT) 5 MG disintegrating tablet, Take 5 mg by mouth daily., Disp: , Rfl:    Iron Combinations (IRON COMPLEX PO), Take 1 capsule by mouth daily., Disp: , Rfl:    ketoconazole  (NIZORAL ) 2 % cream, Apply topically to affected areas qd/bid prn, Disp: 30 g, Rfl: 5   Multiple Vitamin (MULTIVITAMIN WITH MINERALS) TABS tablet, Take 1 tablet by mouth daily., Disp: , Rfl:    rOPINIRole (REQUIP) 1 MG tablet, Take 1 mg by mouth 4 (four) times daily., Disp: , Rfl:    sertraline  (ZOLOFT ) 100 MG tablet, Take 100 mg by mouth every morning., Disp: , Rfl:    rosuvastatin  (CRESTOR ) 10 MG tablet, Take 1 tablet (10 mg total) by mouth daily., Disp: 90 tablet, Rfl: 3  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   Infection of skin due to  methicillin resistant Staphylococcus aureus (MRSA) 12/09/2022   Open wound of right hip and thigh with complication 12/09/2022   Osteoarthritis of ankle and foot 12/08/2022   Prediabetes 10/21/2022   RLS (restless legs syndrome) 10/21/2022   Neuropathy 10/21/2022   At high risk for falls 10/21/2022   Atrial fibrillation (HCC) 09/05/2022   Traumatic blister of ankle 09/05/2022   Allergic rhinitis 10/21/2021   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HCC) 04/20/2021   Parkinson's disease, unspecified whether dyskinesia present, unspecified whether manifestations fluctuate (HCC) 10/20/2020   Carotid atherosclerosis, bilateral 02/20/2019   Vitamin B12 deficiency 10/15/2018   Left bundle branch block 06/25/2018   Second degree AV block, Mobitz type I 06/25/2018   Aortic calcification (HCC) 06/15/2018   Adrenal adenoma 05/09/2017   Pulmonary nodules 04/19/2017   BPH (benign prostatic hyperplasia) 04/19/2017   Pain in left arm 01/20/2017   Coronary artery disease of native artery of native heart with stable angina pectoris (HCC) 03/25/2016   Symptomatic bradycardia 12/16/2015   Hyperlipidemia 07/07/2015   Essential hypertension 07/07/2015    Past Surgical History:  Procedure Laterality Date   ANKLE FRACTURE SURGERY Left 09/28/2022   [facility]  CATARACT EXTRACTION W/PHACO Left 12/09/2020   Procedure: CATARACT EXTRACTION PHACO AND INTRAOCULAR LENS PLACEMENT (IOC) LEFT 6.94 01:09.7 9.9% ;  Surgeon: Mittie Gaskin, MD;  Location: [facility];  Service: Ophthalmology;  Laterality: Left;   CATARACT EXTRACTION W/PHACO Right 12/23/2020   Procedure: CATARACT EXTRACTION PHACO AND INTRAOCULAR LENS PLACEMENT (IOC) RIGHT MALYUGIN;  Surgeon: Mittie Gaskin, MD;  Location: [facility];  Service: Ophthalmology;  Laterality: Right;  8.44 1:14.6 11.3%   CHOLECYSTECTOMY N/A 09/13/2018   Procedure: LAPAROSCOPIC  CHOLECYSTECTOMY;  Surgeon: Jordis Laneta FALCON, MD;  Location: [facility] OR[facility];  Service: General;  Laterality: N/A;   COLONOSCOPY  12/26/2006   CYSTOSCOPY WITH INSERTION OF UROLIFT N/A 09/28/2023   Procedure: CYSTOSCOPY WITH INSERTION OF UROLIFT (6);  Surgeon: Penne Knee, MD;  Location: [facility];  Service: Urology;  Laterality: N/A;   ESOPHAGOGASTRODUODENOSCOPY (EGD) WITH PROPOFOL  N/A 08/20/2018   Procedure: ESOPHAGOGASTRODUODENOSCOPY (EGD) WITH PROPOFOL ;  Surgeon: Jinny Carmine, MD;  Location: [facility];  Service: Endoscopy;  Laterality: N/A;   EXCISION MASS HEAD Right 03/31/2023   Procedure: EXCISION LESION AURICLE;  Surgeon: Edda Mt, MD;  Location: [facility];  Service: ENT;  Laterality: Right;   PACEMAKER IMPLANT N/A 04/15/2024   Procedure: PACEMAKER IMPLANT;  Surgeon: Kennyth Chew, MD;  Location: [facility] INVAS[facility] CV LAB;  Service: Cardiovascular;  Laterality: N/A;   SCROTAL EXPLORATION N/A 07/13/2023   Procedure: EXCISION OF SCROTUM MASS;  Surgeon: Penne Knee, MD;  Location: [facility];  Service: Urology;  Laterality: N/A;   TONSILLECTOMY AND ADENOIDECTOMY  12/26/1953   TOTAL SHOULDER ARTHROPLASTY Left 09/11/2019   Procedure: TOTAL SHOULDER ARTHROPLASTY;  Surgeon: Leora Lynwood SAUNDERS, MD;  Location: [facility];  Service: Orthopedics;  Laterality: Left;   TOTAL SHOULDER ARTHROPLASTY Right 08/02/2021   Procedure: TOTAL SHOULDER ARTHROPLASTY;  Surgeon: Leora Lynwood SAUNDERS, MD;  Location: [facility];  Service: Orthopedics;  Laterality: Right;    Family History  Problem Relation Age of Onset   Cancer Mother        lung   Heart disease Father    Hearing loss Sister        coclear implant   Hearing loss Brother    Cancer Paternal Grandmother    Cancer Son        colon cancer   Colon cancer Neg Hx    Stomach cancer Neg Hx     Social History   Tobacco Use   Smoking status: Former    Current packs/day: 0.00    Average packs/day: 1.5 packs/day for 35.0 years (52.5 ttl pk-yrs)    Types:  Cigarettes    Start date: 06/22/1957    Quit date: 06/22/1992    Years since quitting: 32.2   Smokeless tobacco: Former    Quit date: 06/22/1992  Vaping Use   Vaping status: Never Used  Substance Use Topics   Alcohol use: Not Currently    Alcohol/week: 1.0 standard drink of alcohol    Types: 1 Cans of beer per week    Comment: rarely   Drug use: No     No Known Allergies  Health Maintenance  Topic Date Due   Influenza Vaccine  07/26/2024   COVID-19 Vaccine (6 - Moderna risk 2024-25 season) 09/18/2024 (Originally 08/26/2024)   Zoster Vaccines- Shingrix (2 of 2) 12/02/2024 (Originally 03/18/2021)   Medicare Annual Wellness (AWV)  08/23/2025   Pneumococcal Vaccine: 50+ Years  Completed   HPV VACCINES  Aged Out   Meningococcal B Vaccine  Aged Out   DTaP/Tdap/Td  Discontinued   Hepatitis C Screening  Discontinued    Chart Review Today: I personally reviewed active problem list, medication list, allergies, family history, social history, health maintenance, notes from last encounter, lab results, imaging with the patient/caregiver today.   Review of Systems  Constitutional: Negative.   HENT: Negative.    Eyes: Negative.   Respiratory: Negative.    Cardiovascular: Negative.   Gastrointestinal: Negative.   Endocrine: Negative.   Genitourinary: Negative.   Musculoskeletal: Negative.   Skin: Negative.   Allergic/Immunologic: Negative.   Neurological: Negative.   Hematological: Negative.   Psychiatric/Behavioral: Negative.    All other systems reviewed and are negative.    Objective:   Vitals:   09/02/24 1335  BP: 120/72  Pulse: 86  Resp: 16  SpO2: 97%  Weight: 217 lb (98.4 kg)  Height: 5' 10 (1.778 m)    Body mass index is 31.14 kg/m.  Physical Exam Vitals and nursing note reviewed.  Constitutional:      General: He is not in acute distress.    Appearance: Normal appearance. He is well-developed. He is not ill-appearing, toxic-appearing or diaphoretic.      Comments: Elderly well appearing  HENT:     Head: Normocephalic and atraumatic.     Right Ear: External ear normal.     Left Ear: External ear normal.     Nose: Nose normal.  Eyes:     General: No scleral icterus.       Right eye: No discharge.        Left eye: No discharge.     Conjunctiva/sclera: Conjunctivae normal.  Neck:     Trachea: No tracheal deviation.  Cardiovascular:     Rate and Rhythm: Normal rate.     Pulses: Normal pulses.  Pulmonary:     Effort: Pulmonary effort is normal. No respiratory distress.     Breath sounds: Normal breath sounds. No stridor. No wheezing, rhonchi or rales.  Skin:    General: Skin is warm and dry.     Findings: No rash.  Neurological:     Mental Status: He is alert.     Motor: No abnormal muscle tone.     Coordination: Coordination normal.     Gait: Gait normal.  Psychiatric:        Mood and Affect: Mood normal.        Behavior: Behavior normal.      Results for orders placed or performed in visit on 08/28/24  CUP PACEART REMOTE DEVICE CHECK   Collection Time: 08/28/24 11:16 AM  Result Value Ref Range   Date Time Interrogation Session [id number]    Pulse Generator Manufacturer [employer]    Pulse Gen Model 1272 Assurity MRI    Pulse Gen Serial Number [id number]    Clinic Name [facility]    Implantable Pulse Generator Type Implantable Pulse Generator    Implantable Pulse Generator Implant Date 79749578    Implantable Lead Manufacturer OTHER    Implantable Lead Model OEJ8768/41 [facility]    Implantable Lead Serial Number [id number]    Implantable Lead Implant Date 79749578    Implantable Lead Location Detail 1 UNKNOWN    Implantable Lead Special Function LBBB    Implantable Lead Location [id number]    Implantable Lead Connection Status [id number]    Lead Channel Setting Sensing Sensitivity 2.5 mV   Lead Channel Setting Sensing Adaptation Mode Fixed Pacing    Lead Channel Setting Pacing Pulse Width 0.5 ms   Lead Channel Setting Pacing  Amplitude 2.0 V   Lead Channel Status NULL    Lead Channel Impedance Value 430 ohm   Lead Channel Sensing Intrinsic Amplitude 9.4 mV   Lead Channel Pacing Threshold Amplitude 0.5 V   Lead Channel Pacing Threshold Pulse Width 0.5 ms   Battery Status MOS    Battery Remaining Longevity 105 mo   Battery Remaining Percentage 93.0 %   Battery Voltage 3.01 V   Brady Statistic RV Percent Paced 99.0 %      Assessment & Plan:   Mixed hyperlipidemia Assessment & Plan: Due for labs Good med compliance and on crestor  10 for years Labs and refills today Orders: -     Rosuvastatin  Calcium ; Take 1 tablet (10 mg total) by mouth daily.  Dispense: 90 tablet; Refill: 3 -     Lipid panel -     Comprehensive metabolic panel with GFR  Encounter for medication monitoring -     Lipid panel -     Comprehensive metabolic panel with GFR  Chronic cough Discussed his cough concerns - when we discussed options to evaluate such as CXR or PFTs he said it wasn't bothering him that much so for now he will continue to monitor Discussed tx of allergies/post nasal drip and possible GERD as well as airway diseases that may be etiologies of his cough     Return for 6 month with other provider .   Michelene Cower, PA-C 09/02/24 1:47 PM

## 2024-09-02 NOTE — Assessment & Plan Note (Signed)
 Due for labs Good med compliance and on crestor  10 for years Labs and refills today

## 2024-09-03 ENCOUNTER — Ambulatory Visit: Payer: Self-pay | Admitting: Family Medicine

## 2024-09-04 NOTE — Progress Notes (Signed)
 Remote PPM Transmission

## 2024-09-16 ENCOUNTER — Other Ambulatory Visit: Payer: Self-pay

## 2024-09-16 DIAGNOSIS — Z23 Encounter for immunization: Secondary | ICD-10-CM

## 2024-09-16 MED ORDER — COVID-19 MRNA VAC-TRIS(PFIZER) 30 MCG/0.3ML IM SUSY: 0.3000 mL | PREFILLED_SYRINGE | Freq: Once | INTRAMUSCULAR | 0 refills | Status: AC

## 2024-09-29 DIAGNOSIS — M1711 Unilateral primary osteoarthritis, right knee: Secondary | ICD-10-CM | POA: Diagnosis not present

## 2024-10-05 DIAGNOSIS — D6832 Hemorrhagic disorder due to extrinsic circulating anticoagulants: Secondary | ICD-10-CM | POA: Diagnosis not present

## 2024-10-05 DIAGNOSIS — M1711 Unilateral primary osteoarthritis, right knee: Secondary | ICD-10-CM | POA: Diagnosis not present

## 2024-10-31 ENCOUNTER — Telehealth: Payer: Self-pay

## 2024-10-31 ENCOUNTER — Ambulatory Visit: Payer: PPO | Admitting: Dermatology

## 2024-10-31 NOTE — Telephone Encounter (Signed)
 Called lft vm we need more info? Why he needs referral

## 2024-10-31 NOTE — Telephone Encounter (Signed)
 Copied from CRM [id number]. Topic: Referral - Question >> Oct 31, 2024 10:44 AM Pinkey ORN wrote: Reason for CRM: Referral >> Oct 31, 2024 10:46 AM Pinkey ORN wrote: Stephen Stein wife called on behalf of patient, wanting to know if patient can be referred to a  rheumatologist. Patient is requesting to see [place], Elsie, [place].

## 2024-11-14 ENCOUNTER — Other Ambulatory Visit: Payer: Self-pay

## 2024-11-14 ENCOUNTER — Encounter: Payer: Self-pay | Admitting: Internal Medicine

## 2024-11-14 ENCOUNTER — Ambulatory Visit (INDEPENDENT_AMBULATORY_CARE_PROVIDER_SITE_OTHER): Admitting: Internal Medicine

## 2024-11-14 VITALS — BP 130/74 | HR 70 | Temp 97.9°F | Resp 16 | Ht 70.0 in | Wt 215.4 lb

## 2024-11-14 DIAGNOSIS — M5431 Sciatica, right side: Secondary | ICD-10-CM | POA: Diagnosis not present

## 2024-11-14 MED ORDER — METHYLPREDNISOLONE 4 MG PO TBPK: ORAL_TABLET | ORAL | 0 refills | Status: AC

## 2024-11-14 MED ORDER — TIZANIDINE HCL 4 MG PO TABS: 2.0000 mg | ORAL_TABLET | Freq: Every evening | ORAL | 0 refills | Status: DC | PRN
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
## 2024-11-27 ENCOUNTER — Ambulatory Visit

## 2024-11-27 DIAGNOSIS — I4821 Permanent atrial fibrillation: Secondary | ICD-10-CM | POA: Diagnosis not present

## 2024-11-28 LAB — CUP PACEART REMOTE DEVICE CHECK
Battery Remaining Longevity: 103 mo
Battery Remaining Percentage: 91 %
Battery Voltage: 3.01 V
Brady Statistic RV Percent Paced: 99 %
Date Time Interrogation Session: 20251203040023
Implantable Lead Connection Status: 753985
Implantable Lead Implant Date: 20250421
Implantable Lead Location: 753860
Implantable Pulse Generator Implant Date: 20250421
Lead Channel Impedance Value: 430 Ohm
Lead Channel Pacing Threshold Amplitude: 0.5 V
Lead Channel Pacing Threshold Pulse Width: 0.5 ms
Lead Channel Sensing Intrinsic Amplitude: 12 mV
Lead Channel Setting Pacing Amplitude: 2 V
Lead Channel Setting Pacing Pulse Width: 0.5 ms
Lead Channel Setting Sensing Sensitivity: 2.5 mV
Pulse Gen Model: 1272
Pulse Gen Serial Number: 8159260

## 2024-12-13 ENCOUNTER — Ambulatory Visit: Payer: Self-pay | Admitting: Cardiology

## 2024-12-13 ENCOUNTER — Other Ambulatory Visit: Payer: Self-pay | Admitting: Internal Medicine

## 2024-12-13 DIAGNOSIS — M5431 Sciatica, right side: Secondary | ICD-10-CM

## 2024-12-13 NOTE — Telephone Encounter (Signed)
 Copied from CRM [id number]. Topic: Clinical - Medication Refill >> Dec 13, 2024  8:28 AM Stephen Stein wrote: Medication:  tiZANidine  (ZANAFLEX ) 4 MG tablet   3 month request  Has the patient contacted their pharmacy? Yes (Agent: If no, request that the patient contact the pharmacy for the refill. If patient does not wish to contact the pharmacy document the reason why and proceed with request.) (Agent: If yes, when and what did the pharmacy advise?)  This is the patient's preferred pharmacy:   [id number] - [place], [place] - [address] [place] [address] Phone: [phone] Fax: [phone]  Is this the correct pharmacy for this prescription? Yes If no, delete pharmacy and type the correct one.   Has the prescription been filled recently? Yes  Is the patient out of the medication? No.  Has the patient been seen for an appointment in the last year OR does the patient have an upcoming appointment? Yes  Can we respond through MyChart? Yes  Agent: Please be advised that Rx refills may take up to 3 business days. We ask that you follow-up with your pharmacy.

## 2024-12-17 MED ORDER — TIZANIDINE HCL 4 MG PO TABS: 2.0000 mg | ORAL_TABLET | Freq: Every evening | ORAL | 0 refills | Status: DC | PRN

## 2024-12-17 NOTE — Telephone Encounter (Signed)
 Requested medication (s) are due for refill today - yes  Requested medication (s) are on the active medication list -yes  Future visit scheduled -yes  Last refill: 11/14/24 #30  Notes to clinic: non delegated Rx  Requested Prescriptions  Pending Prescriptions Disp Refills   tiZANidine  (ZANAFLEX ) 4 MG tablet 30 tablet 0    Sig: Take 0.5 tablets (2 mg total) by mouth at bedtime as needed for muscle spasms.     Not Delegated - Cardiovascular:  Alpha-2 Agonists - tizanidine  Failed - 12/17/2024 12:04 PM      Failed - This refill cannot be delegated      Passed - Valid encounter within last 6 months    Recent Outpatient Visits           1 month ago Right sided sciatica   [facility] Bernardo Fend, DO   3 months ago Mixed hyperlipidemia   [facility] Leavy Mole, PA-C   5 months ago Foreign body of right ear, initial encounter   [facility] Glenard Mire, MD                 Requested Prescriptions  Pending Prescriptions Disp Refills   tiZANidine  (ZANAFLEX ) 4 MG tablet 30 tablet 0    Sig: Take 0.5 tablets (2 mg total) by mouth at bedtime as needed for muscle spasms.     Not Delegated - Cardiovascular:  Alpha-2 Agonists - tizanidine  Failed - 12/17/2024 12:04 PM      Failed - This refill cannot be delegated      Passed - Valid encounter within last 6 months    Recent Outpatient Visits           1 month ago Right sided sciatica   [facility] Bernardo Fend, DO   3 months ago Mixed hyperlipidemia   [facility] Leavy Mole, PA-C   5 months ago Foreign body of right ear, initial encounter   [facility] Glenard Mire, MD

## 2025-01-05 ENCOUNTER — Other Ambulatory Visit: Payer: Self-pay | Admitting: Internal Medicine

## 2025-01-05 DIAGNOSIS — M5431 Sciatica, right side: Secondary | ICD-10-CM

## 2025-01-06 NOTE — Telephone Encounter (Signed)
 Requested medications are due for refill today.  no  Requested medications are on the active medications list.  yes  Last refill. 12/17/2024 #30 0 rf  Future visit scheduled.   yes  Notes to clinic.  Refill/refusal not delegated.    Requested Prescriptions  Pending Prescriptions Disp Refills   tiZANidine  (ZANAFLEX ) 4 MG tablet [Pharmacy Med Name: TIZANIDINE  HCL 4 MG TABLET] 45 tablet 1    Sig: Take 0.5 tablets (2 mg total) by mouth at bedtime as needed for muscle spasms.     Not Delegated - Cardiovascular:  Alpha-2 Agonists - tizanidine  Failed - 01/06/2025  6:00 PM      Failed - This refill cannot be delegated      Passed - Valid encounter within last 6 months    Recent Outpatient Visits           1 month ago Right sided sciatica   [facility] Bernardo Fend, DO   4 months ago Mixed hyperlipidemia   [facility] Leavy Mole, PA-C   5 months ago Foreign body of right ear, initial encounter   [facility] Glenard Mire, MD

## 2025-02-26 ENCOUNTER — Encounter

## 2025-03-14 ENCOUNTER — Ambulatory Visit: Admitting: Internal Medicine

## 2025-05-28 ENCOUNTER — Encounter

## 2025-08-27 ENCOUNTER — Encounter

## 2025-08-29 ENCOUNTER — Ambulatory Visit

## 2025-11-26 ENCOUNTER — Encounter

## 2026-02-25 ENCOUNTER — Encounter
